# Patient Record
Sex: Female | Born: 1935
Health system: Southern US, Community
[De-identification: ages and names within clinical notes are randomized; demographics above are authoritative.]

## PROBLEM LIST (undated history)

## (undated) DIAGNOSIS — I2510 Atherosclerotic heart disease of native coronary artery without angina pectoris: Secondary | ICD-10-CM

## (undated) DIAGNOSIS — E785 Hyperlipidemia, unspecified: Secondary | ICD-10-CM

## (undated) DIAGNOSIS — I480 Paroxysmal atrial fibrillation: Secondary | ICD-10-CM

## (undated) DIAGNOSIS — E039 Hypothyroidism, unspecified: Secondary | ICD-10-CM

## (undated) DIAGNOSIS — I495 Sick sinus syndrome: Secondary | ICD-10-CM

## (undated) DIAGNOSIS — I10 Essential (primary) hypertension: Secondary | ICD-10-CM

## (undated) DIAGNOSIS — R42 Dizziness and giddiness: Secondary | ICD-10-CM

## (undated) HISTORY — DX: Essential (primary) hypertension: I10

## (undated) HISTORY — DX: Hyperlipidemia, unspecified: E78.5

## (undated) HISTORY — DX: Dizziness and giddiness: R42

## (undated) HISTORY — DX: Hypothyroidism, unspecified: E03.9

## (undated) HISTORY — DX: Paroxysmal atrial fibrillation: I48.0

## (undated) HISTORY — DX: Atherosclerotic heart disease of native coronary artery without angina pectoris: I25.10

## (undated) HISTORY — DX: Sick sinus syndrome: I49.5

## (undated) HISTORY — PX: ABDOMINAL HYSTERECTOMY: SHX81

## (undated) HISTORY — PX: FRACTURE SURGERY: SHX138

## (undated) HISTORY — DX: Hypercalcemia: E83.52

---

## 2002-11-11 ENCOUNTER — Inpatient Hospital Stay (HOSPITAL_COMMUNITY): Admission: AD | Admit: 2002-11-11 | Discharge: 2002-11-13 | Payer: Self-pay | Admitting: Internal Medicine

## 2003-05-22 ENCOUNTER — Inpatient Hospital Stay (HOSPITAL_COMMUNITY): Admission: EM | Admit: 2003-05-22 | Discharge: 2003-05-25 | Payer: Self-pay | Admitting: Internal Medicine

## 2003-05-23 ENCOUNTER — Encounter: Payer: Self-pay | Admitting: Internal Medicine

## 2003-05-24 ENCOUNTER — Encounter: Payer: Self-pay | Admitting: Cardiology

## 2003-05-24 HISTORY — PX: PACEMAKER INSERTION: SHX728

## 2003-05-25 ENCOUNTER — Encounter: Payer: Self-pay | Admitting: Cardiology

## 2004-08-13 ENCOUNTER — Ambulatory Visit: Payer: Self-pay | Admitting: Cardiology

## 2004-08-21 ENCOUNTER — Ambulatory Visit: Payer: Self-pay | Admitting: Internal Medicine

## 2004-09-18 ENCOUNTER — Ambulatory Visit: Payer: Self-pay | Admitting: Cardiology

## 2004-10-16 ENCOUNTER — Ambulatory Visit: Payer: Self-pay | Admitting: Cardiology

## 2004-11-13 ENCOUNTER — Ambulatory Visit: Payer: Self-pay | Admitting: Internal Medicine

## 2004-11-18 ENCOUNTER — Ambulatory Visit: Payer: Self-pay | Admitting: Internal Medicine

## 2004-12-11 ENCOUNTER — Ambulatory Visit: Payer: Self-pay | Admitting: Cardiology

## 2005-01-19 ENCOUNTER — Ambulatory Visit: Payer: Self-pay | Admitting: Cardiology

## 2005-02-15 ENCOUNTER — Ambulatory Visit: Payer: Self-pay | Admitting: Internal Medicine

## 2005-02-17 ENCOUNTER — Ambulatory Visit: Payer: Self-pay | Admitting: Cardiology

## 2005-03-17 ENCOUNTER — Ambulatory Visit: Payer: Self-pay | Admitting: Cardiology

## 2005-04-01 ENCOUNTER — Ambulatory Visit: Payer: Self-pay | Admitting: Internal Medicine

## 2005-04-12 ENCOUNTER — Ambulatory Visit: Payer: Self-pay | Admitting: Cardiology

## 2005-05-17 ENCOUNTER — Ambulatory Visit: Payer: Self-pay

## 2005-05-18 ENCOUNTER — Ambulatory Visit: Payer: Self-pay | Admitting: Cardiology

## 2005-06-17 ENCOUNTER — Ambulatory Visit: Payer: Self-pay | Admitting: Internal Medicine

## 2005-06-18 ENCOUNTER — Ambulatory Visit: Payer: Self-pay | Admitting: Cardiology

## 2005-06-25 ENCOUNTER — Ambulatory Visit: Payer: Self-pay | Admitting: Cardiology

## 2005-07-01 ENCOUNTER — Ambulatory Visit: Payer: Self-pay | Admitting: Cardiology

## 2005-07-23 ENCOUNTER — Ambulatory Visit: Payer: Self-pay | Admitting: Internal Medicine

## 2005-07-29 ENCOUNTER — Ambulatory Visit: Payer: Self-pay | Admitting: Cardiology

## 2005-08-05 ENCOUNTER — Ambulatory Visit: Payer: Self-pay | Admitting: Cardiology

## 2005-09-02 ENCOUNTER — Ambulatory Visit: Payer: Self-pay | Admitting: Cardiology

## 2005-09-15 ENCOUNTER — Ambulatory Visit: Payer: Self-pay | Admitting: Internal Medicine

## 2005-09-30 ENCOUNTER — Ambulatory Visit: Payer: Self-pay | Admitting: Cardiology

## 2005-10-05 ENCOUNTER — Ambulatory Visit: Payer: Self-pay | Admitting: Cardiology

## 2005-10-19 ENCOUNTER — Ambulatory Visit: Payer: Self-pay | Admitting: Cardiology

## 2005-11-16 ENCOUNTER — Ambulatory Visit: Payer: Self-pay | Admitting: Cardiology

## 2005-12-14 ENCOUNTER — Ambulatory Visit: Payer: Self-pay | Admitting: Cardiology

## 2005-12-15 ENCOUNTER — Ambulatory Visit: Payer: Self-pay | Admitting: Internal Medicine

## 2006-01-11 ENCOUNTER — Ambulatory Visit: Payer: Self-pay | Admitting: Cardiology

## 2006-01-25 ENCOUNTER — Ambulatory Visit: Payer: Self-pay | Admitting: Cardiology

## 2006-03-14 ENCOUNTER — Ambulatory Visit: Payer: Self-pay | Admitting: Internal Medicine

## 2006-03-21 ENCOUNTER — Ambulatory Visit: Payer: Self-pay | Admitting: Cardiology

## 2006-04-18 ENCOUNTER — Ambulatory Visit: Payer: Self-pay | Admitting: Cardiology

## 2006-04-28 ENCOUNTER — Ambulatory Visit: Payer: Self-pay | Admitting: Cardiology

## 2006-05-09 ENCOUNTER — Ambulatory Visit: Payer: Self-pay | Admitting: Cardiology

## 2006-05-19 ENCOUNTER — Ambulatory Visit: Payer: Self-pay | Admitting: Cardiology

## 2006-06-06 ENCOUNTER — Ambulatory Visit: Payer: Self-pay | Admitting: Cardiology

## 2006-06-20 ENCOUNTER — Ambulatory Visit: Payer: Self-pay | Admitting: Cardiology

## 2006-07-18 ENCOUNTER — Ambulatory Visit: Payer: Self-pay | Admitting: Cardiology

## 2006-07-28 ENCOUNTER — Ambulatory Visit: Payer: Self-pay | Admitting: Cardiology

## 2006-08-12 ENCOUNTER — Ambulatory Visit: Payer: Self-pay | Admitting: Internal Medicine

## 2006-08-16 ENCOUNTER — Ambulatory Visit: Payer: Self-pay | Admitting: Cardiology

## 2006-09-06 ENCOUNTER — Ambulatory Visit: Payer: Self-pay | Admitting: Cardiology

## 2006-09-09 ENCOUNTER — Ambulatory Visit: Payer: Self-pay | Admitting: Internal Medicine

## 2006-10-04 ENCOUNTER — Ambulatory Visit: Payer: Self-pay | Admitting: Cardiology

## 2006-10-07 ENCOUNTER — Ambulatory Visit: Payer: Self-pay | Admitting: Internal Medicine

## 2006-11-04 ENCOUNTER — Ambulatory Visit: Payer: Self-pay | Admitting: Internal Medicine

## 2006-11-29 ENCOUNTER — Ambulatory Visit: Payer: Self-pay | Admitting: Cardiology

## 2006-12-01 ENCOUNTER — Ambulatory Visit: Payer: Self-pay | Admitting: Cardiology

## 2006-12-02 ENCOUNTER — Ambulatory Visit: Payer: Self-pay | Admitting: Internal Medicine

## 2006-12-19 ENCOUNTER — Ambulatory Visit: Payer: Self-pay | Admitting: Cardiology

## 2006-12-30 ENCOUNTER — Ambulatory Visit: Payer: Self-pay | Admitting: Internal Medicine

## 2007-01-13 ENCOUNTER — Ambulatory Visit: Payer: Self-pay | Admitting: Internal Medicine

## 2007-01-19 ENCOUNTER — Ambulatory Visit: Payer: Self-pay | Admitting: Cardiology

## 2007-01-27 ENCOUNTER — Ambulatory Visit: Payer: Self-pay | Admitting: Internal Medicine

## 2007-01-31 ENCOUNTER — Ambulatory Visit: Payer: Self-pay | Admitting: Cardiology

## 2007-02-06 ENCOUNTER — Ambulatory Visit: Payer: Self-pay | Admitting: Cardiology

## 2007-02-24 ENCOUNTER — Ambulatory Visit: Payer: Self-pay | Admitting: Internal Medicine

## 2007-03-08 ENCOUNTER — Ambulatory Visit: Payer: Self-pay | Admitting: Cardiology

## 2007-03-15 ENCOUNTER — Ambulatory Visit: Payer: Self-pay | Admitting: Physician Assistant

## 2007-03-20 ENCOUNTER — Ambulatory Visit: Payer: Self-pay | Admitting: Cardiology

## 2007-03-24 ENCOUNTER — Ambulatory Visit: Payer: Self-pay | Admitting: Internal Medicine

## 2007-03-29 ENCOUNTER — Ambulatory Visit: Payer: Self-pay | Admitting: Cardiology

## 2007-04-04 ENCOUNTER — Ambulatory Visit: Payer: Self-pay | Admitting: Cardiology

## 2007-04-11 ENCOUNTER — Ambulatory Visit: Payer: Self-pay | Admitting: Cardiology

## 2007-04-21 ENCOUNTER — Ambulatory Visit: Payer: Self-pay | Admitting: Internal Medicine

## 2007-04-26 ENCOUNTER — Ambulatory Visit: Payer: Self-pay | Admitting: Cardiology

## 2007-05-17 ENCOUNTER — Ambulatory Visit: Payer: Self-pay | Admitting: Cardiology

## 2007-05-19 ENCOUNTER — Ambulatory Visit: Payer: Self-pay | Admitting: Internal Medicine

## 2007-06-16 ENCOUNTER — Ambulatory Visit: Payer: Self-pay | Admitting: Internal Medicine

## 2007-06-20 ENCOUNTER — Ambulatory Visit: Payer: Self-pay | Admitting: Cardiology

## 2007-07-10 ENCOUNTER — Ambulatory Visit: Payer: Self-pay | Admitting: Internal Medicine

## 2007-07-14 ENCOUNTER — Ambulatory Visit: Payer: Self-pay | Admitting: Internal Medicine

## 2007-07-18 ENCOUNTER — Ambulatory Visit: Payer: Self-pay | Admitting: Cardiology

## 2007-07-26 ENCOUNTER — Ambulatory Visit: Payer: Self-pay | Admitting: Cardiology

## 2007-08-11 ENCOUNTER — Ambulatory Visit: Payer: Self-pay | Admitting: Internal Medicine

## 2007-08-16 ENCOUNTER — Ambulatory Visit: Payer: Self-pay | Admitting: Cardiology

## 2007-08-21 ENCOUNTER — Ambulatory Visit: Payer: Self-pay | Admitting: Cardiology

## 2007-09-08 ENCOUNTER — Ambulatory Visit: Payer: Self-pay | Admitting: Internal Medicine

## 2007-10-03 ENCOUNTER — Ambulatory Visit: Payer: Self-pay | Admitting: Cardiology

## 2007-10-06 ENCOUNTER — Ambulatory Visit: Payer: Self-pay | Admitting: Internal Medicine

## 2007-10-12 ENCOUNTER — Ambulatory Visit: Payer: Self-pay | Admitting: Cardiology

## 2007-10-16 ENCOUNTER — Ambulatory Visit: Payer: Self-pay | Admitting: Cardiology

## 2007-10-20 ENCOUNTER — Ambulatory Visit: Payer: Self-pay | Admitting: Cardiology

## 2007-11-23 ENCOUNTER — Ambulatory Visit: Payer: Self-pay | Admitting: Cardiology

## 2007-11-24 ENCOUNTER — Encounter: Payer: Self-pay | Admitting: Cardiology

## 2007-11-30 ENCOUNTER — Ambulatory Visit: Payer: Self-pay | Admitting: Cardiology

## 2007-12-15 ENCOUNTER — Ambulatory Visit: Payer: Self-pay | Admitting: Cardiology

## 2007-12-28 ENCOUNTER — Ambulatory Visit: Payer: Self-pay | Admitting: Cardiology

## 2008-01-10 ENCOUNTER — Ambulatory Visit: Payer: Self-pay | Admitting: Internal Medicine

## 2008-01-18 ENCOUNTER — Ambulatory Visit: Payer: Self-pay | Admitting: Internal Medicine

## 2008-01-24 ENCOUNTER — Ambulatory Visit: Payer: Self-pay | Admitting: Cardiology

## 2008-02-28 ENCOUNTER — Ambulatory Visit: Payer: Self-pay | Admitting: Cardiology

## 2008-04-02 ENCOUNTER — Ambulatory Visit: Payer: Self-pay | Admitting: Cardiology

## 2008-04-05 ENCOUNTER — Ambulatory Visit: Payer: Self-pay | Admitting: Internal Medicine

## 2008-04-29 ENCOUNTER — Encounter: Payer: Self-pay | Admitting: Cardiology

## 2008-04-30 ENCOUNTER — Ambulatory Visit: Payer: Self-pay | Admitting: Cardiology

## 2008-05-29 ENCOUNTER — Ambulatory Visit: Payer: Self-pay | Admitting: Cardiology

## 2008-06-25 ENCOUNTER — Ambulatory Visit: Payer: Self-pay | Admitting: Cardiology

## 2008-07-05 ENCOUNTER — Ambulatory Visit: Payer: Self-pay | Admitting: Internal Medicine

## 2008-07-05 ENCOUNTER — Encounter: Payer: Self-pay | Admitting: Physician Assistant

## 2008-07-05 ENCOUNTER — Ambulatory Visit: Payer: Self-pay | Admitting: Cardiology

## 2008-07-11 DIAGNOSIS — I4891 Unspecified atrial fibrillation: Secondary | ICD-10-CM

## 2008-07-11 DIAGNOSIS — I10 Essential (primary) hypertension: Secondary | ICD-10-CM

## 2008-07-11 DIAGNOSIS — I2510 Atherosclerotic heart disease of native coronary artery without angina pectoris: Secondary | ICD-10-CM | POA: Insufficient documentation

## 2008-07-11 DIAGNOSIS — E785 Hyperlipidemia, unspecified: Secondary | ICD-10-CM

## 2008-07-16 ENCOUNTER — Ambulatory Visit: Payer: Self-pay | Admitting: Cardiology

## 2008-08-06 ENCOUNTER — Ambulatory Visit: Payer: Self-pay | Admitting: Cardiology

## 2008-08-16 ENCOUNTER — Ambulatory Visit: Payer: Self-pay | Admitting: Internal Medicine

## 2008-08-27 ENCOUNTER — Ambulatory Visit: Payer: Self-pay | Admitting: Cardiology

## 2008-09-24 ENCOUNTER — Ambulatory Visit: Payer: Self-pay | Admitting: Cardiology

## 2008-10-04 ENCOUNTER — Ambulatory Visit: Payer: Self-pay | Admitting: Internal Medicine

## 2008-10-14 ENCOUNTER — Encounter: Payer: Self-pay | Admitting: Cardiology

## 2008-10-25 ENCOUNTER — Ambulatory Visit: Payer: Self-pay | Admitting: Cardiology

## 2008-11-07 ENCOUNTER — Encounter: Payer: Self-pay | Admitting: Cardiology

## 2008-11-19 ENCOUNTER — Ambulatory Visit: Payer: Self-pay | Admitting: Cardiology

## 2008-12-24 ENCOUNTER — Ambulatory Visit: Payer: Self-pay | Admitting: Cardiology

## 2009-01-03 ENCOUNTER — Ambulatory Visit: Payer: Self-pay | Admitting: Internal Medicine

## 2009-01-03 ENCOUNTER — Encounter (INDEPENDENT_AMBULATORY_CARE_PROVIDER_SITE_OTHER): Payer: Self-pay | Admitting: Radiology

## 2009-01-21 ENCOUNTER — Ambulatory Visit: Payer: Self-pay | Admitting: Cardiology

## 2009-02-07 ENCOUNTER — Encounter: Payer: Self-pay | Admitting: Cardiology

## 2009-02-08 ENCOUNTER — Encounter: Payer: Self-pay | Admitting: Cardiology

## 2009-02-18 ENCOUNTER — Ambulatory Visit: Payer: Self-pay | Admitting: Cardiology

## 2009-02-28 ENCOUNTER — Ambulatory Visit: Payer: Self-pay | Admitting: Internal Medicine

## 2009-03-18 ENCOUNTER — Ambulatory Visit: Payer: Self-pay | Admitting: Cardiology

## 2009-03-19 ENCOUNTER — Encounter: Payer: Self-pay | Admitting: Cardiology

## 2009-04-04 ENCOUNTER — Encounter: Payer: Self-pay | Admitting: Cardiology

## 2009-04-04 ENCOUNTER — Encounter: Payer: Self-pay | Admitting: Internal Medicine

## 2009-04-04 ENCOUNTER — Ambulatory Visit: Payer: Self-pay | Admitting: Internal Medicine

## 2009-04-15 ENCOUNTER — Ambulatory Visit: Payer: Self-pay

## 2009-05-12 ENCOUNTER — Encounter: Payer: Self-pay | Admitting: Cardiology

## 2009-05-12 ENCOUNTER — Encounter: Payer: Self-pay | Admitting: *Deleted

## 2009-05-13 ENCOUNTER — Ambulatory Visit: Payer: Self-pay | Admitting: Cardiology

## 2009-05-13 LAB — CONVERTED CEMR LAB
POC INR: 2.3
Prothrombin Time: 18.4 s

## 2009-06-10 ENCOUNTER — Ambulatory Visit: Payer: Self-pay | Admitting: Cardiology

## 2009-07-04 ENCOUNTER — Ambulatory Visit: Payer: Self-pay | Admitting: Internal Medicine

## 2009-07-08 ENCOUNTER — Ambulatory Visit: Payer: Self-pay | Admitting: Cardiology

## 2009-07-17 ENCOUNTER — Encounter: Payer: Self-pay | Admitting: Cardiology

## 2009-07-18 ENCOUNTER — Encounter: Payer: Self-pay | Admitting: Cardiology

## 2009-08-05 ENCOUNTER — Ambulatory Visit: Payer: Self-pay | Admitting: Cardiology

## 2009-08-12 ENCOUNTER — Ambulatory Visit: Payer: Self-pay | Admitting: Cardiology

## 2009-08-12 ENCOUNTER — Encounter (INDEPENDENT_AMBULATORY_CARE_PROVIDER_SITE_OTHER): Payer: Self-pay | Admitting: *Deleted

## 2009-08-12 DIAGNOSIS — I495 Sick sinus syndrome: Secondary | ICD-10-CM | POA: Insufficient documentation

## 2009-08-12 DIAGNOSIS — R0989 Other specified symptoms and signs involving the circulatory and respiratory systems: Secondary | ICD-10-CM

## 2009-08-12 DIAGNOSIS — E039 Hypothyroidism, unspecified: Secondary | ICD-10-CM | POA: Insufficient documentation

## 2009-08-12 DIAGNOSIS — Z950 Presence of cardiac pacemaker: Secondary | ICD-10-CM

## 2009-08-12 DIAGNOSIS — R0609 Other forms of dyspnea: Secondary | ICD-10-CM

## 2009-08-18 ENCOUNTER — Encounter: Payer: Self-pay | Admitting: Cardiology

## 2009-08-18 ENCOUNTER — Ambulatory Visit: Payer: Self-pay | Admitting: Cardiology

## 2009-08-26 ENCOUNTER — Encounter (INDEPENDENT_AMBULATORY_CARE_PROVIDER_SITE_OTHER): Payer: Self-pay | Admitting: *Deleted

## 2009-09-02 ENCOUNTER — Ambulatory Visit: Payer: Self-pay | Admitting: Cardiology

## 2009-09-02 LAB — CONVERTED CEMR LAB: POC INR: 2.2

## 2009-09-23 ENCOUNTER — Ambulatory Visit: Payer: Self-pay | Admitting: Cardiology

## 2009-09-25 ENCOUNTER — Encounter: Payer: Self-pay | Admitting: Cardiology

## 2009-09-30 ENCOUNTER — Ambulatory Visit: Payer: Self-pay | Admitting: Cardiology

## 2009-09-30 LAB — CONVERTED CEMR LAB: POC INR: 2

## 2009-10-01 ENCOUNTER — Ambulatory Visit: Payer: Self-pay | Admitting: Internal Medicine

## 2009-10-28 ENCOUNTER — Ambulatory Visit: Payer: Self-pay | Admitting: Cardiology

## 2009-10-28 LAB — CONVERTED CEMR LAB: POC INR: 2.4

## 2009-11-25 ENCOUNTER — Ambulatory Visit: Payer: Self-pay | Admitting: Cardiology

## 2009-12-02 ENCOUNTER — Encounter: Payer: Self-pay | Admitting: Cardiology

## 2009-12-23 ENCOUNTER — Ambulatory Visit: Payer: Self-pay | Admitting: Cardiology

## 2009-12-23 LAB — CONVERTED CEMR LAB: POC INR: 2.5

## 2010-01-02 ENCOUNTER — Ambulatory Visit: Payer: Self-pay | Admitting: Internal Medicine

## 2010-01-20 ENCOUNTER — Encounter: Payer: Self-pay | Admitting: Cardiology

## 2010-01-23 ENCOUNTER — Ambulatory Visit: Payer: Self-pay | Admitting: Cardiology

## 2010-02-16 ENCOUNTER — Encounter: Payer: Self-pay | Admitting: Cardiology

## 2010-02-20 ENCOUNTER — Encounter: Payer: Self-pay | Admitting: Cardiology

## 2010-02-24 ENCOUNTER — Ambulatory Visit: Payer: Self-pay | Admitting: Cardiology

## 2010-03-04 ENCOUNTER — Ambulatory Visit: Payer: Self-pay | Admitting: Cardiology

## 2010-03-06 ENCOUNTER — Encounter: Payer: Self-pay | Admitting: Cardiology

## 2010-03-24 ENCOUNTER — Ambulatory Visit: Payer: Self-pay | Admitting: Cardiology

## 2010-03-24 LAB — CONVERTED CEMR LAB: POC INR: 2.4

## 2010-04-21 ENCOUNTER — Encounter: Payer: Self-pay | Admitting: Cardiology

## 2010-04-24 ENCOUNTER — Ambulatory Visit: Payer: Self-pay | Admitting: Cardiology

## 2010-04-27 ENCOUNTER — Ambulatory Visit: Payer: Self-pay | Admitting: Internal Medicine

## 2010-04-28 ENCOUNTER — Encounter: Payer: Self-pay | Admitting: Internal Medicine

## 2010-05-22 ENCOUNTER — Ambulatory Visit: Payer: Self-pay | Admitting: Cardiology

## 2010-06-10 ENCOUNTER — Encounter (INDEPENDENT_AMBULATORY_CARE_PROVIDER_SITE_OTHER): Payer: Self-pay | Admitting: *Deleted

## 2010-06-19 ENCOUNTER — Ambulatory Visit: Payer: Self-pay | Admitting: Cardiology

## 2010-06-26 ENCOUNTER — Ambulatory Visit: Payer: Self-pay | Admitting: Internal Medicine

## 2010-06-27 ENCOUNTER — Encounter: Payer: Self-pay | Admitting: Cardiology

## 2010-06-28 ENCOUNTER — Encounter: Payer: Self-pay | Admitting: Cardiology

## 2010-07-21 ENCOUNTER — Ambulatory Visit: Payer: Self-pay | Admitting: Cardiology

## 2010-07-21 LAB — CONVERTED CEMR LAB: POC INR: 2.6

## 2010-08-18 ENCOUNTER — Ambulatory Visit: Payer: Self-pay | Admitting: Cardiology

## 2010-08-31 ENCOUNTER — Ambulatory Visit: Payer: Self-pay | Admitting: Internal Medicine

## 2010-09-15 ENCOUNTER — Ambulatory Visit: Payer: Self-pay | Admitting: Cardiology

## 2010-09-15 LAB — CONVERTED CEMR LAB: POC INR: 2.9

## 2010-10-06 ENCOUNTER — Ambulatory Visit: Admission: RE | Admit: 2010-10-06 | Discharge: 2010-10-06 | Payer: Self-pay | Source: Home / Self Care

## 2010-10-06 ENCOUNTER — Encounter: Payer: Self-pay | Admitting: Cardiology

## 2010-10-06 ENCOUNTER — Ambulatory Visit
Admission: RE | Admit: 2010-10-06 | Discharge: 2010-10-06 | Payer: Self-pay | Source: Home / Self Care | Attending: Cardiology | Admitting: Cardiology

## 2010-10-07 ENCOUNTER — Encounter: Payer: Self-pay | Admitting: Cardiology

## 2010-10-16 ENCOUNTER — Encounter (INDEPENDENT_AMBULATORY_CARE_PROVIDER_SITE_OTHER): Payer: Self-pay | Admitting: *Deleted

## 2010-10-27 NOTE — Medication Information (Signed)
Summary: ccr-lr  Anticoagulant Therapy  Managed by: Vashti Hey, RN PCP: Lucianne Muss MD: Andee Lineman MD, Michelle Piper Indication 1: Atrial Fibrillation (ICD-427.31) Lab [facility] [place] Site: [facility] INR POC 2.5  Dietary changes: no    Health status changes: no    Bleeding/hemorrhagic complications: no    Recent/future hospitalizations: no    Any changes in medication regimen? no    Recent/future dental: no  Any missed doses?: no       Is patient compliant with meds? yes       Allergies: 1)  ! * Statins  Anticoagulation Management History:      The patient is taking warfarin and comes in today for a routine follow up visit.  Positive risk factors for bleeding include an age of 75 years or older.  The bleeding index is 'intermediate risk'.  Positive CHADS2 values include History of HTN and Age > 75 years old.  The start date was 02/17/2005.  Anticoagulation responsible provider: Andee Lineman MD, Michelle Piper.  INR POC: 2.5.  Cuvette Lot#: [id number].  Exp: 10/11.    Anticoagulation Management Assessment/Plan:      The patient's current anticoagulation dose is Warfarin sodium 2.5 mg tabs: Take 1 tablet by mouth as directed.  The target INR is 2 - 3.  The next INR is due 01/20/2010.  Anticoagulation instructions were given to patient.  Results were reviewed/authorized by Vashti Hey, RN.  She was notified by Vashti Hey RN.         Prior Anticoagulation Instructions: INR 2.1 Continue coumadin 2.5mg  once daily except 1.25mg  on Thursdays and Saturdays  Current Anticoagulation Instructions: INR 2.5 Continue coumadin 2.5mg  once daily except 1.25mg  on Thursdays and Saturdays

## 2010-10-27 NOTE — Medication Information (Signed)
Summary: CCR-R/S NO SHOW  Anticoagulant Therapy  Managed by: Vashti Hey, RN PCP: Lucianne Muss MD: Diona Browner MD, Remi Deter Indication 1: Atrial Fibrillation (ICD-427.31) Lab [facility] [place] Site: [facility] INR POC 2.4  Dietary changes: no    Health status changes: no    Bleeding/hemorrhagic complications: no    Recent/future hospitalizations: no    Any changes in medication regimen? no    Recent/future dental: no  Any missed doses?: no       Is patient compliant with meds? yes       Allergies: 1)  ! * Statins  Anticoagulation Management History:      The patient is taking warfarin and comes in today for a routine follow up visit.  Positive risk factors for bleeding include an age of 75 years or older.  The bleeding index is 'intermediate risk'.  Positive CHADS2 values include History of HTN and Age > 75 years old.  The start date was 02/17/2005.  Anticoagulation responsible provider: Diona Browner MD, Remi Deter.  INR POC: 2.4.  Cuvette Lot#: [id number].  Exp: 10/11.    Anticoagulation Management Assessment/Plan:      The patient's current anticoagulation dose is Warfarin sodium 2.5 mg tabs: Take 1 tablet by mouth as directed.  The target INR is 2 - 3.  The next INR is due 05/22/2010.  Anticoagulation instructions were given to patient.  Results were reviewed/authorized by Vashti Hey, RN.  She was notified by Vashti Hey RN.         Prior Anticoagulation Instructions: INR 2.4 Continue coumadin 2.5mg  once daily except 1.25mg  on Thursdays and Saturdays  Current Anticoagulation Instructions: INR 2.4 Continue couamdin 2.5mg  once daily except 1.25mg  on Th, Sat

## 2010-10-27 NOTE — Cardiovascular Report (Signed)
Summary: [facility]/ INTERROGATION REPORT  [facility]/ INTERROGATION REPORT   Imported By: Dorise Hiss 06/29/2010 11:56:00  _____________________________________________________________________  External Attachment:    Type:   Image     Comment:   External Document

## 2010-10-27 NOTE — Cardiovascular Report (Signed)
Summary: TTM   TTM   Imported By: Roderic Ovens 01/14/2010 13:08:56  _____________________________________________________________________  External Attachment:    Type:   Image     Comment:   External Document

## 2010-10-27 NOTE — Medication Information (Signed)
Summary: ccr-lr  Anticoagulant Therapy  Managed by: Vashti Hey, RN PCP: Lucianne Muss MD: Antoine Poche MD, Fayrene Fearing Indication 1: Atrial Fibrillation (ICD-427.31) Lab Used: [facility] [place] Site: [facility] INR POC 2.3  Dietary changes: no    Health status changes: no    Bleeding/hemorrhagic complications: no    Recent/future hospitalizations: no    Any changes in medication regimen? no    Recent/future dental: no  Any missed doses?: no       Is patient compliant with meds? yes       Allergies: 1)  ! * Statins  Anticoagulation Management History:      The patient is taking warfarin and comes in today for a routine follow up visit.  Positive risk factors for bleeding include an age of 75 years or older.  The bleeding index is 'intermediate risk'.  Positive CHADS2 values include History of HTN and Age > 53 years old.  The start date was 02/17/2005.  Anticoagulation responsible provider: Antoine Poche MD, Fayrene Fearing.  INR POC: 2.3.  Cuvette Lot#: [id number].  Exp: 10/11.    Anticoagulation Management Assessment/Plan:      The patient's current anticoagulation dose is Warfarin sodium 2.5 mg tabs: Take 1 tablet by mouth as directed.  The target INR is 2 - 3.  The next INR is due 03/24/2010.  Anticoagulation instructions were given to patient.  Results were reviewed/authorized by Vashti Hey, RN.  She was notified by Vashti Hey RN.         Prior Anticoagulation Instructions: INR 2.2 Continue coumadin 2.5mg  once daily except 1.25mg  on Thursdays and Saturdays  Current Anticoagulation Instructions: INR 2.3 Continue coumadin 2.5mg  once daily except 1.25mg  on Thursdays and Saturdays

## 2010-10-27 NOTE — Letter (Signed)
Summary: Appointment- Rescheduled  [facility] [address]   [place], [place] [address]   Phone: [phone]  Fax: [phone]     June 10, 2010 MRN: [id number]     Melanie [name] [address] [place], [place]  [address]     Dear Melanie [name],   Due to a change in our office schedule, your appointment time on   Sept 23, 2011 at 11:15 must be changed to Sept 23, 2011 at 3:45.   We look forward to participating in your health care needs.      Sincerely,  Glass blower/designer

## 2010-10-27 NOTE — Medication Information (Signed)
Summary: ccr-lr  Anticoagulant Therapy  Managed by: Vashti Hey, RN PCP: Lucianne Muss MD: Andee Lineman MD, Michelle Piper Indication 1: Atrial Fibrillation (ICD-427.31) Lab Used: [facility] [place] Site: [facility] INR POC 2.4  Dietary changes: no    Health status changes: no    Bleeding/hemorrhagic complications: no    Recent/future hospitalizations: no    Any changes in medication regimen? no    Recent/future dental: no  Any missed doses?: no       Is patient compliant with meds? yes       Allergies: 1)  ! * Statins  Anticoagulation Management History:      The patient is taking warfarin and comes in today for a routine follow up visit.  Positive risk factors for bleeding include an age of 75 years or older.  The bleeding index is 'intermediate risk'.  Positive CHADS2 values include History of HTN and Age > 25 years old.  The start date was 02/17/2005.  Anticoagulation responsible provider: Andee Lineman MD, Michelle Piper.  INR POC: 2.4.  Cuvette Lot#: [id number].  Exp: 10/11.    Anticoagulation Management Assessment/Plan:      The patient's current anticoagulation dose is Warfarin sodium 2.5 mg tabs: Take 1 tablet by mouth as directed.  The target INR is 2 - 3.  The next INR is due 11/25/2009.  Anticoagulation instructions were given to patient.  Results were reviewed/authorized by Vashti Hey, RN.  She was notified by Vashti Hey RN.         Prior Anticoagulation Instructions: INR 2.0 Take coumadin 3.75mg  tonight then resume 2.5mg  once daily except 1.25mg  on Th,Sat  Current Anticoagulation Instructions: INR 2.4 Continue coumadin 2.5mg  once daily except 1.25mg  on Th,Sat

## 2010-10-27 NOTE — Cardiovascular Report (Signed)
Summary: TTM   TTM   Imported By: Roderic Ovens 05/19/2010 11:21:05  _____________________________________________________________________  External Attachment:    Type:   Image     Comment:   External Document

## 2010-10-27 NOTE — Medication Information (Signed)
Summary: ccr-lr  Anticoagulant Therapy  Managed by: Vashti Hey, RN PCP: Lucianne Muss MD: Andee Lineman MD, Michelle Piper Indication 1: Atrial Fibrillation (ICD-427.31) Lab [facility]: [facility] [place] Site: [facility] INR POC 2.1  Dietary changes: no    Health status changes: yes       Details: stomach virus  Bleeding/hemorrhagic complications: no    Recent/future hospitalizations: no    Any changes in medication regimen? yes       Details: lomotil and phenergan for stomach virus  Recent/future dental: no  Any missed doses?: no       Is patient compliant with meds? yes       Allergies: 1)  ! * Statins  Anticoagulation Management History:      The patient is taking warfarin and comes in today for a routine follow up visit.  Positive risk factors for bleeding include an age of 30 years or older.  The bleeding index is 'intermediate risk'.  Positive CHADS2 values include History of HTN and Age > 56 years old.  The start date was 02/17/2005.  Anticoagulation responsible provider: Andee Lineman MD, Michelle Piper.  INR POC: 2.1.  Cuvette Lot#: [id number].  Exp: 10/11.    Anticoagulation Management Assessment/Plan:      The patient's current anticoagulation dose is Warfarin sodium 2.5 mg tabs: Take 1 tablet by mouth as directed.  The target INR is 2 - 3.  The next INR is due 12/23/2009.  Anticoagulation instructions were given to patient.  Results were reviewed/authorized by Vashti Hey, RN.  She was notified by Vashti Hey RN.         Prior Anticoagulation Instructions: INR 2.4 Continue coumadin 2.5mg  once daily except 1.25mg  on Th,Sat  Current Anticoagulation Instructions: INR 2.1 Continue coumadin 2.5mg  once daily except 1.25mg  on Thursdays and Saturdays

## 2010-10-27 NOTE — Assessment & Plan Note (Signed)
Summary: 6 mo fu reminder-srs   Visit Type:  Follow-up Primary Provider:  Sherril Croon   History of Present Illness: the patient is an 77 year old female with history of coronary artery disease. She underwent a prior evaluation for dyspnea. She had a stress test would notice for ischemia. Also pulmonary function tests were relatively normal with a slight decrease in DLCO likely secondary to amiodarone. Interestingly the patient's dyspnea has markedly improved. She reports no chest pain orthopnea PND. She was found recently to have a cyst on her kidney. She also the CT scan of the neck for soft tissue mass but no definite malignancy was found. CT Scan of the chest showed some mild scarring in the lung tissue. Overall she states that from a cardiovascular standpoint is doing quite well. She also has a pacemaker and reports no complications.  Preventive Screening-Counseling & Management  Alcohol-Tobacco     Smoking Status: never  Current Medications (verified): 1)  Amiodarone Hcl 200 Mg Tabs (Amiodarone Hcl) .... Take 1/2 Tablet By Mouth Once A Day 2)  Amlodipine Besylate 10 Mg Tabs (Amlodipine Besylate) .... Take 1/2 Tablet By Mouth Once A Day 3)  Aspirin 81 Mg Tabs (Aspirin) .... Take 1 Tablet By Mouth Once A Day 4)  Crestor 5 Mg Tabs (Rosuvastatin Calcium) .... Take 1 Tablet By Mouth Every Night 5)  Isosorbide Mononitrate Cr 30 Mg Xr24h-Tab (Isosorbide Mononitrate) .... Take 1/2 Tablet By Mouth At Bedtime 6)  Levothyroxine Sodium 75 Mcg Tabs (Levothyroxine Sodium) .... Take 1 Tablet By Mouth Once A Day 7)  Warfarin Sodium 2.5 Mg Tabs (Warfarin Sodium) .... Take 1 Tablet By Mouth As Directed 8)  Zetia 10 Mg Tabs (Ezetimibe) .... Take 1 Tablet By Mouth Once A Day 9)  Metoprolol Tartrate 25 Mg Tabs (Metoprolol Tartrate) .... Take 1 Tablet By Mouth Two Times A Day 10)  Protonix 40 Mg Tbec (Pantoprazole Sodium) .... Take 1 Tablet By Mouth Once A Day 11)  Vitamin B-12 250 Mcg Tabs (Cyanocobalamin) ....  Take 1 Tablet By Mouth Once A Day 12)  Fish Oil 1000 Mg Caps (Omega-3 Fatty Acids) .... Take 1 Tablet By Mouth Two Times A Day 13)  Evista 60 Mg Tabs (Raloxifene Hcl) .... Take 1 Tablet By Mouth Once A Day 14)  Clonazepam 0.5 Mg Tabs (Clonazepam) .... Take 1 Tablet By Mouth As Needed Sleep  Allergies: 1)  ! * Statins  Comments:  Nurse/Medical Assistant: The patient's medications were reviewed with the patient and were updated in the Medication List. Pt brought medication bottles to office visit.  Cyril Loosen, RN, BSN (March 04, 2010 1:38 PM)  Past History:  Past Medical History: Last updated: 08/12/2009 1. Dizziness, resolved. 2. Coronary artery disease. 3. History of paroxysmal atrial fibrillation in normal sinus rhythm on     amiodarone with atrial pacing. 4. Sinus node dysfunction status post Medtronic dual-chamber     pacemaker. 5. History of hypercalcemia resolved post discontinuation of     supplemental calcium. 6. Hypertension. 7. Dyslipidemia.   1. Two-vessel coronary artery disease, quiescent.     a.     70% left anterior descending artery disease and well      collateralized, 100% right coronary artery by cardiac      catheterization in 2004.     b.     Low-risk adenosine Cardiolite with noted ischemia in small      area of mid inferior wall; ejection fraction 70%, October 2008. 2. Normal ventricular function. 3. History  of paroxysmal atrial fibrillation.     a.     Maintaining his normal sinus rhythm, on amiodarone.     b.     Chronic Coumadin. 4. Sinus node dysfunction.     a.     Status post Medtronic dual-chamber pacemaker, followed by      Dr. [name]. 5. Hypertension, stable. 6. Dyslipidemia, well controlled. 7. Hypothyroidism.   Family History: Last updated: 08/12/2009 noncontributory  Social History: Last updated: 08/12/2009 Tobacco Use - No.   Risk Factors: Smoking Status: never (03/04/2010)  Past Cardiac History:  Recent  Diagnostics: IMPRESSION: 1. Paroxysmal atrial fibrillation. 2. Sinus node dysfunction, status post pacemaker implantation. 3. Ischemic heart disease with prior percutaneous coronary     intervention and normal left ventricular function. 4. Dyspnea on exertion - recently worsening, question anginal     equivalent. 5. Amiodarone for the above.   Ms. Pistole pacemaker is functioning normally.  I am a little worried about her dyspnea on exertion, if it continues to be a problem, I have asked her to let us know and we may need to undertake Myoview scanning.   In addition, it is possible that reprogramming of her device might ameliorate some of these symptoms as well.   I trust that she will be getting her amiodarone, surveillance laboratories, and thyroid adjustments by Dr. Sherril Croon.   We will see her again in 6 months' time.  Review of Systems  The patient denies fatigue, malaise, fever, weight gain/loss, vision loss, decreased hearing, hoarseness, chest pain, palpitations, shortness of breath, prolonged cough, wheezing, sleep apnea, coughing up blood, abdominal pain, blood in stool, nausea, vomiting, diarrhea, heartburn, incontinence, blood in urine, muscle weakness, joint pain, leg swelling, rash, skin lesions, headache, fainting, dizziness, depression, anxiety, enlarged lymph nodes, easy bruising or bleeding, and environmental allergies.    Vital Signs:  Patient profile:   77 year old female Height:      63 inches Weight:      160 pounds Pulse rate:   71 / minute BP sitting:   145 / 76  (left arm) Cuff size:   regular  Vitals Entered By: Cyril Loosen, RN, BSN (March 04, 2010 1:33 PM) Comments Follow up office visit   Physical Exam  Additional Exam:  General: Well-developed, well-nourished in no distress head: Normocephalic and atraumatic eyes PERRLA/EOMI intact, conjunctiva and lids normal nose: No deformity or lesions mouth normal dentition, normal posterior pharynx neck:  Supple, no JVD.  No masses, thyromegaly or abnormal cervical nodes lungs: Normal breath sounds bilaterally without wheezing.  Normal percussion heart: regular rate and rhythm with normal S1 and S2, no S3 or S4.  PMI is normal.  No pathological murmurs abdomen: Normal bowel sounds, abdomen is soft and nontender without masses, organomegaly or hernias noted.  No hepatosplenomegaly musculoskeletal: Back normal, normal gait muscle strength and tone normal pulsus: Pulse is normal in all 4 extremities Extremities: No peripheral pitting edema neurologic: Alert and oriented x 3 skin: Intact without lesions or rashes cervical nodes: No significant adenopathy psychologic: Normal affect    EKG  Procedure date:  03/04/2010  Findings:      atrial pacing heart rate 78 beats per minute  PPM Specifications Following MD:  Hillis Range, MD     PPM Vendor:  Medtronic     PPM Model Number:  [id number]     PPM Serial Number:  [id number] PPM DOI:  05/24/2003     PPM Implanting MD:  [name], MD  Lead 1    Location: [facility]     DOI: 05/24/2003     Model #: [id number]     Serial #: [id number]     Status: active Lead 2    Location: RV     DOI: 05/24/2003     Model #: [id number]     Serial #: [id number]     Status: active   Indications:  PAF   PPM Follow Up Pacer Dependent:  No      Episodes Coumadin:  Yes  Parameters Mode:  DDIR     Lower Rate Limit:  60     Upper Rate Limit:  105 Paced AV Delay:  300     Impression & Recommendations:  Problem # 1:  SINOATRIAL NODE DYSFUNCTION (ICD-427.81) the patient status post pacemaker placement. Normal pacemaker function. Her updated medication list for this problem includes:    Amiodarone Hcl 200 Mg Tabs (Amiodarone hcl) .Marland Kitchen... Take 1/2 tablet by mouth once a day    Amlodipine Besylate 10 Mg Tabs (Amlodipine besylate) .Marland Kitchen... Take 1/2 tablet by mouth once a day    Aspirin 81 Mg Tabs (Aspirin) .Marland Kitchen... Take 1 tablet by mouth once a day    Isosorbide Mononitrate Cr 30 Mg Xr24h-tab  (Isosorbide mononitrate) .Marland Kitchen... Take 1/2 tablet by mouth at bedtime    Warfarin Sodium 2.5 Mg Tabs (Warfarin sodium) .Marland Kitchen... Take 1 tablet by mouth as directed    Metoprolol Tartrate 25 Mg Tabs (Metoprolol tartrate) .Marland Kitchen... Take 1 tablet by mouth two times a day  Problem # 2:  DYSPNEA ON EXERTION (ICD-786.09) dyspnea has significantly improved. The patient has mild scarring of the lung tissue by CT scan. We'll continue current medical regimen. She had pulmonary function test done and they do not need to be repeated. Her updated medication list for this problem includes:    Amlodipine Besylate 10 Mg Tabs (Amlodipine besylate) .Marland Kitchen... Take 1/2 tablet by mouth once a day    Aspirin 81 Mg Tabs (Aspirin) .Marland Kitchen... Take 1 tablet by mouth once a day    Metoprolol Tartrate 25 Mg Tabs (Metoprolol tartrate) .Marland Kitchen... Take 1 tablet by mouth two times a day  Problem # 3:  ATRIAL FIBRILLATION, PAROXYSMAL (ICD-427.31) the patient remains in normal sinus rhythm on amiodarone therapy. As a matter of fact she has atrial pacing with normal ventricular conduction. Her updated medication list for this problem includes:    Amiodarone Hcl 200 Mg Tabs (Amiodarone hcl) .Marland Kitchen... Take 1/2 tablet by mouth once a day    Aspirin 81 Mg Tabs (Aspirin) .Marland Kitchen... Take 1 tablet by mouth once a day    Warfarin Sodium 2.5 Mg Tabs (Warfarin sodium) .Marland Kitchen... Take 1 tablet by mouth as directed    Metoprolol Tartrate 25 Mg Tabs (Metoprolol tartrate) .Marland Kitchen... Take 1 tablet by mouth two times a day  Orders: EKG w/ Interpretation (93000) T-TSH (29562-13086) T-Hepatic Function (57846-96295)  Problem # 4:  CAD, NATIVE VESSEL (ICD-414.01) no recurrent chest pain. Continue current medical therapy. Her updated medication list for this problem includes:    Amlodipine Besylate 10 Mg Tabs (Amlodipine besylate) .Marland Kitchen... Take 1/2 tablet by mouth once a day    Aspirin 81 Mg Tabs (Aspirin) .Marland Kitchen... Take 1 tablet by mouth once a day    Isosorbide Mononitrate Cr 30 Mg  Xr24h-tab (Isosorbide mononitrate) .Marland Kitchen... Take 1/2 tablet by mouth at bedtime    Warfarin Sodium 2.5 Mg Tabs (Warfarin sodium) .Marland Kitchen... Take 1 tablet by mouth as directed    Metoprolol Tartrate  25 Mg Tabs (Metoprolol tartrate) .Marland Kitchen... Take 1 tablet by mouth two times a day  Patient Instructions: 1)  Labs:  liver function & thyroid 2)  Follow up in  6 months

## 2010-10-27 NOTE — Medication Information (Signed)
Summary: ccr-srs  Anticoagulant Therapy  Managed by: Vashti Hey, RN PCP: Lucianne Muss MD: Andee Lineman MD, Michelle Piper Indication 1: Atrial Fibrillation (ICD-427.31) Lab [facility] [place] Site: [facility] INR POC 2.0  Dietary changes: no    Health status changes: yes       Details: had chest congestion  Bleeding/hemorrhagic complications: no    Recent/future hospitalizations: no    Any changes in medication regimen? yes       Details: was on z pack   finished 2 days ago  Recent/future dental: no  Any missed doses?: no       Is patient compliant with meds? yes       Allergies: 1)  ! * Statins  Anticoagulation Management History:      The patient is taking warfarin and comes in today for a routine follow up visit.  Positive risk factors for bleeding include an age of 75 years or older.  The bleeding index is 'intermediate risk'.  Positive CHADS2 values include History of HTN and Age > 75 years old.  The start date was 02/17/2005.  Anticoagulation responsible provider: Andee Lineman MD, Michelle Piper.  INR POC: 2.0.  Cuvette Lot#: [id number].  Exp: 10/11.    Anticoagulation Management Assessment/Plan:      The patient's current anticoagulation dose is Warfarin sodium 2.5 mg tabs: Take 1 tablet by mouth as directed.  The target INR is 2 - 3.  The next INR is due 10/28/2009.  Anticoagulation instructions were given to patient.  Results were reviewed/authorized by Vashti Hey, RN.  She was notified by Vashti Hey RN.         Prior Anticoagulation Instructions: Continue coumadin 2.5mg  once daily except 1.25mg  on Thursdays and Saturdays  Current Anticoagulation Instructions: INR 2.0 Take coumadin 3.75mg  tonight then resume 2.5mg  once daily except 1.25mg  on Th,Sat

## 2010-10-27 NOTE — Letter (Signed)
Summary: Appointment -missed  [facility] [address]   [place], [place] [address]   Phone: [phone]  Fax: [phone]     January 20, 2010 MRN: [id number]     Melanie [name] [address] [place], [place]  [address]     Dear Ms. [name],  Our records indicate you missed your appointment on January 20, 2010                        with [facility].   It is very important that we reach you to reschedule this appointment. We look forward to participating in your health care needs.   Please contact us at the number listed above at your earliest convenience to reschedule this appointment.   Sincerely,    Glass blower/designer

## 2010-10-27 NOTE — Medication Information (Signed)
Summary: ccr-lr  Anticoagulant Therapy  Managed by: Vashti Hey, RN PCP: Lucianne Muss MD: Diona Browner MD, Remi Deter Indication 1: Atrial Fibrillation (ICD-427.31) Lab [facility]: [facility] [place] Site: [facility] INR POC 2.6  Dietary changes: no    Health status changes: no    Bleeding/hemorrhagic complications: no    Recent/future hospitalizations: no    Any changes in medication regimen? no    Recent/future dental: no  Any missed doses?: no       Is patient compliant with meds? yes       Allergies: 1)  ! * Statins  Anticoagulation Management History:      The patient is taking warfarin and comes in today for a routine follow up visit.  Positive risk factors for bleeding include an age of 75 years or older.  The bleeding index is 'intermediate risk'.  Positive CHADS2 values include History of HTN and Age > 55 years old.  The start date was 02/17/2005.  Anticoagulation responsible provider: Diona Browner MD, Remi Deter.  INR POC: 2.6.  Cuvette Lot#: [id number].  Exp: 10/11.    Anticoagulation Management Assessment/Plan:      The patient's current anticoagulation dose is Warfarin sodium 2.5 mg tabs: Take 1 tablet by mouth as directed.  The target INR is 2 - 3.  The next INR is due 08/18/2010.  Anticoagulation instructions were given to patient.  Results were reviewed/authorized by Vashti Hey, RN.  She was notified by Vashti Hey RN.         Prior Anticoagulation Instructions: INR 2.3  Continue coumadin 2.5mg  once daily except 1.25mg  on Thursdays and Saturdays  Current Anticoagulation Instructions: INR 2.6 Continue coumadin 2.5mg  once daily except 1.25mg  on Thursdays and Saturdays

## 2010-10-27 NOTE — Medication Information (Signed)
Summary: ccr-lr  Anticoagulant Therapy  Managed by: Vashti Hey, RN PCP: Lucianne Muss MD: Myrtis Ser MD, Tinnie Gens Indication 1: Atrial Fibrillation (ICD-427.31) Lab Used: [facility] [place] Site: [facility] INR POC 2.0  Dietary changes: no    Health status changes: no    Bleeding/hemorrhagic complications: no    Recent/future hospitalizations: no    Any changes in medication regimen? no    Recent/future dental: no  Any missed doses?: yes     Details: missed 1 dose Sunday  Is patient compliant with meds? yes       Allergies: 1)  ! * Statins  Anticoagulation Management History:      The patient is taking warfarin and comes in today for a routine follow up visit.  Positive risk factors for bleeding include an age of 75 years or older.  The bleeding index is 'intermediate risk'.  Positive CHADS2 values include History of HTN and Age > 64 years old.  The start date was 02/17/2005.  Anticoagulation responsible provider: Myrtis Ser MD, Tinnie Gens.  INR POC: 2.0.  Cuvette Lot#: [id number].  Exp: 10/11.    Anticoagulation Management Assessment/Plan:      The patient's current anticoagulation dose is Warfarin sodium 2.5 mg tabs: Take 1 tablet by mouth as directed.  The target INR is 2 - 3.  The next INR is due 06/19/2010.  Anticoagulation instructions were given to patient.  Results were reviewed/authorized by Vashti Hey, RN.  She was notified by Vashti Hey RN.         Prior Anticoagulation Instructions: INR 2.4 Continue couamdin 2.5mg  once daily except 1.25mg  on Th, Sat  Current Anticoagulation Instructions: INR 2.0 Take coumadin 3.75mg  tonight then resume 2.5mg  once daily except 1.25mg  on Thursdays and Saturdays

## 2010-10-27 NOTE — Procedures (Signed)
Summary: pacer check-srs   Current Medications (verified): 1)  Amiodarone Hcl 200 Mg Tabs (Amiodarone Hcl) .... Take 1/2 Tablet By Mouth Once A Day 2)  Amlodipine Besylate 10 Mg Tabs (Amlodipine Besylate) .... Take 1/2 Tablet By Mouth Once A Day 3)  Aspirin 81 Mg Tabs (Aspirin) .... Take 1 Tablet By Mouth Once A Day 4)  Crestor 5 Mg Tabs (Rosuvastatin Calcium) .... Take 1 Tablet By Mouth Every Night 5)  Isosorbide Mononitrate Cr 30 Mg Xr24h-Tab (Isosorbide Mononitrate) .... Take 1/2 Tablet By Mouth At Bedtime 6)  Levothyroxine Sodium 75 Mcg Tabs (Levothyroxine Sodium) .... Take 1 Tablet By Mouth Once A Day 7)  Warfarin Sodium 2.5 Mg Tabs (Warfarin Sodium) .... Take 1 Tablet By Mouth As Directed 8)  Zetia 10 Mg Tabs (Ezetimibe) .... Take 1 Tablet By Mouth Once A Day 9)  Metoprolol Tartrate 25 Mg Tabs (Metoprolol Tartrate) .... Take 1 Tablet By Mouth Two Times A Day 10)  Protonix 40 Mg Tbec (Pantoprazole Sodium) .... Take 1 Tablet By Mouth Once A Day 11)  Vitamin B-12 250 Mcg Tabs (Cyanocobalamin) .... Take 1 Tablet By Mouth Once A Day 12)  Fish Oil 1000 Mg Caps (Omega-3 Fatty Acids) .... Take 1 Tablet By Mouth Two Times A Day 13)  Evista 60 Mg Tabs (Raloxifene Hcl) .... Take 1 Tablet By Mouth Once A Day 14)  Amitiza 8 Mcg Caps (Lubiprostone) .... Take 1 Tablet By Mouth Once A Day 15)  Azithromycin 250 Mg Tabs (Azithromycin) .... Take 1 Tablet By Mouth Once A Day  Allergies (verified): 1)  ! * Statins  PPM Specifications Following MD:  Hillis Range, MD     PPM Vendor:  Medtronic     PPM Model Number:  [id number]     PPM Serial Number:  [id number] PPM DOI:  05/24/2003     PPM Implanting MD:  [name], MD  Lead 1    Location: RA     DOI: 05/24/2003     Model #: [id number]     Serial #: [id number]     Status: active Lead 2    Location: RV     DOI: 05/24/2003     Model #: [id number]     Serial #: [id number]     Status: active   Indications:  PAF   PPM Follow Up Remote Check?  No Battery Voltage:  2.75  V     Battery Est. Longevity:  2.5 YEARS     Pacer Dependent:  No       PPM Device Measurements Atrium  Amplitude: 1 mV, Impedance: 467 ohms, Threshold: 0.5 V at 0.4 msec Right Ventricle  Amplitude: 15.68 mV, Impedance: 762 ohms, Threshold: 1.25 V at 0.64 msec  Episodes MS Episodes:  4     Coumadin:  Yes Ventricular High Rate:  0     Atrial Pacing:  96.9%     Ventricular Pacing:  1.9%  Parameters Mode:  DDIR     Lower Rate Limit:  60     Upper Rate Limit:  105 Paced AV Delay:  300     Next Cardiology Appt Due:  03/27/2010 Tech Comments:  Normal device function.  No changes made today.  Pt does TTM's with mednet.  ROV 6 months Dr Johney Frame. Gypsy Balsam RN BSN  October 01, 2009 10:28 AM

## 2010-10-27 NOTE — Letter (Signed)
Summary: Appointment -missed  [facility] [address]   [place], [place] [address]   Phone: [phone]  Fax: [phone]     April 21, 2010 MRN: [id number]     Melanie [name] [address] [place], [place]  [address]     Dear Ms. CUDMORE,  Our records indicate you missed your appointment on April 21, 2010                        with [facility].   It is very important that we reach you to reschedule this appointment. We look forward to participating in your health care needs.   Please contact us at the number listed above at your earliest convenience to reschedule this appointment.   Sincerely,    Glass blower/designer

## 2010-10-27 NOTE — Medication Information (Signed)
Summary: ccr-lr  Anticoagulant Therapy  Managed by: Vashti Hey, RN PCP: Lucianne Muss MD: Andee Lineman MD, Michelle Piper Indication 1: Atrial Fibrillation (ICD-427.31) Lab Used: [facility] [place] Site: [facility] Care Clinic INR POC 2.4  Dietary changes: no    Health status changes: no    Bleeding/hemorrhagic complications: no    Recent/future hospitalizations: no    Any changes in medication regimen? no    Recent/future dental: no  Any missed doses?: no       Is patient compliant with meds? yes       Allergies: 1)  ! * Statins  Anticoagulation Management History:      The patient is taking warfarin and comes in today for a routine follow up visit.  Positive risk factors for bleeding include an age of 75 years or older.  The bleeding index is 'intermediate risk'.  Positive CHADS2 values include History of HTN and Age > 9 years old.  The start date was 02/17/2005.  Anticoagulation responsible provider: Andee Lineman MD, Michelle Piper.  INR POC: 2.4.  Cuvette Lot#: [id number].  Exp: 10/11.    Anticoagulation Management Assessment/Plan:      The patient's current anticoagulation dose is Warfarin sodium 2.5 mg tabs: Take 1 tablet by mouth as directed.  The target INR is 2 - 3.  The next INR is due 04/21/2010.  Anticoagulation instructions were given to patient.  Results were reviewed/authorized by Vashti Hey, RN.  She was notified by Vashti Hey RN.         Prior Anticoagulation Instructions: INR 2.3 Continue coumadin 2.5mg  once daily except 1.25mg  on Thursdays and Saturdays  Current Anticoagulation Instructions: INR 2.4 Continue coumadin 2.5mg  once daily except 1.25mg  on Thursdays and Saturdays

## 2010-10-27 NOTE — Assessment & Plan Note (Signed)
Summary: 6 MONTH FU   Visit Type:  Pacemaker check Primary Provider:  Vyas   History of Present Illness: The patient presents today for routine electrophysiology followup. She reports doing very well since last being seen in our clinic. The patient denies symptoms of palpitations, chest pain, shortness of breath, orthopnea, PND, lower extremity edema, dizziness, presyncope, syncope, or neurologic sequela. The patient is tolerating medications without difficulties and is otherwise without complaint today.   Preventive Screening-Counseling & Management  Alcohol-Tobacco     Smoking Status: never  Current Medications (verified): 1)  Amiodarone Hcl 200 Mg Tabs (Amiodarone Hcl) .... Take 1/2 Tablet By Mouth Once A Day 2)  Amlodipine Besylate 10 Mg Tabs (Amlodipine Besylate) .... Take 1/2 Tablet By Mouth Once A Day 3)  Aspirin 81 Mg Tabs (Aspirin) .... Take 1 Tablet By Mouth Once A Day 4)  Crestor 5 Mg Tabs (Rosuvastatin Calcium) .... Take 1 Tablet By Mouth Every Night 5)  Isosorbide Mononitrate Cr 30 Mg Xr24h-Tab (Isosorbide Mononitrate) .... Take 1/2 Tablet By Mouth At Bedtime 6)  Levothyroxine Sodium 75 Mcg Tabs (Levothyroxine Sodium) .... Take 1 Tablet By Mouth Once A Day 7)  Warfarin Sodium 2.5 Mg Tabs (Warfarin Sodium) .... Take 1 Tablet By Mouth As Directed 8)  Zetia 10 Mg Tabs (Ezetimibe) .... Take 1 Tablet By Mouth Once A Day 9)  Metoprolol Tartrate 25 Mg Tabs (Metoprolol Tartrate) .... Take 1 Tablet By Mouth Two Times A Day 10)  Protonix 40 Mg Tbec (Pantoprazole Sodium) .... Take 1 Tablet By Mouth Once A Day 11)  Vitamin B-12 250 Mcg Tabs (Cyanocobalamin) .... Take 1 Tablet By Mouth Once A Day 12)  Fish Oil 1000 Mg Caps (Omega-3 Fatty Acids) .... Take 1 Tablet By Mouth Two Times A Day 13)  Evista 60 Mg Tabs (Raloxifene Hcl) .... Take 1 Tablet By Mouth Once A Day 14)  Clonazepam 0.5 Mg Tabs (Clonazepam) .... Take 1 Tablet By Mouth As Needed Sleep  Allergies (verified): 1)  ! *  Statins  Comments:  Nurse/Medical Assistant: The patient's medication list and allergies were reviewed with the patient and were updated in the Medication and Allergy Lists.  Past History:  Past Medical History: Reviewed history from 08/12/2009 and no changes required. 1. Dizziness, resolved. 2. Coronary artery disease. 3. History of paroxysmal atrial fibrillation in normal sinus rhythm on     amiodarone with atrial pacing. 4. Sinus node dysfunction status post Medtronic dual-chamber     pacemaker. 5. History of hypercalcemia resolved post discontinuation of     supplemental calcium. 6. Hypertension. 7. Dyslipidemia.   1. Two-vessel coronary artery disease, quiescent.     a.     70% left anterior descending artery disease and well      collateralized, 100% right coronary artery by cardiac      catheterization in 2004.     b.     Low-risk adenosine Cardiolite with noted ischemia in small      area of mid inferior wall; ejection fraction 70%, October 2008. 2. Normal ventricular function. 3. History of paroxysmal atrial fibrillation.     a.     Maintaining his normal sinus rhythm, on amiodarone.     b.     Chronic Coumadin. 4. Sinus node dysfunction.     a.     Status post Medtronic dual-chamber pacemaker, followed by      Dr. [name]. 5. Hypertension, stable. 6. Dyslipidemia, well controlled. 7. Hypothyroidism.   Past Cardiac History:  Recent Diagnostics: IMPRESSION: 1. Paroxysmal atrial fibrillation. 2. Sinus node dysfunction, status post pacemaker implantation. 3. Ischemic heart disease with prior percutaneous coronary     intervention and normal left ventricular function. 4. Dyspnea on exertion - recently worsening, question anginal     equivalent. 5. Amiodarone for the above.   Ms. Hamme pacemaker is functioning normally.  I am a little worried about her dyspnea on exertion, if it continues to be a problem, I have asked her to let us know and we may need  to undertake Myoview scanning.   In addition, it is possible that reprogramming of her device might ameliorate some of these symptoms as well.   I trust that she will be getting her amiodarone, surveillance laboratories, and thyroid adjustments by Dr. Sherril Croon.   We will see her again in 6 months' time.  Social History: Reviewed history from 08/12/2009 and no changes required. Tobacco Use - No.   Vital Signs:  Patient profile:   77 year old female Height:      63 inches Weight:      160 pounds BMI:     28.45 Pulse rate:   71 / minute BP sitting:   128 / 76  (left arm) Cuff size:   regular  Vitals Entered By: Carlye Grippe (June 26, 2010 3:18 PM)  Nutrition Counseling: Patient's BMI is greater than 25 and therefore counseled on weight management options.  Physical Exam  General:  Well developed, well nourished, in no acute distress. Head:  normocephalic and atraumatic Eyes:  PERRLA/EOM intact; conjunctiva and lids normal. Mouth:  Teeth, gums and palate normal. Oral mucosa normal. Neck:  supple Lungs:  Clear bilaterally to auscultation and percussion. Heart:  RRR Abdomen:  Bowel sounds positive; abdomen soft and non-tender without masses, organomegaly, or hernias noted. No hepatosplenomegaly. Msk:  Back normal, normal gait. Muscle strength and tone normal. Pulses:  pulses normal in all 4 extremities Extremities:  No clubbing or cyanosis. Neurologic:  Alert and oriented x 3.   PPM Specifications Following MD:  Hillis Range, MD     PPM Vendor:  Medtronic     PPM Model Number:  [id number]     PPM Serial Number:  [id number] PPM DOI:  05/24/2003     PPM Implanting MD:  [name], MD  Lead 1    Location: RA     DOI: 05/24/2003     Model #: 1642     Serial #: [id number]     Status: active Lead 2    Location: RV     DOI: 05/24/2003     Model #: [id number]     Serial #: [id number]     Status: active   Indications:  PAF   PPM Follow Up Battery Voltage:  2.73 V     Battery Est.  Longevity:  2 yrs     Pacer Dependent:  No       PPM Device Measurements Atrium  Amplitude: 1.40 mV, Impedance: 457 ohms, Threshold: 0.50 V at 0.40 msec Right Ventricle  Amplitude: 15.68 mV, Impedance: 776 ohms, Threshold: 0.50 V at 0.40 msec  Episodes MS Episodes:  147     Coumadin:  Yes Ventricular High Rate:  0     Atrial Pacing:  98.6%     Ventricular Pacing:  3.5%  Parameters Mode:  DDIR     Lower Rate Limit:  60     Upper Rate Limit:  105 Paced AV Delay:  300  Next Cardiology Appt Due:  11/30/2010 Tech Comments:  147 AHR EPISODES--LONGEST WAS 16 MINUTES. + COUMADIN.  NORMAL DEVICE FUNCTION.  NO CHANGES MADE. MEDNET TTM 3 MTHS. ROV IN 6 MTHS W/DEVICE CLINIC. Vella Kohler  June 26, 2010 3:38 PM MD Comments:  agree  Impression & Recommendations:  Problem # 1:  SINOATRIAL NODE DYSFUNCTION (ICD-427.81) normal pacemaker function as above continue TTMs return in 6 months  Problem # 2:  ATRIAL FIBRILLATION, PAROXYSMAL (ICD-427.31) maintaining sinus with amiodarone (low dose) Dr Andee Lineman to follow LFTs/TFTs continue coumadin for stroke prevention  Problem # 3:  HYPERTENSION, BENIGN (ICD-401.1) stable  Problem # 4:  CAD, NATIVE VESSEL (ICD-414.01) no symptoms of ischemia

## 2010-10-27 NOTE — Letter (Signed)
Summary: Appointment -missed  [facility] at [facility] [address]   [place], [place] [address]   Phone: [phone]  Fax: [phone]     September 30, 2009 MRN: [id number]      Melanie [name] [address] [place], [place]  [address]     Dear [name],  Our records indicate you missed your appointment on September 30, 2009                        with [facility].   It is very important that we reach you to reschedule this appointment. We look forward to participating in your health care needs.   Please contact us at the number listed above at your earliest convenience to reschedule this appointment.   Sincerely,    Glass blower/designer

## 2010-10-27 NOTE — Medication Information (Signed)
Summary: ccr-lr  Anticoagulant Therapy  Managed by: Vashti Hey, RN PCP: Lucianne Muss MD: Diona Browner MD, Remi Deter Indication 1: Atrial Fibrillation (ICD-427.31) Lab [facility]  Site: [facility] Clinic INR POC 2.3  Dietary changes: no    Health status changes: no    Bleeding/hemorrhagic complications: no    Recent/future hospitalizations: no    Any changes in medication regimen? no    Recent/future dental: no  Any missed doses?: no       Is patient compliant with meds? yes       Allergies: 1)  ! * Statins  Anticoagulation Management History:      The patient is taking warfarin and comes in today for a routine follow up visit.  Positive risk factors for bleeding include an age of 40 years or older.  The bleeding index is 'intermediate risk'.  Positive CHADS2 values include History of HTN and Age > 19 years old.  The start date was 02/17/2005.  Anticoagulation responsible provider: Diona Browner MD, Remi Deter.  INR POC: 2.3.  Cuvette Lot#: [id number].  Exp: 10/11.    Anticoagulation Management Assessment/Plan:      The patient's current anticoagulation dose is Warfarin sodium 2.5 mg tabs: Take 1 tablet by mouth as directed.  The target INR is 2 - 3.  The next INR is due 09/15/2010.  Anticoagulation instructions were given to patient.  Results were reviewed/authorized by Vashti Hey, RN.  She was notified by Vashti Hey RN.         Prior Anticoagulation Instructions: INR 2.6 Continue coumadin 2.5mg  once daily except 1.25mg  on Thursdays and Saturdays  Current Anticoagulation Instructions: INR 2.3 Continue coumadin 2.5mg  once daily except 1.25mg  on Th,Sat

## 2010-10-27 NOTE — Medication Information (Signed)
Summary: CCR-SRS  Anticoagulant Therapy  Managed by: Vashti Hey, RN PCP: Lucianne Muss MD: Diona Browner MD, Remi Deter Indication 1: Atrial Fibrillation (ICD-427.31) Lab Used: [facility] [place] Site: [facility] Care Clinic INR POC 2.2  Dietary changes: no    Health status changes: no    Bleeding/hemorrhagic complications: no    Recent/future hospitalizations: no    Any changes in medication regimen? no    Recent/future dental: no  Any missed doses?: no       Is patient compliant with meds? yes       Allergies: 1)  ! * Statins  Anticoagulation Management History:      The patient is taking warfarin and comes in today for a routine follow up visit.  Positive risk factors for bleeding include an age of 75 years or older.  The bleeding index is 'intermediate risk'.  Positive CHADS2 values include History of HTN and Age > 46 years old.  The start date was 02/17/2005.  Anticoagulation responsible provider: Diona Browner MD, Remi Deter.  INR POC: 2.2.  Cuvette Lot#: [id number].  Exp: 10/11.    Anticoagulation Management Assessment/Plan:      The patient's current anticoagulation dose is Warfarin sodium 2.5 mg tabs: Take 1 tablet by mouth as directed.  The target INR is 2 - 3.  The next INR is due 02/20/2010.  Anticoagulation instructions were given to patient.  Results were reviewed/authorized by Vashti Hey, RN.  She was notified by Vashti Hey RN.         Prior Anticoagulation Instructions: INR 2.5 Continue coumadin 2.5mg  once daily except 1.25mg  on Thursdays and Saturdays  Current Anticoagulation Instructions: INR 2.2 Continue coumadin 2.5mg  once daily except 1.25mg  on Thursdays and Saturdays

## 2010-10-27 NOTE — Cardiovascular Report (Signed)
Summary: Office Visit   Office Visit   Imported By: Roderic Ovens 10/09/2009 14:02:03  _____________________________________________________________________  External Attachment:    Type:   Image     Comment:   External Document

## 2010-10-27 NOTE — Medication Information (Signed)
Summary: ccr-lr  Anticoagulant Therapy  Managed by: Vashti Hey, RN PCP: Lucianne Muss MD: Andee Lineman MD, Michelle Piper Indication 1: Atrial Fibrillation (ICD-427.31) Lab Used: [facility] [place] Site: [facility] Clinic INR POC 2.3  Dietary changes: no    Health status changes: no    Bleeding/hemorrhagic complications: no    Recent/future hospitalizations: no    Any changes in medication regimen? no    Recent/future dental: no  Any missed doses?: no       Is patient compliant with meds? yes       Allergies: 1)  ! * Statins  Anticoagulation Management History:      The patient is taking warfarin and comes in today for a routine follow up visit.  Positive risk factors for bleeding include an age of 75 years or older.  The bleeding index is 'intermediate risk'.  Positive CHADS2 values include History of HTN and Age > 51 years old.  The start date was 02/17/2005.  Anticoagulation responsible provider: Andee Lineman MD, Michelle Piper.  INR POC: 2.3.  Cuvette Lot#: [id number].  Exp: 10/11.    Anticoagulation Management Assessment/Plan:      The patient's current anticoagulation dose is Warfarin sodium 2.5 mg tabs: Take 1 tablet by mouth as directed.  The target INR is 2 - 3.  The next INR is due 07/17/2010.  Anticoagulation instructions were given to patient.  Results were reviewed/authorized by Vashti Hey, RN.  She was notified by Vashti Hey RN.         Prior Anticoagulation Instructions: INR 2.0 Take coumadin 3.75mg  tonight then resume 2.5mg  once daily except 1.25mg  on Thursdays and Saturdays  Current Anticoagulation Instructions: INR 2.3  Continue coumadin 2.5mg  once daily except 1.25mg  on Thursdays and Saturdays

## 2010-10-29 NOTE — Medication Information (Signed)
Summary: ccr-lr  Anticoagulant Therapy  Managed by: Vashti Hey, RN PCP: Lucianne Muss MD: Andee Lineman MD, Michelle Piper Indication 1: Atrial Fibrillation (ICD-427.31) Lab Used: [facility] [place] Site: [facility] Clinic INR POC 2.9  Dietary changes: no    Health status changes: no    Bleeding/hemorrhagic complications: no    Recent/future hospitalizations: no    Any changes in medication regimen? no    Recent/future dental: no  Any missed doses?: no       Is patient compliant with meds? yes       Allergies: 1)  ! * Statins  Anticoagulation Management History:      The patient is taking warfarin and comes in today for a routine follow up visit.  Positive risk factors for bleeding include an age of 52 years or older.  The bleeding index is 'intermediate risk'.  Positive CHADS2 values include History of HTN and Age > 6 years old.  The start date was 02/17/2005.  Anticoagulation responsible provider: Andee Lineman MD, Michelle Piper.  INR POC: 2.9.  Cuvette Lot#: [id number].  Exp: 10/11.    Anticoagulation Management Assessment/Plan:      The patient's current anticoagulation dose is Warfarin sodium 2.5 mg tabs: Take 1 tablet by mouth as directed.  The target INR is 2 - 3.  The next INR is due 10/06/2010.  Anticoagulation instructions were given to patient.  Results were reviewed/authorized by Vashti Hey, RN.  She was notified by Vashti Hey RN.         Prior Anticoagulation Instructions: INR 2.3 Continue coumadin 2.5mg  once daily except 1.25mg  on Th,Sat  Current Anticoagulation Instructions: INR 2.9 Continue coumadin 2.5mg  once daily except 1.25mg  on Th,Sat

## 2010-10-29 NOTE — Letter (Signed)
Summary: Engineer, materials at [facility]  [address]   [place], [place] [address]   Phone: [phone]  Fax: [phone]        October 16, 2010 MRN: [id number]   JAMILLIA CLOSSON [address] [place], [place]  [address]   Dear Ms. Andrey Campanile,  Your test ordered by Selena Batten has been reviewed by your physician (or physician assistant) and was found to be normal or stable. Your physician (or physician assistant) felt no changes were needed at this time.  ____ Echocardiogram  ____ Cardiac Stress Test  __X__ Lab Work - liver function & lipid panel at goal.  Continue current medications.  ____ Peripheral vascular study of arms, legs or neck  ____ CT scan or X-ray  ____ Lung or Breathing test  ____ Other:   Thank you.   Hoover Brunette, LPN    Duane Boston, M.D., F.A.C.C. Thressa Sheller, M.D., F.A.C.C. Oneal Grout, M.D., F.A.C.C. Cheree Ditto, M.D., F.A.C.C. Daiva Nakayama, M.D., F.A.C.C. Kenney Houseman, M.D., F.A.C.C. Jeanne Ivan, PA-C

## 2010-10-29 NOTE — Medication Information (Signed)
Summary: ccr at Dr Andee Lineman appt-lr  Anticoagulant Therapy  Managed by: Vashti Hey, RN PCP: Lucianne Muss MD: Andee Lineman MD, Michelle Piper Indication 1: Atrial Fibrillation (ICD-427.31) Lab Used: [facility] [place] Site: [facility] INR POC 2.6  Dietary changes: no    Health status changes: no    Bleeding/hemorrhagic complications: no    Recent/future hospitalizations: no    Any changes in medication regimen? no    Recent/future dental: no  Any missed doses?: no       Is patient compliant with meds? yes       Allergies: 1)  ! * Statins  Anticoagulation Management History:      The patient is taking warfarin and comes in today for a routine follow up visit.  Positive risk factors for bleeding include an age of 75 years or older.  The bleeding index is 'intermediate risk'.  Positive CHADS2 values include History of HTN and Age > 68 years old.  The start date was 02/17/2005.  Anticoagulation responsible provider: Andee Lineman MD, Michelle Piper.  INR POC: 2.6.  Cuvette Lot#: [id number].  Exp: 10/11.    Anticoagulation Management Assessment/Plan:      The patient's current anticoagulation dose is Warfarin sodium 2.5 mg tabs: Take 1 tablet by mouth as directed.  The target INR is 2 - 3.  The next INR is due 11/03/2010.  Anticoagulation instructions were given to patient.  Results were reviewed/authorized by Vashti Hey, RN.  She was notified by Vashti Hey RN.         Prior Anticoagulation Instructions: INR 2.9 Continue coumadin 2.5mg  once daily except 1.25mg  on Th,Sat  Current Anticoagulation Instructions: INR 2.6 Continue coumadin 2.5mg  once daily except 1.25mg  on Th,Sat

## 2010-10-29 NOTE — Cardiovascular Report (Signed)
Summary: TTM   TTM   Imported By: Roderic Ovens 09/11/2010 15:12:59  _____________________________________________________________________  External Attachment:    Type:   Image     Comment:   External Document

## 2010-10-29 NOTE — Medication Information (Signed)
Summary: [facility] D/C MEDICATION ORDERS  [facility] D/C MEDICATION ORDERS   Imported By: Zachary George 10/05/2010 14:45:36  _____________________________________________________________________  External Attachment:    Type:   Image     Comment:   External Document

## 2010-10-29 NOTE — Letter (Signed)
Summary: [facility] H&P/D/C DR. DHRUV VYAS  [facility] H[facility]P/D/C DR. DHRUV VYAS   Imported By: Zachary George 10/05/2010 14:46:17  _____________________________________________________________________  External Attachment:    Type:   Image     Comment:   External Document

## 2010-10-29 NOTE — Assessment & Plan Note (Signed)
Summary: 6 mo fu per dec remidner-srs   Visit Type:  Follow-up Primary Provider:  Sherril Croon   History of Present Illness: the patient is an 77 year old female with history of coronary artery disease, paroxysmal atrial fibrillation, permanent pacemaker and amiodarone therapy currently in atrial pacing. She is on chronic Coumadin therapy and her INR was 2.6 today. She is slightly hypertensive in the office today but she missed her blood pressure medications last night. The patient denies any chest pain shortness of breath orthopnea PND she has no palpitations or syncope. She is doing well from a cardiovascular perspective  Preventive Screening-Counseling & Management  Alcohol-Tobacco     Smoking Status: never  Current Medications (verified): 1)  Amiodarone Hcl 200 Mg Tabs (Amiodarone Hcl) .... Take 1/2 Tablet By Mouth Once A Day 2)  Amlodipine Besylate 10 Mg Tabs (Amlodipine Besylate) .... Take 1/2 Tablet By Mouth Once A Day 3)  Aspirin 81 Mg Tabs (Aspirin) .... Take 1 Tablet By Mouth Once A Day 4)  Crestor 5 Mg Tabs (Rosuvastatin Calcium) .... Take 1 Tablet By Mouth Every Night 5)  Isosorbide Mononitrate Cr 30 Mg Xr24h-Tab (Isosorbide Mononitrate) .... Take 1/2 Tablet By Mouth At Bedtime 6)  Levothyroxine Sodium 75 Mcg Tabs (Levothyroxine Sodium) .... Take 1 Tablet By Mouth Once A Day 7)  Warfarin Sodium 2.5 Mg Tabs (Warfarin Sodium) .... Take 1 Tablet By Mouth As Directed 8)  Zetia 10 Mg Tabs (Ezetimibe) .... Take 1 Tablet By Mouth Once A Day 9)  Metoprolol Tartrate 25 Mg Tabs (Metoprolol Tartrate) .... Take 1 Tablet By Mouth Two Times A Day 10)  Protonix 40 Mg Tbec (Pantoprazole Sodium) .... Take 1 Tablet By Mouth Once A Day 11)  Vitamin B-12 250 Mcg Tabs (Cyanocobalamin) .... Take 1 Tablet By Mouth Once A Day 12)  Fish Oil 1000 Mg Caps (Omega-3 Fatty Acids) .... Take 1 Tablet By Mouth Two Times A Day 13)  Evista 60 Mg Tabs (Raloxifene Hcl) .... Take 1 Tablet By Mouth Once A Day 14)   Clonazepam 0.5 Mg Tabs (Clonazepam) .... Take 1 Tablet By Mouth As Needed Sleep  Allergies (verified): 1)  ! * Statins  Comments:  Nurse/Medical Assistant: The patient's medication list and allergies were reviewed with the patient and were updated in the Medication and Allergy Lists.  Past History:  Past Medical History: Last updated: 08/12/2009 1. Dizziness, resolved. 2. Coronary artery disease. 3. History of paroxysmal atrial fibrillation in normal sinus rhythm on     amiodarone with atrial pacing. 4. Sinus node dysfunction status post Medtronic dual-chamber     pacemaker. 5. History of hypercalcemia resolved post discontinuation of     supplemental calcium. 6. Hypertension. 7. Dyslipidemia.   1. Two-vessel coronary artery disease, quiescent.     a.     70% left anterior descending artery disease and well      collateralized, 100% right coronary artery by cardiac      catheterization in 2004.     b.     Low-risk adenosine Cardiolite with noted ischemia in small      area of mid inferior wall; ejection fraction 70%, October 2008. 2. Normal ventricular function. 3. History of paroxysmal atrial fibrillation.     a.     Maintaining his normal sinus rhythm, on amiodarone.     b.     Chronic Coumadin. 4. Sinus node dysfunction.     a.     Status post Medtronic dual-chamber pacemaker, followed by  Dr. [name]. 5. Hypertension, stable. 6. Dyslipidemia, well controlled. 7. Hypothyroidism.   Family History: Last updated: 10/06/2010 Negative FH of Diabetes, Hypertension, or Coronary Artery Disease  Social History: Last updated: 08/12/2009 Tobacco Use - No.   Risk Factors: Smoking Status: never (10/06/2010)  Past Cardiac History:  Recent Diagnostics: IMPRESSION: 1. Paroxysmal atrial fibrillation. 2. Sinus node dysfunction, status post pacemaker implantation. 3. Ischemic heart disease with prior percutaneous coronary     intervention and normal left  ventricular function. 4. Dyspnea on exertion - recently worsening, question anginal     equivalent. 5. Amiodarone for the above.   Ms. Philippi pacemaker is functioning normally.  I am a little worried about her dyspnea on exertion, if it continues to be a problem, I have asked her to let us know and we may need to undertake Myoview scanning.   In addition, it is possible that reprogramming of her device might ameliorate some of these symptoms as well.   I trust that she will be getting her amiodarone, surveillance laboratories, and thyroid adjustments by Dr. Sherril Croon.   We will see her again in 6 months' time.  Family History: Negative FH of Diabetes, Hypertension, or Coronary Artery Disease  Review of Systems  The patient denies fatigue, malaise, fever, weight gain/loss, vision loss, decreased hearing, hoarseness, chest pain, palpitations, shortness of breath, prolonged cough, wheezing, sleep apnea, coughing up blood, abdominal pain, blood in stool, nausea, vomiting, diarrhea, heartburn, incontinence, blood in urine, muscle weakness, joint pain, leg swelling, rash, skin lesions, headache, fainting, dizziness, depression, anxiety, enlarged lymph nodes, easy bruising or bleeding, and environmental allergies.    Vital Signs:  Patient profile:   77 year old female Height:      63 inches Weight:      158 pounds Pulse rate:   67 / minute BP sitting:   152 / 75  (left arm) Cuff size:   regular  Vitals Entered By: Carlye Grippe (October 06, 2010 9:01 AM)  Serial Vital Signs/Assessments:  Time      Position  BP       Pulse  Resp  Temp     By 9:04 AM             137/75   69                    Carlye Grippe  Comments: 9:04 AM left arm/large cuff By: Carlye Grippe    Physical Exam  Additional Exam:  General: Well-developed, well-nourished in no distress head: Normocephalic and atraumatic eyes PERRLA/EOMI intact, conjunctiva and lids normal nose: No deformity or lesions mouth  normal dentition, normal posterior pharynx neck: Supple, no JVD.  No masses, thyromegaly or abnormal cervical nodes lungs: Normal breath sounds bilaterally without wheezing.  Normal percussion heart: regular rate and rhythm with normal S1 and S2, no S3 or S4.  PMI is normal.  No pathological murmurs abdomen: Normal bowel sounds, abdomen is soft and nontender without masses, organomegaly or hernias noted.  No hepatosplenomegaly musculoskeletal: Back normal, normal gait muscle strength and tone normal pulsus: Pulse is normal in all 4 extremities Extremities: No peripheral pitting edema neurologic: Alert and oriented x 3 skin: Intact without lesions or rashes cervical nodes: No significant adenopathy psychologic: Normal affect    EKG  Procedure date:  10/06/2010  Findings:      electronic atrial pacemaker nonspecific T-wave changes heart rate 79 beats per minute  PPM Specifications Following MD:  Hillis Range, MD  PPM Vendor:  Medtronic     PPM Model Number:  [id number]     PPM Serial Number:  [id number] PPM DOI:  05/24/2003     PPM Implanting MD:  [name], MD  Lead 1    Location: [facility]     DOI: 05/24/2003     Model #: [id number]     Serial #: [id number]     Status: active Lead 2    Location: [facility]     DOI: 05/24/2003     Model #: [id number]     Serial #: [id number]     Status: active   Indications:  PAF   PPM Follow Up Pacer Dependent:  No      Episodes Coumadin:  Yes  Parameters Mode:  DDIR     Lower Rate Limit:  60     Upper Rate Limit:  105 Paced AV Delay:  300     Impression & Recommendations:  Problem # 1:  SINOATRIAL NODE DYSFUNCTION (ICD-427.81) patient is in atrial pacing. She is on amiodarone. She is on 100 mg a day. We will check LFTs lipids and thyroid studies Her updated medication list for this problem includes:    Amiodarone Hcl 200 Mg Tabs (Amiodarone hcl) .Marland Kitchen... Take 1/2 tablet by mouth once a day    Amlodipine Besylate 10 Mg Tabs (Amlodipine besylate) .Marland Kitchen... Take 1/2 tablet  by mouth once a day    Aspirin 81 Mg Tabs (Aspirin) .Marland Kitchen... Take 1 tablet by mouth once a day    Isosorbide Mononitrate Cr 30 Mg Xr24h-tab (Isosorbide mononitrate) .Marland Kitchen... Take 1/2 tablet by mouth at bedtime    Warfarin Sodium 2.5 Mg Tabs (Warfarin sodium) .Marland Kitchen... Take 1 tablet by mouth as directed    Metoprolol Tartrate 25 Mg Tabs (Metoprolol tartrate) .Marland Kitchen... Take 1 tablet by mouth two times a day  Problem # 2:  COUMADIN THERAPY (ICD-V58.61) INR 2.6 today in stable complications  Problem # 3:  ATRIAL FIBRILLATION, PAROXYSMAL (ICD-427.31) atrial pacing as outlined above Her updated medication list for this problem includes:    Amiodarone Hcl 200 Mg Tabs (Amiodarone hcl) .Marland Kitchen... Take 1/2 tablet by mouth once a day    Aspirin 81 Mg Tabs (Aspirin) .Marland Kitchen... Take 1 tablet by mouth once a day    Warfarin Sodium 2.5 Mg Tabs (Warfarin sodium) .Marland Kitchen... Take 1 tablet by mouth as directed    Metoprolol Tartrate 25 Mg Tabs (Metoprolol tartrate) .Marland Kitchen... Take 1 tablet by mouth two times a day  Orders: EKG w/ Interpretation (93000) T-Hepatic Function (66440-34742) T-Lipid Profile (59563-87564) T-TSH (33295-18841)  Problem # 4:  CAD, NATIVE VESSEL (ICD-414.01) no recurrent chest pain. No ischemia workup needed at the present time Her updated medication list for this problem includes:    Amlodipine Besylate 10 Mg Tabs (Amlodipine besylate) .Marland Kitchen... Take 1/2 tablet by mouth once a day    Aspirin 81 Mg Tabs (Aspirin) .Marland Kitchen... Take 1 tablet by mouth once a day    Isosorbide Mononitrate Cr 30 Mg Xr24h-tab (Isosorbide mononitrate) .Marland Kitchen... Take 1/2 tablet by mouth at bedtime    Warfarin Sodium 2.5 Mg Tabs (Warfarin sodium) .Marland Kitchen... Take 1 tablet by mouth as directed    Metoprolol Tartrate 25 Mg Tabs (Metoprolol tartrate) .Marland Kitchen... Take 1 tablet by mouth two times a day  Patient Instructions: 1)  Labs:  LFT, FLP, TSH 2)  Follow up in  6 months

## 2010-11-03 ENCOUNTER — Encounter (INDEPENDENT_AMBULATORY_CARE_PROVIDER_SITE_OTHER): Payer: Medicare Other

## 2010-11-03 ENCOUNTER — Encounter: Payer: Self-pay | Admitting: Cardiology

## 2010-11-03 DIAGNOSIS — I4891 Unspecified atrial fibrillation: Secondary | ICD-10-CM

## 2010-11-03 LAB — CONVERTED CEMR LAB: POC INR: 1.9

## 2010-11-12 NOTE — Medication Information (Signed)
Summary: ccr-lr  Lab Visit  Orders Today:  Anticoagulant Therapy  Managed by: Vashti Hey, RN PCP: Lucianne Muss MD: Diona Browner MD, Remi Deter Indication 1: Atrial Fibrillation (ICD-427.31) Lab Used: [facility] [place] Site: [facility] INR POC 1.9  Dietary changes: no    Health status changes: no    Bleeding/hemorrhagic complications: no    Recent/future hospitalizations: no    Any changes in medication regimen? no    Recent/future dental: no  Any missed doses?: no       Is patient compliant with meds? yes         Anticoagulation Management History:      The patient is taking warfarin and comes in today for a routine follow up visit.  Positive risk factors for bleeding include an age of 75 years or older.  The bleeding index is 'intermediate risk'.  Positive CHADS2 values include History of HTN and Age > 64 years old.  The start date was 02/17/2005.  Anticoagulation responsible provider: Diona Browner MD, Remi Deter.  INR POC: 1.9.  Cuvette Lot#: [id number].  Exp: 10/11.    Anticoagulation Management Assessment/Plan:      The patient's current anticoagulation dose is Warfarin sodium 2.5 mg tabs: Take 1 tablet by mouth as directed.  The target INR is 2 - 3.  The next INR is due 12/01/2010.  Anticoagulation instructions were given to patient.  Results were reviewed/authorized by Vashti Hey, RN.  She was notified by Vashti Hey RN.         Prior Anticoagulation Instructions: INR 2.6 Continue coumadin 2.5mg  once daily except 1.25mg  on Th,Sat  Current Anticoagulation Instructions: INR 1.9 Take coumadin 1 1/2 tablets tonight then resume 1 tablet once daily except 1/2 tablet on Thursdays and Saturdays

## 2010-12-12 ENCOUNTER — Encounter: Payer: Self-pay | Admitting: Cardiology

## 2010-12-12 DIAGNOSIS — Z7901 Long term (current) use of anticoagulants: Secondary | ICD-10-CM | POA: Insufficient documentation

## 2010-12-12 DIAGNOSIS — I4891 Unspecified atrial fibrillation: Secondary | ICD-10-CM

## 2010-12-15 ENCOUNTER — Ambulatory Visit (INDEPENDENT_AMBULATORY_CARE_PROVIDER_SITE_OTHER): Payer: Medicare Other | Admitting: *Deleted

## 2010-12-15 DIAGNOSIS — I4891 Unspecified atrial fibrillation: Secondary | ICD-10-CM

## 2010-12-15 DIAGNOSIS — Z7901 Long term (current) use of anticoagulants: Secondary | ICD-10-CM

## 2010-12-15 LAB — POCT INR: INR: 2.3

## 2010-12-15 NOTE — Patient Instructions (Signed)
Continue coumadin 2.5mg  qd except 1.25mg  on Tuesdays and Saturdays

## 2010-12-28 ENCOUNTER — Other Ambulatory Visit: Payer: Self-pay | Admitting: *Deleted

## 2010-12-28 MED ORDER — ISOSORBIDE MONONITRATE 30 MG PO TB24: ORAL_TABLET | ORAL | Status: DC

## 2011-01-01 ENCOUNTER — Encounter: Payer: Self-pay | Admitting: Internal Medicine

## 2011-01-01 DIAGNOSIS — I4891 Unspecified atrial fibrillation: Secondary | ICD-10-CM

## 2011-01-06 ENCOUNTER — Ambulatory Visit (INDEPENDENT_AMBULATORY_CARE_PROVIDER_SITE_OTHER): Payer: Medicare Other | Admitting: *Deleted

## 2011-01-06 DIAGNOSIS — I4891 Unspecified atrial fibrillation: Secondary | ICD-10-CM

## 2011-01-06 NOTE — Progress Notes (Signed)
Pacer check in clinic  

## 2011-01-12 ENCOUNTER — Ambulatory Visit (INDEPENDENT_AMBULATORY_CARE_PROVIDER_SITE_OTHER): Payer: Medicare Other | Admitting: *Deleted

## 2011-01-12 DIAGNOSIS — Z7901 Long term (current) use of anticoagulants: Secondary | ICD-10-CM

## 2011-01-12 DIAGNOSIS — I4891 Unspecified atrial fibrillation: Secondary | ICD-10-CM

## 2011-01-12 LAB — POCT INR: INR: 2.6

## 2011-02-09 ENCOUNTER — Ambulatory Visit (INDEPENDENT_AMBULATORY_CARE_PROVIDER_SITE_OTHER): Payer: Medicare Other | Admitting: *Deleted

## 2011-02-09 DIAGNOSIS — I4891 Unspecified atrial fibrillation: Secondary | ICD-10-CM

## 2011-02-09 DIAGNOSIS — Z7901 Long term (current) use of anticoagulants: Secondary | ICD-10-CM

## 2011-02-09 LAB — POCT INR: INR: 2.6

## 2011-02-09 NOTE — Assessment & Plan Note (Signed)
[facility]                          [facility] NOTE   Melanie [name], Melanie [name]                        MRN:          [id number]  DATE:08/21/2007                            DOB:          18-Mar-1926    PRIMARY CARDIOLOGIST:  Dr. Lewayne Bunting.   REASON FOR VISIT:  Scheduled followup.  Please refer to Dr. Margarita Mail  office note of October 21, for full details.   The patient continues to remain stable from a clinical standpoint with  no interim development of signs/symptoms suggestive of unstable angina  pectoris.  Her recent followup adenosine Cardiolite was stable, noting  mild/moderate ischemia in a relatively small area in the mid inferior  wall; EF 70% with no focal wall motion abnormalities.  This compares to  her previous Cardiolite in 2003 revealing inferior wall ischemia.  Her  previous catheterization in 2004 revealed substantial 2-vessel coronary  artery disease with 100% occlusion of the RCA and a complex, 70% LAD  lesion with good collateralization to the RCA.   The patient also has a history of PAF, maintaining NSR on amiodarone,  and is on chronic Coumadin.  She denies any tachy palpitations.   The patient has some occasional dizziness, but this appears stable as  well.  More recently, however, she reports having experienced a  sensation of her head feeling full as well as a hurting on the left  parietal aspect.  It is not clear if this is something new or just worse  than it has been in the past.  She denies any focal deficit, including  diplopia, blurred vision, or any focal weakness of either the upper or  lower extremities.  She also took her blood pressure at home yesterday,  stating that it was okay.   MEDICATIONS:  Unchanged since previous visit.   PHYSICAL EXAMINATION:  Blood pressure 138/79, pulse 80 and regular,  weight 156.8 (unchanged).  GENERAL:  A 77 year old female sitting upright in no distress.  HEENT:   Normocephalic, atraumatic.  NECK:  Palpable bilateral carotid pulses without bruits; no JVD.  LUNGS:  Clear to auscultation in all fields.  HEART:  Regular rate and rhythm (S1, S2).  No significant murmurs.  ABDOMEN:  Benign.  EXTREMITIES:  No edema.  NEURO:  Flat affect, but no focal deficits.   Most recent lab data notable for a normal PTH.  Improved calcium level  of 10.4, previously 11.8 in late September.   IMPRESSION:  1. Intermittent dizziness.  2. Two-vessel coronary artery disease.      a.     Stable by recent adenosine stress Cardiolite with a small       area of inferior ischemia, consistent with known 100% occlusion of       right coronary artery.      b.     Normal left ventricular function.  3. History of paroxysmal atrial fibrillation.      a.     Maintaining normal sinus rhythm on amiodarone.      b.     Chronic Coumadin.  4. Sinus node dysfunction.  a.     Status post Medtronic dual-chamber pacemaker, followed by       Dr. [name].  5. Status post hypercalcemia.      a.     Improved with discontinuation of supplemental calcium.  6. Hypertension.  7. Dyslipidemia.   PLAN:  1. Schedule CT scan to exclude neurologic pathology, which may be      related to a recent complaint of significant headache and/or pain.      She expressed concern given that she is on aspirin, in addition to      Coumadin, and that her mother may have suffered a cerebral      hemorrhage.  2. Continue current medication regimen.  The patient has no clinical      symptoms suggestive of unstable angina pectoris.  Therefore, no      further workup is recommended at this point in time.  3. Check a followup fasting lipid/liver profile for aggressive      monitoring and management of hyperlipidemia, with an LDL target      goal of 70 or less.  4. Schedule return clinic followup with myself and Dr. Andee Lineman in 3      months.      Gene Serpe, PA-C  Electronically Signed      Learta Codding, MD,FACC  Electronically Signed   GS/MedQ  DD: 08/21/2007  DT: 08/21/2007  Job #: [id number]   cc:   Doreen Beam

## 2011-02-09 NOTE — Cardiovascular Report (Signed)
[facility]                   [facility]   VON, INSCOE                        MRN:          [id number]  DATE:08/16/2008                            DOB:          Jan 31, 1926    Melanie [name] was seen in followup for tachy-brady syndrome.  She takes  Amiodarone for this.  Her complaints include problem with her eye for  which she is seeing the eye doctor next week, problem with her foot, and  then a fatigue that has been progressive over recent months.  She does  have lab work that was ordered by [facility] demonstrating a  TSH that was mildly elevated now at 5.6 or 5.7 or so.  The rest of the  labs were unrevealing.  She does not know if she snores.  She does not  sleep well.  She does have a medication at home that she can take for  her restless legs and when she does this, she sleeps much better and I  have encouraged her to take that.  It may well be clonazepam.   OTHER MEDICATIONS:  1. Amlodipine 5.  2. Amiodarone 100 daily.  3. Levothyroxine 50.  4. Isosorbide 15.  5. Zetia.  6. Avista.  7. Metoprolol 25 b.i.d.  8. Crestor.  9. Warfarin.  10.Protonix.   PHYSICAL EXAMINATION:  VITAL SIGNS:  Her blood pressure today was 142/75  and the pulse was 76.  LUNGS:  Clear.  HEART:  Sounds were regular.  ABDOMEN:  Soft.  EXTREMITIES:  Without edema.   Interrogation of her Medtronic Kappa pulse generator demonstrates a P-  wave of 1 with impedance of 419, with threshold of 0.5 at 0.4.  The R-  waves are 11 with impedance of 713 with a threshold of 1 volt at 0.4.  Battery voltage is 2.77 and no intercurrent episodes of atrial  fibrillation.   IMPRESSION:  1. Paroxysmal atrial fibrillation.  2. Bradycardia.  3. Status post pacer for bradycardia.  4. Amiodarone for paroxysmal atrial fibrillation.  5. Fatigue.  6. Polypharmacy.  7. Treated hypothyroidism.   I suggested to Ms. Balducci that we  might want to uptitrate her  levothyroxine a little bit, so I have asked her to take 75 mcg in  anticipation of her seeing Dr. Sherryll Burger again.   I have asked her also to decrease her amiodarone from 7 days a week to 5  days a week.   The rest of her medication list is really quite complicated.  I wonder  whether there is enough room for some simplification.  I will defer this  to Dr. Andee Lineman and Dr. Sherryll Burger.   We will see her again in 1 year's time.     Duke Salvia, MD, [facility]  Electronically Signed    SCK/MedQ  DD: 08/16/2008  DT: 08/17/2008  Job #: [id number]

## 2011-02-09 NOTE — Assessment & Plan Note (Signed)
[facility]                          [facility]   Melanie [name], Melanie [name]                        MRN:          [id number]  DATE:02/06/2007                            DOB:          12/05/1925    PRIMARY CARDIOLOGIST:  Learta Codding, MD,FACC.   REASON FOR VISIT:  Scheduled two-week followup.   Please refer to my clinic note of January 19, 2007, for full details.   Since last seen, the patient reports complete resolution of her misery  which was associated with initial walking.  This was after we had placed  her on Norvasc 5 daily, rather than up-titrating her low dose Imdur  secondary to history of headaches.  She also feels much better in  general and has clearly noted a difference with the addition of this  medication.   RECENT LABORATORY:  Per Dr. Berton Mount, are reviewed. TSH 0.19 and ESR  37.   The patient is on supplemental thyroid medication with Levoxyl 50 mcg.  Interestingly, her last TSH level in March 2007, was normalized at 5.27.   CURRENT MEDICATIONS:  As previously outlined with addition of Norvasc 5  daily.   INR, today, was 2.1.   Electrocardiogram, today, reveals atrial pacing at 85 BPM.   PHYSICAL EXAMINATION:  VITAL SIGNS:  Blood pressure 141/80, pulse 85  regular, weight 153.8.  GENERAL:  A 77 year old female sitting upright in no distress.  HEENT:  Normocephalic atraumatic.  NECK:  Palpable bilateral carotid pulses without bruits; no JVD at 90  degrees.  LUNGS:  Clear to auscultation all fields.  HEART:  Regular rate and rhythm (S1 S2).  No significant murmurs.  ABDOMEN:  Benign.  EXTREMITIES:  No significant edema.  NEUROLOGIC:  No focal deficits.   IMPRESSION:  1. Chronic stable angina pectoris.      a.     Ameliorated with addition of Norvasc.  2. Two-vessel coronary artery disease.      a.     Status post cardiac catheterization, February 2004:       Recommended medical therapy.  3. History of  paroxysmal atrial fibrillation.      a.     Maintaining normal sinus rhythm on amiodarone.      b.     Chronic Coumadin.  4. Sinus node dysfunction.      a.     Status post Medtronic dual-chamber pacemaker, followed by       Dr. [name].  5. Hypothyroidism.      a.     Current TSH level 0.19.  6. Hypertension.  7. Hyperlipidemia.   PLAN:  1. Continue medical management of chronic stable angina pectoris, and      in fact, up titrate Norvasc to the maximum dose of 10 mg daily.      The patient is quite pleased we are not recommending a diagnostic      cardiac catheterization at the present time, given resolution of      her symptoms on medical therapy.  However, I did point out to her  that if she were to develop breakthrough angina on maximum medical      therapy, then we would need to consider a cardiac catheterization.      As before, she is agreeable with this plan.  2. Continue current Coumadin dose and repeat proTime in 4 weeks.  3. Repeat thyroid function studies with repeat TSH, free T3, and free      T4 levels.  If TSH level is persistently low, then the patient will      need to have down titration of her current Levoxyl dose.  4. Schedule return clinic followup with myself and Dr. Andee Lineman in 4-6      weeks, for continued close monitoring of symptoms and further      recommendations.      Gene Serpe, PA-C       Learta Codding, MD,FACC    GS/MedQ  DD: 02/06/2007  DT: 02/06/2007  Job #: [id number]   cc:   Doreen Beam

## 2011-02-09 NOTE — Cardiovascular Report (Signed)
[facility]                   [facility] NOTE   KAIRY, FOLSOM                        MRN:          [id number]  DATE:02/28/2009                            DOB:          [birth year]    Melanie [name] is seen in followup for pacemaker implantation for  bradycardia in the context of paroxysmal atrial fibrillation.  She takes  amiodarone for this.  We tried in the fall to down titrate her from 7  days a week to 5 days a week.  She had a couple of episodes of atrial  fibrillation in January and she put herself back on 100 mg 5 days a week  and is doing quite well.  She is fairly confident that Dr. Sherril Croon is  checking her thyroid status.  That will be quite important.   She also notes that more recently she has been having problems with  dyspnea on exertion.  She has known coronary artery disease with normal  left ventricular function, left-right collaterals and previous PCI.  She  is having no accompanying chest pain.   MEDICATIONS:  1. Warfarin.  2. Aspirin.  3. Amlodipine 5.  4. Amiodarone 100.  5. Imdur 15.  6. Zetia.  7. Evista.  8. Metoprolol 25 b.i.d.  9. Crestor.  10.Protonix.  11.Levothyroxine.  12.Meclizine.   PHYSICAL EXAMINATION:  VITAL SIGNS:  Her blood pressure was 132/77 with  a pulse of 77.  GENERAL:  She was in no acute distress.  NECK:  Her neck veins were flat.  LUNGS:  Clear.  HEART:  Heart sounds were regular.  ABDOMEN.  Soft.  EXTREMITIES:  No edema.   Interrogation of the Medtronic device demonstrates a P-wave of 2 with  impedance of 461 with threshold of 0.25 at 0.4.  The R-waves of 11 with  impedance of 659 with threshold of 0.5 at 0.41, 1.25 at 0.64.  Battery  voltage is 2.76.   IMPRESSION:  1. Paroxysmal atrial fibrillation.  2. Sinus node dysfunction, status post pacemaker implantation.  3. Ischemic heart disease with prior percutaneous coronary      intervention and normal left  ventricular function.  4. Dyspnea on exertion - recently worsening, question anginal      equivalent.  5. Amiodarone for the above.   Ms. Mcswain pacemaker is functioning normally.  I am a little worried  about her dyspnea on exertion, if it continues to be a problem, I have  asked her to let us know and we may need to undertake Myoview scanning.   In addition, it is possible that reprogramming of her device might  ameliorate some of these symptoms as well.   I trust that she will be getting her amiodarone, surveillance  laboratories, and thyroid adjustments by Dr. Sherril Croon.   We will see her again in 6 months' time.     Duke Salvia, MD, [facility]  Electronically Signed    SCK/MedQ  DD: 02/28/2009  DT: 03/01/2009  Job #: [id number]   cc:   Doreen Beam, MD

## 2011-02-09 NOTE — Assessment & Plan Note (Signed)
[facility]                          [facility]   Melanie [name], Melanie [name]                        MRN:          [id number]  DATE:07/18/2007                            DOB:          Mar 26, 1926    HISTORY OF PRESENT ILLNESS:  Patient is a 77 year old female with a  history of chronic stable angina.  The patient also has a history of  paroxysmal atrial fibrillation and is maintained on normal sinus rhythm  on amiodarone drug therapy.  The patient has been   INCOMPLETE     Learta Codding, MD,FACC  Electronically Signed    GED/MedQ  DD: 07/21/2007  DT: 07/21/2007  Job #: [id number]

## 2011-02-09 NOTE — Assessment & Plan Note (Signed)
[facility]                          Melanie [name]   Melanie [name], Melanie [name]                        MRN:          [id number]  DATE:07/18/2007                            DOB:          [birth year]    PRIMARY CARE PHYSICIAN:  Dr. Sherril Croon.   HISTORY OF PRESENT ILLNESS:  The patient is a 77 year old female with a  history of chronic stable angina.  She has known 2-vessel coronary  artery disease.  The patient also has a history of paroxysmal atrial  fibrillation but maintains normal sinus rhythm on amiodarone drug  therapy.  The patient is also on Coumadin anticoagulation.   Overall, Mrs. Melanie [name] has been doing well.  She does have stable  exertional chest pain.  She has no shortness of breath, orthopnea, or  PND.  Most recently she had significant elevation in her calcium levels.  Review of her medications demonstrate that she was taking high dose  vitamin D and calcium supplement that has not been discontinued.  More  recent calcium levels have been normalized, but there was still an  elevation of ionized calcium at 5.8.  She denies any orthopnea, PND,  palpitations, or syncope.   MEDICATIONS:  1. Norvasc 1/2 tablet 10 mg p.o. daily.  2. Isosorbide 60 mg p.o. daily.  3. Amiodarone 100 mg p.o. daily.  4. Metoprolol 25 mg p.o. b.i.d.  5. Aspirin 81 mg p.o. daily.  6. Levoxyl 50 mcg p.o. daily.  7. Evista 60 mg p.o. daily.  8. Zetia 10 mg p.o. daily.  9. Warfarin 2.5 mg p.o. daily.  10.Fish oil.   PHYSICAL EXAMINATION:  VITAL SIGNS:  Blood pressure 119/69, heart rate  68, weight 156 pounds.  NECK:  Normal carotid upstroke.  No carotid bruits.  LUNGS:  Clear breath sounds bilaterally.  HEART:  Regular rate and rhythm.  Normal S1, S2.  No murmurs, rubs, or  gallops.  ABDOMEN:  Soft and nontender.  No rebound or guarding.  Good bowel  sounds.  EXTREMITIES:  No cyanosis, clubbing, or edema.   PROBLEM LIST:  1. History of paroxysmal atrial  fibrillation.      a.     Normal sinus rhythm on amiodarone drug therapy.      b.     Chronic Coumadin therapy.  2. 2-vessel coronary artery disease.      a.     Chronic stable angina.      b.     Last catheterization February 2004.  3. Sinus node dysfunction.      a.     Status post Medtronic dual chamber pacemaker.  Followed by       Dr. Graciela Husbands.  4. Treated hypothyroidism.  5. Hypertension stable.  6. Dyslipidemia.  7. Normocytic anemia.   PLAN:  1. The patient will be referred for Cardiolite imaging study to assess      her ischemia burden.  2. Laboratory work, including free calcium level will be drawn.  Also      a TSH also is required.  3. The patient will follow up after review of the  above studies.     Learta Codding, MD,FACC  Electronically Signed    GED/MedQ  DD: 07/21/2007  DT: 07/21/2007  Job #: [id number]   cc:   Doreen Beam

## 2011-02-09 NOTE — Assessment & Plan Note (Signed)
[facility]                          [facility]   Melanie [name], Melanie [name]                        MRN:          [id number]  DATE:03/20/2007                            DOB:          07-Apr-1926    PRIMARY CARDIOLOGIST:  Dr. Lewayne Bunting.   REASON FOR VISIT:  Scheduled one-month followup.  Please refer to my  clinic note of May 12th, for full details.   Since last seen here in the clinic, patient states that this is the  best she has felt in a long time.  As I stated at time of her last  visit, she continues to report complete resolution of her misery which  was associated with onset of walking.  We have treated this medically,  given her aversion to a repeat cardiac catheterization, since I  initially saw her here in the office on April 24th. This wasfollowing a  scheduled pacemaker clinic visit with Dr. [name], at which time  patient reported symptoms worrisome for coronary artery disease  progression.   We also ordered followup thyroid studies, given a recent noted TSH level  of 0.19 ordered by Dr. Graciela Husbands.  The followup blood work showed  normalization of TSH at 2.69 with normal T3 and T4 levels.  Notably, the  patient is on both thyroid replacement as well as amiodarone.   When last seen in the clinic, I did increase the Norvasc to the maximum  dose of 10 q.d. with no other medication adjustments recommended.   CURRENT MEDICATIONS:  1. Norvasc 10 mg q.d.  2. Amiodarone 100 mg q.d.  3. Isosorbide 15 mg q.d.  4. Metoprolol 25 mg b.i.d.  5. Aspirin 81 q.d.  6. Levoxyl 0.05 mg q.d.  7. Evista.  8. Zetia 10 mg q.d.  9. Coumadin 2.5 mg as directed.  10.Fish oil 1000 mg q.d.  11.Meclizine 12.5 mg p.r.n.   PHYSICAL EXAMINATION:  VITAL SIGNS:  Blood pressure 114/68, pulse 74,  regular.  Weight 152 (down one pound).  GENERAL:  A 77 year old female, sitting upright, no distress.  HEENT:  Head normocephalic, atraumatic.  NECK:   Palpable bilateral carotid pulses, bilateral bruits; no JVD at 90  degrees.  LUNGS:  Clear to auscultation in all fields.  HEART:  Regular rate and rhythm (S1, S2).  No significant murmurs.  ABDOMEN:  Benign.  EXTREMITIES:  No edema.  NEURO:  No focal deficits.   IMPRESSION:  1. Chronic stable angina pectoris.      a.     Complete resolution of symptoms with recent addition/up-       titration of Norvasc.  2. Two-vessel coronary artery disease.      a.     Recommended medical therapy by cardiac catheterization,       February 2004.  3. History of paroxysmal atrial fibrillation.      a.     Maintaining normal sinus rhythm on amiodarone.      b.     Chronic Coumadin.  4. Sinus node dysfunction.      a.     Status post  Medtronic dual chamber pacemaker, followed by       Dr. [name].  5. Treated hypothyroidism.      a.     Recent normalization of TSH level.  6. Hypertension - well controlled.  7. Hyperlipidemia.   PLAN:  1. Continue current medication regimen.  2. Schedule return clinic followup with myself and Dr. Andee Lineman in 3      months.  3. Maintain scheduled pacer clinic followup with Dr. [name].  4. Return to Coumadin clinic as previously scheduled.      Gene Serpe, PA-C  Electronically Signed      Learta Codding, MD,FACC  Electronically Signed   GS/MedQ  DD: 03/20/2007  DT: 03/20/2007  Job #: [id number]   cc:   Doreen Beam

## 2011-02-09 NOTE — Assessment & Plan Note (Signed)
[facility]                          [facility]   DOREA, DUFF                        MRN:          [id number]  DATE:11/23/2007                            DOB:          25-Jun-1926    HISTORY OF PRESENT ILLNESS:  The patient is an elderly female with known  coronary artery disease and chronic unstable angina.  The patient has  been doing well from a cardiac standpoint.  She denies any chest pain  nor shortness of breath, orthopnea or PND.  Her medication list includes  warfarin, omega fish oil, B12, aspirin, Amlodipine, amiodarone,  isosorbide, levothyroxine, Zetia, Evista, metoprolol and Crestor.   PHYSICAL EXAMINATION:  VITAL SIGNS:  Temperature 124/72, heart rate 64,  weight 154 pounds.  NECK EXAM:  Normal carotid upstroke, no carotid bruits.  LUNGS:  Clear.  HEART:  Regular rate and rhythm, normal S1, S2.  ABDOMEN:  Soft.  EXTREMITY EXAM:  No cyanosis, clubbing or edema.   EKG:  Atrial-paced rhythm, otherwise normal tracing.   PROBLEMS:  1. Dizziness, resolved.  2. Vessel coronary artery disease.  3. History of paroxysmal atrial fibrillation in normal sinus rhythm on      amiodarone with atrial pacing.  4. Sinus node dysfunction status post Medtronic dual-chamber      pacemaker.  5. History of hypercalcemia resolved post discontinuation of      supplemental calcium.  6. Hypertension.  7. Dyslipidemia.   PLAN:  1. The patient will be referred for liver function tests as well as      lipid panel on Crestor.  2. TSH will be rechecked.  3. From a cardiovascular perspective, the patient is otherwise stable.   FOLLOW UP:  In six months.     Learta Codding, MD,FACC  Electronically Signed    GED/MedQ  DD: 11/28/2007  DT: 11/28/2007  Job #: [id number]   cc:   Doreen Beam, MD

## 2011-02-09 NOTE — Assessment & Plan Note (Signed)
[facility]                          [facility]   MARGAUX, ENGEN                        MRN:          [id number]  DATE:04/04/2007                            DOB:          1925-12-26    REFERRING PHYSICIAN:  Dhruv Vyas   HISTORY OF PRESENT ILLNESS:  Patient is an elderly female with a history  of ischemic heart disease.  The patient most recently started developing  swelling and rash on the lower extremities, attributed to Norvasc.  The  Norvasc was started for unstable angina, which has much improved on the  latter therapy.  The patient already cut her dose from 10 to 5 mg of  Norvasc and was given diuretic therapy by Dr. Sherril Croon.  She states that the  swelling has improved, but she still has an allergic rash and a  burning in the lower extremities.   MEDICATIONS:  See medication list and the last note.   PHYSICAL EXAMINATION:  VITAL SIGNS:  Blood pressure 113/69, heart rate  79.  Weight is 154 pounds.  NECK:  Normal carotid upstrokes.  No carotid bruits.  LUNGS:  Clear breath sounds bilaterally.  HEART:  Regular rate and rhythm.  Normal S1 and S2.  ABDOMEN:  Soft.  EXTREMITIES:  No clubbing, cyanosis or edema.  NEURO:  Patient is alert and oriented, grossly nonfocal.   PROBLEMS:  1. Chronic stable angina.      a.     Complete resolution on Norvasc.  2. Two-vessel coronary artery disease.      a.     Recommended medical therapy at catheterization in February,       2004.  3. Paroxysmal atrial fibrillation.      a.     Normal sinus rhythm on amiodarone drug therapy.      b.     Chronic Coumadin therapy.  4. Sinus node dysfunction.      a.     Status post Medtronic dual-chamber pacemaker implantation,       performed by Dr. Graciela Husbands.  5. Treated hypothyroidism.      a.     Recent normalization of TSH.  6. Hypertension, well controlled.  7. Hyperlipidemia.  8. Edema and rash possibly secondary to Norvasc.   PLAN:  1. I  have asked the patient to increase her Imdur to 30 mg a day;      however, she is afraid that will give her a headache.  I have told      if it does, we will cut it back to 15 mg a day.  2. If the patient tolerates Imdur, we can then discontinue Norvasc if      she continues to have symptoms in one week.  3. If the patient neither tolerates the up-titration of Imdur and she      has a continued rash and burning in the lower extremities, which is      felt to be related to Norvasc, then we will start Ranexa therapy.  4. Patient will be seen in one week.     Vernie Shanks  DeGent, MD,FACC  Electronically Signed    GED/MedQ  DD: 04/04/2007  DT: 04/04/2007  Job #: [id number]   cc:   Doreen Beam

## 2011-02-09 NOTE — Assessment & Plan Note (Signed)
[facility]                          Melanie [name] OFFICE NOTE   Melanie [name], Melanie [name]                        MRN:          [id number]  DATE:07/05/2008                            DOB:          12-05-1925    PRIMARY CARDIOLOGIST:  Learta Codding, MD, [facility]   PRIMARY ELECTROPHYSIOLOGIST:  Duke Salvia, MD, [facility]   REASON FOR VISIT:  Scheduled followup.   Since last seen here in the clinic by me in November 2008, Ms. Cowger  continues to do extremely well from a cardiovascular standpoint.  She  denies any interim development of signs/symptoms suggestive of unstable  angina pectoris or congestive heart failure.  She also denies any  development of tachy palpitations.   Although she presents with a 6-pound weight gain, she denies any PND,  orthopnea, or significant lower extremity edema.   Electrocardiogram today reveals atrial pacing with ventricular sensing  at 70 bpm.   CURRENT MEDICATIONS:  1. Coumadin 2.5 mg, as directed.  2. Aspirin 81 daily.  3. Amlodipine 5 daily.  4. Amiodarone 100 daily.  5. Isosorbide 15 daily.  6. Levothyroxine 0.05 daily.  7. Zetia 10 daily.  8. Evista 60 daily.  9. Metoprolol 25 b.i.d.  10.Crestor 5 daily.  11.Protonix 40 daily.   PHYSICAL EXAMINATION:  VITAL SIGNS:  Blood pressure 141/79, pulse 80,  regular, weight 160.8 (up 6).  GENERAL:  An 77 year old female, sitting upright, in no distress.  HEENT:  Normocephalic, atraumatic.  NECK:  Palpable carotid pulses without bruits; no JVD at 90 degrees.  LUNGS:  Clear to auscultation in all fields.  HEART:  Regular rate and rhythm.  No significant murmurs.  No rubs or  gallops.  ABDOMEN:  Soft, nontender.  EXTREMITIES:  No significant edema.  NEURO:  No focal deficit.   IMPRESSION:  1. Two-vessel coronary artery disease, quiescent.      a.     70% left anterior descending artery disease and well       collateralized, 100% right coronary artery by cardiac    catheterization in 2004.      b.     Low-risk adenosine Cardiolite with noted ischemia in small       area of mid inferior wall; ejection fraction 70%, October 2008.  2. Normal ventricular function.  3. History of paroxysmal atrial fibrillation.      a.     Maintaining his normal sinus rhythm, on amiodarone.      b.     Chronic Coumadin.  4. Sinus node dysfunction.      a.     Status post Medtronic dual-chamber pacemaker, followed by       Dr. [name].  5. Hypertension, stable.  6. Dyslipidemia, well controlled.  7. Hypothyroidism.   PLAN:  1. Continue current medication regimen.  2. Schedule return clinic followup with myself and Dr. Andee Lineman in 6      months.  The patient is also to maintain regular followup with Dr.      [name] here in our Pacemaker Clinic, as previously scheduled.  Gene Serpe, PA-C  Electronically Signed      Learta Codding, MD,FACC  Electronically Signed   GS/MedQ  DD: 07/05/2008  DT: 07/06/2008  Job #: [id number]

## 2011-02-09 NOTE — Cardiovascular Report (Signed)
[facility]                   [facility]IC NOTE   BRENT, NOTO                        MRN:          [id number]  DATE:07/10/2007                            DOB:          [birth year]    DEVICE CLINIC NOTE:  Ms. Massimino was seen today in the [facility] device clinic  for follow-up of her Medtronic Kappa pacemaker.  Her device was  implanted on May 24, 2003, for PAF.  Interrogation of her device  demonstrates P waves of 1-1.4 mV with an atrial impedance of 448 Ohms  and a threshold of 0.5 V at 0.4 msec.  Her R waves measured 11.2-15.68  mV with an RV impedance of 774 Ohms and a threshold of 0.75 V at 0.4  msec.  Her battery voltage was 2.78 V.  She is in sinus bradycardia  today and is not pacemaker-dependent.  She is programmed VVIR with a low  rate of 60, upper rate of 100 with a paced AV of 300 msec.  She is  currently A-pacing 95% of the time, V-pacing 0.6% of the time.  She does  TTM with [employer] and will return to clinic in 6 months.      Gypsy Balsam, RN,BSN  Electronically Signed      Duke Salvia, MD, [facility]  Electronically Signed   AS/MedQ  DD: 07/10/2007  DT: 07/11/2007  Job #: [id number]

## 2011-02-09 NOTE — Assessment & Plan Note (Signed)
[facility]                          [facility] OFF[facility] NOTE   Melanie [name], Melanie [name]                        MRN:          [id number]  DATE:06/20/2007                            DOB:          Jun 19, 1926    PRIMARY CARDIOLOGIST:  Learta Codding, MD, [facility]   REASON FOR VISIT:  Scheduled three month followup.   Melanie [name] returns to the clinic after last seen by Dr.  Andee Lineman here on  July 8th for continued management of chronic stable angina, history of  paroxysmal atrial fibrillation-on amiodarone, chronic Coumadin, and  permanent pacemaker secondary to sinus node dysfunction.   The patient recently walked into the clinic on September 10th  complaining of feeling weak on her current medication regimen, and was  instructed to stop the 15 mg of Imdur which she most recently had been  on. This was down titrated from an initial increased dose of 30 mg, by  Dr.  Andee Lineman, when last seen in the office.   However, the patient reports to me today that after stopping this for 24  hours she noted an increase in her systolic pressure to the 130-140  range. She became concerned and went back on her Imdur, noting  subsequent improvement in her blood pressures.   Clinically, she denies any chest pain, exertional dyspnea or tachy  palpitations. She also denies any PND or orthopnea or significant lower  extremity edema. She does complain of however is chronic fatigue. She  feels sluggish and sleepy all day. She has no known history of  obstructive sleep apnea.   Most recent blood work: Normal thyroid profile with TSH 2.69 and free T4  0.82 in May 2008. Of note, however, a free T3 on May 23 was low at 1.9.  Creatinine at 1.2, BUN 19 and sodium 4.1. CBC at that time notable for  normocytic anemia with a hemoglobin of 10, hematocrit 29.   CURRENT MEDICATIONS:  1. Isosorbide mononitrate 15 mg daily.  2. Amlodipine 5 mg daily.  3. Amiodarone 100 mg daily.  4.  Metoprolol 25 mg b.i.d.  5. Aspirin 81 daily.  6. Levoxyl 0.05 daily.  7. Evista.  8. Zetia 10 daily.  9. Coumadin 2.5 as directed.  10.Calcium 600 b.i.d.  11.Fish 1000 daily.   PHYSICAL EXAMINATION:  Blood pressure 132/80, pulse 75 and regular.  Weight 156.8 (up two pounds).  GENERAL: A 77 year old female, mildly obese, sitting upright in no  distress.  HEENT: Normocephalic, atraumatic.  NECK: Palpable bilateral carotid pulses; no bruits; no JVD at 90  degrees.  LUNGS:  Clear to auscultation in all fields.  HEART: Regular rate and rhythm (S1, S2). No significant murmurs.  ABDOMEN: Soft, nontender.  EXTREMITIES: Trace pedal edema.  NEURO: No focal deficit.   IMPRESSION:  1. Chronic fatigue.  2. History of paroxysmal atrial fibrillation.      a.     NSR on amiodarone drug therapy.      b.     Chronic Coumadin.  3. Two vessel coronary artery disease.      a.  Recommended medical therapy by cardiac catheterization,       February 2004.  4. Sinus node dysfunction.      a.     Status post Medtronic dual chamber pacemaker. Followed by       Dr.  [name].  5. Treated hypothyroidism.  6. Hypertension, stable.  7. Dyslipidemia.  8. Normocytic anemia.   PLAN:  1. Proceed with extensive blood work consisting of a CMET, CBC, and a      complete thyroid profile for further evaluation of persistent      fatigue.  2. Repeat EKG today shows maintenance of normal sinus rhythm with      atrial pacing at 63 beats per minute. Therefore, I doubt that her      fatigue is explained by intermittent paroxysmal atrial      fibrillation. Moreover, she states that she can tell when it is      out of rhythm.  3. Schedule early return clinic followup with myself and Dr.  Andee Lineman      in 4-6 weeks for review of blood test results and further      recommendations. Of note, if her followup CBC shows persistent      anemia, I would recommend further evaluation starting with      hemoccult  cards and a referral for a formal GI evaluation. This is      particularly in light of the fact that the patient is      on chronic Coumadin, as well as low dose aspirin, and we may need      to consider discontinuing the latter if she demonstrates a      persistent anemia.      Gene Serpe, PA-C  Electronically Signed      Learta Codding, MD,FACC  Electronically Signed   GS/MedQ  DD: 06/20/2007  DT: 06/20/2007  Job #: [id number]   cc:   Melanie [name]

## 2011-02-12 NOTE — Op Note (Signed)
NAME:  Melanie [name], Melanie [name]                        ACCOUNT NO.:  [id number]   MEDICAL RECORD NO.:  [id number]                   PATIENT TYPE:  INP   LOCATION:  3738                                 FACILITY:  [facility]   PHYSICIAN:  Duke Salvia, M.D.               DATE OF BIRTH:  [birth year]   DATE OF PROCEDURE:  05/24/2003  DATE OF DISCHARGE:                                 OPERATIVE REPORT   PREOPERATIVE DIAGNOSIS:  Paroxysmal atrial fibrillation with bradycardia,  symptomatic.   POSTOPERATIVE DIAGNOSIS:  Paroxysmal atrial fibrillation with bradycardia,  symptomatic.   PROCEDURE:  Dual chamber pacemaker implantation.   DESCRIPTION OF PROCEDURE:  After obtaining informed consent the patient was  brought to the electrophysiology laboratory  and placed on the fluoroscopic  table in the supine position. After routine prep and drape of the left upper  chest, intravenous contrast was injected via the left antecubital vein to  identify the course and the patency of the extrathoracic left subclavian  vein. This having been accomplished, Lidocaine was infiltrated in the  prepectoral subclavicular region.   An excision was made and was carried down to the layer of the prepectoral  fascia using electrocautery. A pocket was formed using electrocautery and  sharp dissection. Hemostasis was achieved.   Thereafter attention was turned to gaining access to the extrathoracic left  subclavian vein which was accomplished without difficulty without the  aspiration of air or puncture of  the artery. Two separate venipunctures  were accomplished. A 2-0 silk suture was placed in a figure-of-8 fashion and  allowed to hang loosely.   Subsequently tear-away introducer sheaths where placed through which was  passed a Medtronic 4092, 52-cm passive fixation ventricular lead, serial  number [id number] and a Pacesetter passive fixation 1642 serial number  [id number]. Under fluoroscopic guidance these were  manipulated into the right  ventricular apex and the right atrial appendage respectively, where the  bipolar R-wave was 10.7 millivolts with a pacing impedence of 601 ohms and a  pacing threshold of 0.5 volts at 0.5 milliseconds with a current threshold  of 1.0 MA. There was no diaphragmatic pacing at 10 volts.   The bipolar P-wave was 2.3 millivolts with a pacing impedence of 373 ohms, a  pacing threshold of 0.6 volts at 0.5 milliseconds and a current threshold of  1.6 MA. Again there was no diaphragmatic pacing at 10 volts.   With these acceptable parameters recorded, the leads were secured to the  prepectoral fascia and then attached to a Medtronic Kappa KDR701 pulse  generator, serial number [id number]. Ventricular pacing and then AV pacing  with fusion was identified. The pocket was copiously irrigated with  antibiotic containing saline solution. Hemostasis was achieved. The leads in  the posterior end were then placed in the pocket and secured to the  prepectoral fascia.   The wound was closed in 3 layers in the normal fashion. The  wound was  washed, dried and a Benzoin and Steri-Strip dressing was applied. All  sponge, instrument and needle counts were correct at the end of the  procedure according to the staff. The patient tolerated the procedure well  without apparent complication.                                               Duke Salvia, M.D.    SCK/MEDQ  D:  05/24/2003  T:  05/25/2003  Job:  [id number]   cc:   Bonney Roussel, M.D.   [facility] Device Clniic   Electrophysiology Lab

## 2011-02-12 NOTE — Assessment & Plan Note (Signed)
[facility]                           ELECTROPHYSIOLOGY OFFICE NOTE   Melanie [name], Melanie [name]                        MRN:          [id number]  DATE:05/19/2006                            DOB:          26-Jul-1926    [name] is seen in the device clinic today, May 19, 2006, for routine  follow-up of her Medtronic Kappa 701 dual chamber pacemaker implanted August  of 2004, for atrial fibrillation.  Upon interrogation, she is programmed in  a DDIR mode.  Battery voltage measures 2.79 volts.  In the atrium, intrinsic  amplitude is 1.4 mV with an impedance of 474 ohms and threshold of 0.5 volts  at 0.4 msec.  In the ventricle, intrinsic amplitude is 11.2 mV with an  impedance of 769 ohms and threshold of 0.75 volts at 0.4 msec.  She is  pacing in the atrium 99% of the time and pacing in the ventricle 2.6% of the  time.  Her adaptive V capture was turned on.  No other changes were made.  She will be started on teletraces through [employer] and be seen in one year's  time for follow-up in the [facility].                                   Cleatrice Burke, RN                                Duke Salvia, MD, [facility]   CF/MedQ  DD:  05/19/2006  DT:  05/19/2006  Job #:  [id number]

## 2011-02-12 NOTE — Cardiovascular Report (Signed)
NAME:  Melanie [name], Melanie [name]                        ACCOUNT NO.:  [id number]   MEDICAL RECORD NO.:  [id number]                   PATIENT TYPE:  INP   LOCATION:  2024                                 FACILITY:  [facility]   PHYSICIAN:  Salvadore Farber, M.D. [facility]         DATE OF BIRTH:  18-Apr-1926   DATE OF PROCEDURE:  11/12/2002  DATE OF DISCHARGE:                              CARDIAC CATHETERIZATION   PROCEDURES:  1. Left heart catheterization.  2. Left ventriculography.  3. Coronary angiography.   INDICATIONS:  The patient is a 77 year old lady who remains quite active,  walking for 30 minutes on a treadmill every day.  At peak exertion she has  mild left chest discomfort.  She is now hospitalized after an episode of  atrial fibrillation with rapid ventricular response, during which she  suffered a small non-ST segment elevation myocardial infarction with  troponin rising to 0.29.  She was therefore referred for diagnostic  angiography.   DIAGNOSTIC TECHNIQUE:  Informed consent was obtained.  Under 1% lidocaine  local anesthesia, a 6 French sheath was placed in the right femoral artery  using the modified Seldinger technique.  Diagnostic angiography was  performed using JL4 and JR4 catheters.  Ventriculography was performed in  the RAO projection using a pigtail catheter.  The patient tolerated the  procedure well and was transferred to the holding room in stable condition.   COMPLICATIONS:  None.   FINDINGS:  1. LV:  123/2/7.  EF 70% without regional wall motion abnormality.  2. No aortic stenosis.  Unable to assess mitral regurgitation due to     ventricular ectopy.  3. Left main:  Angiographically normal.  4. LAD:  The LAD is a large vessel giving rise to two moderate-sized     diagonal branches.  The proximal and mid-LAD is diffusely diseased and     heavily calcified with up to 70% stenosis.  The distal LAD is a     relatively small (2 mm) vessel.  The first diagonal has a 90%  stenosis     proximally.  This too is a small vessel.  The second diagonal branch has     a 60% stenosis proximally.  5. Circumflex:  The circumflex is a moderate-sized vessel giving rise to     three obtuse marginal branches.  The very small first obtuse marginal     branch has a 90% stenosis proximally.  There is no substantial disease in     the remainder of the circumflex territory.  6. RCA:  The RCA is occluded proximally.  The PDA and PLV are well-     collateralized from the left side.  There is a 60% stenosis of the     proximal portion of the PDA.   IMPRESSION/RECOMMENDATIONS:  The patient has substantial two-vessel coronary  disease including complex 70% stenosis of the left anterior descending and a  well-collateralized, occluded right coronary artery.  With  her minimal symptoms, preserved left ventricular systolic function, and  rather advanced age, will plan on a trial of medical therapy.  Diltiazem has  been added to her regimen to improve rate control of her atrial  fibrillation.  Nitroglycerin will also be added to enhance collateral supply  to her RCA.                                               Salvadore Farber, M.D. [facility]    WED/MEDQ  D:  11/12/2002  T:  11/12/2002  Job:  [id number]   cc:   Dr. Aundra Millet, [place]   Jonelle Sidle, M.D. [facility]

## 2011-02-12 NOTE — Discharge Summary (Signed)
NAME:  Melanie [name], Melanie [name]                        ACCOUNT NO.:  [id number]   MEDICAL RECORD NO.:  [id number]                   PATIENT TYPE:  INP   LOCATION:  2024                                 FACILITY:  [facility]   PHYSICIAN:  Salvadore Farber, M.D. [facility]         DATE OF BIRTH:  Aug 15, 1926   DATE OF ADMISSION:  11/11/2002  DATE OF DISCHARGE:  11/13/2002                           DISCHARGE SUMMARY - REFERRING   DISCHARGE DIAGNOSES:  1. Coronary artery disease, medical treatment.  2. Paroxysmal atrial fibrillation, currently in normal sinus rhythm.  3. Long-term Coumadin use.  4. Hypertension, treated.  5. Dyslipidemia.   HISTORY OF PRESENT ILLNESS:  The patient is a 77 year old female patient of  Dr. Elby Beck who presented to [facility] after experiencing a  sudden onset of rapid irregular heart rate the day before admission.  She  denied any chest pain, shortness of breath, or diaphoresis.  She presented  to the emergency room and was started on IV Cardizem.  A cardiology consult  was obtained, and initially the patient spontaneously converted to normal  sinus rhythm on Cardizem.  Unfortunately, her troponins were initially  negative but increased to a positive level of 0.29.   In addition, she had had an abnormal Cardiolite in July 2003, which showed a  moderate-sized inferior reversible defect.  At that time the patient had a  good exercise tolerance and did not report any substernal chest pain and  elected for medical treatment.  Given her current complaints and low  positive troponin, she was transferred to [facility] on November 11, 2002, for cardiac catheterization.   HOSPITAL COURSE:  She underwent cardiac catheterization on November 12, 2002, and was found to have left main angiogram to be normal, LAD with a  proximal and mid 70% stenosis, first diagonal 90% proximal stenosis, second  diagonal with a 60% proximal stenosis, circumflex proximal 90%, RCA  occluded  proximally with collaterals.  There was a 60% proximal PDA lesion.  The  patient's EF was well preserved, and with her minimal symptoms and advanced  age, Dr. Samule Ohm thought it was prudent to treat her medically.  She remained  stable overnight, and plans were for her to go home on November 13, 2002.  At discharge her pulse was 76, blood pressure 120/60, O2 saturations 96% on  room air, and she remained afebrile.  At this point we have advanced her  medications and have restarted her Coumadin.  She is currently in normal  sinus rhythm and will have a follow-up in the Coumadin clinic on Friday,  November 16, 2002, and have a follow-up appointment in the office the  following week.   LABORATORY DATA:  Laboratory studies during her hospital stay included a  sodium of 136, potassium 3.6, BUN 20, creatinine 0.9.  Total cholesterol  169, triglycerides 167, HDL 50, LDL 86.  TSH 5.955.   DISCHARGE MEDICATIONS:  She will be discharged  to home on the following  medications.  1. Cozaar 50 mg a day.  2. Diltiazem 240 mg 1 p.o. daily.  3. Coumadin 2.5 mg 1 p.o. daily.  4. Zetia 10 mg a day.  5. Baby aspirin 81 mg a day.  6. Imdur 30 mg 1 p.o. daily.  7. Sublingual nitroglycerin p.r.n. chest pain.  8. Tylenol 1 or 2 tablets q.6h. p.r.n. pain.   DISCHARGE INSTRUCTIONS:  No strenuous activity for two days, then gradually  increase activity, remain on a low fat diet _______.  Call for questions or  concerns.  Our office will contact the patient for a follow-up appointment  for a pro time on November 16, 2002, a follow-up next week for groin check  and blood pressure evaluation.     Guy Franco, P.A. [facility]                      Salvadore Farber, M.D. [facility]    LB/MEDQ  D:  11/13/2002  T:  11/13/2002  Job:  [id number]   cc:   [facility]  [address]  [place], [place] [address]   Wende Crease, M.D.  [address]  [place], [place] [address]

## 2011-02-12 NOTE — Assessment & Plan Note (Signed)
[facility]                            [facility] NOTE   MACKENSI, MAHADEO                        MRN:          [id number]  DATE:06/20/2006                            DOB:          [birth year]    HISTORY OF PRESENT ILLNESS:  The patient is a 77 year old female with a  history of paroxysmal atrial fibrillation, currently remaining in normal  sinus rhythm.  The patient reports no palpitations, chest pain, shortness of  breath.  She was recently seen in August 2007.  It is not entirely clear why  the patient is back here for a followup.  She has developed no interim  problems.  She does remain concerned about the use of amiodarone, but I have  reassured her that she is on low dose.  She will have laboratory work drawn  today.   MEDICATIONS:  1. Amiodarone 100 mg p.o. q. day.  2. Isosorbide 50 mg p.o. q. day.  3. Metoprolol 25 mg p.o. b.i.d.  4. Aspirin 81 mg p.o. q. day.  5. Levoxyl 50 mg p.o. q. day.  6. Evista 60 mg q. day.  7. Zetia 10 mg p.o. q. day.  8. Warfarin 2.5 mg __________.  9. Vitamin D.  10.Multivitamin.  11.Calcium.  12.Lipitor 20 mg p.o. q. day.  13.Metamucil.   PHYSICAL EXAMINATION:  VITAL SIGNS:  Blood pressure is 130/60, heart rate is  66 beats per minute.  NECK:  Normal carotid upstroke.  No carotid bruits.  LUNGS:  Clear breath sounds bilaterally.  HEART:  Regular rate and rhythm.  Normal S1 and S2.  No murmurs, rubs, or  gallops.  ABDOMEN:  Soft.  EXTREMITIES:  No cyanosis, clubbing, or edema.   PROBLEM LIST:  1. Status post permanent pacemaker implantation.  2. History of atrial fibrillation in normal sinus rhythm.  3. Multivessel coronary artery disease catheterization in 2004, on medical      therapy.  4. Hypertension, controlled.  5. On amiodarone therapy.  6. Coumadin anticoagulation.   PLAN:  1. The patient will have a CBC, CMET, TSH, INR, and lipids done today.  2. The patient continues to  have dyslipidemia, but has been unable to      tolerate Statin drug therapy.  She is now on Zetia, but added the      addition of red yeast rice.  We will review on lipid panel.  3. The patient does not need any ischemic testing.  She appears to have no      palpitations and remains in normal sinus rhythm.  She can continue on      anticoagulation.                                   Learta Codding, MD,FACC   GED/MedQ  DD:  06/20/2006  DT:  06/22/2006  Job #:  [id number]   cc:   Wende Crease

## 2011-02-12 NOTE — Letter (Signed)
January 13, 2007    Learta Codding, MD,FACC  [address]  [place], [place] [address]   RE:  KUSHI, KUN  MRN:  [id number]  /  DOB:  1926/06/26   Dear Michelle Piper,   Waldon Merl came in today for her pacemaker followup, implanted for  sinus node dysfunction and paroxysmal atrial fibrillation.  Pacemaker is  functioning fine.   She complains of a couple of things, though.  The first is a misery  that occurs with walking, echoes across her shoulders bilaterally  sometimes into her back.  It is relieved by rest.  It has been going on  for about a month and getting somewhat worse.   She also complains of discomfort in her thighs, particularly her left  thigh which is also aggravated at rest.   In addition, she has a complaint of generalized fatigue that has been  going on for some time.   Review of her medications raises the specter that it may be the  metoprolol 25 b.i.d.  She also takes amiodarone.  She is still on  thyroid replacement and recently had meloxicam started for what Dr. Sherril Croon  thought was arthritis.  Review of her laboratories demonstrates that she  had her amiodarone labs checked last in November 2007.   PHYSICAL EXAMINATION:  VITAL SIGNS:  Blood pressure 128/58, pulse 68.  LUNGS:  Clear.  HEART:  Sounds were regular.  EXTREMITIES:  Without edema.   Interrogation of her Medtronic Kappa pulse generator demonstrated a P  wave of 1.4, the impedance of 467, threshold of 0.5 and 0.4.  The R wave  was 11.2 with an impedance of 739, a threshold of 0.75 and 0.4.  She had  atrial pace about 95% of the time.   IMPRESSION:  1. Sinus node dysfunction and paroxysmal atrial fibrillation.  2. Status post pacer for #1.  3. Status post amiodarone for #1.  4. Ischemic heart disease with moderate left anterior descending      disease, a 90% diagonal lesion.  5. Total occlusion of her right coronary artery with left-to-right      collaterals.  6. Lower extremity aching.   Michelle Piper,  I am concerned that Ms. Weigold's misery is angina.  I am  concerned that her coronary disease is sufficiently wide spread enough  that Myoview testing would not be clarifying, and I suggested to her  that catheterization might be the only way to do this.  She is to see  you next week and, as such, I have asked that she follow up with you  about that.   I have also taken the liberty of going ahead and ordering a sed rate as  well as amiodarone surveillance laboratories, the former to make sure  she does not have polymyalgia.   I also wonder whether maybe some of her fatigue is not coming from her  beta blockers and trying alternative beta blockers might not be of  value.   Michelle Piper, thanks very much for asking [place] to see her.  We will see her again  in 1 year for her device followup.    Sincerely,      Duke Salvia, MD, [facility]  Electronically Signed    SCK/MedQ  DD: 01/13/2007  DT: 01/13/2007  Job #: [id number]   CC:    Doreen Beam

## 2011-02-12 NOTE — Discharge Summary (Signed)
NAME:  Melanie [name], Melanie [name] NO.:  [id number]   MEDICAL RECORD NO.:  [id number]                   PATIENT TYPE:  INP   LOCATION:  3738                                 FACILITY:  [facility]   PHYSICIAN:  Duke Salvia, M.D.               DATE OF BIRTH:  Jan 26, 1926   DATE OF ADMISSION:  05/22/2003  DATE OF DISCHARGE:  05/25/2003                           DISCHARGE SUMMARY - REFERRING   BRIEF HISTORY:  This is a pleasant 77 year old female who was admitted to  [facility] on May 22, 2003 by Dr. Graciela Husbands.  The patient was  admitted with recurrent symptomatic junctional bradycardia with presyncope.  She has a history of paroxysmal atrial fibrillation with rapid ventricular  response, treated in the past with amiodarone.  It was felt that the patient  would need a permanent pacemaker, and she was admitted for further  evaluation.   PAST MEDICAL HISTORY:  Please see history as noted above.  The patient also  has a history of three-vessel coronary artery disease, by catheterization  earlier this year.  She has normal LV function.  She is on chronic Coumadin  therapy.  She has a history of elevated blood sugars.  Records were somewhat  incomplete.  We are still waiting for [facility]  [place].  The patient was a difficult historian.   ALLERGIES:  No known drug allergies.   MEDICATIONS PRIOR TO ADMISSION:  Included Cozaar, atenolol, Aciphex,  amiodarone, Coumadin, aspirin and Zetia.   HOSPITAL COURSE:  As noted, this patient was admitted to [facility]  on May 22, 2003 with a history of paroxysmal atrial fibrillation with  rapid ventricular response, treated with amiodarone, with subsequent  junctional bradycardia, with presyncope.  The patient had carotid Dopplers  performed that showed no internal carotid artery stenosis bilaterally.  It  was felt that the patient would need a permanent pacemaker secondary to her  bradycardia.   On May 24, 2003, the patient had a permanent pacemaker implanted by Dr.  Graciela Husbands, which I believe was a Kappa generator.  Patient tolerated this well.  Arrangements were made to discharge the patient home the following day.   LABORATORY DATA:  A CBC on May 23, 2003 revealed hemoglobin 10.4,  hematocrit 31.7, WBCs 8.1, platelets 226,000.  A chemistry profile on May 23, 2003 revealed BUN 22, creatinine 1.4, potassium 4.3, glucose 126.  Cardiac enzymes were negative x 1.  Hemoglobin A1c was 6.2, which is mildly  elevated.  A chest x-ray on May 24, 2003 showed mild cardiac enlargement,  no CHF, no pneumonia, question small effusion, likely on the left.  There  were also changes consistent with COPD.   DISCHARGE MEDICATIONS:  The patient was told to continue her home  medications as before, with the addition of atenolol at 50 mg daily, which  was twice the previous dose.  The  patient was concerned that her blood  pressure might run low on these medications.  She was told it was okay to  decrease her Cozaar to 50 mg per day instead of 100 mg per day, although she  would need, early, follow up in the office to further evaluate her blood  pressure.  She was told she could take Tylenol as needed for pain.  Other  medications have included Cozaar 100 mg daily; amiodarone dosage not  available; Coumadin dosage not available, aspirin, Zetia and Aciphex.   ACTIVITY:  The patient was told to avoid any strenuous activity.  She was  given instructions as far as limited use of her arms.   DIET:  She was to continue the same diet she was on prior to admission.   FOLLOW UP:  1. She was to follow up with Dr. Doyne Keel as scheduled or as needed.  2. She was told to call Monday for an appointment to see Dr. Andee Lineman within     the next week or two.  3. She was to have her pro time blood test her next office visit, and she     was to have her blood pressure re-evaluated.   PROBLEM  LIST AT TIME OF DISCHARGE:  #1.  HISTORY OF PAROXYSMAL ATRIAL  FIBRILLATION WITH RAPID VENTRICULAR RESPONSE.   #2.  SYMPTOMATIC JUNCTIONAL BRADYCARDIA WITH RATES IN THE 30s, WITH  PRESYNCOPE.   #3.  STATUS POST, KAPPA PERMANENT PACEMAKER:  Placed May 24, 2003.   #4.  ANEMIA.   #5.  HISTORY OF THREE-VESSEL CORONARY ARTERY DISEASE.   #6.  HISTORY OF NORMAL LEFT VENTRICLE FUNCTION.   #7.  CHRONIC COUMADIN THERAPY.   #8.  HISTORY OF ELEVATED BLOOD GLUCOSE, WITH MILDLY ELEVATED hemoglobin A1c.   #9.  NEGATIVE CAROTID DOPPLERS.      Delton See, P.A. [facility]                  Duke Salvia, M.D.    DR/MEDQ  D:  05/25/2003  T:  05/26/2003  Job:  [id number]   cc:   [facility]  [address]  [address]  [place]  [place] [address]   Doyne Keel, M.D.  [place]  [facility]

## 2011-02-12 NOTE — H&P (Signed)
NAME:  Melanie [name], Melanie [name] NO.:  [id number]   MEDICAL RECORD NO.:  [id number]                   PATIENT TYPE:  INP   LOCATION:  3738                                 FACILITY:  [facility]   PHYSICIAN:  Duke Salvia, M.D.               DATE OF BIRTH:  1926-09-19   DATE OF ADMISSION:  05/22/2003  DATE OF DISCHARGE:                                HISTORY & PHYSICAL   HISTORY OF PRESENT ILLNESS:  The patient is transferred from [facility] where she presents with symptomatic bradycardia.  She is a 77-year-  old lady with known three vessel coronary artery disease by catheterization  February 2004, that was elected to be treated medically.  She had normal  left ventricular function.  This was undertaken after she had presented with  atrial fibrillation with a rapid ventricular response, rate control was  initiated.  She had positive troponins, hence, the catheterization in the  context of a previously demonstrated abnormal Cardiolite scan.  Subsequently, she was started on amiodarone and then for reasons that are  not clear, the patient was seen today and had atenolol added to her regimen.  As best as I can piece it together, which is only poorly, she was  complaining of some kind of spells which may have been dizziness or may  not have been dizziness.  In any case, following the initiation of the  atenolol, she started having significant presyncope this evening and  presented to the emergency room at [facility] where she was found to be in  junctional rhythm at about 35 beats per minute.  Her blood pressure was okay  in the 120 to 150 range.  She was transferred for further evaluation.  It  was really exceedingly difficult for me to get much more history out of her  than that.   The patient does not know her medications, though they apparently include  Cozaar, atenolol, Aciphex and amiodarone and Coumadin at least.  She does  not know her social security  number.  She does not acknowledge having any  drug allergies.  She was on IMDUR and this was stopped for some reason that  is not clear.   SOCIAL HISTORY:  She lives alone.  She has no children.  She has many family  up in the [place], [place], area.   PAST SURGICAL HISTORY:  Hysterectomy.   PHYSICAL EXAMINATION:  GENERAL APPEARANCE:  She is an elderly Caucasian  female in no acute distress who knows she is at [employer] but is poorly able to give much other history.  VITAL SIGNS:  Blood pressure 132/58, pulse 38.  HEENT:  Without icterus nor xanthomata.  NECK:  Neck veins were greater than 10 cm. Carotids were brisk.  LUNGS:  Clear laterally.  CARDIOVASCULAR:  Heart sounds were regular with diminished S1. There was no  significant murmur.  When I sat her up to listen to her chest, she became  very dizzy and had to be replaced in the recumbent supine position.  ABDOMEN:  Soft with active bowel sounds without midline pulsation or  tenderness.  EXTREMITIES:  Femoral pulses 2+, distal pulses intact.  No clubbing,  cyanosis, or edema.  NEUROLOGIC:  Grossly normal for motor and sensory function.  Her mental  status are as above.  SKIN:  Warm and dry.   LABORATORY DATA:  From [facility], sugar of 177, creatinine 1.7.  Troponin 0.02 and CK-MB was normal.  Other laboratories are not currently  available.   Electrocardiogram demonstrates a narrow QRS rhythm with cycle length of  about 1500 msec.  The intervals are -/0.7/0.46.  There is no discernible  atrial activity.   IMPRESSION:  1. Symptomatic junctional rhythm with recurrent presyncope, I think     antidating today.  2. History of paroxysmal atrial fibrillation with rapid ventricular     response, apparently on amiodarone with atenolol added today, question     see above.  3. Known three vessel coronary disease with normal left ventricular     function.  4. Chronic Coumadin.  5. Very difficult  history.  6. Elevated blood sugar.   DISCUSSION:  Mrs. Melanie [name] has significant bradycardia in the context of  antecedent history of atrial fibrillation with a rapid ventricular response.  I am not quite sure what happened today that prompted Dr. Andee Lineman to add  atenolol to her regimen and I do not know whether this aggravated her  symptoms or not, but clearly she has significant symptoms now related to her  bradyarrhythmia and will need back up brady pacing.   I doubt that this is ischemic.   PLAN:  Based on the above, we will:  1. Observe.  2. Would probably need pacemaker.  3. Serial enzymes.  4. Hold atenolol.  5. Check a hemoglobin A1C.  6. Get records from [place], [place].                                                Duke Salvia, M.D.    SCK/MEDQ  D:  05/22/2003  T:  05/23/2003  Job:  [id number]   cc:   Florene Glen, M.D.  [place], [place]   [facility]

## 2011-02-12 NOTE — Assessment & Plan Note (Signed)
[facility]                          [facility]ICE NOTE   Melanie, [name]                        MRN:          [id number]  DATE:01/19/2007                            DOB:          [birth year]    PRIMARY CARDIOLOGIST:  Dr. Vernie Shanks. [place]   REASON FOR VISIT:  Schedule 56-month followup.   Since last seen here in the clinic by Dr. Andee Lineman in September, 2007,  patient reports new onset anterior chest discomfort described as a  misery, but not as frank pain.  She also disagrees with the use of  descriptor such as tightness, pressure, or pain.   The patient described these symptoms to Dr. [name], with whom she  presented here in the [facility] for annual pacemaker  follow-up on  January 13, 2007.  Apparently, his recommendation was that she undergo  cardiac catheterization.   The patient does have documented coronary artery disease, having  undergone coronary angiography in February, 2004, for evaluation of  exertional chest discomfort, and apparently after she presented with PAF  with RVR with associated abnormal troponin's.  She was found to have  significant 2-vessel  CAD with 70% LAD and total occlusion of the RCA with distal  collateralization.  Left ventricular function was preserved and patient  was treated medically.   The patient has not had any subsequent repeat catheterization, nor any  stress-test.   The patient's symptoms are related to exertion only; she denies any  symptoms at rest. Specifically, she noted development of anterior chest  symptoms described as, misery which would extend from the bilateral  shoulders down the front of the chest, but with no radiation to the jaw  or extremities.  These would be precipitated only after a few minutes of  walking at the living facility where she resides, and as part of her  walking regimen.  She then points out, however, that after continued  walking, these symptoms would  resolve.  In fact, she would not stop  after the development of this mild discomfort.   The patient also is able to climb a flight of stairs with no associated  discomfort or dyspnea.  Most recently, however, she has not been very  active given that she developed some spontaneous bruising of her left  thigh.  She presented here to [facility] ER on January 14, 2007 and  was discharged from the ER with instructions to stop Mobic which she had  been taking for about 1 week prior.  She is also on aspirin and on  Coumadin, the latter for history of PAF.   The patient's blood work revealed an INR of 3.3 and a duplex ultrasound  of the lower extremity was done, revealing no evidence of DVT.   CURRENT MEDICATIONS:  1. Amiodarone 100 daily.  2. Isosorbide 15 daily.  3. Metoprolol 25 b.i.d.  4. Aspirin 81 daily.  5. Coumadin 2.5 mg as directed.  6. Levoxyl 50 mcg daily.  7. Evista 60 daily.  8. Calcium 600 b.i.d.   PHYSICAL EXAMINATION:  VITAL SIGNS:  Blood pressure 144/73,  pulse 65,  regular. Weight 157.2.  GENERAL:  A 77 year old female sitting upright in no distress.  HEENT:  Normocephalic, atraumatic.  NECK:  Palpable bilateral carotid pulses without bruits; no JVD.  LUNGS:  Clear to auscultation in all fields.  HEART:  Regular rate and rhythm, S1, S2, no significant murmurs.  ABDOMEN:  Protuberant, benign with intact bowel sounds.  EXTREMITIES:  Palpable bilateral femoral pulses without bruits; moderate  ecchymosis of the left inner thigh and posteriorly. There is no  hematoma.  Peripheral pulses are preserved with no significant edema.  NEURO:  No focal deficits.   IMPRESSION:  1. Exertional angina pectoris.  2. Known 2-vessel coronary artery disease.      a.     Status post cardiac catheterization in February, 2004, with       recommended medical therapy.  3. History of paroxysmal atrial fibrillation with rapid ventricular      response.      a.     Maintaining normal  sinus rhythm on amiodarone.  4. Sinus node dysfunction.      a.     Status post Medtronic dual chamber pacemaker implantation by       Dr. [name], August, 2004.  5. Chronic Coumadin.  6. Hypothyroidism.  7. Dyslipidemia.  8. Hypertension.   PLAN:  At this point in time, patient is not inclined to proceed with  diagnostic coronary angiography, as recommended. I reviewed the results  of her catheterization in 2004 and the patient appears to fully  understand the implications of this.  However, she has agreed to  continue aggressive medical management with close monitoring for any  recurrent symptoms.  Therefore, we will add Norvasc 5 daily to her  current regimen and not up titrate her Imdur, given her report today of  significant headaches associated with an increased dose.  I then  instructed her to resume her previous exercise regimen and to use  nitroglycerin on a p.r.n. basis, in the event of any persistent  symptoms.  She has never used nitroglycerin in the past and I instructed  her as to its use, including the need to proceed directly to the  emergency room in the event that she has chest pain unrelieved after  three nitroglycerin tablets.   The plan is to have her return to the to follow-up with me in 2 weeks  and, if she continues to have exertional symptoms, she then will proceed  with  diagnostic coronary angiography.  The patient appears to be  agreeable with this plan and will contact our office in the event that  her symptoms worsen.      Gene Serpe, PA-C       Learta Codding, MD,FACC    GS/MedQ  DD: 01/19/2007  DT: 01/19/2007  Job #: [id number]

## 2011-02-12 NOTE — Assessment & Plan Note (Signed)
[facility]                            [facility]ICE NOTE   Melanie [name], Melanie [name]                        MRN:          [id number]  DATE:04/28/2006                            DOB:          1926/04/12    PRIMARY CARE PHYSICIAN:  Melanie [name].   HISTORY:  Melanie [name] is a 77 year old white female who presents to our  office for followup in regards to her amiodarone therapy.  She states that  she is doing well from a cardiac standpoint.  She denies any problems with  chest discomfort, shortness of breath.  She does state that she has  occasional dizziness, but she did not feel this is positional.  She also  describes an unsteady gait, but she has not had falls.  This has been going  on for the past month, and she states that she has discussed this with Dr.  Doyne [name] but does not know what is happening to her.   Recent laboratory values in regards to her amiodarone showing TSH level of  5.27, which is down from January, which was 11.56.  Her LFTs are within  normal limits; however, it is noted that her Crestor was recently  discontinued secondary to muscle aching, which is since resolved, but her  fasting lipids drawn on April 01, 2006 show an increase in her triglycerides  to 226 from 128.  Her cholesterol is 192.  Her HDL is down to 38 from 45.  Her LDL was up from 58 to 109.   EKG in the office shows atrial pacing with a ventricular rate of 78 and a P-  R interval of 208, normal axis, nonspecific ST-T wave changes, left anterior  hemiblock, left axis deviation.   PAST MEDICAL HISTORY:  1.  Hyperlipidemia.  2.  Paroxysmal atrial fibrillation, status post pacer in 2004, asymptomatic.  3.  Three vessel coronary artery disease.  Last catheterization in 2004.      Continued medical treatment.  4.  Hypertension.   ALLERGIES:  No known drug allergies.   MEDICATIONS:  1.  Amiodarone 100 mg daily.  2.  Isosorbide 15 daily.  3.  Metoprolol 25 mg  b.i.d.  4.  Aspirin 81 daily.  5.  Levoxyl 50 mg daily.  6.  Evista 60 mg daily.  7.  Zetia 10 mg daily.  8.  Warfarin 2.5 mg daily.  Last INR was 2.1 on April 18, 2006.  9.  Vitamin D 800 IU daily.  10. Multivitamins daily.  11. Calcium 600 mg b.i.d.  12. Clonazepam 0.5 p.r.n.  13. Nitroglycerin p.r.n.   PHYSICAL EXAMINATION:  VITAL SIGNS:  Orthostatic blood pressures were  checked.  Lying down, these were 130/68 with a pulse of 66.  Sitting 132/74  with a pulse of 66.  Standing 128/74 with a pulse of 70.  At 4 minutes,  134/76.  She does not describe any dizziness.  GENERAL:  A well-developed and well-nourished pleasant white female in no  apparent distress.  HEENT:  Unremarkable.  NECK:  Supple without thyromegaly, adenopathy, JVD, or carotid bruits.  CHEST:  Symmetrical excursion.  Clear to auscultation.  HEART:  PMI is not displaced.  Regular rate and rhythm.  Did not appreciate  any murmurs, rubs, clicks, or gallops.  ABDOMEN:  Unremarkable.  EXTREMITIES:  Negative clubbing, cyanosis or edema.   IMPRESSION/PLAN:  Melanie [name] appears to be doing well from a cardiovascular  standpoint and amiodarone treatment for her paroxysmal atrial fibrillation.  In regards to her lipids, in reviewing the chart, it is not clear as to what  statins she has tried.  Melanie [name] is willing to try Lipitor at a low dose  for six weeks with reassessment of her LFTs and cholesterol.  She states  that she will call us if she develops any muscle aching.  She is asked to  follow up with Melanie [name] in regards to her dizziness and unsteady gait, as  she may need a neuro evaluation.  She will follow up in our office in  approximately two months in regards to her lipids.  She was encouraged to  maintain a low fat diet and a regular exercise program.                                   Melanie Rued, PA-C                                Melanie Codding, MD, [facility]   EW/MedQ  DD:  04/28/2006  DT:   04/28/2006  Job #:  [id number]   cc:   Melanie [name]

## 2011-03-05 ENCOUNTER — Other Ambulatory Visit: Payer: Self-pay | Admitting: *Deleted

## 2011-03-05 MED ORDER — WARFARIN SODIUM 2.5 MG PO TABS: 2.5000 mg | ORAL_TABLET | ORAL | Status: DC

## 2011-03-09 ENCOUNTER — Ambulatory Visit (INDEPENDENT_AMBULATORY_CARE_PROVIDER_SITE_OTHER): Payer: Medicare Other | Admitting: *Deleted

## 2011-03-09 DIAGNOSIS — I4891 Unspecified atrial fibrillation: Secondary | ICD-10-CM

## 2011-03-09 DIAGNOSIS — Z7901 Long term (current) use of anticoagulants: Secondary | ICD-10-CM

## 2011-03-09 MED ORDER — WARFARIN SODIUM 2.5 MG PO TABS: 2.5000 mg | ORAL_TABLET | ORAL | Status: DC

## 2011-03-29 ENCOUNTER — Telehealth: Payer: Self-pay | Admitting: *Deleted

## 2011-03-29 NOTE — Telephone Encounter (Signed)
Informed [place] to tell pt to decrease coumadin to 1.25mg  daily until pt finished Bactrim.  Appt made for INR check on 04/02/11.

## 2011-04-02 ENCOUNTER — Ambulatory Visit (INDEPENDENT_AMBULATORY_CARE_PROVIDER_SITE_OTHER): Payer: Medicare Other | Admitting: *Deleted

## 2011-04-02 DIAGNOSIS — I495 Sick sinus syndrome: Secondary | ICD-10-CM

## 2011-04-02 DIAGNOSIS — Z7901 Long term (current) use of anticoagulants: Secondary | ICD-10-CM

## 2011-04-02 DIAGNOSIS — I4891 Unspecified atrial fibrillation: Secondary | ICD-10-CM

## 2011-04-06 ENCOUNTER — Encounter: Payer: Medicare Other | Admitting: *Deleted

## 2011-04-20 ENCOUNTER — Ambulatory Visit (INDEPENDENT_AMBULATORY_CARE_PROVIDER_SITE_OTHER): Payer: Medicare Other | Admitting: *Deleted

## 2011-04-20 DIAGNOSIS — Z7901 Long term (current) use of anticoagulants: Secondary | ICD-10-CM

## 2011-04-20 DIAGNOSIS — I4891 Unspecified atrial fibrillation: Secondary | ICD-10-CM

## 2011-04-29 ENCOUNTER — Telehealth: Payer: Self-pay | Admitting: *Deleted

## 2011-04-29 NOTE — Telephone Encounter (Signed)
Patient walked into office - has place on leg that she wanted [place] to look at.  Patient had bruised area on left upper inner thigh.  States that she does not remember doing anything to leg.  States she was told to come to [place] because she was on coumadin.  Advised if therapeutic (INR 2.6 on 7/24) - that was not the cause.  No c/o swelling or warmth at site.  Advised her to see PMD for evaluation.

## 2011-05-03 ENCOUNTER — Other Ambulatory Visit: Payer: Self-pay | Admitting: *Deleted

## 2011-05-03 MED ORDER — AMIODARONE HCL 200 MG PO TABS: 100.0000 mg | ORAL_TABLET | Freq: Every day | ORAL | Status: DC

## 2011-05-04 ENCOUNTER — Other Ambulatory Visit: Payer: Self-pay | Admitting: *Deleted

## 2011-05-04 MED ORDER — AMLODIPINE BESYLATE 10 MG PO TABS: 5.0000 mg | ORAL_TABLET | Freq: Every day | ORAL | Status: DC

## 2011-05-18 ENCOUNTER — Ambulatory Visit (INDEPENDENT_AMBULATORY_CARE_PROVIDER_SITE_OTHER): Payer: Medicare Other | Admitting: *Deleted

## 2011-05-18 DIAGNOSIS — I4891 Unspecified atrial fibrillation: Secondary | ICD-10-CM

## 2011-05-18 DIAGNOSIS — Z7901 Long term (current) use of anticoagulants: Secondary | ICD-10-CM

## 2011-05-18 LAB — POCT INR: INR: 2.5

## 2011-06-01 ENCOUNTER — Encounter: Payer: Self-pay | Admitting: Internal Medicine

## 2011-06-03 ENCOUNTER — Encounter: Payer: Self-pay | Admitting: Internal Medicine

## 2011-06-03 ENCOUNTER — Telehealth: Payer: Self-pay | Admitting: *Deleted

## 2011-06-03 ENCOUNTER — Ambulatory Visit (INDEPENDENT_AMBULATORY_CARE_PROVIDER_SITE_OTHER): Payer: Medicare Other | Admitting: Internal Medicine

## 2011-06-03 DIAGNOSIS — I4891 Unspecified atrial fibrillation: Secondary | ICD-10-CM

## 2011-06-03 DIAGNOSIS — I495 Sick sinus syndrome: Secondary | ICD-10-CM

## 2011-06-03 DIAGNOSIS — Z950 Presence of cardiac pacemaker: Secondary | ICD-10-CM

## 2011-06-03 LAB — PACEMAKER DEVICE OBSERVATION
AL THRESHOLD: 0.5 V
ATRIAL PACING PM: 96
RV LEAD AMPLITUDE: 15.68 mv
RV LEAD THRESHOLD: 0.5 V

## 2011-06-03 NOTE — Telephone Encounter (Signed)
Pt saw Dr Johney Frame today for pacer check.  She has fallen and has hematoma on leg.  She has been off coumadin 5 days and Dr Johney Frame told her to hold coumadin another week.  Cancelled INR appt for 9/7 and rescheduled for 9/18.  Pt will restart coumadin on 9/12.

## 2011-06-03 NOTE — Assessment & Plan Note (Signed)
Maintaining sinus rhythm with amiodarone Coumadin is on hold due to her recent fall. I would recommend holding coumadin for another week and then restarting it once her hematoma on her L leg is improved.  She will continue to follow in our coumadin clinic.

## 2011-06-03 NOTE — Assessment & Plan Note (Addendum)
Normal pacemaker function See Arita Miss Art report No changes today  Her Kappa battery is approaching ERI. TTMs every 3 months Return to device clinic inh 6 months She may need more frequent follow-up at that time.

## 2011-06-03 NOTE — Progress Notes (Signed)
The patient presents today for routine electrophysiology followup.  Since last being seen in our clinic, the patient reports doing reasonably well.  She had a mechanical fall on Sunday.  She broke her L arm and has significant hematomas on both shins.  She appears to be healing slowly but surely.  Today, she denies symptoms of palpitations, chest pain, shortness of breath, orthopnea, PND, lower extremity edema, dizziness, presyncope, syncope, or neurologic sequela.  The patient feels that she is tolerating medications without difficulties and is otherwise without complaint today.   Past Medical History  Diagnosis Date  . Vertigo   . Paroxysmal atrial fibrillation     on coumadin and amiodarone  . Sinus node dysfunction     s/p Medtronic dual-chamber pacemaker  . Hypercalcemia   . Hypertension   . Dyslipidemia   . CAD (coronary artery disease)     Two vessel, quiescent; 70% LAD aretery disease and well collateralized, 100% RCA by cardiac cath in 2004  . Hypothyroidism    Past Surgical History  Procedure Date  . Pacemaker insertion     Current Outpatient Prescriptions  Medication Sig Dispense Refill  . amiodarone (PACERONE) 200 MG tablet Take 0.5 tablets (100 mg total) by mouth daily.  15 tablet  3  . amLODipine (NORVASC) 10 MG tablet Take 0.5 tablets (5 mg total) by mouth daily.  15 tablet  3  . aspirin 81 MG tablet Take 81 mg by mouth daily.        . cholecalciferol (VITAMIN D) 1000 UNITS tablet Take 1,000 Units by mouth daily.        . clonazePAM (KLONOPIN) 0.5 MG tablet Take 0.5 mg by mouth at bedtime as needed.       . ezetimibe (ZETIA) 10 MG tablet Take 10 mg by mouth daily.        . fish oil-omega-3 fatty acids 1000 MG capsule Take 1 g by mouth 2 (two) times daily.       . isosorbide mononitrate (IMDUR) 30 MG CR tablet Take 1/2 by mouth daily  15 tablet  6  . levothyroxine (SYNTHROID, LEVOTHROID) 75 MCG tablet Take 75 mcg by mouth daily.        . metoprolol tartrate (LOPRESSOR)  25 MG tablet Take 25 mg by mouth 2 (two) times daily.        . pantoprazole (PROTONIX) 40 MG tablet Take 40 mg by mouth daily.        . raloxifene (EVISTA) 60 MG tablet Take 60 mg by mouth daily.        . rosuvastatin (CRESTOR) 5 MG tablet Take 5 mg by mouth Nightly.        . traMADol (ULTRAM) 50 MG tablet Take 50-100 mg by mouth every 4 (four) hours as needed.        . vitamin B-12 (CYANOCOBALAMIN) 250 MCG tablet Take 250 mcg by mouth daily.        Marland Kitchen warfarin (COUMADIN) 2.5 MG tablet Take 1 tablet (2.5 mg total) by mouth as directed.  30 tablet  2    No Known Allergies  History   Social History  . Marital Status: Single    Spouse Name: N/A    Number of Children: N/A  . Years of Education: N/A   Occupational History  . Not on file.   Social History Main Topics  . Smoking status: Never Smoker   . Smokeless tobacco: Never Used  . Alcohol Use: No  . Drug Use: No  .  Sexually Active: Not on file   Other Topics Concern  . Not on file   Social History Narrative  . No narrative on file    Family History  Problem Relation Age of Onset  . Hypertension Neg Hx   . Diabetes Neg Hx   . Coronary artery disease Neg Hx     Physical Exam: Filed Vitals:   06/03/11 1000  BP: 123/67  Pulse: 75  Height: 5\' 3"  (1.6 m)  Weight: 161 lb (73.029 kg)    GEN- The patient is elderly appearing, alert and oriented x 3 today.   Head- normocephalic, atraumatic Eyes-  Sclera clear, conjunctiva pink Ears- hearing intact Oropharynx- clear Neck- supple, no JVP Lymph- no cervical lymphadenopathy Lungs- Clear to ausculation bilaterally, normal work of breathing Chest- pacemaker pocket is well healed Heart- Regular rate and rhythm, no murmurs, rubs or gallops, PMI not laterally displaced GI- soft, NT, ND, + BS Extremities- no clubbing, cyanosis, or edema MS- L arm is in a cast Skin- very large hematoma over the L pretibial region with diffuse ecchymosis, small hematoma over R pretibial  region Psych- euthymic mood, full affect Neuro- strength and sensation are intact  Pacemaker interrogation- reviewed in detail today,  See PACEART report  Assessment and Plan:

## 2011-06-04 ENCOUNTER — Encounter: Payer: Medicare Other | Admitting: *Deleted

## 2011-06-15 ENCOUNTER — Encounter: Payer: Self-pay | Admitting: Internal Medicine

## 2011-06-15 ENCOUNTER — Encounter: Payer: Medicare Other | Admitting: *Deleted

## 2011-06-15 ENCOUNTER — Ambulatory Visit (INDEPENDENT_AMBULATORY_CARE_PROVIDER_SITE_OTHER): Payer: Medicare Other | Admitting: *Deleted

## 2011-06-15 DIAGNOSIS — Z7901 Long term (current) use of anticoagulants: Secondary | ICD-10-CM

## 2011-06-15 DIAGNOSIS — I4891 Unspecified atrial fibrillation: Secondary | ICD-10-CM

## 2011-06-15 LAB — POCT INR: INR: 1.2

## 2011-06-15 NOTE — Progress Notes (Signed)
I can not close 

## 2011-06-15 NOTE — Patient Instructions (Signed)
Pt has appt with Dr Evette Georges 9/21.  He will look at leg then.  Pt to call me on 06/21/11 to let me know how leg looks and when she should go back on coumadin.

## 2011-06-21 ENCOUNTER — Telehealth: Payer: Self-pay | Admitting: *Deleted

## 2011-06-21 NOTE — Telephone Encounter (Signed)
Pt states hematoma Lt leg is a little better that it was last Tuesday.  Swelling has gone down small amt.  Will leave pt off coumadin until Thursday 9/27.  She will restart coumadin then at 1 tablet daily until INR check on 07/02/11.  Pt verbalized understanding.

## 2011-07-02 ENCOUNTER — Ambulatory Visit (INDEPENDENT_AMBULATORY_CARE_PROVIDER_SITE_OTHER): Payer: Medicare Other | Admitting: *Deleted

## 2011-07-02 ENCOUNTER — Encounter: Payer: Self-pay | Admitting: Internal Medicine

## 2011-07-02 DIAGNOSIS — I4891 Unspecified atrial fibrillation: Secondary | ICD-10-CM

## 2011-07-02 DIAGNOSIS — Z7901 Long term (current) use of anticoagulants: Secondary | ICD-10-CM

## 2011-07-16 ENCOUNTER — Ambulatory Visit (INDEPENDENT_AMBULATORY_CARE_PROVIDER_SITE_OTHER): Payer: Medicare Other | Admitting: *Deleted

## 2011-07-16 DIAGNOSIS — Z7901 Long term (current) use of anticoagulants: Secondary | ICD-10-CM

## 2011-07-16 DIAGNOSIS — I4891 Unspecified atrial fibrillation: Secondary | ICD-10-CM

## 2011-07-16 LAB — POCT INR: INR: 2.1

## 2011-08-06 ENCOUNTER — Ambulatory Visit (INDEPENDENT_AMBULATORY_CARE_PROVIDER_SITE_OTHER): Payer: Medicare Other | Admitting: *Deleted

## 2011-08-06 DIAGNOSIS — Z7901 Long term (current) use of anticoagulants: Secondary | ICD-10-CM

## 2011-08-06 DIAGNOSIS — I4891 Unspecified atrial fibrillation: Secondary | ICD-10-CM

## 2011-08-06 LAB — POCT INR: INR: 2.3

## 2011-08-11 ENCOUNTER — Other Ambulatory Visit: Payer: Self-pay | Admitting: Cardiology

## 2011-08-25 ENCOUNTER — Encounter: Payer: Self-pay | Admitting: Cardiology

## 2011-08-25 ENCOUNTER — Ambulatory Visit (INDEPENDENT_AMBULATORY_CARE_PROVIDER_SITE_OTHER): Payer: Medicare Other | Admitting: Physician Assistant

## 2011-08-25 DIAGNOSIS — I2510 Atherosclerotic heart disease of native coronary artery without angina pectoris: Secondary | ICD-10-CM

## 2011-08-25 DIAGNOSIS — I4891 Unspecified atrial fibrillation: Secondary | ICD-10-CM

## 2011-08-25 DIAGNOSIS — I10 Essential (primary) hypertension: Secondary | ICD-10-CM

## 2011-08-25 DIAGNOSIS — D649 Anemia, unspecified: Secondary | ICD-10-CM

## 2011-08-25 DIAGNOSIS — Z950 Presence of cardiac pacemaker: Secondary | ICD-10-CM

## 2011-08-25 DIAGNOSIS — E785 Hyperlipidemia, unspecified: Secondary | ICD-10-CM

## 2011-08-25 NOTE — Assessment & Plan Note (Signed)
Followup by Dr. Sherril Croon. Will discontinue low-dose aspirin, given that patient is on chronic Coumadin anticoagulation.

## 2011-08-25 NOTE — Assessment & Plan Note (Signed)
Very well-controlled on current medication regimen; LDL 53, 4/12.

## 2011-08-25 NOTE — Assessment & Plan Note (Signed)
Very well controlled on current medication regimen 

## 2011-08-25 NOTE — Progress Notes (Signed)
HPI: Patient presents for scheduled followup.  Since recent OV with Dr. Johney Frame, 9/12, patient denies any recurrent mechanical falls. She is currently being closely followed by Dr. Sherril Croon for anemia. She denies any evidence of overt GI/GU bleeding. She was advised to undergo colonoscopy, but declined. She is on chronic Coumadin, followed here in our [place] clinic. She is also on concomitant low-dose aspirin, given history of CAD.  She denies interim development of CP, DOE, or tachycardia palpitations. She denies symptoms suggestive of CHF. She does complain of generalized weakness. She states that her LLE hematoma, which resulted from her recent fall, has greatly diminished, but has not yet completely resolved.  PMH: reviewed and listed in Problem List in electronic Records (and see below)  Allergies/SH/FH: available in Electronic Records for review  Current Outpatient Prescriptions  Medication Sig Dispense Refill  . amiodarone (PACERONE) 200 MG tablet Take 0.5 tablets (100 mg total) by mouth daily.  15 tablet  3  . amLODipine (NORVASC) 10 MG tablet Take 0.5 tablets (5 mg total) by mouth daily.  15 tablet  3  . aspirin 81 MG tablet Take 81 mg by mouth daily.        . cholecalciferol (VITAMIN D) 1000 UNITS tablet Take 1,000 Units by mouth daily.        . clonazePAM (KLONOPIN) 0.5 MG tablet Take 0.5 mg by mouth at bedtime as needed.       . ezetimibe (ZETIA) 10 MG tablet Take 10 mg by mouth daily.        . fish oil-omega-3 fatty acids 1000 MG capsule Take 1 g by mouth 2 (two) times daily.       . isosorbide mononitrate (IMDUR) 30 MG CR tablet Take 1/2 by mouth daily  15 tablet  6  . levothyroxine (SYNTHROID, LEVOTHROID) 75 MCG tablet Take 75 mcg by mouth daily.        . metoprolol tartrate (LOPRESSOR) 25 MG tablet Take 25 mg by mouth 2 (two) times daily.        . pantoprazole (PROTONIX) 40 MG tablet Take 40 mg by mouth daily.        . raloxifene (EVISTA) 60 MG tablet Take 60 mg by mouth daily.          . rosuvastatin (CRESTOR) 5 MG tablet Take 5 mg by mouth Nightly.        . vitamin B-12 (CYANOCOBALAMIN) 250 MCG tablet Take 250 mcg by mouth daily.        Marland Kitchen warfarin (COUMADIN) 2.5 MG tablet TAKE 1 TABLET (2.5 MG TOTAL) BY MOUTH AS DIRECTED.  30 tablet  3    ROS: no nausea, vomiting; no fever, chills; no melena, hematochezia; no claudication; no recent falls/syncope.  PHYSICAL EXAM:  BP 122/73  Pulse 76  Ht 5\' 3"  (1.6 m)  Wt 155 lb (70.308 kg)  BMI 27.46 kg/m2 GENERAL: well-nourished, well-developed; NAD HEENT: NCAT, PERRLA, EOMI; sclera clear; no xanthelasma NECK: palpable bilateral carotid pulses, no bruits; no JVD; no TM LUNGS: CTA bilaterally CARDIAC: RRR (S1, S2); no significant murmurs; no rubs or gallops ABDOMEN: soft, non-tender; intact BS EXTREMETIES: Moderate sized hematoma overlying left shin, minimal residual right sided hematoma; no significant peripheral edema SKIN: warm/dry; no obvious rash/lesions MUSCULOSKELETAL: no joint deformity NEURO: no focal deficit; NL affect  EKG: reviewed and available in Electronic Records   ASSESSMENT & PLAN:

## 2011-08-25 NOTE — Assessment & Plan Note (Signed)
Quiescent on current medication regimen. Low risk Lexiscan Cardiolite; EF 74%, 11/10. No further workup currently indicated.

## 2011-08-25 NOTE — Patient Instructions (Signed)
   Stop Aspirin Continue all other current medications. Your physician wants you to follow up in:  1 year.  You will receive a reminder letter in the mail one-two months in advance.  If you don't receive a letter, please call our office to schedule the follow up appointment

## 2011-08-25 NOTE — Assessment & Plan Note (Signed)
Maintaining NSR on low-dose amiodarone; on chronic Coumadin, followed in our [place] clinic.

## 2011-08-25 NOTE — Assessment & Plan Note (Signed)
Followup with Dr. Hillis Range, as scheduled. Recent scheduled OV, 9/12, notable for normal pacer function, and indication that battery was approaching ERI. Scheduled return in 6 months.

## 2011-09-03 ENCOUNTER — Ambulatory Visit (INDEPENDENT_AMBULATORY_CARE_PROVIDER_SITE_OTHER): Payer: Medicare Other | Admitting: *Deleted

## 2011-09-03 DIAGNOSIS — I4891 Unspecified atrial fibrillation: Secondary | ICD-10-CM

## 2011-09-03 DIAGNOSIS — Z7901 Long term (current) use of anticoagulants: Secondary | ICD-10-CM

## 2011-09-30 ENCOUNTER — Other Ambulatory Visit: Payer: Self-pay | Admitting: Cardiology

## 2011-09-30 DIAGNOSIS — D649 Anemia, unspecified: Secondary | ICD-10-CM | POA: Diagnosis not present

## 2011-09-30 DIAGNOSIS — K625 Hemorrhage of anus and rectum: Secondary | ICD-10-CM | POA: Diagnosis not present

## 2011-10-01 ENCOUNTER — Encounter: Payer: Self-pay | Admitting: Internal Medicine

## 2011-10-01 DIAGNOSIS — I495 Sick sinus syndrome: Secondary | ICD-10-CM | POA: Diagnosis not present

## 2011-10-05 DIAGNOSIS — D500 Iron deficiency anemia secondary to blood loss (chronic): Secondary | ICD-10-CM | POA: Diagnosis not present

## 2011-10-05 DIAGNOSIS — Z7901 Long term (current) use of anticoagulants: Secondary | ICD-10-CM | POA: Diagnosis not present

## 2011-10-05 DIAGNOSIS — K625 Hemorrhage of anus and rectum: Secondary | ICD-10-CM | POA: Diagnosis not present

## 2011-10-05 DIAGNOSIS — K449 Diaphragmatic hernia without obstruction or gangrene: Secondary | ICD-10-CM | POA: Diagnosis not present

## 2011-10-05 DIAGNOSIS — D509 Iron deficiency anemia, unspecified: Secondary | ICD-10-CM | POA: Diagnosis not present

## 2011-10-05 DIAGNOSIS — Z79899 Other long term (current) drug therapy: Secondary | ICD-10-CM | POA: Diagnosis not present

## 2011-10-08 ENCOUNTER — Ambulatory Visit (INDEPENDENT_AMBULATORY_CARE_PROVIDER_SITE_OTHER): Payer: Medicare Other | Admitting: *Deleted

## 2011-10-08 DIAGNOSIS — Z7901 Long term (current) use of anticoagulants: Secondary | ICD-10-CM

## 2011-10-08 DIAGNOSIS — I4891 Unspecified atrial fibrillation: Secondary | ICD-10-CM | POA: Diagnosis not present

## 2011-10-08 LAB — POCT INR: INR: 1.4

## 2011-10-22 ENCOUNTER — Encounter: Payer: Medicare Other | Admitting: *Deleted

## 2011-10-28 DIAGNOSIS — I10 Essential (primary) hypertension: Secondary | ICD-10-CM | POA: Diagnosis not present

## 2011-10-28 DIAGNOSIS — S8010XA Contusion of unspecified lower leg, initial encounter: Secondary | ICD-10-CM | POA: Diagnosis not present

## 2011-10-29 ENCOUNTER — Ambulatory Visit (INDEPENDENT_AMBULATORY_CARE_PROVIDER_SITE_OTHER): Payer: Medicare Other | Admitting: *Deleted

## 2011-10-29 DIAGNOSIS — Z7901 Long term (current) use of anticoagulants: Secondary | ICD-10-CM | POA: Diagnosis not present

## 2011-10-29 DIAGNOSIS — I4891 Unspecified atrial fibrillation: Secondary | ICD-10-CM

## 2011-10-29 DIAGNOSIS — R5381 Other malaise: Secondary | ICD-10-CM | POA: Diagnosis not present

## 2011-10-29 DIAGNOSIS — Z79899 Other long term (current) drug therapy: Secondary | ICD-10-CM | POA: Diagnosis not present

## 2011-10-29 LAB — POCT INR: INR: 2.6

## 2011-11-05 DIAGNOSIS — D649 Anemia, unspecified: Secondary | ICD-10-CM | POA: Diagnosis not present

## 2011-11-26 ENCOUNTER — Ambulatory Visit (INDEPENDENT_AMBULATORY_CARE_PROVIDER_SITE_OTHER): Payer: Medicare Other | Admitting: *Deleted

## 2011-11-26 ENCOUNTER — Other Ambulatory Visit: Payer: Self-pay | Admitting: *Deleted

## 2011-11-26 DIAGNOSIS — I4891 Unspecified atrial fibrillation: Secondary | ICD-10-CM

## 2011-11-26 DIAGNOSIS — Z7901 Long term (current) use of anticoagulants: Secondary | ICD-10-CM

## 2011-11-26 MED ORDER — AMIODARONE HCL 200 MG PO TABS: 100.0000 mg | ORAL_TABLET | Freq: Every day | ORAL | Status: DC

## 2011-12-03 ENCOUNTER — Telehealth: Payer: Self-pay | Admitting: Internal Medicine

## 2011-12-03 NOTE — Telephone Encounter (Signed)
12-03-11 lmm for pt to call to set up pacer ck with allred due in march in [place]/mt

## 2011-12-28 ENCOUNTER — Ambulatory Visit (INDEPENDENT_AMBULATORY_CARE_PROVIDER_SITE_OTHER): Payer: Medicare Other | Admitting: *Deleted

## 2011-12-28 DIAGNOSIS — I4891 Unspecified atrial fibrillation: Secondary | ICD-10-CM

## 2011-12-28 DIAGNOSIS — Z7901 Long term (current) use of anticoagulants: Secondary | ICD-10-CM | POA: Diagnosis not present

## 2011-12-28 LAB — POCT INR: INR: 2.1

## 2011-12-29 ENCOUNTER — Telehealth: Payer: Self-pay | Admitting: *Deleted

## 2011-12-29 NOTE — Telephone Encounter (Signed)
Patient here yesterday for coumadin check.  Also, filled out walk-in form stating she wanted to check about high blood pressure.    Discussed above with patient.  On Monday, her BP was 162/67, 165/69, & 155/68.  After further questioning, all readings were from same day and back to back.  No c/o chest pain or SOB.  Rare palpitations.  Has felt a little dizzy at times.  Patient very anxious about numbers.  Advised her that her blood pressure readings are not emergent and that she is not showing me that she is any acute distress currently.  Also, informed her that taking her BP repeatedly in the same arm can cause inaccurate results.  Advised her that I would notify MD & see what he advises.  She verbalized understanding.

## 2011-12-31 ENCOUNTER — Encounter: Payer: Self-pay | Admitting: Internal Medicine

## 2011-12-31 DIAGNOSIS — I495 Sick sinus syndrome: Secondary | ICD-10-CM | POA: Diagnosis not present

## 2012-01-04 NOTE — Telephone Encounter (Signed)
I agree with your assessment on the other hand the patient does have risk factors like hypertension to go back in atrial fibrillation.  She is also on warfarin.  I will start her on chlorthalidone 25 mg a day and recheck her blood pressure in one week, but certainly not as frequently as she is doing right now and also didn't be met in one week.  The latter to make sure her potassium is stable while she is taking amiodarone

## 2012-01-05 ENCOUNTER — Encounter: Payer: Self-pay | Admitting: Internal Medicine

## 2012-01-05 ENCOUNTER — Ambulatory Visit (INDEPENDENT_AMBULATORY_CARE_PROVIDER_SITE_OTHER): Payer: Medicare Other | Admitting: Internal Medicine

## 2012-01-05 VITALS — BP 150/72 | HR 70 | Ht 63.0 in | Wt 154.0 lb

## 2012-01-05 DIAGNOSIS — I495 Sick sinus syndrome: Secondary | ICD-10-CM

## 2012-01-05 DIAGNOSIS — I10 Essential (primary) hypertension: Secondary | ICD-10-CM | POA: Diagnosis not present

## 2012-01-05 DIAGNOSIS — I4891 Unspecified atrial fibrillation: Secondary | ICD-10-CM

## 2012-01-05 DIAGNOSIS — Z950 Presence of cardiac pacemaker: Secondary | ICD-10-CM

## 2012-01-05 LAB — PACEMAKER DEVICE OBSERVATION
AL IMPEDENCE PM: 506 Ohm
AL THRESHOLD: 0.75 V
ATRIAL PACING PM: 92
BATTERY VOLTAGE: 2.69 V
RV LEAD AMPLITUDE: 15.68 mv
RV LEAD IMPEDENCE PM: 862 Ohm

## 2012-01-05 MED ORDER — AMLODIPINE BESYLATE 10 MG PO TABS: 10.0000 mg | ORAL_TABLET | Freq: Every day | ORAL | Status: DC

## 2012-01-05 NOTE — Assessment & Plan Note (Signed)
Above goal today She is reluctant to try new medicines I will therefore increase norvasc to 10mg  daily today  Salt restriction encouraged If BP remains elevated, then we should add a diuretic in the future.

## 2012-01-05 NOTE — Progress Notes (Signed)
Primary Cardiologist:  Dr Andee Lineman  The patient presents today for routine electrophysiology followup.  Since last being seen in our clinic, the patient reports doing reasonably well. She has had no further falls.  Her primary concern is with elevation BP.  She reports BP at home 150s/70s. Today, she denies symptoms of palpitations, chest pain, shortness of breath, orthopnea, PND, lower extremity edema, dizziness, presyncope, syncope, or neurologic sequela.  The patient feels that she is tolerating medications without difficulties and is otherwise without complaint today.   Past Medical History  Diagnosis Date  . Vertigo   . Paroxysmal atrial fibrillation     on coumadin and amiodarone  . Sinus node dysfunction     s/p Medtronic dual-chamber pacemaker  . Hypercalcemia   . Hypertension   . Dyslipidemia   . CAD (coronary artery disease)     Two vessel, quiescent; 70% LAD aretery disease and well collateralized, 100% RCA by cardiac cath in 2004; NL LVF; low risk Cardioloite; EF 74%, 11/10  . Hypothyroidism    Past Surgical History  Procedure Date  . Pacemaker insertion 05/24/03    MDT [place]    Current Outpatient Prescriptions  Medication Sig Dispense Refill  . amiodarone (PACERONE) 200 MG tablet Take 0.5 tablets (100 mg total) by mouth daily.  15 tablet  6  . amLODipine (NORVASC) 10 MG tablet Take 1 tablet (10 mg total) by mouth daily.  30 tablet  11  . cholecalciferol (VITAMIN D) 1000 UNITS tablet Take 1,000 Units by mouth daily.        . clonazePAM (KLONOPIN) 0.5 MG tablet Take 0.5 mg by mouth at bedtime as needed.       . ezetimibe (ZETIA) 10 MG tablet Take 10 mg by mouth daily.        . ferrous sulfate 325 (65 FE) MG tablet Take 325 mg by mouth daily with breakfast.      . fish oil-omega-3 fatty acids 1000 MG capsule Take 1 g by mouth 2 (two) times daily.       . isosorbide mononitrate (IMDUR) 30 MG CR tablet Take 1/2 by mouth daily  15 tablet  6  . levothyroxine (SYNTHROID,  LEVOTHROID) 75 MCG tablet Take 75 mcg by mouth daily.        . metoprolol tartrate (LOPRESSOR) 25 MG tablet Take 25 mg by mouth 2 (two) times daily.        . pantoprazole (PROTONIX) 40 MG tablet Take 40 mg by mouth daily.        . raloxifene (EVISTA) 60 MG tablet Take 60 mg by mouth daily.        . rosuvastatin (CRESTOR) 5 MG tablet Take 5 mg by mouth Nightly.        . warfarin (COUMADIN) 2.5 MG tablet TAKE 1 TABLET (2.5 MG TOTAL) BY MOUTH AS DIRECTED.  30 tablet  3  . DISCONTD: amLODipine (NORVASC) 10 MG tablet Take 0.5 tablets (5 mg total) by mouth daily.  15 tablet  11  . DISCONTD: amLODipine (NORVASC) 10 MG tablet Take 1 tablet (10 mg total) by mouth daily.  30 tablet  11    No Known Allergies  History   Social History  . Marital Status: Single    Spouse Name: N/A    Number of Children: N/A  . Years of Education: N/A   Occupational History  . Not on file.   Social History Main Topics  . Smoking status: Never Smoker   . Smokeless tobacco:  Never Used  . Alcohol Use: No  . Drug Use: No  . Sexually Active: Not on file   Other Topics Concern  . Not on file   Social History Narrative  . No narrative on file    Family History  Problem Relation Age of Onset  . Hypertension Neg Hx   . Diabetes Neg Hx   . Coronary artery disease Neg Hx     Physical Exam: Filed Vitals:   01/05/12 0954 01/05/12 1107 01/05/12 1108  BP: 126/74 148/81 150/72  Pulse: 78 71 70  Height: 5\' 3"  (1.6 m)    Weight: 154 lb (69.854 kg)    SpO2: 97%      GEN- The patient is elderly appearing, alert and oriented x 3 today.   Head- normocephalic, atraumatic Eyes-  Sclera clear, conjunctiva pink Ears- hearing intact Oropharynx- clear Neck- supple, no JVP Lymph- no cervical lymphadenopathy Lungs- Clear to ausculation bilaterally, normal work of breathing Chest- pacemaker pocket is well healed Heart- Regular rate and rhythm, no murmurs, rubs or gallops, PMI not laterally displaced GI- soft, NT,  ND, + BS Extremities- no clubbing, cyanosis, or edema   Pacemaker interrogation- reviewed in detail today,  See PACEART report  Assessment and Plan:

## 2012-01-05 NOTE — Telephone Encounter (Signed)
Left message to return call 

## 2012-01-05 NOTE — Assessment & Plan Note (Signed)
Normal pacemaker function See Arita Miss Art report No changes today  Her device is approaching ERI.  She will need TTM in 3 months, then 6 month follow-up with device clinic.  I suspect that she may require more frequent TTMS at that point depending on estimated longevity.

## 2012-01-05 NOTE — Patient Instructions (Addendum)
   Follow up in 6 months with Belenda Cruise and 1 year with Dr. Johney Frame.  Increase Norvasc (amlodipine) to 10 mg (full tablet) daily.  Keep record of your Blood Pressure. Your physician recommends that you continue on your current medications as directed. Please refer to the Current Medication list given to you today.

## 2012-01-05 NOTE — Assessment & Plan Note (Signed)
Controlled Continue coumadin  She will ask Dr Sherril Croon to follow LFTs/TFTs twice per year

## 2012-01-05 NOTE — Telephone Encounter (Signed)
Patient in office today to see Dr. Johney Frame.  He increased her Norvasc to 10mg  daily.  Will follow up if needed before yearly check up time.

## 2012-01-17 DIAGNOSIS — Z0000 Encounter for general adult medical examination without abnormal findings: Secondary | ICD-10-CM | POA: Diagnosis not present

## 2012-01-18 DIAGNOSIS — R5381 Other malaise: Secondary | ICD-10-CM | POA: Diagnosis not present

## 2012-01-18 DIAGNOSIS — E7800 Pure hypercholesterolemia, unspecified: Secondary | ICD-10-CM | POA: Diagnosis not present

## 2012-01-18 DIAGNOSIS — Z79899 Other long term (current) drug therapy: Secondary | ICD-10-CM | POA: Diagnosis not present

## 2012-01-22 DIAGNOSIS — R079 Chest pain, unspecified: Secondary | ICD-10-CM | POA: Diagnosis not present

## 2012-01-25 ENCOUNTER — Ambulatory Visit (INDEPENDENT_AMBULATORY_CARE_PROVIDER_SITE_OTHER): Payer: Medicare Other | Admitting: *Deleted

## 2012-01-25 DIAGNOSIS — I4891 Unspecified atrial fibrillation: Secondary | ICD-10-CM | POA: Diagnosis not present

## 2012-01-25 DIAGNOSIS — Z7901 Long term (current) use of anticoagulants: Secondary | ICD-10-CM | POA: Diagnosis not present

## 2012-02-05 ENCOUNTER — Other Ambulatory Visit: Payer: Self-pay | Admitting: Cardiology

## 2012-02-16 DIAGNOSIS — E7800 Pure hypercholesterolemia, unspecified: Secondary | ICD-10-CM | POA: Diagnosis not present

## 2012-02-16 DIAGNOSIS — R21 Rash and other nonspecific skin eruption: Secondary | ICD-10-CM | POA: Diagnosis not present

## 2012-02-22 ENCOUNTER — Ambulatory Visit (INDEPENDENT_AMBULATORY_CARE_PROVIDER_SITE_OTHER): Payer: Medicare Other | Admitting: *Deleted

## 2012-02-22 DIAGNOSIS — Z7901 Long term (current) use of anticoagulants: Secondary | ICD-10-CM

## 2012-02-22 DIAGNOSIS — I4891 Unspecified atrial fibrillation: Secondary | ICD-10-CM | POA: Diagnosis not present

## 2012-02-22 LAB — POCT INR: INR: 2.1

## 2012-03-21 ENCOUNTER — Ambulatory Visit (INDEPENDENT_AMBULATORY_CARE_PROVIDER_SITE_OTHER): Payer: Medicare Other | Admitting: *Deleted

## 2012-03-21 DIAGNOSIS — I4891 Unspecified atrial fibrillation: Secondary | ICD-10-CM

## 2012-03-21 DIAGNOSIS — Z7901 Long term (current) use of anticoagulants: Secondary | ICD-10-CM | POA: Diagnosis not present

## 2012-03-21 LAB — POCT INR: INR: 2.6

## 2012-03-23 ENCOUNTER — Encounter: Payer: Medicare Other | Admitting: Internal Medicine

## 2012-03-28 DIAGNOSIS — I4891 Unspecified atrial fibrillation: Secondary | ICD-10-CM | POA: Diagnosis not present

## 2012-03-28 DIAGNOSIS — M1990 Unspecified osteoarthritis, unspecified site: Secondary | ICD-10-CM | POA: Diagnosis not present

## 2012-03-31 DIAGNOSIS — I495 Sick sinus syndrome: Secondary | ICD-10-CM

## 2012-04-17 DIAGNOSIS — Z79899 Other long term (current) drug therapy: Secondary | ICD-10-CM | POA: Diagnosis not present

## 2012-04-17 DIAGNOSIS — E039 Hypothyroidism, unspecified: Secondary | ICD-10-CM | POA: Diagnosis not present

## 2012-04-17 DIAGNOSIS — E7800 Pure hypercholesterolemia, unspecified: Secondary | ICD-10-CM | POA: Diagnosis not present

## 2012-05-01 DIAGNOSIS — N390 Urinary tract infection, site not specified: Secondary | ICD-10-CM | POA: Diagnosis not present

## 2012-05-02 ENCOUNTER — Ambulatory Visit (INDEPENDENT_AMBULATORY_CARE_PROVIDER_SITE_OTHER): Payer: Medicare Other | Admitting: *Deleted

## 2012-05-02 DIAGNOSIS — Z7901 Long term (current) use of anticoagulants: Secondary | ICD-10-CM | POA: Diagnosis not present

## 2012-05-02 DIAGNOSIS — I4891 Unspecified atrial fibrillation: Secondary | ICD-10-CM

## 2012-05-02 LAB — POCT INR: INR: 2.9

## 2012-05-16 ENCOUNTER — Ambulatory Visit (INDEPENDENT_AMBULATORY_CARE_PROVIDER_SITE_OTHER): Payer: Medicare Other | Admitting: *Deleted

## 2012-05-16 DIAGNOSIS — I4891 Unspecified atrial fibrillation: Secondary | ICD-10-CM | POA: Diagnosis not present

## 2012-05-16 DIAGNOSIS — Z7901 Long term (current) use of anticoagulants: Secondary | ICD-10-CM | POA: Diagnosis not present

## 2012-05-19 DIAGNOSIS — J069 Acute upper respiratory infection, unspecified: Secondary | ICD-10-CM | POA: Diagnosis not present

## 2012-05-24 DIAGNOSIS — J069 Acute upper respiratory infection, unspecified: Secondary | ICD-10-CM | POA: Diagnosis not present

## 2012-06-02 DIAGNOSIS — Z950 Presence of cardiac pacemaker: Secondary | ICD-10-CM | POA: Diagnosis not present

## 2012-06-02 DIAGNOSIS — J42 Unspecified chronic bronchitis: Secondary | ICD-10-CM | POA: Diagnosis not present

## 2012-06-02 DIAGNOSIS — I517 Cardiomegaly: Secondary | ICD-10-CM | POA: Diagnosis not present

## 2012-06-02 DIAGNOSIS — J40 Bronchitis, not specified as acute or chronic: Secondary | ICD-10-CM | POA: Diagnosis not present

## 2012-06-02 DIAGNOSIS — R05 Cough: Secondary | ICD-10-CM | POA: Diagnosis not present

## 2012-06-06 ENCOUNTER — Ambulatory Visit (INDEPENDENT_AMBULATORY_CARE_PROVIDER_SITE_OTHER): Payer: Medicare Other | Admitting: *Deleted

## 2012-06-06 DIAGNOSIS — Z7901 Long term (current) use of anticoagulants: Secondary | ICD-10-CM

## 2012-06-06 DIAGNOSIS — I4891 Unspecified atrial fibrillation: Secondary | ICD-10-CM

## 2012-06-11 ENCOUNTER — Inpatient Hospital Stay (HOSPITAL_COMMUNITY)
Admission: EM | Admit: 2012-06-11 | Discharge: 2012-06-20 | DRG: 481 | Disposition: A | Discharge status: Skilled Nursing Facility | Payer: Medicare Other | Attending: Family Medicine | Admitting: Family Medicine

## 2012-06-11 ENCOUNTER — Encounter (HOSPITAL_COMMUNITY): Payer: Self-pay | Admitting: Emergency Medicine

## 2012-06-11 ENCOUNTER — Emergency Department (HOSPITAL_COMMUNITY): Payer: Medicare Other

## 2012-06-11 DIAGNOSIS — S72109A Unspecified trochanteric fracture of unspecified femur, initial encounter for closed fracture: Secondary | ICD-10-CM

## 2012-06-11 DIAGNOSIS — J40 Bronchitis, not specified as acute or chronic: Secondary | ICD-10-CM | POA: Diagnosis not present

## 2012-06-11 DIAGNOSIS — R7309 Other abnormal glucose: Secondary | ICD-10-CM | POA: Diagnosis not present

## 2012-06-11 DIAGNOSIS — Z950 Presence of cardiac pacemaker: Secondary | ICD-10-CM

## 2012-06-11 DIAGNOSIS — I10 Essential (primary) hypertension: Secondary | ICD-10-CM | POA: Diagnosis not present

## 2012-06-11 DIAGNOSIS — M6281 Muscle weakness (generalized): Secondary | ICD-10-CM | POA: Diagnosis not present

## 2012-06-11 DIAGNOSIS — Y9229 Other specified public building as the place of occurrence of the external cause: Secondary | ICD-10-CM

## 2012-06-11 DIAGNOSIS — J189 Pneumonia, unspecified organism: Secondary | ICD-10-CM | POA: Diagnosis not present

## 2012-06-11 DIAGNOSIS — J9819 Other pulmonary collapse: Secondary | ICD-10-CM | POA: Diagnosis not present

## 2012-06-11 DIAGNOSIS — D500 Iron deficiency anemia secondary to blood loss (chronic): Secondary | ICD-10-CM | POA: Diagnosis present

## 2012-06-11 DIAGNOSIS — Z8249 Family history of ischemic heart disease and other diseases of the circulatory system: Secondary | ICD-10-CM

## 2012-06-11 DIAGNOSIS — S7290XA Unspecified fracture of unspecified femur, initial encounter for closed fracture: Secondary | ICD-10-CM | POA: Diagnosis not present

## 2012-06-11 DIAGNOSIS — D649 Anemia, unspecified: Secondary | ICD-10-CM | POA: Diagnosis not present

## 2012-06-11 DIAGNOSIS — S72101A Unspecified trochanteric fracture of right femur, initial encounter for closed fracture: Secondary | ICD-10-CM

## 2012-06-11 DIAGNOSIS — E785 Hyperlipidemia, unspecified: Secondary | ICD-10-CM

## 2012-06-11 DIAGNOSIS — Z96649 Presence of unspecified artificial hip joint: Secondary | ICD-10-CM | POA: Diagnosis not present

## 2012-06-11 DIAGNOSIS — E039 Hypothyroidism, unspecified: Secondary | ICD-10-CM | POA: Diagnosis present

## 2012-06-11 DIAGNOSIS — S7223XA Displaced subtrochanteric fracture of unspecified femur, initial encounter for closed fracture: Principal | ICD-10-CM | POA: Diagnosis present

## 2012-06-11 DIAGNOSIS — N390 Urinary tract infection, site not specified: Secondary | ICD-10-CM | POA: Diagnosis present

## 2012-06-11 DIAGNOSIS — I4891 Unspecified atrial fibrillation: Secondary | ICD-10-CM | POA: Diagnosis present

## 2012-06-11 DIAGNOSIS — D62 Acute posthemorrhagic anemia: Secondary | ICD-10-CM | POA: Diagnosis not present

## 2012-06-11 DIAGNOSIS — G4700 Insomnia, unspecified: Secondary | ICD-10-CM | POA: Diagnosis not present

## 2012-06-11 DIAGNOSIS — R262 Difficulty in walking, not elsewhere classified: Secondary | ICD-10-CM | POA: Diagnosis not present

## 2012-06-11 DIAGNOSIS — D696 Thrombocytopenia, unspecified: Secondary | ICD-10-CM | POA: Diagnosis not present

## 2012-06-11 DIAGNOSIS — R279 Unspecified lack of coordination: Secondary | ICD-10-CM | POA: Diagnosis not present

## 2012-06-11 DIAGNOSIS — Y831 Surgical operation with implant of artificial internal device as the cause of abnormal reaction of the patient, or of later complication, without mention of misadventure at the time of the procedure: Secondary | ICD-10-CM | POA: Diagnosis not present

## 2012-06-11 DIAGNOSIS — I2510 Atherosclerotic heart disease of native coronary artery without angina pectoris: Secondary | ICD-10-CM | POA: Diagnosis not present

## 2012-06-11 DIAGNOSIS — Z7901 Long term (current) use of anticoagulants: Secondary | ICD-10-CM | POA: Diagnosis not present

## 2012-06-11 DIAGNOSIS — W19XXXA Unspecified fall, initial encounter: Secondary | ICD-10-CM | POA: Diagnosis present

## 2012-06-11 DIAGNOSIS — R21 Rash and other nonspecific skin eruption: Secondary | ICD-10-CM | POA: Diagnosis not present

## 2012-06-11 DIAGNOSIS — Z043 Encounter for examination and observation following other accident: Secondary | ICD-10-CM | POA: Diagnosis not present

## 2012-06-11 DIAGNOSIS — IMO0002 Reserved for concepts with insufficient information to code with codable children: Secondary | ICD-10-CM | POA: Diagnosis not present

## 2012-06-11 DIAGNOSIS — Z5189 Encounter for other specified aftercare: Secondary | ICD-10-CM | POA: Diagnosis not present

## 2012-06-11 DIAGNOSIS — S7291XA Unspecified fracture of right femur, initial encounter for closed fracture: Secondary | ICD-10-CM | POA: Diagnosis present

## 2012-06-11 DIAGNOSIS — E782 Mixed hyperlipidemia: Secondary | ICD-10-CM | POA: Diagnosis present

## 2012-06-11 DIAGNOSIS — M25559 Pain in unspecified hip: Secondary | ICD-10-CM | POA: Diagnosis not present

## 2012-06-11 DIAGNOSIS — T148XXA Other injury of unspecified body region, initial encounter: Secondary | ICD-10-CM | POA: Diagnosis not present

## 2012-06-11 DIAGNOSIS — Z79899 Other long term (current) drug therapy: Secondary | ICD-10-CM | POA: Diagnosis not present

## 2012-06-11 DIAGNOSIS — I495 Sick sinus syndrome: Secondary | ICD-10-CM | POA: Diagnosis present

## 2012-06-11 DIAGNOSIS — M810 Age-related osteoporosis without current pathological fracture: Secondary | ICD-10-CM | POA: Diagnosis not present

## 2012-06-11 DIAGNOSIS — E871 Hypo-osmolality and hyponatremia: Secondary | ICD-10-CM | POA: Diagnosis not present

## 2012-06-11 DIAGNOSIS — S72009A Fracture of unspecified part of neck of unspecified femur, initial encounter for closed fracture: Secondary | ICD-10-CM | POA: Diagnosis not present

## 2012-06-11 DIAGNOSIS — R945 Abnormal results of liver function studies: Secondary | ICD-10-CM | POA: Diagnosis not present

## 2012-06-11 DIAGNOSIS — R0989 Other specified symptoms and signs involving the circulatory and respiratory systems: Secondary | ICD-10-CM

## 2012-06-11 DIAGNOSIS — M79609 Pain in unspecified limb: Secondary | ICD-10-CM | POA: Diagnosis not present

## 2012-06-11 DIAGNOSIS — Z471 Aftercare following joint replacement surgery: Secondary | ICD-10-CM | POA: Diagnosis not present

## 2012-06-11 LAB — PROTIME-INR: Prothrombin Time: 29.6 seconds — ABNORMAL HIGH (ref 11.6–15.2)

## 2012-06-11 LAB — BASIC METABOLIC PANEL
BUN: 26 mg/dL — ABNORMAL HIGH (ref 6–23)
Chloride: 100 mEq/L (ref 96–112)
GFR calc Af Amer: 68 mL/min — ABNORMAL LOW (ref >90)
GFR calc Af Amer: 56 mL/min — ABNORMAL LOW (ref >90)
GFR calc non Af Amer: 59 mL/min — ABNORMAL LOW (ref >90)
GFR calc non Af Amer: 49 mL/min — ABNORMAL LOW (ref >90)
Potassium: 4.1 mEq/L (ref 3.5–5.1)
Potassium: 4.5 mEq/L (ref 3.5–5.1)
Sodium: 136 mEq/L (ref 135–145)
Sodium: 133 mEq/L — ABNORMAL LOW (ref 135–145)

## 2012-06-11 LAB — CBC WITH DIFFERENTIAL/PLATELET
Basophils Absolute: 0 10*3/uL (ref 0.0–0.1)
Basophils Relative: 0 % (ref 0–1)
Eosinophils Absolute: 0 10*3/uL (ref 0.0–0.7)
Hemoglobin: 12.2 g/dL (ref 12.0–15.0)
MCH: 30 pg (ref 26.0–34.0)
MCHC: 33.6 g/dL (ref 30.0–36.0)
Neutro Abs: 12.3 10*3/uL — ABNORMAL HIGH (ref 1.7–7.7)
Neutrophils Relative %: 76 % (ref 43–77)
Platelets: 210 10*3/uL (ref 150–400)
RDW: 13.7 % (ref 11.5–15.5)

## 2012-06-11 LAB — CBC
Hemoglobin: 10.2 g/dL — ABNORMAL LOW (ref 12.0–15.0)
MCHC: 33.9 g/dL (ref 30.0–36.0)
RDW: 13.9 % (ref 11.5–15.5)
WBC: 19 10*3/uL — ABNORMAL HIGH (ref 4.0–10.5)

## 2012-06-11 MED ORDER — OMEGA-3-ACID ETHYL ESTERS 1 G PO CAPS
1.0000 g | ORAL_CAPSULE | Freq: Two times a day (BID) | ORAL | Status: DC
  Filled 2012-06-11: qty 1

## 2012-06-11 MED ORDER — PANTOPRAZOLE SODIUM 40 MG PO TBEC
40.0000 mg | DELAYED_RELEASE_TABLET | Freq: Every day | ORAL | Status: DC
  Administered 2012-06-12 – 2012-06-19 (×8): 40 mg via ORAL
  Filled 2012-06-11 (×9): qty 1

## 2012-06-11 MED ORDER — DOCUSATE SODIUM 100 MG PO CAPS
100.0000 mg | ORAL_CAPSULE | Freq: Two times a day (BID) | ORAL | Status: DC
  Administered 2012-06-11 – 2012-06-20 (×18): 100 mg via ORAL
  Filled 2012-06-11 (×20): qty 1

## 2012-06-11 MED ORDER — METOPROLOL TARTRATE 25 MG PO TABS
25.0000 mg | ORAL_TABLET | Freq: Two times a day (BID) | ORAL | Status: DC
  Administered 2012-06-11 – 2012-06-16 (×11): 25 mg via ORAL
  Filled 2012-06-11 (×14): qty 1

## 2012-06-11 MED ORDER — VITAMIN D3 25 MCG (1000 UNIT) PO TABS
1000.0000 [IU] | ORAL_TABLET | Freq: Every day | ORAL | Status: DC
  Administered 2012-06-12 – 2012-06-20 (×9): 1000 [IU] via ORAL
  Filled 2012-06-11 (×9): qty 1

## 2012-06-11 MED ORDER — ACETAMINOPHEN 325 MG PO TABS
650.0000 mg | ORAL_TABLET | Freq: Four times a day (QID) | ORAL | Status: DC | PRN
  Administered 2012-06-14 – 2012-06-15 (×2): 650 mg via ORAL
  Filled 2012-06-11 (×2): qty 2

## 2012-06-11 MED ORDER — DEXTROSE-NACL 5-0.45 % IV SOLN
INTRAVENOUS | Status: DC
  Administered 2012-06-11 – 2012-06-13 (×5): via INTRAVENOUS

## 2012-06-11 MED ORDER — AMLODIPINE BESYLATE 10 MG PO TABS
10.0000 mg | ORAL_TABLET | Freq: Every day | ORAL | Status: DC
  Administered 2012-06-12 – 2012-06-16 (×5): 10 mg via ORAL
  Filled 2012-06-11 (×7): qty 1

## 2012-06-11 MED ORDER — FENTANYL CITRATE 0.05 MG/ML IJ SOLN
50.0000 ug | Freq: Once | INTRAMUSCULAR | Status: AC
  Administered 2012-06-11: 50 ug via INTRAVENOUS
  Filled 2012-06-11: qty 2

## 2012-06-11 MED ORDER — CYCLOBENZAPRINE HCL 5 MG PO TABS
5.0000 mg | ORAL_TABLET | Freq: Three times a day (TID) | ORAL | Status: DC
  Administered 2012-06-11 – 2012-06-15 (×12): 5 mg via ORAL
  Filled 2012-06-11 (×17): qty 1

## 2012-06-11 MED ORDER — AMIODARONE HCL 100 MG PO TABS
100.0000 mg | ORAL_TABLET | Freq: Every day | ORAL | Status: DC
  Administered 2012-06-12 – 2012-06-20 (×9): 100 mg via ORAL
  Filled 2012-06-11 (×9): qty 1

## 2012-06-11 MED ORDER — ISOSORBIDE MONONITRATE ER 30 MG PO TB24
30.0000 mg | ORAL_TABLET | Freq: Every day | ORAL | Status: DC
  Administered 2012-06-11 – 2012-06-20 (×10): 30 mg via ORAL
  Filled 2012-06-11 (×10): qty 1

## 2012-06-11 MED ORDER — EZETIMIBE 10 MG PO TABS
10.0000 mg | ORAL_TABLET | Freq: Every day | ORAL | Status: DC
  Administered 2012-06-12 – 2012-06-15 (×4): 10 mg via ORAL
  Filled 2012-06-11 (×4): qty 1

## 2012-06-11 MED ORDER — ONDANSETRON HCL 4 MG/2ML IJ SOLN: 4.0000 mg | Freq: Three times a day (TID) | INTRAMUSCULAR | Status: AC | PRN

## 2012-06-11 MED ORDER — LEVOTHYROXINE SODIUM 75 MCG PO TABS
75.0000 ug | ORAL_TABLET | Freq: Every day | ORAL | Status: DC
  Administered 2012-06-12 – 2012-06-20 (×9): 75 ug via ORAL
  Filled 2012-06-11 (×11): qty 1

## 2012-06-11 MED ORDER — ONDANSETRON HCL 4 MG/2ML IJ SOLN
4.0000 mg | Freq: Once | INTRAMUSCULAR | Status: AC
  Administered 2012-06-11: 4 mg via INTRAVENOUS
  Filled 2012-06-11: qty 2

## 2012-06-11 MED ORDER — MORPHINE SULFATE 2 MG/ML IJ SOLN
2.0000 mg | Freq: Once | INTRAMUSCULAR | Status: AC
  Administered 2012-06-11: 2 mg via INTRAVENOUS
  Filled 2012-06-11: qty 1

## 2012-06-11 MED ORDER — FERROUS SULFATE 325 (65 FE) MG PO TABS
325.0000 mg | ORAL_TABLET | Freq: Every day | ORAL | Status: DC
  Administered 2012-06-12 – 2012-06-20 (×9): 325 mg via ORAL
  Filled 2012-06-11 (×11): qty 1

## 2012-06-11 MED ORDER — SODIUM CHLORIDE 0.9 % IJ SOLN
3.0000 mL | Freq: Two times a day (BID) | INTRAMUSCULAR | Status: DC
  Administered 2012-06-11: 22:00:00 via INTRAVENOUS
  Administered 2012-06-12 – 2012-06-20 (×10): 3 mL via INTRAVENOUS

## 2012-06-11 MED ORDER — ACETAMINOPHEN 650 MG RE SUPP: 650.0000 mg | Freq: Four times a day (QID) | RECTAL | Status: DC | PRN

## 2012-06-11 MED ORDER — RALOXIFENE HCL 60 MG PO TABS
60.0000 mg | ORAL_TABLET | Freq: Every day | ORAL | Status: DC
  Administered 2012-06-11 – 2012-06-20 (×10): 60 mg via ORAL
  Filled 2012-06-11 (×10): qty 1

## 2012-06-11 MED ORDER — MORPHINE SULFATE 2 MG/ML IJ SOLN
2.0000 mg | INTRAMUSCULAR | Status: DC | PRN
  Administered 2012-06-11 – 2012-06-12 (×5): 2 mg via INTRAVENOUS
  Filled 2012-06-11 (×5): qty 1

## 2012-06-11 NOTE — Progress Notes (Signed)
78 year old female fell coming out of church and injured her right leg. She has deformity, swelling, tenderness and a right mid thigh and right leg is externally rotated and shortened. She has a tenderness over the hip.   X-rays conclusive. On coumadin for afib , admit to tele. DR Shelle Iron notified by  EDP

## 2012-06-11 NOTE — Consult Note (Signed)
Reason for Consult:right hip pain Referring Physician: dr Lazarus Gowda is an 78 y.o. female.  HPI: Melanie [name] is an 78 year old community ambulator who lives alone who fell today at her common. She denies any loss of consciousness or syncopal episodes. She reports right hip pain and inability to angulate. She denies any other orthopedic complaints. She states that it's time to get her pacemaker checked. She is also on Coumadin for atrial fibrillation.  Past Medical History  Diagnosis Date  . Vertigo   . Paroxysmal atrial fibrillation     on coumadin and amiodarone  . Sinus node dysfunction     s/p Medtronic dual-chamber pacemaker  . Hypercalcemia   . Hypertension   . Dyslipidemia   . CAD (coronary artery disease)     Two vessel, quiescent; 70% LAD aretery disease and well collateralized, 100% RCA by cardiac cath in 2004; NL LVF; low risk Cardioloite; EF 74%, 11/10  . Hypothyroidism     Past Surgical History  Procedure Date  . Pacemaker insertion 05/24/03    MDT Kappa    Family History  Problem Relation Age of Onset  . Hypertension Neg Hx   . Diabetes Neg Hx   . Coronary artery disease Neg Hx     Social History:  reports that she has never smoked. She has never used smokeless tobacco. She reports that she does not drink alcohol or use illicit drugs.  Allergies: No Known Allergies  Medications: I have reviewed the patient's current medications.  Results for orders placed during the hospital encounter of 06/11/12 (from the past 48 hour(s))  CBC WITH DIFFERENTIAL     Status: Abnormal   Collection Time   06/11/12  2:16 PM      Component Value Range Comment   WBC 16.2 (*) 4.0 - 10.5 K/uL    RBC 4.06  3.87 - 5.11 MIL/uL    Hemoglobin 12.2  12.0 - 15.0 g/dL    HCT 16.1  09.6 - 04.5 %    MCV 89.4  78.0 - 100.0 fL    MCH 30.0  26.0 - 34.0 pg    MCHC 33.6  30.0 - 36.0 g/dL    RDW 40.9  81.1 - 91.4 %    Platelets 210  150 - 400 K/uL    Neutrophils Relative 76   43 - 77 %    Neutro Abs 12.3 (*) 1.7 - 7.7 K/uL    Lymphocytes Relative 14  12 - 46 %    Lymphs Abs 2.3  0.7 - 4.0 K/uL    Monocytes Relative 10  3 - 12 %    Monocytes Absolute 1.7 (*) 0.1 - 1.0 K/uL    Eosinophils Relative 0  0 - 5 %    Eosinophils Absolute 0.0  0.0 - 0.7 K/uL    Basophils Relative 0  0 - 1 %    Basophils Absolute 0.0  0.0 - 0.1 K/uL   BASIC METABOLIC PANEL     Status: Abnormal   Collection Time   06/11/12  2:16 PM      Component Value Range Comment   Sodium 136  135 - 145 mEq/L    Potassium 4.1  3.5 - 5.1 mEq/L    Chloride 100  96 - 112 mEq/L    CO2 30  19 - 32 mEq/L    Glucose, Bld 120 (*) 70 - 99 mg/dL    BUN 26 (*) 6 - 23 mg/dL    Creatinine, Ser  0.88  0.50 - 1.10 mg/dL    Calcium 9.8  8.4 - 16.1 mg/dL    GFR calc non Af Amer 59 (*) >90 mL/min    GFR calc Af Amer 68 (*) >90 mL/min   PROTIME-INR     Status: Abnormal   Collection Time   06/11/12  2:16 PM      Component Value Range Comment   Prothrombin Time 29.6 (*) 11.6 - 15.2 seconds    INR 2.76 (*) 0.00 - 1.49     Dg Chest 2 View  06/11/2012  *RADIOLOGY REPORT*  Clinical Data: Fall today.  Right femur fracture.  CHEST - 2 VIEW  Comparison: 06/02/2012.  Findings: 1451 hours. Left subclavian pacemaker leads are unchanged.  Heart size and mediastinal contours are stable allowing for lordotic positioning on the current examination.  Right paratracheal prominence is unchanged and likely vascular.  There are possible calcified right hilar lymph nodes.  The lungs are clear.  There is no pleural effusion or pneumothorax.  No acute fractures are seen.  IMPRESSION: No acute cardiopulmonary process.   Original Report Authenticated By: Gerrianne Scale, M.D.    Dg Hip Complete Right  06/11/2012  *RADIOLOGY REPORT*  Clinical Data: Fall.  Right hip pain.  RIGHT HIP - COMPLETE 2+ VIEW  Comparison: None.  Findings: Reverse trochanteric fracture noted on the right with a mildly comminuted spiral fragment.  Dominant fragments  accordingly include the femoral head and neck fragment with the greater trochanters; and intermediate fragment including the lesser trochanter, and the distal shaft fragment.  Apex anterior angulation noted.  IMPRESSION:  1.  Mildly comminuted reverse trochanteric fracture with spiral component.   Original Report Authenticated By: Dellia Cloud, M.D.    Dg Femur Right  06/11/2012  *RADIOLOGY REPORT*  Clinical Data: Right hip fracture.  RIGHT FEMUR - 2 VIEW  Comparison: 06/11/2012  Findings: The bottom margin of the spiral fracture with reverse trochanteric component is visible.  The distal femoral shaft appears intact.  IMPRESSION:  1.  Proximal femoral fracture includes a reverse trochanteric component and spiral component.  No distal fracture observed.   Original Report Authenticated By: Dellia Cloud, M.D.     Review of Systems  Constitutional: Negative.   HENT: Negative.   Eyes: Negative.   Respiratory: Negative.   Cardiovascular: Negative.   Gastrointestinal: Negative.   Genitourinary: Negative.   Musculoskeletal: Positive for joint pain.  Skin: Negative.   Neurological: Negative.   Endo/Heme/Allergies: Negative.   Psychiatric/Behavioral: Negative.    Blood pressure 117/65, pulse 61, resp. rate 17, SpO2 96.00%. Physical Exam  Constitutional: She appears well-developed.  HENT:  Head: Normocephalic.  Eyes: Pupils are equal, round, and reactive to light.  Neck: Normal range of motion.  Cardiovascular: Normal rate.   Respiratory: Effort normal.  GI: Soft.  Neurological: She is alert.  Skin: Skin is warm.  Psychiatric: She has a normal mood and affect.   examination of the right lower extremity demonstrates palpable pedal pulses swelling in the thigh but compartments are soft she has a trace right knee effusion no abrasions in the hip area 5 out of 5 dorsiflexion plantarflexion strength in the right foot. No left-sided crepitus with knee or ankle or hip range of motion.  Bilateral upper extremities have good range of motion without crepitus in the wrist elbow or shoulder.  Assessment/Plan: Impression is three-part subtrochanteric femur fracture in an otherwise generally healthy female who has a pacemaker and is on Coumadin for atrial  fibrillation. INR currently 2.76 which is too high for the type of surgical intervention she will require which is oh correction internal fixation with each medullary nail placement. This will be a potentially significant surgery with the 2-300 cc of blood loss. The patient understands the risk and benefits of surgical intervention including but not limited to infection nerve vessel damage nonunion malunion as well as the need for more surgery. Plan is for Buck's traction tonight with INR reversal tonight and tomorrow. Hopefully we'll do the surgery tomorrow if the INR is down below 1.6. If not we'll plan for the surgery on Tuesday. N.p.o. after midnight tonight. All questions answered. Medical decision making complicated today tonight by complexity of her problems as well as the decision for surgery.  Eean Buss SCOTT 06/11/2012, 8:12 PM

## 2012-06-11 NOTE — ED Notes (Signed)
Report called to [address] was told nurse was busy will call me back

## 2012-06-11 NOTE — ED Notes (Signed)
Bed:[facility]<BR> Expected date:<BR> Expected time:<BR> Means of arrival:<BR> Comments:<BR> ems fall

## 2012-06-11 NOTE — ED Provider Notes (Signed)
History     CSN: [id number]  Arrival date & time 06/11/12  1317   First MD Initiated Contact with Patient 06/11/12 1332      Chief Complaint  Patient presents with  . Fall  . right hip fracture     (Consider location/radiation/quality/duration/timing/severity/associated sxs/prior treatment) HPI Hx from pt. Melanie [name] is a 78 y.o. female who presents after a fall. She states that she was leaving church when someone bumped into her. She fell onto her right side. She had immediate sharp severe pain to the hip and thigh area which worsens with any movement of her hip. She was unable to get up or wt bear. She denies hitting her head. Denies pain anywhere other than her R hip/thigh area. No distal numbness or weakness. EMS was called. She was given 6mg  morphine en route with good pain control.  She is on coumadin for afib.  Past Medical History  Diagnosis Date  . Vertigo   . Paroxysmal atrial fibrillation     on coumadin and amiodarone  . Sinus node dysfunction     s/p Medtronic dual-chamber pacemaker  . Hypercalcemia   . Hypertension   . Dyslipidemia   . CAD (coronary artery disease)     Two vessel, quiescent; 70% LAD aretery disease and well collateralized, 100% RCA by cardiac cath in 2004; NL LVF; low risk Cardioloite; EF 74%, 11/10  . Hypothyroidism     Past Surgical History  Procedure Date  . Pacemaker insertion 05/24/03    MDT Kappa    Family History  Problem Relation Age of Onset  . Hypertension Neg Hx   . Diabetes Neg Hx   . Coronary artery disease Neg Hx     History  Substance Use Topics  . Smoking status: Never Smoker   . Smokeless tobacco: Never Used  . Alcohol Use: No    OB History    Grav Para Term Preterm Abortions TAB SAB Ect Mult Living                  Review of Systems  Constitutional: Negative.   Respiratory: Negative for chest tightness and shortness of breath.   Cardiovascular: Negative for chest pain and palpitations.    Gastrointestinal: Negative for abdominal pain.  Musculoskeletal: Positive for arthralgias.  Neurological: Negative for dizziness, weakness and headaches.    Allergies  Review of patient's allergies indicates no known allergies.  Home Medications   Current Outpatient Rx  Name Route Sig Dispense Refill  . AMIODARONE HCL 200 MG PO TABS Oral Take 0.5 tablets (100 mg total) by mouth daily. 15 tablet 6  . AMLODIPINE BESYLATE 10 MG PO TABS Oral Take 1 tablet (10 mg total) by mouth daily. 30 tablet 11    Generic WGN:FAOZHYQ 10MG -Please note dose change!  Marland Kitchen VITAMIN D 1000 UNITS PO TABS Oral Take 1,000 Units by mouth daily.      Marland Kitchen CLONAZEPAM 0.5 MG PO TABS Oral Take 0.5 mg by mouth at bedtime as needed.     Marland Kitchen EZETIMIBE 10 MG PO TABS Oral Take 10 mg by mouth daily.      Marland Kitchen FERROUS SULFATE 325 (65 FE) MG PO TABS Oral Take 325 mg by mouth daily with breakfast.    . OMEGA-3 FATTY ACIDS 1000 MG PO CAPS Oral Take 1 g by mouth 2 (two) times daily.     . ISOSORBIDE MONONITRATE 30 MG PO TB24  Take 1/2 by mouth daily 15 tablet 6  . LEVOTHYROXINE  SODIUM 75 MCG PO TABS Oral Take 75 mcg by mouth daily.      Marland Kitchen METOPROLOL TARTRATE 25 MG PO TABS Oral Take 25 mg by mouth 2 (two) times daily.      Marland Kitchen PANTOPRAZOLE SODIUM 40 MG PO TBEC Oral Take 40 mg by mouth daily.      Marland Kitchen RALOXIFENE HCL 60 MG PO TABS Oral Take 60 mg by mouth daily.      Marland Kitchen ROSUVASTATIN CALCIUM 5 MG PO TABS Oral Take 5 mg by mouth Nightly.      . WARFARIN SODIUM 2.5 MG PO TABS  TAKE 1 TABLET BY MOUTH AS DIRECTED EMERGENCY REFILL FAXED DR 30 tablet 2    Generic ZHY:QMVHQION   2.5MG     BP 125/69  Pulse 61  Resp 18  SpO2 96%  Physical Exam  Nursing note and vitals reviewed. Constitutional: She appears well-developed and well-nourished. No distress.  HENT:  Head: Normocephalic and atraumatic.  Neck: Normal range of motion.  Cardiovascular: Normal rate, regular rhythm and normal heart sounds.   Pulmonary/Chest: Effort normal and breath sounds  normal. She exhibits no tenderness.  Abdominal: Soft. Bowel sounds are normal. There is no tenderness. There is no rebound and no guarding.  Musculoskeletal: Normal range of motion.       RLE appears to be shortened and rotated. No focal tenderness to palp about hip at the greater trochanter or into the groin. She is tender to palp to proximal to mid femur area. ROM deferred 2/2 pain. No ttp about knee. Calf supple. NVI distally with sensory intact to lt touch, DP/PT pulses intact.  Neurological: She is alert.  Skin: Skin is warm and dry. She is not diaphoretic.  Psychiatric: She has a normal mood and affect.    ED Course  Procedures (including critical care time)  Labs Reviewed  CBC WITH DIFFERENTIAL - Abnormal; Notable for the following:    WBC 16.2 (*)     Neutro Abs 12.3 (*)     Monocytes Absolute 1.7 (*)     All other components within normal limits  BASIC METABOLIC PANEL - Abnormal; Notable for the following:    Glucose, Bld 120 (*)     BUN 26 (*)     GFR calc non Af Amer 59 (*)     GFR calc Af Amer 68 (*)     All other components within normal limits  PROTIME-INR - Abnormal; Notable for the following:    Prothrombin Time 29.6 (*)     INR 2.76 (*)     All other components within normal limits   Dg Chest 2 View  06/11/2012  *RADIOLOGY REPORT*  Clinical Data: Fall today.  Right femur fracture.  CHEST - 2 VIEW  Comparison: 06/02/2012.  Findings: 1451 hours. Left subclavian pacemaker leads are unchanged.  Heart size and mediastinal contours are stable allowing for lordotic positioning on the current examination.  Right paratracheal prominence is unchanged and likely vascular.  There are possible calcified right hilar lymph nodes.  The lungs are clear.  There is no pleural effusion or pneumothorax.  No acute fractures are seen.  IMPRESSION: No acute cardiopulmonary process.   Original Report Authenticated By: Gerrianne Scale, M.D.    Dg Hip Complete Right  06/11/2012  *RADIOLOGY  REPORT*  Clinical Data: Fall.  Right hip pain.  RIGHT HIP - COMPLETE 2+ VIEW  Comparison: None.  Findings: Reverse trochanteric fracture noted on the right with a mildly comminuted spiral fragment.  Dominant fragments accordingly include the femoral head and neck fragment with the greater trochanters; and intermediate fragment including the lesser trochanter, and the distal shaft fragment.  Apex anterior angulation noted.  IMPRESSION:  1.  Mildly comminuted reverse trochanteric fracture with spiral component.   Original Report Authenticated By: Dellia Cloud, M.D.    Dg Femur Right  06/11/2012  *RADIOLOGY REPORT*  Clinical Data: Right hip fracture.  RIGHT FEMUR - 2 VIEW  Comparison: 06/11/2012  Findings: The bottom margin of the spiral fracture with reverse trochanteric component is visible.  The distal femoral shaft appears intact.  IMPRESSION:  1.  Proximal femoral fracture includes a reverse trochanteric component and spiral component.  No distal fracture observed.   Original Report Authenticated By: Dellia Cloud, M.D.      1. Trochanteric fracture of right femur       MDM  Pt presents after a fall which was mechanical in nature. She reports pain to the right upper leg area. She has a visible rotation and shortening on exam. Neurovasc intact distally. I personally reviewed the plain films which indicate reverse troch fx with spiral component. Pt will be admitted for further evaluation. She is currently on coumadin for atrial fibrillation and has a therapeutic INR at 2.7.   I discussed the case with Dr. August Saucer with ortho at 3:43 PM who will consult. Discussed with hospitalist at 4:26 PM who accepts pt for admission to telemetry. Findings and plan d/w family and pt who are agreeable.  Grant Fontana, PA-C 06/11/12 1626

## 2012-06-11 NOTE — ED Notes (Signed)
Off floor for testing 

## 2012-06-11 NOTE — ED Provider Notes (Addendum)
79 year old female fell coming out of church and injured her right leg. She has deformity, swelling, tenderness and a right mid thigh and right leg is externally rotated and shortened. She has a tenderness over the hip. Clinically, I strongly suspect that she has a midshaft femur fracture. X-rays have been ordered.  Dione Booze, MD 06/11/12 1425  Medical screening examination/treatment/procedure(s) were conducted as a shared visit with non-physician practitioner(s) and myself.  I personally evaluated the patient during the encounter   Dione Booze, MD 06/11/12 1615

## 2012-06-11 NOTE — H&P (Addendum)
[facility] History and Physical  Melanie [name] ZOX:[id number] DOB: 1926-04-29 DOA: 06/11/2012  Referring physician: Dr. Preston Fleeting PCP: Ignatius Specking., MD   Chief Complaint: Fall with fracture of her right femur.  HPI:  78 year old female with history of excisional A. fib on Coumadin, sinus node dysfunction status post pacemaker, hypertension, dyslipidemia, coronary artery disease who was in her usual state of health was brought to the ED after she sustained a mechanical fall and fractured her right femur. She denies any fever, chills, headache, blurring vision, chest pain, palpitations, shortness of breath, abdominal pain, nausea, vomiting, bowel or urinary symptoms. In the ED and history off the right hip was obtained showing fracture of the right femur trochanter. Orthopedic consult was called from the ED and recommended for admission to [facility] given her multiple medical comorbidities. At baseline patient he lives independently and is able to walk a significant distance without any difficulty. She is currently driving as well.   Review of Systems:  Constitutional: Denies fever, chills, diaphoresis, appetite change and fatigue.  HEENT: Denies photophobia, eye pain, redness, hearing loss, ear pain, congestion, sore throat, rhinorrhea, sneezing, mouth sores, trouble swallowing, neck pain, neck stiffness and tinnitus.   Respiratory: Denies SOB, DOE, cough, chest tightness,  and wheezing.   Cardiovascular: Denies chest pain, palpitations and leg swelling.  Gastrointestinal: Denies nausea, vomiting, abdominal pain, diarrhea, constipation, blood in stool and abdominal distention.  Genitourinary: Denies dysuria, urgency, frequency, hematuria, flank pain and difficulty urinating.  Musculoskeletal: pain over right hip and leg  Denies  back pain, joint swelling, arthralgias  Skin: Denies pallor, rash and wound.  Neurological: Denies dizziness, seizures, syncope, weakness, light-headedness, numbness  and headaches.  Hematological: Denies adenopathy. Easy bruising, personal or family bleeding history  Psychiatric/Behavioral: Denies suicidal ideation, mood changes, confusion, nervousness, sleep disturbance and agitation   Past Medical History  Diagnosis Date  . Vertigo   . Paroxysmal atrial fibrillation     on coumadin and amiodarone  . Sinus node dysfunction     s/p Medtronic dual-chamber pacemaker  . Hypercalcemia   . Hypertension   . Dyslipidemia   . CAD (coronary artery disease)     Two vessel, quiescent; 70% LAD aretery disease and well collateralized, 100% RCA by cardiac cath in 2004; NL LVF; low risk Cardioloite; EF 74%, 11/10  . Hypothyroidism    Past Surgical History  Procedure Date  . Pacemaker insertion 05/24/03    MDT Kappa   Social History:  reports that she has never smoked. She has never used smokeless tobacco. She reports that she does not drink alcohol or use illicit drugs.  No Known Allergies  Family History  Problem Relation Age of Onset  . Hypertension Neg Hx   . Diabetes Neg Hx   . Coronary artery disease Neg Hx     Prior to Admission medications   Medication Sig Start Date End Date Taking? Authorizing Provider  amiodarone (PACERONE) 200 MG tablet Take 100 mg by mouth daily. 11/26/11  Yes June Leap, MD  amLODipine (NORVASC) 10 MG tablet Take 10 mg by mouth daily. 01/05/12  Yes Hillis Range, MD  cholecalciferol (VITAMIN D) 1000 UNITS tablet Take 1,000 Units by mouth daily.     Yes Historical Provider, MD  ezetimibe (ZETIA) 10 MG tablet Take 10 mg by mouth daily.     Yes Historical Provider, MD  ferrous sulfate 325 (65 FE) MG tablet Take 325 mg by mouth daily with breakfast.   Yes Historical Provider, MD  fish oil-omega-3 fatty acids 1000 MG capsule Take 1 g by mouth 2 (two) times daily.    Yes Historical Provider, MD  isosorbide mononitrate (IMDUR) 30 MG CR tablet Take 30 mg by mouth. Take 1/2 by mouth daily 12/28/10  Yes June Leap, MD    levothyroxine (SYNTHROID, LEVOTHROID) 75 MCG tablet Take 75 mcg by mouth daily.     Yes Historical Provider, MD  metoprolol tartrate (LOPRESSOR) 25 MG tablet Take 25 mg by mouth 2 (two) times daily.    Yes Historical Provider, MD  pantoprazole (PROTONIX) 40 MG tablet Take 40 mg by mouth daily.     Yes Historical Provider, MD  raloxifene (EVISTA) 60 MG tablet Take 60 mg by mouth daily.     Yes Historical Provider, MD  rosuvastatin (CRESTOR) 5 MG tablet Take 5 mg by mouth Nightly.     Yes Historical Provider, MD  warfarin (COUMADIN) 2.5 MG tablet Take 1.25-2.5 mg by mouth.   Yes Historical Provider, MD    Physical Exam:  Filed Vitals:   06/11/12 1333  BP: 125/69  Pulse: 61  Resp: 18  SpO2: 96%    Constitutional: Vital signs reviewed.  Patient is a well-developed and well-nourished in no acute distress and cooperative with exam. Alert and oriented x3.  Head: Normocephalic and atraumatic Ear: TM normal bilaterally Mouth: no erythema or exudates, MMM Eyes: PERRL, EOMI, conjunctivae normal, No scleral icterus.  Neck: Supple, Trachea midline normal ROM, No JVD, mass, thyromegaly, or carotid bruit present.  Cardiovascular: RRR, S1 normal, S2 normal, no MRG, pulses symmetric and intact bilaterally Pulmonary/Chest: CTAB, no wheezes, rales, or rhonchi Abdominal: Soft. Non-tender, non-distended, bowel sounds are normal, no masses, organomegaly, or guarding present.  GU: no CVA tenderness Musculoskeletal: externally rotated right femur with limited ROM, able to move toes and ankle Ext: no edema and no cyanosis, pulses palpable bilaterally (DP and PT) Hematology: no cervical, inginal, or axillary adenopathy.  Neurological: A&O x3, Strenght is normal and symmetric bilaterally, cranial nerve II-XII are grossly intact, no focal motor deficit, sensory intact to light touch bilaterally.  Skin: Warm, dry and intact. No rash, cyanosis, or clubbing.  Psychiatric: Normal mood and affect. speech and  behavior is normal. Judgment and thought content normal. Cognition and memory are normal.   Labs on Admission:  Basic Metabolic Panel:  Lab 06/11/12 1610  NA 136  K 4.1  CL 100  CO2 30  GLUCOSE 120*  BUN 26*  CREATININE 0.88  CALCIUM 9.8  MG --  PHOS --   Liver Function Tests: No results found for this basename: AST:5,ALT:5,ALKPHOS:5,BILITOT:5,PROT:5,ALBUMIN:5 in the last 168 hours No results found for this basename: LIPASE:5,AMYLASE:5 in the last 168 hours No results found for this basename: AMMONIA:5 in the last 168 hours CBC:  Lab 06/11/12 1416  WBC 16.2*  NEUTROABS 12.3*  HGB 12.2  HCT 36.3  MCV 89.4  PLT 210   Cardiac Enzymes: No results found for this basename: CKTOTAL:5,CKMB:5,CKMBINDEX:5,TROPONINI:5 in the last 168 hours BNP: No components found with this basename: POCBNP:5 CBG: No results found for this basename: GLUCAP:5 in the last 168 hours  Radiological Exams on Admission: Dg Chest 2 View  06/11/2012  *RADIOLOGY REPORT*  Clinical Data: Fall today.  Right femur fracture.  CHEST - 2 VIEW  Comparison: 06/02/2012.  Findings: 1451 hours. Left subclavian pacemaker leads are unchanged.  Heart size and mediastinal contours are stable allowing for lordotic positioning on the current examination.  Right paratracheal prominence is unchanged and  likely vascular.  There are possible calcified right hilar lymph nodes.  The lungs are clear.  There is no pleural effusion or pneumothorax.  No acute fractures are seen.  IMPRESSION: No acute cardiopulmonary process.   Original Report Authenticated By: Gerrianne Scale, M.D.    Dg Hip Complete Right  06/11/2012  *RADIOLOGY REPORT*  Clinical Data: Fall.  Right hip pain.  RIGHT HIP - COMPLETE 2+ VIEW  Comparison: None.  Findings: Reverse trochanteric fracture noted on the right with a mildly comminuted spiral fragment.  Dominant fragments accordingly include the femoral head and neck fragment with the greater trochanters; and  intermediate fragment including the lesser trochanter, and the distal shaft fragment.  Apex anterior angulation noted.  IMPRESSION:  1.  Mildly comminuted reverse trochanteric fracture with spiral component.   Original Report Authenticated By: Dellia Cloud, M.D.    Dg Femur Right  06/11/2012  *RADIOLOGY REPORT*  Clinical Data: Right hip fracture.  RIGHT FEMUR - 2 VIEW  Comparison: 06/11/2012  Findings: The bottom margin of the spiral fracture with reverse trochanteric component is visible.  The distal femoral shaft appears intact.  IMPRESSION:  1.  Proximal femoral fracture includes a reverse trochanteric component and spiral component.  No distal fracture observed.   Original Report Authenticated By: Dellia Cloud, M.D.     EKG:  Assessment/Plan Principal Problem:  *Femur fracture, right Admit to medical floor on telemetry. -Patient will be made n.p.o. for possible surgery of her femur fracture -Will hold her Coumadin. Pain control with when necessary Tylenol and Flexeril for muscle spasms. She is not in any distress when remained still. I will avoid any narcotics. -I. will order gentle hydration. -Follow recommendation as per orthopedics.  Active Problems:  HYPOTHYROIDISM Stable. Continue Synthroid   HYPERLIPIDEMIA-MIXED -On Crestor at home which I will switch to Zocor   HYPERTENSION, BENIGN Resume  home blood pressure medications   CAD, NATIVE VESSEL Continue statin, beta blocker and Imdur   ATRIAL FIBRILLATION, PAROXYSMAL On Coumadin which is on hold for now. Continue amiodarone   Sinoatrial node dysfunction History of pacemaker   Anemia Stable continue iron tablets   Leukocytosis Likely reactive. Will follow  Code Status: Full Code Family Communication: Discussed with family at bedside Disposition Plan: Admit to inpatient telemetry   Total time spent: 70 minutes  Mccall Will [facility] Pager [phone]  If 7PM-7AM, please contact  night-coverage [email or web address] Password [facility] 06/11/2012, 6:02 PM

## 2012-06-11 NOTE — ED Notes (Addendum)
Per EMS, fell [place] right side at church-right hip deformity-6 mg of morphine given in route

## 2012-06-12 LAB — PROTIME-INR
INR: 2.19 — ABNORMAL HIGH (ref 0.00–1.49)
Prothrombin Time: 22.5 seconds — ABNORMAL HIGH (ref 11.6–15.2)

## 2012-06-12 LAB — MRSA PCR SCREENING: MRSA by PCR: NEGATIVE

## 2012-06-12 MED ORDER — PHYTONADIONE 5 MG PO TABS
10.0000 mg | ORAL_TABLET | Freq: Once | ORAL | Status: AC
  Administered 2012-06-12: 10 mg via ORAL
  Filled 2012-06-12: qty 2

## 2012-06-12 NOTE — Progress Notes (Signed)
[facility] PROGRESS NOTE  Melanie [name] WJX:[id number] DOB: [birth year] DOA: 06/11/2012 PCP: Ignatius Specking., MD   Brief narrative:  78 year old female with history of excisional A. fib on Coumadin, sinus node dysfunction status post pacemaker, hypertension, dyslipidemia, coronary artery disease who was in her usual state of health was brought to the ED after she sustained a mechanical fall and fractured her right femur.   Assessment/Plan: Femur fracture, right   -Awaiting surgery given elevated INR which needs to be corrected. Her Coumadin has been held since that admission and INR this morning is 2.19. I have ordered for 10 mg of vitamin K by mouth and will recheck her INR this evening. If improved will be taken to the OR for  fracture repair later this evening otherwise will be scheduled for tomorrow morning. -. Pain control with when necessary Tylenol and Flexeril for muscle spasms.  -Continue gentle hydration.  -Appreciate orthopedics recommendation  Active Problems:  Hypothyroidism Stable. Continue Synthroid   HYPERLIPIDEMIA-MIXED  -On Crestor at home which I will switch to Zocor   HYPERTENSION, BENIGN  Resume home blood pressure medications   CAD, NATIVE VESSEL  Continue statin, beta blocker and Imdur   ATRIAL FIBRILLATION, PAROXYSMAL  On Coumadin which is on hold for now. Continue amiodarone   Sinoatrial node dysfunction  History of pacemaker   Anemia  Stable continue iron tablets   Leukocytosis  Noted or elevated wbc. Appeared to be reactive . No signs of infection. Will follow  Code Status: Full Code  Family Communication: Discussed with family at bedside  Disposition Plan: Admit to inpatient telemetry        Consultants:  Dr August Saucer( orthopedics)    procedures:  Planned for repair of rt femur fracture either tonight or tomorrow am  Antibiotics:  NONE  HPI/Subjective: Feels her right hip pain to be better  Objective: Filed Vitals:   06/11/12 1333 06/11/12 1932 06/11/12 2017 06/12/12 0433  BP: 125/69 117/65 147/55 120/85  Pulse: 61  64 82  Temp:   98.9 F (37.2 C) 97.9 F (36.6 C)  TempSrc:   Oral Oral  Resp: 18 17 17 17   Height:   5\' 4"  (1.626 m)   Weight:   68.04 kg (150 lb)   SpO2: 96%  100% 98%    Intake/Output Summary (Last 24 hours) at 06/12/12 1300 Last data filed at 06/12/12 2130  Gross per 24 hour  Intake 923.34 ml  Output    475 ml  Net 448.34 ml   Filed Weights   06/11/12 2017  Weight: 68.04 kg (150 lb)    Exam:   General:  Elderly female lying in bed in no acute distress  HEENT: No pallor, moist oral mucosa  Cardiovascular: And S2 no murmurs rub or gallop  Respiratory: Breaths Sounds bilaterally  Abdomen: soft ,non tender, non-distended bowel sounds present Foley in place  Extremities: warm, traction in place over rt leg.   CNS: AAOX 3   Data Reviewed: Basic Metabolic Panel:  Lab 06/11/12 8657 06/11/12 1416  NA 133* 136  K 4.5 4.1  CL 97 100  CO2 30 30  GLUCOSE 222* 120*  BUN 31* 26*  CREATININE 1.03 0.88  CALCIUM 9.6 9.8  MG -- --  PHOS -- --   Liver Function Tests: No results found for this basename: AST:5,ALT:5,ALKPHOS:5,BILITOT:5,PROT:5,ALBUMIN:5 in the last 168 hours No results found for this basename: LIPASE:5,AMYLASE:5 in the last 168 hours No results found for this basename: AMMONIA:5 in the last 168  hours CBC:  Lab 06/11/12 2205 06/11/12 1416  WBC 19.0* 16.2*  NEUTROABS -- 12.3*  HGB 10.2* 12.2  HCT 30.1* 36.3  MCV 89.3 89.4  PLT 178 210   Cardiac Enzymes: No results found for this basename: CKTOTAL:5,CKMB:5,CKMBINDEX:5,TROPONINI:5 in the last 168 hours BNP (last 3 results) No results found for this basename: PROBNP:3 in the last 8760 hours CBG: No results found for this basename: GLUCAP:5 in the last 168 hours  No results found for this or any previous visit (from the past 240 hour(s)).   Studies: Dg Chest 2 View  06/11/2012  *RADIOLOGY  REPORT*  Clinical Data: Fall today.  Right femur fracture.  CHEST - 2 VIEW  Comparison: 06/02/2012.  Findings: 1451 hours. Left subclavian pacemaker leads are unchanged.  Heart size and mediastinal contours are stable allowing for lordotic positioning on the current examination.  Right paratracheal prominence is unchanged and likely vascular.  There are possible calcified right hilar lymph nodes.  The lungs are clear.  There is no pleural effusion or pneumothorax.  No acute fractures are seen.  IMPRESSION: No acute cardiopulmonary process.   Original Report Authenticated By: Gerrianne Scale, M.D.    Dg Hip Complete Right  06/11/2012  *RADIOLOGY REPORT*  Clinical Data: Fall.  Right hip pain.  RIGHT HIP - COMPLETE 2+ VIEW  Comparison: None.  Findings: Reverse trochanteric fracture noted on the right with a mildly comminuted spiral fragment.  Dominant fragments accordingly include the femoral head and neck fragment with the greater trochanters; and intermediate fragment including the lesser trochanter, and the distal shaft fragment.  Apex anterior angulation noted.  IMPRESSION:  1.  Mildly comminuted reverse trochanteric fracture with spiral component.   Original Report Authenticated By: Dellia Cloud, M.D.    Dg Femur Right  06/11/2012  *RADIOLOGY REPORT*  Clinical Data: Right hip fracture.  RIGHT FEMUR - 2 VIEW  Comparison: 06/11/2012  Findings: The bottom margin of the spiral fracture with reverse trochanteric component is visible.  The distal femoral shaft appears intact.  IMPRESSION:  1.  Proximal femoral fracture includes a reverse trochanteric component and spiral component.  No distal fracture observed.   Original Report Authenticated By: Dellia Cloud, M.D.     Scheduled Meds:   . amiodarone  100 mg Oral Daily  . amLODipine  10 mg Oral Daily  . cholecalciferol  1,000 Units Oral Daily  . cyclobenzaprine  5 mg Oral TID  . docusate sodium  100 mg Oral BID  . ezetimibe  10 mg Oral  Daily  . fentaNYL  50 mcg Intravenous Once  . ferrous sulfate  325 mg Oral Q breakfast  . isosorbide mononitrate  30 mg Oral Daily  . levothyroxine  75 mcg Oral Q0600  . metoprolol tartrate  25 mg Oral BID  .  morphine injection  2 mg Intravenous Once  . ondansetron (ZOFRAN) IV  4 mg Intravenous Once  . pantoprazole  40 mg Oral Q1200  . phytonadione  10 mg Oral Once  . raloxifene  60 mg Oral Daily  . sodium chloride  3 mL Intravenous Q12H  . DISCONTD: omega-3 acid ethyl esters  1 g Oral BID   Continuous Infusions:   . dextrose 5 % and 0.45% NaCl 100 mL/hr at 06/12/12 1610      Time spent:  30 minutes    Aki Abalos  [facility] Pager [phone]. If 8PM-8AM, please contact night-coverage at [email or web address], password [facility] 06/12/2012, 1:00 PM  LOS: 1 day

## 2012-06-12 NOTE — Progress Notes (Signed)
Pt stable - pain controlled Df pf ok right foot Thigh swollen but compts ok Plan or when coags closer to normal

## 2012-06-13 ENCOUNTER — Inpatient Hospital Stay (HOSPITAL_COMMUNITY): Payer: Medicare Other | Admitting: Anesthesiology

## 2012-06-13 ENCOUNTER — Inpatient Hospital Stay (HOSPITAL_COMMUNITY): Payer: Medicare Other

## 2012-06-13 ENCOUNTER — Encounter (HOSPITAL_COMMUNITY)
Admission: EM | Disposition: A | Discharge status: Skilled Nursing Facility | Payer: Self-pay | Source: Home / Self Care | Attending: Orthopedic Surgery

## 2012-06-13 ENCOUNTER — Encounter (HOSPITAL_COMMUNITY): Payer: Self-pay | Admitting: Anesthesiology

## 2012-06-13 DIAGNOSIS — D649 Anemia, unspecified: Secondary | ICD-10-CM

## 2012-06-13 LAB — URINALYSIS, MICROSCOPIC ONLY
Ketones, ur: NEGATIVE mg/dL
Protein, ur: NEGATIVE mg/dL
Urobilinogen, UA: 0.2 mg/dL (ref 0.0–1.0)

## 2012-06-13 LAB — CBC
HCT: 21.1 % — ABNORMAL LOW (ref 36.0–46.0)
Hemoglobin: 7.2 g/dL — ABNORMAL LOW (ref 12.0–15.0)
MCH: 30.5 pg (ref 26.0–34.0)
MCV: 89.4 fL (ref 78.0–100.0)
RBC: 2.36 MIL/uL — ABNORMAL LOW (ref 3.87–5.11)

## 2012-06-13 LAB — SURGICAL PCR SCREEN: Staphylococcus aureus: NEGATIVE

## 2012-06-13 LAB — PREPARE RBC (CROSSMATCH)

## 2012-06-13 SURGERY — FIXATION, FRACTURE, INTERTROCHANTERIC, WITH INTRAMEDULLARY ROD
Anesthesia: General | Site: Hip | Laterality: Right | Wound class: Clean

## 2012-06-13 MED ORDER — ONDANSETRON HCL 4 MG/2ML IJ SOLN
INTRAMUSCULAR | Status: DC | PRN
  Administered 2012-06-13: 4 mg via INTRAVENOUS

## 2012-06-13 MED ORDER — NEOSTIGMINE METHYLSULFATE 1 MG/ML IJ SOLN
INTRAMUSCULAR | Status: DC | PRN
  Administered 2012-06-13: 3 mg via INTRAVENOUS

## 2012-06-13 MED ORDER — CEFAZOLIN SODIUM-DEXTROSE 2-3 GM-% IV SOLR
INTRAVENOUS | Status: DC | PRN
  Administered 2012-06-13: 2 g via INTRAVENOUS

## 2012-06-13 MED ORDER — PROPOFOL 10 MG/ML IV BOLUS
INTRAVENOUS | Status: DC | PRN
  Administered 2012-06-13: 120 mg via INTRAVENOUS

## 2012-06-13 MED ORDER — FENTANYL CITRATE 0.05 MG/ML IJ SOLN
INTRAMUSCULAR | Status: DC | PRN
  Administered 2012-06-13: 50 ug via INTRAVENOUS
  Administered 2012-06-13: 100 ug via INTRAVENOUS
  Administered 2012-06-13: 50 ug via INTRAVENOUS
  Administered 2012-06-13: 100 ug via INTRAVENOUS
  Administered 2012-06-13 (×2): 50 ug via INTRAVENOUS

## 2012-06-13 MED ORDER — SUCCINYLCHOLINE CHLORIDE 20 MG/ML IJ SOLN
INTRAMUSCULAR | Status: DC | PRN
  Administered 2012-06-13: 100 mg via INTRAVENOUS

## 2012-06-13 MED ORDER — LACTATED RINGERS IV SOLN
INTRAVENOUS | Status: DC | PRN
  Administered 2012-06-13 (×2): via INTRAVENOUS

## 2012-06-13 MED ORDER — ROCURONIUM BROMIDE 100 MG/10ML IV SOLN
INTRAVENOUS | Status: DC | PRN
  Administered 2012-06-13: 30 mg via INTRAVENOUS
  Administered 2012-06-13: 20 mg via INTRAVENOUS

## 2012-06-13 MED ORDER — GLYCOPYRROLATE 0.2 MG/ML IJ SOLN
INTRAMUSCULAR | Status: DC | PRN
  Administered 2012-06-13: 0.4 mg via INTRAVENOUS

## 2012-06-13 MED ORDER — SIMVASTATIN 5 MG PO TABS
5.0000 mg | ORAL_TABLET | Freq: Every day | ORAL | Status: DC
  Administered 2012-06-14 (×2): 5 mg via ORAL
  Filled 2012-06-13 (×4): qty 1

## 2012-06-13 MED ORDER — PHENYLEPHRINE HCL 10 MG/ML IJ SOLN
INTRAMUSCULAR | Status: DC | PRN
  Administered 2012-06-13 (×2): 80 ug via INTRAVENOUS

## 2012-06-13 MED ORDER — LIDOCAINE HCL (CARDIAC) 20 MG/ML IV SOLN
INTRAVENOUS | Status: DC | PRN
  Administered 2012-06-13: 50 mg via INTRAVENOUS

## 2012-06-13 MED ORDER — SODIUM CHLORIDE 0.9 % IV SOLN
INTRAVENOUS | Status: DC | PRN
  Administered 2012-06-13: 21:00:00 via INTRAVENOUS

## 2012-06-13 SURGICAL SUPPLY — 47 items
BIT DRILL 3.5 QC 155 (BIT) ×1 IMPLANT
BLADE SURG 15 STRL LF DISP TIS (BLADE) IMPLANT
BLADE SURG 15 STRL SS (BLADE) ×2
CABLE 2.0 (Cable) ×4 IMPLANT
CLOTH BEACON ORANGE TIMEOUT ST (SAFETY) ×1 IMPLANT
COVER MAYO STAND STRL (DRAPES) ×1 IMPLANT
COVER SURGICAL LIGHT HANDLE (MISCELLANEOUS) ×1 IMPLANT
DRAPE ORTHO SPLIT 77X108 STRL (DRAPES) ×2
DRAPE STERI IOBAN 125X83 (DRAPES) ×1 IMPLANT
DRAPE SURG ORHT 6 SPLT 77X108 (DRAPES) IMPLANT
DRSG MEPILEX BORDER 4X12 (GAUZE/BANDAGES/DRESSINGS) ×1 IMPLANT
DRSG MEPILEX BORDER 4X4 (GAUZE/BANDAGES/DRESSINGS) ×1 IMPLANT
DRSG MEPILEX BORDER 4X8 (GAUZE/BANDAGES/DRESSINGS) ×1 IMPLANT
DURAPREP 26ML APPLICATOR (WOUND CARE) ×1 IMPLANT
ELECT REM PT RETURN 9FT ADLT (ELECTROSURGICAL) ×2
ELECTRODE REM PT RTRN 9FT ADLT (ELECTROSURGICAL) IMPLANT
FACESHIELD LNG OPTICON STERILE (SAFETY) ×1 IMPLANT
GAUZE XEROFORM 5X9 LF (GAUZE/BANDAGES/DRESSINGS) ×2 IMPLANT
GLOVE SURG ORTHO 8.0 STRL STRW (GLOVE) ×1 IMPLANT
GOWN PREVENTION PLUS LG XLONG (DISPOSABLE) ×1 IMPLANT
GOWN PREVENTION PLUS XLARGE (GOWN DISPOSABLE) ×1 IMPLANT
GOWN STRL NON-REIN LRG LVL3 (GOWN DISPOSABLE) ×1 IMPLANT
GUIDE PIN 3.2 LONG (PIN) ×1 IMPLANT
GUIDE PIN 3.2MM (MISCELLANEOUS) ×2
GUIDE PIN ORTH 343X3.2XBRAD (MISCELLANEOUS) IMPLANT
GUIDE ROD 3.0 (MISCELLANEOUS) ×2
HIP SCREW SET (Screw) ×1 IMPLANT
KIT BASIN OR (CUSTOM PROCEDURE TRAY) ×1 IMPLANT
MANIFOLD NEPTUNE II (INSTRUMENTS) ×2 IMPLANT
NAIL IMHS 10X36 R (Nail) ×1 IMPLANT
NS IRRIG 1000ML POUR BTL (IV SOLUTION) ×1 IMPLANT
PACK GENERAL/GYN (CUSTOM PROCEDURE TRAY) ×1 IMPLANT
ROD GUIDE 3.0 (MISCELLANEOUS) IMPLANT
SCREW 4.5X34 (Screw) ×1 IMPLANT
SCREW COMPRESSION (Screw) ×1 IMPLANT
SCREW LAG 85MM (Screw) ×2 IMPLANT
SCREW LAGSTD 85X21X12.7X9 (Screw) IMPLANT
SPONGE LAP 4X18 X RAY DECT (DISPOSABLE) ×1 IMPLANT
STAPLER VISISTAT 35W (STAPLE) ×3 IMPLANT
SUT ETHILON 3 0 PS 1 (SUTURE) ×1 IMPLANT
SUT VIC AB 1 CT1 27 (SUTURE) ×8
SUT VIC AB 1 CT1 27XBRD ANTBC (SUTURE) IMPLANT
SUT VIC AB 2-0 CT1 27 (SUTURE) ×4
SUT VIC AB 2-0 CT1 TAPERPNT 27 (SUTURE) IMPLANT
TAPE STRIPS DRAPE STRL (GAUZE/BANDAGES/DRESSINGS) ×1 IMPLANT
TOWEL OR 17X26 10 PK STRL BLUE (TOWEL DISPOSABLE) ×1 IMPLANT
WATER STERILE IRR 1000ML POUR (IV SOLUTION) ×2 IMPLANT

## 2012-06-13 NOTE — Brief Op Note (Signed)
06/11/2012 - 06/13/2012  11:13 PM  PATIENT:  Melanie [name]  78 y.o. female  PRE-OPERATIVE DIAGNOSIS:  right hip sub-troch fracture  POST-OPERATIVE DIAGNOSIS:  right hip sub -troch fracture  PROCEDURE:  Procedure(s): INTRAMEDULLARY (IM) NAIL INTERTROCHANTERIC  SURGEON:  S Terrionna Bridwell md  ASSISTANT: D lavender md  ANESTHESIA:   general  EBL: 600 ml    Total I/O In: 2300 [I.V.:1600; Blood:700] Out: 925 [Urine:425; Blood:500]  BLOOD ADMINISTERED: 2 u prbc  DRAINS: none   LOCAL MEDICATIONS USED:  none  SPECIMEN:  No Specimen  COUNTS:  YES  TOURNIQUET:  * No tourniquets in log *  DICTATION: .Other Dictation: Dictation Number [id number]   PLAN OF CARE: Admit to inpatient   PATIENT DISPOSITION:  PACU - hemodynamically stable

## 2012-06-13 NOTE — Anesthesia Postprocedure Evaluation (Signed)
  Anesthesia Post-op Note  Patient: Melanie [name]  Procedure(s) Performed: Procedure(s) (LRB): INTRAMEDULLARY (IM) NAIL INTERTROCHANTERIC (Right)  Patient Location: PACU  Anesthesia Type: General  Level of Consciousness: awake and alert   Airway and Oxygen Therapy: Patient Spontanous Breathing  Post-op Pain: mild  Post-op Assessment: Post-op Vital signs reviewed, Patient's Cardiovascular Status Stable, Respiratory Function Stable, Patent Airway and No signs of Nausea or vomiting  Post-op Vital Signs: stable  Complications: No apparent anesthesia complications

## 2012-06-13 NOTE — Anesthesia Preprocedure Evaluation (Addendum)
Anesthesia Evaluation  Patient identified by MRN, date of birth, ID band Patient awake    Reviewed: Allergy & Precautions, H&P , NPO status , Patient's Chart, lab work & pertinent test results  Airway Mallampati: II TM Distance: <3 FB Neck ROM: Limited    Dental No notable dental hx.    Pulmonary neg pulmonary ROS,  breath sounds clear to auscultation  Pulmonary exam normal       Cardiovascular hypertension, Pt. on medications + CAD + dysrhythmias Atrial Fibrillation + pacemaker Rhythm:Irregular Rate:Normal     Neuro/Psych negative neurological ROS  negative psych ROS   GI/Hepatic negative GI ROS, Neg liver ROS,   Endo/Other  Hypothyroidism   Renal/GU negative Renal ROS  negative genitourinary   Musculoskeletal negative musculoskeletal ROS (+)   Abdominal   Peds negative pediatric ROS (+)  Hematology negative hematology ROS (+)   Anesthesia Other Findings   Reproductive/Obstetrics negative OB ROS                          Anesthesia Physical Anesthesia Plan  ASA: III  Anesthesia Plan: General   Post-op Pain Management:    Induction: Intravenous  Airway Management Planned: Oral ETT  Additional Equipment:   Intra-op Plan:   Post-operative Plan: Extubation in OR  Informed Consent: I have reviewed the patients History and Physical, chart, labs and discussed the procedure including the risks, benefits and alternatives for the proposed anesthesia with the patient or authorized representative who has indicated his/her understanding and acceptance.   Dental advisory given  Plan Discussed with: CRNA and Surgeon  Anesthesia Plan Comments:         Anesthesia Quick Evaluation

## 2012-06-13 NOTE — ED Provider Notes (Signed)
Medical screening examination/treatment/procedure(s) were conducted as a shared visit with non-physician practitioner(s) and myself.  I personally evaluated the patient during the encounter   Dione Booze, MD 06/13/12 [phone]

## 2012-06-13 NOTE — Progress Notes (Signed)
Pt stable - inr ok for surgery hgb decreasing - will need transfusion in periop periodr

## 2012-06-13 NOTE — Progress Notes (Signed)
[facility] PROGRESS NOTE  Melanie [name] ZOX:[id number] DOB: 16-May-1926 DOA: 06/11/2012 PCP: Ignatius Specking., MD  Brief narrative:  78 year old female with history of excisional A. fib on Coumadin, sinus node dysfunction status post pacemaker, hypertension, dyslipidemia, coronary artery disease who was in her usual state of health was brought to the ED after she sustained a mechanical fall and fractured her right femur.   Assessment/Plan:  Femur fracture, right  -Awaiting surgery given elevated INR . Coumadin held and given 10 mg of vitamin K on 9/16. Her INR has improved this morning to 1.24 and as per the nurse she scheduled for surgery this evening. -Continue gentle hydration.  -Appreciate orthopedics recommendations  Active Problems:  Anemia. Take iron tablets at home. Noted for drop in hemoglobin to 7.2 this morning I have ordered for 2 units PRBC transfusion. Monitor H&H in the morning.  Hypothyroidism  Stable. Continue Synthroid   HYPERLIPIDEMIA-MIXED  -On Crestor at home which I will switch to Zocor   HYPERTENSION, BENIGN  Resume home blood pressure medications   CAD, NATIVE VESSEL  Continue statin, beta blocker and Imdur   ATRIAL FIBRILLATION, PAROXYSMAL  On Coumadin which is on hold for now. Continue amiodarone   Sinoatrial node dysfunction  History of pacemaker    Leukocytosis  Noted or elevated wbc. Appeared to be reactive . No signs of infection. Will check UA  Code Status: Full Code  Family Communication: None at bedside today Disposition Plan: Pending surgery today     Consultants:  Orthopedics  Procedures:  Plan for right femur fracture repair today  Antibiotics:  None  HPI/Subjective: No overnight issues. Patient currently n.p.o. for planned surgery later this evening. Right leg pain only on movement.  Objective: Filed Vitals:   06/12/12 1343 06/12/12 2045 06/13/12 0550 06/13/12 1031  BP: 120/56 147/49 133/44 153/44  Pulse: 66 76  66   Temp: 98.4 F (36.9 C) 99.2 F (37.3 C) 99.1 F (37.3 C)   TempSrc: Oral Oral Oral   Resp: 20 18 18    Height:      Weight:      SpO2: 91% 97% 94%     Intake/Output Summary (Last 24 hours) at 06/13/12 1104 Last data filed at 06/13/12 0700  Gross per 24 hour  Intake 2178.33 ml  Output   1000 ml  Net 1178.33 ml   Filed Weights   06/11/12 2017  Weight: 68.04 kg (150 lb)    Exam:    General: Elderly female lying in bed in no acute distress  HEENT: No pallor, moist oral mucosa  Cardiovascular: And S2 no murmurs rub or gallop  Respiratory: Breaths Sounds bilaterally  Abdomen: soft ,non tender, non-distended bowel sounds present Foley in place  Extremities: warm, traction in place over rt leg.  CNS: AAOX 3   Data Reviewed: Basic Metabolic Panel:  Lab 06/11/12 8119 06/11/12 1416  NA 133* 136  K 4.5 4.1  CL 97 100  CO2 30 30  GLUCOSE 222* 120*  BUN 31* 26*  CREATININE 1.03 0.88  CALCIUM 9.6 9.8  MG -- --  PHOS -- --   Liver Function Tests: No results found for this basename: AST:5,ALT:5,ALKPHOS:5,BILITOT:5,PROT:5,ALBUMIN:5 in the last 168 hours No results found for this basename: LIPASE:5,AMYLASE:5 in the last 168 hours No results found for this basename: AMMONIA:5 in the last 168 hours CBC:  Lab 06/13/12 0415 06/11/12 2205 06/11/12 1416  WBC 17.5* 19.0* 16.2*  NEUTROABS -- -- 12.3*  HGB 7.2* 10.2* 12.2  HCT  21.1* 30.1* 36.3  MCV 89.4 89.3 89.4  PLT 130* 178 210   Cardiac Enzymes: No results found for this basename: CKTOTAL:5,CKMB:5,CKMBINDEX:5,TROPONINI:5 in the last 168 hours BNP (last 3 results) No results found for this basename: PROBNP:3 in the last 8760 hours CBG: No results found for this basename: GLUCAP:5 in the last 168 hours  Recent Results (from the past 240 hour(s))  MRSA PCR SCREENING     Status: Normal   Collection Time   06/12/12 12:22 PM      Component Value Range Status Comment   MRSA by PCR NEGATIVE  NEGATIVE Final       Studies: Dg Chest 2 View  06/11/2012  *RADIOLOGY REPORT*  Clinical Data: Fall today.  Right femur fracture.  CHEST - 2 VIEW  Comparison: 06/02/2012.  Findings: 1451 hours. Left subclavian pacemaker leads are unchanged.  Heart size and mediastinal contours are stable allowing for lordotic positioning on the current examination.  Right paratracheal prominence is unchanged and likely vascular.  There are possible calcified right hilar lymph nodes.  The lungs are clear.  There is no pleural effusion or pneumothorax.  No acute fractures are seen.  IMPRESSION: No acute cardiopulmonary process.   Original Report Authenticated By: Gerrianne Scale, M.D.    Dg Hip Complete Right  06/11/2012  *RADIOLOGY REPORT*  Clinical Data: Fall.  Right hip pain.  RIGHT HIP - COMPLETE 2+ VIEW  Comparison: None.  Findings: Reverse trochanteric fracture noted on the right with a mildly comminuted spiral fragment.  Dominant fragments accordingly include the femoral head and neck fragment with the greater trochanters; and intermediate fragment including the lesser trochanter, and the distal shaft fragment.  Apex anterior angulation noted.  IMPRESSION:  1.  Mildly comminuted reverse trochanteric fracture with spiral component.   Original Report Authenticated By: Dellia Cloud, M.D.    Dg Femur Right  06/11/2012  *RADIOLOGY REPORT*  Clinical Data: Right hip fracture.  RIGHT FEMUR - 2 VIEW  Comparison: 06/11/2012  Findings: The bottom margin of the spiral fracture with reverse trochanteric component is visible.  The distal femoral shaft appears intact.  IMPRESSION:  1.  Proximal femoral fracture includes a reverse trochanteric component and spiral component.  No distal fracture observed.   Original Report Authenticated By: Dellia Cloud, M.D.     Scheduled Meds:   . amiodarone  100 mg Oral Daily  . amLODipine  10 mg Oral Daily  . cholecalciferol  1,000 Units Oral Daily  . cyclobenzaprine  5 mg Oral TID  .  docusate sodium  100 mg Oral BID  . ezetimibe  10 mg Oral Daily  . ferrous sulfate  325 mg Oral Q breakfast  . isosorbide mononitrate  30 mg Oral Daily  . levothyroxine  75 mcg Oral Q0600  . metoprolol tartrate  25 mg Oral BID  . pantoprazole  40 mg Oral Q1200  . phytonadione  10 mg Oral Once  . raloxifene  60 mg Oral Daily  . sodium chloride  3 mL Intravenous Q12H   Continuous Infusions:   . dextrose 5 % and 0.45% NaCl 100 mL/hr at 06/13/12 0656       Time spent: 30 minutes    Tyron Manetta  [facility] Pager [phone] If 8PM-8AM, please contact night-coverage at [email or web address], password [facility] 06/13/2012, 11:04 AM  LOS: 2 days

## 2012-06-13 NOTE — Transfer of Care (Signed)
Immediate Anesthesia Transfer of Care Note  Patient: Melanie [name]  Procedure(s) Performed: Procedure(s) (LRB) with comments: INTRAMEDULLARY (IM) NAIL INTERTROCHANTERIC (Right) - OPEN REDUCTION INTERNAL FIXATION /RIGHT HIP SUB-TROCH   Patient Location: PACU  Anesthesia Type: General  Level of Consciousness: awake, sedated and patient cooperative  Airway & Oxygen Therapy: Patient Spontanous Breathing and Patient connected to face mask oxygen  Post-op Assessment: Report given to PACU RN and Post -op Vital signs reviewed and stable  Post vital signs: Reviewed and stable  Complications: No apparent anesthesia complications

## 2012-06-13 NOTE — Progress Notes (Signed)
   CARE MANAGEMENT NOTE 06/13/2012  Patient:  Melanie [name], Melanie [name].   Account Number:  [id number]  Date Initiated:  06/13/2012  Documentation initiated by:  Jiles Crocker  Subjective/Objective Assessment:   ADMITTED WITH RIGHT FEMUR FRACTURE     Action/Plan:   PCP: Ignatius Specking., MD  LIVES AT HOME ALONE; POSSIBLE NEED SHORT TERM SNF AT DISCHARGE; [facility] WORKER REFERRAL PLACED   Anticipated DC Date:  06/20/2012   Anticipated DC Plan:  SKILLED NURSING FACILITY  In-house referral  Clinical Social Worker      DC Planning Services  CM consult           Status of service:  In process, will continue to follow Medicare Important Message given?  NA - LOS <3 / Initial given by admissions (If response is "NO", the following Medicare IM given date fields will be blank)  Per UR Regulation:  Reviewed for med. necessity/level of care/duration of stay   Comments:  06/13/2012- B Harrol Novello RN, BSN, MHA

## 2012-06-13 NOTE — Preoperative (Signed)
Beta Blockers   Reason not to administer Beta Blockers:Took Metoprolol this am at 1030.

## 2012-06-14 ENCOUNTER — Inpatient Hospital Stay (HOSPITAL_COMMUNITY): Payer: Medicare Other

## 2012-06-14 LAB — BASIC METABOLIC PANEL
BUN: 17 mg/dL (ref 6–23)
CO2: 24 mEq/L (ref 19–32)
Chloride: 97 mEq/L (ref 96–112)
Glucose, Bld: 168 mg/dL — ABNORMAL HIGH (ref 70–99)
Potassium: 4.6 mEq/L (ref 3.5–5.1)

## 2012-06-14 LAB — TYPE AND SCREEN
Unit division: 0
Unit division: 0

## 2012-06-14 LAB — CBC WITH DIFFERENTIAL/PLATELET
Basophils Relative: 0 % (ref 0–1)
Eosinophils Relative: 0 % (ref 0–5)
Eosinophils Relative: 0 % (ref 0–5)
HCT: 25.4 % — ABNORMAL LOW (ref 36.0–46.0)
HCT: 23.7 % — ABNORMAL LOW (ref 36.0–46.0)
Hemoglobin: 8.1 g/dL — ABNORMAL LOW (ref 12.0–15.0)
Lymphocytes Relative: 8 % — ABNORMAL LOW (ref 12–46)
Lymphs Abs: 1.1 10*3/uL (ref 0.7–4.0)
Lymphs Abs: 1.3 10*3/uL (ref 0.7–4.0)
MCH: 28.9 pg (ref 26.0–34.0)
MCV: 83.8 fL (ref 78.0–100.0)
MCV: 84.6 fL (ref 78.0–100.0)
Monocytes Absolute: 2 10*3/uL — ABNORMAL HIGH (ref 0.1–1.0)
Monocytes Absolute: 1.6 10*3/uL — ABNORMAL HIGH (ref 0.1–1.0)
Neutro Abs: 11.3 10*3/uL — ABNORMAL HIGH (ref 1.7–7.7)
Neutro Abs: 11.5 10*3/uL — ABNORMAL HIGH (ref 1.7–7.7)
Neutrophils Relative %: 80 % — ABNORMAL HIGH (ref 43–77)
Platelets: 84 10*3/uL — ABNORMAL LOW (ref 150–400)
RBC: 3.03 MIL/uL — ABNORMAL LOW (ref 3.87–5.11)
RBC: 2.8 MIL/uL — ABNORMAL LOW (ref 3.87–5.11)
WBC: 14.4 10*3/uL — ABNORMAL HIGH (ref 4.0–10.5)

## 2012-06-14 LAB — CBC
HCT: 28.7 % — ABNORMAL LOW (ref 36.0–46.0)
Hemoglobin: 10 g/dL — ABNORMAL LOW (ref 12.0–15.0)
RBC: 3.39 MIL/uL — ABNORMAL LOW (ref 3.87–5.11)
WBC: 16.2 10*3/uL — ABNORMAL HIGH (ref 4.0–10.5)

## 2012-06-14 LAB — GLUCOSE, CAPILLARY
Glucose-Capillary: 169 mg/dL — ABNORMAL HIGH (ref 70–99)
Glucose-Capillary: 194 mg/dL — ABNORMAL HIGH (ref 70–99)

## 2012-06-14 MED ORDER — WARFARIN - PHARMACIST DOSING INPATIENT: Freq: Every day | Status: DC

## 2012-06-14 MED ORDER — ACETAMINOPHEN 650 MG RE SUPP: 650.0000 mg | Freq: Four times a day (QID) | RECTAL | Status: DC | PRN

## 2012-06-14 MED ORDER — ONDANSETRON HCL 4 MG/2ML IJ SOLN: 4.0000 mg | Freq: Four times a day (QID) | INTRAMUSCULAR | Status: DC | PRN

## 2012-06-14 MED ORDER — WARFARIN SODIUM 3 MG PO TABS
3.0000 mg | ORAL_TABLET | Freq: Once | ORAL | Status: AC
  Administered 2012-06-14: 3 mg via ORAL
  Filled 2012-06-14: qty 1

## 2012-06-14 MED ORDER — MENTHOL 3 MG MT LOZG
1.0000 | LOZENGE | OROMUCOSAL | Status: DC | PRN
  Filled 2012-06-14: qty 9

## 2012-06-14 MED ORDER — METOCLOPRAMIDE HCL 10 MG PO TABS: 5.0000 mg | ORAL_TABLET | Freq: Three times a day (TID) | ORAL | Status: DC | PRN

## 2012-06-14 MED ORDER — ONDANSETRON HCL 4 MG PO TABS: 4.0000 mg | ORAL_TABLET | Freq: Four times a day (QID) | ORAL | Status: DC | PRN

## 2012-06-14 MED ORDER — POTASSIUM CHLORIDE IN NACL 20-0.9 MEQ/L-% IV SOLN
INTRAVENOUS | Status: DC
  Administered 2012-06-14: 06:00:00 via INTRAVENOUS
  Filled 2012-06-14 (×2): qty 1000

## 2012-06-14 MED ORDER — ACETAMINOPHEN 325 MG PO TABS
650.0000 mg | ORAL_TABLET | Freq: Four times a day (QID) | ORAL | Status: DC | PRN
  Administered 2012-06-15: 325 mg via ORAL
  Administered 2012-06-17 – 2012-06-18 (×2): 650 mg via ORAL
  Filled 2012-06-14 (×3): qty 2

## 2012-06-14 MED ORDER — METOCLOPRAMIDE HCL 5 MG/ML IJ SOLN: 5.0000 mg | Freq: Three times a day (TID) | INTRAMUSCULAR | Status: DC | PRN

## 2012-06-14 MED ORDER — LACTATED RINGERS IV SOLN
INTRAVENOUS | Status: DC
  Administered 2012-06-14: 01:00:00 via INTRAVENOUS

## 2012-06-14 MED ORDER — INSULIN ASPART 100 UNIT/ML ~~LOC~~ SOLN
0.0000 [IU] | Freq: Three times a day (TID) | SUBCUTANEOUS | Status: DC
  Administered 2012-06-14 (×3): 2 [IU] via SUBCUTANEOUS
  Administered 2012-06-15: 1 [IU] via SUBCUTANEOUS
  Administered 2012-06-15 – 2012-06-16 (×3): 2 [IU] via SUBCUTANEOUS
  Administered 2012-06-16 – 2012-06-17 (×2): 1 [IU] via SUBCUTANEOUS

## 2012-06-14 MED ORDER — MORPHINE SULFATE 2 MG/ML IJ SOLN
0.5000 mg | INTRAMUSCULAR | Status: DC | PRN
  Administered 2012-06-19: 0.5 mg via INTRAVENOUS
  Filled 2012-06-14: qty 1

## 2012-06-14 MED ORDER — PHENOL 1.4 % MT LIQD
1.0000 | OROMUCOSAL | Status: DC | PRN
  Filled 2012-06-14: qty 177

## 2012-06-14 MED ORDER — FENTANYL CITRATE 0.05 MG/ML IJ SOLN: 25.0000 ug | INTRAMUSCULAR | Status: DC | PRN

## 2012-06-14 MED ORDER — PROMETHAZINE HCL 25 MG/ML IJ SOLN: 6.2500 mg | INTRAMUSCULAR | Status: DC | PRN

## 2012-06-14 MED ORDER — CEFAZOLIN SODIUM-DEXTROSE 2-3 GM-% IV SOLR
2.0000 g | Freq: Three times a day (TID) | INTRAVENOUS | Status: AC
  Administered 2012-06-14 (×3): 2 g via INTRAVENOUS
  Filled 2012-06-14 (×3): qty 50

## 2012-06-14 MED ORDER — HYDROCODONE-ACETAMINOPHEN 5-325 MG PO TABS
1.0000 | ORAL_TABLET | Freq: Four times a day (QID) | ORAL | Status: DC | PRN
  Administered 2012-06-14 – 2012-06-19 (×8): 1 via ORAL
  Administered 2012-06-20: 2 via ORAL
  Filled 2012-06-14 (×6): qty 1
  Filled 2012-06-14: qty 2
  Filled 2012-06-14: qty 1
  Filled 2012-06-14: qty 2

## 2012-06-14 NOTE — Progress Notes (Addendum)
[facility] PROGRESS NOTE  Marilyne Haseley ZOX:[id number] DOB: Mar 02, 1926 DOA: 06/11/2012 PCP: Ignatius Specking., MD Cardiologist=De Natasha Bence EP=S Graciela Husbands   Brief narrative:  78 year old female with history of Campath induced A. fib controlled on Amiodarone/Metoprolol and on Coumadin, sinus node dysfunction status Medtronic dual chamber PPM 05/24/03, hypertension, dyslipidemia, CAD (Two vessel, quiescent; 70% LAD aretery disease and well collateralized, 100% RCA by cardiac cath in 2004; NL LVF; low risk Cardioloite; EF 74%, 11/10) who was in her usual state of health was brought to the ED 9/15 after she sustained a mechanical fall and fractured her right femur.   Assessment/Plan:  Femur fracture, right  -Day one status post op right hip subtrochanteric fracture with intramedullary nail placed 06/13/2012. Coumadin held and given 10 mg of vitamin K on 9/16. -Orthopedist and-please comment on when safe to resume Coumadin versus aspirin. -Patient is partial weight bearing therapy-I suspect she will need short-term skilled care -Patient needs incentive spirometry every 2 hourly x24 hourly -Appreciate orthopedics recommendations-thank you  Active Problems:  Anemia-multifactorial-Anemia of acute blood loss+ chronic anemia Take iron tablets at home. Transfused this admission intraoperatively 9/17 2 units packed red blood cells-and also transfused 2 units prior to surgery on 9/17 continue ferrous sulfate 325 daily  Hypothyroidism  Stable. Continue Synthroid 75 mcg daily  HYPERLIPIDEMIA-MIXED  -On Crestor at home which I will switch to Zocor 5 mg, patient to continue Zetia at 10 mg although decreased evidence for mortality benefit-deferred to primary care physician  HYPERTENSION, BENIGN  Continue amlodipine 10 mg daily, Imdur 30 mg daily, metoprolol tartrate 25 mg twice a day  CAD, NATIVE VESSEL  Continue statin, beta blocker and Imdur at above doses  ATRIAL FIBRILLATION, PAROXYSMAL  On Coumadin  which is on hold for now. Continue amiodarone 100 po qd-maintaining NSR -supposed to see Dr. Johney Frame for pacemaker replacement per his last office note  Sinoatrial node dysfunction  History of pacemaker-see above narrative-outpt f/u Dr. Adine Madura soon for battery chenge  Elevated blood sugar without diagnosis of diabetes Get A1c.  Hold coverage unless blood sugar above 200 consistently. Check CBG 4 times a day a.c. at bedtime  Thrombocytopenia- She has dropped from admission 210 to 92.  Will monitor with CBC. Get Peripheral  Smear if sustains-hold all heparins  Mild hyponatremia-  ? From ABLA from surgery vs poor po intake-Bmet am  Leukocytosis  Noted or elevated wbc.Will check UA-patient has history of bronchitis therefore we will also get chest x-ray 9/18 a.m.  Code Status: Full Code  Family Communication:  Disposition Plan: SDU   Consultants:  Orthopedics-Dr. August Saucer  Procedures:  Status post right hip replacement and intramedullary nailing 9/17 2030  Antibiotics:  Intraoperative Ancef times one  HPI/Subjective: Nursing reports bout of confusion overnight.  NOt clear on how to use Incentive spirometer She complains that she has had "bronchitis" for the past 3 weeks. She feels somewhat poorly this morning. She denies any chest pain shortness of breath nausea vomiting She says she has pain "all over". She says the pain is worse when she moves her right lower throat he  Objective: Filed Vitals:   06/14/12 0300 06/14/12 0305 06/14/12 0400 06/14/12 0600  BP:  131/51 131/32 136/79  Pulse: 60 60 59 77  Temp:   98.9 F (37.2 C)   TempSrc:   Oral   Resp: 20 22 22 19   Height:      Weight:      SpO2: 97% 95% 91% 94%    Intake/Output Summary (  Last 24 hours) at 06/14/12 0710 Last data filed at 06/14/12 0600  Gross per 24 hour  Intake 5724.5 ml  Output   2290 ml  Net 3434.5 ml   Filed Weights   06/11/12 2017 06/14/12 0123  Weight: 68.04 kg (150 lb) 68.04 kg (150 lb)     Exam:    General: Elderly female lying in bed in no acute distress, no pallor or icterus  HEENT: No pallor, moist oral mucosa, no JVD Cardiovascular: S1 S2 no murmurs rub or gallop  Respiratory: Breaths Sounds bilaterally decreased with some moderate crackles Abdomen: soft ,non tender, non-distended bowel sounds present Foley in place  Extremities: warm, noted postop changes and bandages over right lateral hip and leg-some soaking through of the top bandage over hip CNS: AAOX 3-can tell me date year season hospital-cannot tell me the president   Data Reviewed: Basic Metabolic Panel:  Lab 06/14/12 2841 06/11/12 2205 06/11/12 1416  NA 129* 133* 136  K 4.6 4.5 4.1  CL 97 97 100  CO2 24 30 30   GLUCOSE 168* 222* 120*  BUN 17 31* 26*  CREATININE 0.94 1.03 0.88  CALCIUM 9.0 9.6 9.8  MG -- -- --  PHOS -- -- --   Liver Function Tests: No results found for this basename: AST:5,ALT:5,ALKPHOS:5,BILITOT:5,PROT:5,ALBUMIN:5 in the last 168 hours No results found for this basename: LIPASE:5,AMYLASE:5 in the last 168 hours No results found for this basename: AMMONIA:5 in the last 168 hours CBC:  Lab 06/14/12 0310 06/13/12 2339 06/13/12 0415 06/11/12 2205 06/11/12 1416  WBC 16.2* -- 17.5* 19.0* 16.2*  NEUTROABS -- -- -- -- 12.3*  HGB 10.0* 10.9* 7.2* 10.2* 12.2  HCT 28.7* 31.8* 21.1* 30.1* 36.3  MCV 84.7 -- 89.4 89.3 89.4  PLT 92* -- 130* 178 210   Cardiac Enzymes: No results found for this basename: CKTOTAL:5,CKMB:5,CKMBINDEX:5,TROPONINI:5 in the last 168 hours BNP (last 3 results) No results found for this basename: PROBNP:3 in the last 8760 hours CBG: No results found for this basename: GLUCAP:5 in the last 168 hours  Recent Results (from the past 240 hour(s))  MRSA PCR SCREENING     Status: Normal   Collection Time   06/12/12 12:22 PM      Component Value Range Status Comment   MRSA by PCR NEGATIVE  NEGATIVE Final   SURGICAL PCR SCREEN     Status: Normal   Collection  Time   06/13/12  1:52 PM      Component Value Range Status Comment   MRSA, PCR NEGATIVE  NEGATIVE Final    Staphylococcus aureus NEGATIVE  NEGATIVE Final      Studies: Dg Femur Right  06/13/2012  *RADIOLOGY REPORT*  Clinical Data: Fracture fixation.  DG C-ARM 61-120 MIN - NRPT [facility], RIGHT FEMUR - 2 VIEW  Comparison: Plain films 06/11/2012.  Findings: We are provided with five fluoroscopic spot views of the right hip and femur.  Images demonstrate placement dynamic hip screw and long IM nail for fixation of an intertrochanteric fracture.  Four cerclage wires are present about the proximal femur and a single distal interlocking screw is in place.  Position and alignment appear near anatomic.  No new abnormality.  IMPRESSION: ORIF right femur fracture.   Original Report Authenticated By: Bernadene Bell. Maricela Curet, M.D.    Dg Pelvis Portable  06/13/2012  *RADIOLOGY REPORT*  Clinical Data: Status post fracture fixation.  PORTABLE PELVIS  Comparison: Plain films 06/11/2012.  Findings: The patient has a new dynamic hip screw and  long IM nail for fixation of a proximal femur fracture.  For cerclage wires are identified.  There is no acute fracture.  Both hips are located. Gas in the soft tissues from surgery noted.  IMPRESSION: Status post ORIF of a right femur fracture without evidence of complication.   Original Report Authenticated By: Bernadene Bell. D'ALESSIO, M.D.    Dg C-arm 61-120 Min-no Report  06/13/2012  *RADIOLOGY REPORT*  Clinical Data: Fracture fixation.  DG C-ARM 61-120 MIN - NRPT [facility], RIGHT FEMUR - 2 VIEW  Comparison: Plain films 06/11/2012.  Findings: We are provided with five fluoroscopic spot views of the right hip and femur.  Images demonstrate placement dynamic hip screw and long IM nail for fixation of an intertrochanteric fracture.  Four cerclage wires are present about the proximal femur and a single distal interlocking screw is in place.  Position and alignment appear near anatomic.  No new  abnormality.  IMPRESSION: ORIF right femur fracture.   Original Report Authenticated By: Bernadene Bell. D'ALESSIO, M.D.     Scheduled Meds:    . amiodarone  100 mg Oral Daily  . amLODipine  10 mg Oral Daily  .  ceFAZolin (ANCEF) IV  2 g Intravenous Q8H  . cholecalciferol  1,000 Units Oral Daily  . cyclobenzaprine  5 mg Oral TID  . docusate sodium  100 mg Oral BID  . ezetimibe  10 mg Oral Daily  . ferrous sulfate  325 mg Oral Q breakfast  . isosorbide mononitrate  30 mg Oral Daily  . levothyroxine  75 mcg Oral Q0600  . metoprolol tartrate  25 mg Oral BID  . pantoprazole  40 mg Oral Q1200  . raloxifene  60 mg Oral Daily  . simvastatin  5 mg Oral q1800  . sodium chloride  3 mL Intravenous Q12H  . warfarin  3 mg Oral ONCE-1800  . Warfarin - Pharmacist Dosing Inpatient   Does not apply q1800   Continuous Infusions:    . 0.9 % NaCl with KCl 20 mEq / L 75 mL/hr at 06/14/12 0542  . lactated ringers 125 mL/hr at 06/14/12 0115  . DISCONTD: dextrose 5 % and 0.45% NaCl 100 mL/hr at 06/13/12 1615       Time spent: 30 minutes    Mahala Menghini [facility]  [facility] Pager [phone] If 8PM-8AM, please contact night-coverage at [email or web address], password [facility] 06/14/2012, 7:10 AM  LOS: 3 days

## 2012-06-14 NOTE — Op Note (Signed)
NAME:  [name], [name] NO.:  [id number]  MEDICAL RECORD NO.:  [id number]  LOCATION:  1223                         FACILITY:  [facility]  PHYSICIAN:  Burnard Bunting, M.D.    DATE OF BIRTH:  Jul 07, 1926  DATE OF PROCEDURE:  06/13/2012 DATE OF DISCHARGE:                              OPERATIVE REPORT   PREOPERATIVE DIAGNOSIS:  Right 3 part subtrochanteric femur fracture.  POSTOPERATIVE DIAGNOSIS:  Right 3 part subtrochanteric femur fracture.  PROCEDURE:  Open reduction and internal fixation of the subtrochanteric femur fracture with 4 cables with subsequent intramedullary nailing using Smith and Nephew 10 x 36 IMHS.  SURGEON:  Burnard Bunting, MD  ASSISTANT:  Jerolyn Shin. Tresa Res, M.D.  ANESTHESIA:  General endotracheal.  ESTIMATED BLOOD LOSS:  700 mL.  DRAINS:  None.  INDICATIONS:  Melanie [name] is a patient who is 2 days out from subtrochanteric fracture.  Coumadin and INR was adjusted for surgery. She presents now for operative management after explanation of risks and benefits.  PROCEDURE IN DETAIL:  The patient was brought to the operating room where general endotracheal anesthesia was induced.  Preop antibiotics administered.  The patient was placed on the fracture table with left leg in lithotomy position.  A time-out was called.  Right hip was prescrubbed with alcohol and Betadine, prepped with DuraPrep and draped in a sterile manner with the wall drape.  The fracture was localized. The incision was made laterally.  Skin and subcutaneous tissues were sharply divided.  Fascia lata was divided.  Muscle was divided. Bleeding points encountered and closed with electrocautery.  Fracture was identified and reduced.  Four cables were placed distal to proximal. Care was taken to avoid injury to neurovascular structures.  Once the femur had been reconstructed, proximal incision was made.  A guide pin was placed down the femur to the tip of the trochanter.  Reaming  was performed to 11.5 mm.  A 10 x 36 nail was placed.  IMHS compression screw was then placed across the fracture.  Good reduction was achieved. One distal interlocking screw was placed in the most proximal hole using perfect circles technique.  All incisions were then thoroughly irrigated.  Distal incision was closed using 3-0 nylon suture.  The larger incision was irrigated and closed using #1 Vicryl suture, 0 Vicryl suture, and skin staples.  The proximal incision for the guide pin and nail was placed.  The screw was closed using interrupted inverted 0 Vicryl suture, 2-0 Vicryl suture, and the skin staples.  The patient tolerated the procedure well without immediate complications. Melanie [name]'s assistance was required at all times during the case for opening and closing.  His assistance was a medical necessity.  The patient did receive 2 units of packed red blood cell transfusion during the case.  The patient was transferred to recovery room in stable condition.     Burnard Bunting, M.D.     GSD/MEDQ  D:  06/13/2012  T:  06/14/2012  Job:  [id number]

## 2012-06-14 NOTE — Progress Notes (Signed)
Report given to shae rn, stepdown. Rt hip dsg's have 1 small spot of blooday drg.

## 2012-06-14 NOTE — Progress Notes (Signed)
Paged Dr Mahala Menghini, advised of temp of 101.1.  Received orders for blood cultures x2 & CBC w/ Diff

## 2012-06-14 NOTE — Progress Notes (Signed)
Pt stable vss Decreased pain with rom hip Plan CPM snf

## 2012-06-14 NOTE — Progress Notes (Signed)
CARE MANAGEMENT NOTE 06/14/2012  Patient:  Melanie [name], Melanie [name].   Account Number:  [id number]  Date Initiated:  06/13/2012  Documentation initiated by:  Jiles Crocker  Subjective/Objective Assessment:   ADMITTED WITH RIGHT FEMUR FRACTURE     Action/Plan:   PCP: Ignatius Specking., MD  LIVES AT HOME ALONE; POSSIBLE NEED SHORT TERM SNF AT DISCHARGE; [facility] WORKER REFERRAL PLACED   Anticipated DC Date:  06/17/2012   Anticipated DC Plan:  SKILLED NURSING FACILITY  In-house referral  Clinical Social Worker      DC Planning Services  CM consult      Choice offered to / List presented to:             Status of service:  In process, will continue to follow Medicare Important Message given?  NA - LOS <3 / Initial given by admissions (If response is "NO", the following Medicare IM given date fields will be blank) Date Medicare IM given:   Date Additional Medicare IM given:    Discharge Disposition:    Per UR Regulation:  Reviewed for med. necessity/level of care/duration of stay  If discussed at Long Length of Stay Meetings, dates discussed:    Comments:  [id number] Earlene Plater, RN, BSN, CCM: CHART REVIEWED AND UPDATED. NO DISCHARGE NEEDS PRESENT AT THIS TIME. CASE MANAGEMENT [phone]   06/13/2012- Abelino Derrick RN, BSN, MHA

## 2012-06-14 NOTE — Plan of Care (Signed)
Problem: Phase II Progression Outcomes Goal: Tolerating diet Outcome: Completed/Met Date Met:  06/14/12 Pt advanced from clear liquid to carb modified, tolerating well. Goal: Discharge plan established Outcome: Progressing Pt and family discussed the possibility of being discharged to a SNIF for rehab.

## 2012-06-14 NOTE — Clinical Social Work Note (Signed)
Clinical Social Work Department CLINICAL SOCIAL WORK PLACEMENT NOTE 06/14/2012  Patient:  Melanie [name], Melanie [name].  Account Number:  [id number] Admit date:  06/11/2012  Clinical Social Worker:  Vennie Homans, [place]  Date/time:  06/14/2012 12:00 M  Clinical Social Work is seeking post-discharge placement for this patient at the following level of care:   SKILLED NURSING   (*CSW will update this form in Epic as items are completed)   06/14/2012  Patient/family provided with [facility] Department of Clinical Social Work's list of facilities offering this level of care within the geographic area requested by the patient (or if unable, by the patient's family).  06/14/2012  Patient/family informed of their freedom to choose among providers that offer the needed level of care, that participate in Medicare, Medicaid or managed care program needed by the patient, have an available bed and are willing to accept the patient.  06/14/2012  Patient/family informed of [facility]' ownership interest in [facility], as well as of the fact that they are under no obligation to receive care at this facility.  PASARR submitted to EDS on 06/13/2012 PASARR number received from EDS on 06/13/2012  FL2 transmitted to all facilities in geographic area requested by pt/family on  06/13/2012 FL2 transmitted to all facilities within larger geographic area on   Patient informed that his/her managed care company has contracts with or will negotiate with  certain facilities, including the following:     Patient/family informed of bed offers received:  06/14/2012 Patient chooses bed at  Physician recommends and patient chooses bed at    Patient to be transferred to  on   Patient to be transferred to facility by   The following physician request were entered in Epic:   Additional Comments: Pt leaning towards [facility]  Doreen Salvage, [place] ICU/Stepdown Clinical Social Worker [facility] Cell [phone] Hours 8am-1200pm M-F

## 2012-06-14 NOTE — Progress Notes (Signed)
Foley removed that was inserted 9/15, foley was intact.   New temp foley inserted for the following reasons: Postoperative pain Pt incontinent Protection of breakdown of fragile skin
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
## 2012-06-14 NOTE — Progress Notes (Signed)
ANTICOAGULATION CONSULT NOTE - Initial Consult  Pharmacy Consult for warfarin Indication: atrial fibrillation and VTE prophylaxis  No Known Allergies  Patient Measurements: Height: 5\' 4"  (162.6 cm) Weight: 150 lb (68.04 kg) IBW/kg (Calculated) : 54.7  Heparin Dosing Weight:   Vital Signs: Temp: 99.5 F (37.5 C) (09/18 0123) Temp src: Oral (09/17 1854) BP: 152/52 mmHg (09/18 0123) Pulse Rate: 70  (09/18 0045)  Labs:  Basename 06/13/12 2339 06/13/12 0415 06/12/12 1810 06/12/12 0905 06/11/12 2205 06/11/12 1416  HGB 10.9* 7.2* -- -- -- --  HCT 31.8* 21.1* -- -- 30.1* --  PLT -- 130* -- -- 178 210  APTT -- -- -- -- -- --  LABPROT -- 15.9* 22.5* 24.7* -- --  INR -- 1.24 1.94* 2.19* -- --  HEPARINUNFRC -- -- -- -- -- --  CREATININE -- -- -- -- 1.03 0.88  CKTOTAL -- -- -- -- -- --  CKMB -- -- -- -- -- --  TROPONINI -- -- -- -- -- --    Estimated Creatinine Clearance: 38.5 ml/min (by C-G formula based on Cr of 1.03).   Medical History: Past Medical History  Diagnosis Date  . Vertigo   . Paroxysmal atrial fibrillation     on coumadin and amiodarone  . Sinus node dysfunction     s/p Medtronic dual-chamber pacemaker  . Hypercalcemia   . Hypertension   . Dyslipidemia   . CAD (coronary artery disease)     Two vessel, quiescent; 70% LAD aretery disease and well collateralized, 100% RCA by cardiac cath in 2004; NL LVF; low risk Cardioloite; EF 74%, 11/10  . Hypothyroidism     Medications:  Prescriptions prior to admission  Medication Sig Dispense Refill  . amiodarone (PACERONE) 200 MG tablet Take 100 mg by mouth daily.      Marland Kitchen amLODipine (NORVASC) 10 MG tablet Take 10 mg by mouth daily.      . cholecalciferol (VITAMIN D) 1000 UNITS tablet Take 1,000 Units by mouth daily.        Marland Kitchen ezetimibe (ZETIA) 10 MG tablet Take 10 mg by mouth daily.        . ferrous sulfate 325 (65 FE) MG tablet Take 325 mg by mouth daily with breakfast.      . fish oil-omega-3 fatty acids 1000 MG  capsule Take 1 g by mouth 2 (two) times daily.       . isosorbide mononitrate (IMDUR) 30 MG CR tablet Take 30 mg by mouth. Take 1/2 by mouth daily      . levothyroxine (SYNTHROID, LEVOTHROID) 75 MCG tablet Take 75 mcg by mouth daily.        . metoprolol tartrate (LOPRESSOR) 25 MG tablet Take 25 mg by mouth 2 (two) times daily.       . pantoprazole (PROTONIX) 40 MG tablet Take 40 mg by mouth daily.        . raloxifene (EVISTA) 60 MG tablet Take 60 mg by mouth daily.        . rosuvastatin (CRESTOR) 5 MG tablet Take 5 mg by mouth Nightly.        . warfarin (COUMADIN) 2.5 MG tablet Take 1.25-2.5 mg by mouth.        Assessment: Patient on chronic warfarin for Afib, now post surgery.  INR < 2, pharmacy to restart warfarin.  Goal of Therapy:  INR 2-3    Plan:  Start with Coumadin 3 mg tonight. Check PT/INR daily. Provide Coumadin education.   Aleene Davidson  [place] 06/14/2012,1:33 AM

## 2012-06-15 ENCOUNTER — Inpatient Hospital Stay (HOSPITAL_COMMUNITY): Payer: Medicare Other

## 2012-06-15 LAB — GLUCOSE, CAPILLARY
Glucose-Capillary: 121 mg/dL — ABNORMAL HIGH (ref 70–99)
Glucose-Capillary: 152 mg/dL — ABNORMAL HIGH (ref 70–99)

## 2012-06-15 LAB — CBC
HCT: 22.1 % — ABNORMAL LOW (ref 36.0–46.0)
Platelets: 69 10*3/uL — ABNORMAL LOW (ref 150–400)
Platelets: 82 10*3/uL — ABNORMAL LOW (ref 150–400)
RBC: 2.52 MIL/uL — ABNORMAL LOW (ref 3.87–5.11)
RDW: 15.5 % (ref 11.5–15.5)
WBC: 11.2 10*3/uL — ABNORMAL HIGH (ref 4.0–10.5)
WBC: 11 10*3/uL — ABNORMAL HIGH (ref 4.0–10.5)

## 2012-06-15 LAB — COMPREHENSIVE METABOLIC PANEL
ALT: 16 U/L (ref 0–35)
AST: 32 U/L (ref 0–37)
CO2: 23 mEq/L (ref 19–32)
Calcium: 9.5 mg/dL (ref 8.4–10.5)
Sodium: 124 mEq/L — ABNORMAL LOW (ref 135–145)
Total Protein: 4.7 g/dL — ABNORMAL LOW (ref 6.0–8.3)

## 2012-06-15 LAB — DIFFERENTIAL
Eosinophils Relative: 1 % (ref 0–5)
Monocytes Relative: 14 % — ABNORMAL HIGH (ref 3–12)
Neutrophils Relative %: 71 % (ref 43–77)

## 2012-06-15 LAB — PROTIME-INR: INR: 1.23 (ref 0.00–1.49)

## 2012-06-15 LAB — OCCULT BLOOD X 1 CARD TO LAB, STOOL: Fecal Occult Bld: NEGATIVE

## 2012-06-15 MED ORDER — DEXTROSE 5 % IV SOLN
1.0000 g | INTRAVENOUS | Status: DC
  Administered 2012-06-15: 1 g via INTRAVENOUS
  Filled 2012-06-15 (×3): qty 10

## 2012-06-15 NOTE — Progress Notes (Signed)
Physical Therapy Treatment Patient Details Name: Melanie [name] MRN: [id number] DOB: [birth year] Today's Date: 06/15/2012 Time: 4098-1191 PT Time Calculation (min): 30 min  PT Assessment / Plan / Recommendation Comments on Treatment Session   Pt. Tolerated sitting on edge of bed. Made attempts to stand at [facility] but pt was unable. Pt. Is 10% WB on LLE. Pt did tolerate sitting at edge x 10 minutes w. Supervision.    Follow Up Recommendations    SNF   Barriers to Discharge        Equipment Recommendations    next venue   Recommendations for Other Services  ot  Frequency   3x  Plan   snf   Precautions / Restrictions Restrictions Weight Bearing Restrictions: Yes RLE Weight Bearing: Touchdown weight bearing   Pertinent Vitals/Pain Pain 7/10 L thigh. RN notified and brought medication BP pre 127/59; during 132/52. No dizziness.  HR 70-76; sats > 95% 54l.   Mobility  Bed Mobility Bed Mobility: Rolling Right;Rolling Left;Supine to Sit;Sit to Supine Rolling Right: 1: +2 Total assist (partial role) Rolling Right: Patient Percentage: 10% Rolling Left: 1: +2 Total assist Rolling Left: Patient Percentage: 10% (partial role) Supine to Sit: 1: +2 Total assist Sitting - Scoot to Edge of Bed: Patient Percentage: 10% Sit to Supine: 1: +2 Total assist Sit to Supine: Patient Percentage: 0% Details for Bed Mobility Assistance: Requires assist for LEs and trunk when rolling with pt able to reach across body for therapists hand with cues for technique, assist for B LEs off of bed and for trunk to attain sitting position (pt able to assist somewhat with LLE) with cues for technique/hand placement Transfers Transfers: Sit to Stand;Stand to Sit Details for Transfer Assistance: ATTEMPTS MADE TO STAND AT RW BUT PT. COULD NOT STAND ON RLE ONLY and use UE's for support.     Exercises     PT Diagnosis:    PT Problem List:   PT Treatment Interventions:     PT Goals Acute Rehab PT Goals Pt will go  Supine/Side to Sit: with mod assist PT Goal: Supine/Side to Sit - Progress: Progressing toward goal Pt will Sit at [facility] of Bed: with min assist;with bilateral upper extremity support;6-10 min PT Goal: Sit at Edge Of Bed - Progress: Updated due to goal met Pt will go Sit to Supine/Side: with mod assist PT Goal: Sit to Supine/Side - Progress: Progressing toward goal Pt will go Sit to Stand: with max assist PT Goal: Sit to Stand - Progress: Progressing toward goal Pt will go Stand to Sit: with max assist PT Goal: Stand to Sit - Progress: Progressing toward goal  Visit Information  Last PT Received On: 06/15/12 Assistance Needed: +3 or more (need 3 to raise bed and try pivot)    Subjective Data  Subjective: I am so sleepy. I can't stand.   Cognition  Overall Cognitive Status: Appears within functional limits for tasks assessed/performed Arousal/Alertness: Awake/alert Orientation Level: Appears intact for tasks assessed Behavior During Session: [facility] for tasks performed Cognition - Other Comments: Pt. appears clearer today.    Balance  Static Sitting Balance Static Sitting - Balance Support: Bilateral upper extremity supported;Feet supported Static Sitting - Level of Assistance: 5: Stand by assistance;4: Min assist Static Sitting - Comment/# of Minutes: pt. sat on side of bed x 10 minutes. Pt. was able to sit w/ no support.  End of Session PT - End of Session Equipment Utilized During Treatment: Oxygen Activity Tolerance: Patient limited by pain;Patient  limited by fatigue Patient left: in bed;with call bell/phone within reach Nurse Communication: Mobility status;Need for lift equipment CPM Right Knee CPM Right Knee: Off   GP     [name] 06/15/2012, 4:06 PM [phone]

## 2012-06-15 NOTE — Clinical Social Work Placement (Signed)
Clinical Social Work Department CLINICAL SOCIAL WORK PLACEMENT NOTE 06/15/2012  Patient:  Melanie [name], Melanie [name].  Account Number:  [id number] Admit date:  06/11/2012  Clinical Social Worker:  Vennie Homans, [place]  Date/time:  06/14/2012 12:00 M  Clinical Social Work is seeking post-discharge placement for this patient at the following level of care:   SKILLED NURSING   (*CSW will update this form in Epic as items are completed)   06/14/2012  Patient/family provided with [facility] Department of Clinical Social Work's list of facilities offering this level of care within the geographic area requested by the patient (or if unable, by the patient's family).  06/14/2012  Patient/family informed of their freedom to choose among providers that offer the needed level of care, that participate in Medicare, Medicaid or managed care program needed by the patient, have an available bed and are willing to accept the patient.  06/14/2012  Patient/family informed of [facility]' ownership interest in [facility], as well as of the fact that they are under no obligation to receive care at this facility.  PASARR submitted to EDS on 06/13/2012 PASARR number received from EDS on 06/13/2012  FL2 transmitted to all facilities in geographic area requested by pt/family on  06/13/2012 FL2 transmitted to all facilities within larger geographic area on   Patient informed that his/her managed care company has contracts with or will negotiate with  certain facilities, including the following:     Patient/family informed of bed offers received:  06/14/2012 Patient chooses bed at [facility] Physician recommends and patient chooses bed at    Patient to be transferred to  on   Patient to be transferred to facility by   The following physician request were entered in Epic:   Additional Comments: Pt leaning towards [facility]  CSW LEFT VM FOR SNF ON BED CHOICE  Doreen Salvage,  [place] ICU/Stepdown Clinical Social Worker [facility] Cell [phone] Hours 8am-1200pm M-F

## 2012-06-15 NOTE — Progress Notes (Signed)
[facility] PROGRESS NOTE  Melanie [name] UJW:[id number] DOB: [birth year] DOA: 06/11/2012 PCP: Ignatius Specking., MD Cardiologist=De Natasha Bence EP=S Graciela Husbands   Brief narrative:  78 year old female with history of Campath induced A. fib controlled on Amiodarone/Metoprolol and on Coumadin, sinus node dysfunction status Medtronic dual chamber PPM 05/24/03, hypertension, dyslipidemia, CAD (Two vessel, quiescent; 70% LAD aretery disease and well collateralized, 100% RCA by cardiac cath in 2004; NL LVF; low risk Cardioloite; EF 74%, 11/10) who was in her usual state of health was brought to the ED 9/15 after she sustained a mechanical fall and fractured her right femur.   Assessment/Plan:  Femur fracture, right  -Day #2 status post op right hip subtrochanteric fracture with intramedullary nail placed 06/13/2012. Coumadin held and given 10 mg of vitamin K on 9/16. -From my standpoint would not give any anticoagulation given she continues to drop her hematocrit. We will get a CBC at 1 PM and if this is below 7.0 we will transfuse her 2 more units packed red blood cells-I've taken her permission to do so -He will require thigh-high SCDs for DVT prophylaxis although this is suboptimal compared to heparin -Patient is partial weight bearing therapy-he will need short-term skilled care for physical therapy's note in/18 -Patient needs incentive spirometry every 2 hourly x24 hourly -Appreciate orthopedics recommendations-thank you  Active Problems:  Anemia-multifactorial-Anemia of acute blood loss+ chronic anemia Take iron tablets at home. Transfused this admission intraoperatively 9/17 2 units packed red blood cells-and also transfused 2 units prior to surgery on 9/17 continue ferrous sulfate 325 daily -Please see above -Gauic all stools -Will d/c coumadin for now-SCD's  Fever  patient has had continuous temperatures over 101.0 since 9/18-this coincides with her Having surgery. Clinically she does not have any  Rales and rhonchi and urine culture that was done 9/18 still pending-she is on cefazolin monotherapy day #2. I will await 1 view stat chest x-ray to confirm no other findings and we will continue antibiotics-her white count has dropped from 9/15 and 19.02 level 0.2 today 9/18  Hypothyroidism  Stable. Continue Synthroid 75 mcg daily  HYPERLIPIDEMIA-MIXED  -On Crestor at home which I will switch to Zocor 5 mg, patient to continue Zetia at 10 mg although decreased evidence for mortality benefit-deferred to primary care physician  HYPERTENSION, BENIGN  Continue amlodipine 10 mg daily, Imdur 30 mg daily, metoprolol tartrate 25 mg twice a day-controlled  CAD, NATIVE VESSEL  Continue statin, beta blocker and Imdur at above doses  ATRIAL FIBRILLATION, PAROXYSMAL  On Coumadin which is on hold for now. Continue amiodarone 100 po qd-maintaining NSR -supposed to see Dr. Johney Frame for pacemaker replacement per his last office note  Sinoatrial node dysfunction  History of pacemaker-see above narrative-outpt f/u Dr. Adine Madura soon for battery change  Elevated blood sugar without diagnosis of diabetes Get A1c.  Hold coverage unless blood sugar above 200 consistently. Check CBG 4 times a day a.c. at bedtime  Thrombocytopenia- She has dropped from admission 210 to 92-->69   9/19  Will monitor with CBC. Get Peripheral  Smear if sustains-hold all heparins--Will consult Oncologist if goes below 50.  Suspect total RBC deficit 2/2 to ABLA as per above  Mild hyponatremia-  ? From ABLA from surgery vs poor po intake-Bmet am.  Fluid restrict her to 1200 cc today. Renal function is relatively stable with a good EEG if her therefore I am suspecting that this is from tea toast potomania and will not workup (othe rthan TSH/U OSm) unless her sodium  drops below 120  Leukocytosis  Noted or elevated wbc.Will check UA-patient has history of bronchitis therefore we will also get chest x-ray 9/18 a.m. Rpt CXR 9/19  am  Code Status: Full Code  Family Communication:  Disposition Plan: SDU   Consultants:  Orthopedics-Dr. August Saucer  Procedures:  Status post right hip replacement and intramedullary nailing 9/17 2030  Antibiotics:  Intraoperative Ancef times one  HPI/Subjective: Doing better.  Feels a little bit more herself Worked moderately with therapy-Was at the side of the bed  Objective: Filed Vitals:   06/14/12 1800 06/14/12 2000 06/15/12 0000 06/15/12 0400  BP: 135/47 126/53 142/49 121/41  Pulse: 64 79 80 69  Temp: 100.2 F (37.9 C) 100.8 F (38.2 C) 101.3 F (38.5 C) 100.4 F (38 C)  TempSrc:  Core (Comment) Core (Comment) Core (Comment)  Resp: 22 17 23 21   Height:      Weight:   75.5 kg (166 lb 7.2 oz)   SpO2: 95% 96% 95% 97%    Intake/Output Summary (Last 24 hours) at 06/15/12 0718 Last data filed at 06/15/12 0500  Gross per 24 hour  Intake   1125 ml  Output   1230 ml  Net   -105 ml   Filed Weights   06/11/12 2017 06/14/12 0123 06/15/12 0000  Weight: 68.04 kg (150 lb) 68.04 kg (150 lb) 75.5 kg (166 lb 7.2 oz)    Exam:    General: Elderly female lying in bed in no acute distress, no pallor or icterus  HEENT: No pallor, moist oral mucosa, no JVD Cardiovascular: S1 S2 no murmurs rub or gallop  Respiratory: Breaths Sounds bilaterally decreased -no rales, crackles Abdomen: soft ,non tender, non-distended bowel sounds present Foley in place  Extremities: warm, noted postop changes and bandages over right lateral hip and leg-some soaking through of the top bandage over hip CNS: AAOX 3-can tell me date year season hospital-cannot tell me the president   Data Reviewed: Basic Metabolic Panel:  Lab 06/15/12 1610 06/14/12 0310 06/11/12 2205 06/11/12 1416  NA 124* 129* 133* 136  K 4.3 4.6 4.5 4.1  CL 94* 97 97 100  CO2 23 24 30 30   GLUCOSE 124* 168* 222* 120*  BUN 18 17 31* 26*  CREATININE 1.02 0.94 1.03 0.88  CALCIUM 9.5 9.0 9.6 9.8  MG -- -- -- --  PHOS -- --  -- --   Liver Function Tests:  Lab 06/15/12 0315  AST 32  ALT 16  ALKPHOS 40  BILITOT 0.4  PROT 4.7*  ALBUMIN 1.6*   No results found for this basename: LIPASE:5,AMYLASE:5 in the last 168 hours No results found for this basename: AMMONIA:5 in the last 168 hours CBC:  Lab 06/15/12 0315 06/14/12 1752 06/14/12 1220 06/14/12 0310 06/13/12 2339 06/13/12 0415 06/11/12 1416  WBC 11.2* 14.4* 14.4* 16.2* -- 17.5* --  NEUTROABS -- 11.5* 11.3* -- -- -- 12.3*  HGB 7.7* 8.1* 8.8* 10.0* 10.9* -- --  HCT 22.1* 23.7* 25.4* 28.7* 31.8* -- --  MCV 84.4 84.6 83.8 84.7 -- 89.4 --  PLT 69* 79* 84* 92* -- 130* --   Cardiac Enzymes: No results found for this basename: CKTOTAL:5,CKMB:5,CKMBINDEX:5,TROPONINI:5 in the last 168 hours BNP (last 3 results) No results found for this basename: PROBNP:3 in the last 8760 hours CBG:  Lab 06/14/12 2220 06/14/12 1644 06/14/12 1203 06/14/12 0904  GLUCAP 166* 194* 169* 179*    Recent Results (from the past 240 hour(s))  MRSA PCR SCREENING  Status: Normal   Collection Time   06/12/12 12:22 PM      Component Value Range Status Comment   MRSA by PCR NEGATIVE  NEGATIVE Final   SURGICAL PCR SCREEN     Status: Normal   Collection Time   06/13/12  1:52 PM      Component Value Range Status Comment   MRSA, PCR NEGATIVE  NEGATIVE Final    Staphylococcus aureus NEGATIVE  NEGATIVE Final      Studies: Dg Chest 1 View  06/14/2012  *RADIOLOGY REPORT*  Clinical Data: Bronchitis.  Postoperative fever.  CHEST - 1 VIEW  Comparison: Chest x-ray 05/15/2014 3013.  Findings: The pacer wires are stable.  The heart is normal in size. The mediastinal and hilar contours are stable.  Low lung volumes with vascular crowding and bibasilar atelectasis.  Increased interstitial markings and mild central vascular congestion without overt pulmonary edema or pleural effusion.  IMPRESSION:  1.  Low lung volumes with vascular crowding and atelectasis. 2.  Mild vascular congestion and  bronchitic changes but no edema or effusions.   Original Report Authenticated By: P. Loralie Champagne, M.D.    Dg Femur Right  06/13/2012  *RADIOLOGY REPORT*  Clinical Data: Fracture fixation.  DG C-ARM 61-120 MIN - NRPT [facility], RIGHT FEMUR - 2 VIEW  Comparison: Plain films 06/11/2012.  Findings: We are provided with five fluoroscopic spot views of the right hip and femur.  Images demonstrate placement dynamic hip screw and long IM nail for fixation of an intertrochanteric fracture.  Four cerclage wires are present about the proximal femur and a single distal interlocking screw is in place.  Position and alignment appear near anatomic.  No new abnormality.  IMPRESSION: ORIF right femur fracture.   Original Report Authenticated By: Bernadene Bell. Maricela Curet, M.D.    Dg Pelvis Portable  06/13/2012  *RADIOLOGY REPORT*  Clinical Data: Status post fracture fixation.  PORTABLE PELVIS  Comparison: Plain films 06/11/2012.  Findings: The patient has a new dynamic hip screw and long IM nail for fixation of a proximal femur fracture.  For cerclage wires are identified.  There is no acute fracture.  Both hips are located. Gas in the soft tissues from surgery noted.  IMPRESSION: Status post ORIF of a right femur fracture without evidence of complication.   Original Report Authenticated By: Bernadene Bell. D'ALESSIO, M.D.    Dg C-arm 61-120 Min-no Report  06/13/2012  *RADIOLOGY REPORT*  Clinical Data: Fracture fixation.  DG C-ARM 61-120 MIN - NRPT [facility], RIGHT FEMUR - 2 VIEW  Comparison: Plain films 06/11/2012.  Findings: We are provided with five fluoroscopic spot views of the right hip and femur.  Images demonstrate placement dynamic hip screw and long IM nail for fixation of an intertrochanteric fracture.  Four cerclage wires are present about the proximal femur and a single distal interlocking screw is in place.  Position and alignment appear near anatomic.  No new abnormality.  IMPRESSION: ORIF right femur fracture.   Original  Report Authenticated By: Bernadene Bell. D'ALESSIO, M.D.     Scheduled Meds:    . amiodarone  100 mg Oral Daily  . amLODipine  10 mg Oral Daily  .  ceFAZolin (ANCEF) IV  2 g Intravenous Q8H  . cholecalciferol  1,000 Units Oral Daily  . cyclobenzaprine  5 mg Oral TID  . docusate sodium  100 mg Oral BID  . ezetimibe  10 mg Oral Daily  . ferrous sulfate  325 mg Oral Q breakfast  .  insulin aspart  0-9 Units Subcutaneous TID WC  . isosorbide mononitrate  30 mg Oral Daily  . levothyroxine  75 mcg Oral Q0600  . metoprolol tartrate  25 mg Oral BID  . pantoprazole  40 mg Oral Q1200  . raloxifene  60 mg Oral Daily  . simvastatin  5 mg Oral q1800  . sodium chloride  3 mL Intravenous Q12H  . warfarin  3 mg Oral ONCE-1800  . Warfarin - Pharmacist Dosing Inpatient   Does not apply q1800   Continuous Infusions:    . DISCONTD: 0.9 % NaCl with KCl 20 mEq / L Stopped (06/14/12 1320)  . DISCONTD: lactated ringers 125 mL/hr at 06/14/12 0115       Time spent: 30 minutes    Mahala Menghini [facility]  [facility] Pager [phone] If 8PM-8AM, please contact night-coverage at [email or web address], password [facility] 06/15/2012, 7:18 AM  LOS: 4 days

## 2012-06-16 LAB — COMPREHENSIVE METABOLIC PANEL
ALT: 32 U/L (ref 0–35)
AST: 63 U/L — ABNORMAL HIGH (ref 0–37)
Alkaline Phosphatase: 80 U/L (ref 39–117)
CO2: 24 mEq/L (ref 19–32)
Calcium: 10 mg/dL (ref 8.4–10.5)
GFR calc Af Amer: 64 mL/min — ABNORMAL LOW (ref >90)
Glucose, Bld: 112 mg/dL — ABNORMAL HIGH (ref 70–99)
Potassium: 4.3 mEq/L (ref 3.5–5.1)
Sodium: 126 mEq/L — ABNORMAL LOW (ref 135–145)
Total Protein: 5.2 g/dL — ABNORMAL LOW (ref 6.0–8.3)

## 2012-06-16 LAB — URINE CULTURE: Colony Count: NO GROWTH

## 2012-06-16 LAB — DIC (DISSEMINATED INTRAVASCULAR COAGULATION)PANEL
D-Dimer, Quant: 3.98 ug/mL-FEU — ABNORMAL HIGH (ref 0.00–0.48)
INR: 1.15 (ref 0.00–1.49)
Platelets: 101 10*3/uL — ABNORMAL LOW (ref 150–400)
Smear Review: NONE SEEN
aPTT: 35 seconds (ref 24–37)

## 2012-06-16 LAB — GLUCOSE, CAPILLARY
Glucose-Capillary: 113 mg/dL — ABNORMAL HIGH (ref 70–99)
Glucose-Capillary: 138 mg/dL — ABNORMAL HIGH (ref 70–99)

## 2012-06-16 LAB — CBC
HCT: 19.8 % — ABNORMAL LOW (ref 36.0–46.0)
MCHC: 34.3 g/dL (ref 30.0–36.0)
MCV: 85 fL (ref 78.0–100.0)
RDW: 15.5 % (ref 11.5–15.5)
WBC: 11 10*3/uL — ABNORMAL HIGH (ref 4.0–10.5)

## 2012-06-16 LAB — HEMOGLOBIN AND HEMATOCRIT, BLOOD
HCT: 25.6 % — ABNORMAL LOW (ref 36.0–46.0)
Hemoglobin: 8.7 g/dL — ABNORMAL LOW (ref 12.0–15.0)

## 2012-06-16 LAB — TSH: TSH: 0.298 u[IU]/mL — ABNORMAL LOW (ref 0.350–4.500)

## 2012-06-16 LAB — LACTATE DEHYDROGENASE: LDH: 205 U/L (ref 94–250)

## 2012-06-16 LAB — OSMOLALITY, URINE: Osmolality, Ur: 372 mOsm/kg — ABNORMAL LOW (ref 390–1090)

## 2012-06-16 MED ORDER — FUROSEMIDE 10 MG/ML IJ SOLN
20.0000 mg | Freq: Once | INTRAMUSCULAR | Status: AC
  Administered 2012-06-16: 20 mg via INTRAVENOUS
  Filled 2012-06-16: qty 2

## 2012-06-16 MED ORDER — PIPERACILLIN-TAZOBACTAM 3.375 G IVPB
3.3750 g | Freq: Three times a day (TID) | INTRAVENOUS | Status: DC
  Administered 2012-06-16 – 2012-06-17 (×3): 3.375 g via INTRAVENOUS
  Filled 2012-06-16 (×3): qty 50

## 2012-06-16 MED ORDER — VANCOMYCIN HCL 1000 MG IV SOLR
750.0000 mg | Freq: Two times a day (BID) | INTRAVENOUS | Status: DC
  Administered 2012-06-16 – 2012-06-17 (×2): 750 mg via INTRAVENOUS
  Filled 2012-06-16 (×3): qty 750

## 2012-06-16 MED ORDER — DIPHENHYDRAMINE HCL 50 MG/ML IJ SOLN
12.5000 mg | Freq: Once | INTRAMUSCULAR | Status: AC
  Administered 2012-06-16: 12.5 mg via INTRAVENOUS
  Filled 2012-06-16: qty 1

## 2012-06-16 MED ORDER — ACETAMINOPHEN 325 MG PO TABS
650.0000 mg | ORAL_TABLET | Freq: Once | ORAL | Status: AC
  Administered 2012-06-16: 650 mg via ORAL

## 2012-06-16 NOTE — Progress Notes (Signed)
Physical Therapy Treatment Patient Details Name: Melanie [name] MRN: [id number] DOB: 02/17/1926 Today's Date: 06/16/2012 Time: 3267-1245 PT Time Calculation (min): 30 min  PT Assessment / Plan / Recommendation Comments on Treatment Session  Pt. tolerated sitting at edge of bed. Pt. still is not ready for attempts to stand. Pt. tolerated lift to recliner w/ RLE supported. Pt. was not dizzy when sitting at edge.    Follow Up Recommendations  Skilled nursing facility    Barriers to Discharge        Equipment Recommendations  None recommended by PT    Recommendations for Other Services    Frequency Min 3X/week   Plan Discharge plan remains appropriate;Frequency remains appropriate    Precautions / Restrictions Precautions Precautions: Fall Precaution Comments: support R leg to prevent too much knee flexion Restrictions Weight Bearing Restrictions: Yes RLE Weight Bearing: Touchdown weight bearing Other Position/Activity Restrictions: 10%   Pertinent Vitals/Pain Pt. On 4l sats do drop to 88%. Seems when pt holds breath at time of pain.    Mobility  Bed Mobility Supine to Sit: 1: +2 Total assist Supine to Sit: Patient Percentage: 0% Sitting - Scoot to Edge of Bed: 1: +2 Total assist Sitting - Scoot to Edge of Bed: Patient Percentage: 10% Details for Bed Mobility Assistance: Pt required total A for LEs and trunk 2* pain. Transfers Transfer via Scientist, water quality    Exercises     PT Diagnosis:    PT Problem List:   PT Treatment Interventions:     PT Goals Acute Rehab PT Goals Pt will go Supine/Side to Sit: with mod assist PT Goal: Supine/Side to Sit - Progress: Progressing toward goal Pt will Sit at [facility] of Bed: with supervision;with no upper extremity support PT Goal: Sit at [facility] Of Bed - Progress: Updated due to goal met Pt will Transfer Bed to Chair/Chair to Bed: with +2 total assist PT Transfer Goal: Bed to Chair/Chair to Bed - Progress: Not  progressing  Visit Information  Last PT Received On: 06/16/12 Assistance Needed: +2    Subjective Data  Subjective: I still feel so sleepy   Cognition  Overall Cognitive Status: Appears within functional limits for tasks assessed/performed Arousal/Alertness: Awake/alert Orientation Level: Appears intact for tasks assessed Behavior During Session: [facility] for tasks performed    Balance  Static Sitting Balance Static Sitting - Balance Support: Bilateral upper extremity supported;Feet supported Static Sitting - Level of Assistance: 5: Stand by assistance Static Sitting - Comment/# of Minutes: pt. sat on side of bed x 10 minutes, Has to slowly lower RLE down to foor due to pain.  End of Session PT - End of Session Equipment Utilized During Treatment: Oxygen Activity Tolerance: Patient tolerated treatment well (Pt stated most pain is getting to sitting ) Nurse Communication: Need for lift equipment CPM Right Knee CPM Right Knee: Off   GP     [name] 06/16/2012, 12:35 PM [phone]

## 2012-06-16 NOTE — Progress Notes (Signed)
Pt stable - sitting in chair Decreased pain with rom right leg hgb low Tolerating cpm Change dressing today snf next week tdwb for transfers rle

## 2012-06-16 NOTE — Clinical Social Work Note (Signed)
CSW following for SNF. Pt's sister went to [facility]  to fill out paperwork. They cannot accept admission on a Sunday. Pt appears she will not be ready for d/c for tomorrow.   CSW to cont. To follow for SNF.  Doreen Salvage, LCSWA ICU/Stepdown Clinical Social Worker [facility] Cell [phone] Hours 8am-1200pm M-F

## 2012-06-16 NOTE — Progress Notes (Signed)
ANTIBIOTIC CONSULT NOTE - INITIAL  Pharmacy Consult for Vanco and Zosyn Indication: Empiric (Persistently Febrile)  No Known Allergies  Patient Measurements: Height: 5\' 4"  (162.6 cm) Weight: 166 lb 7.2 oz (75.5 kg) IBW/kg (Calculated) : 54.7   Vital Signs: Temp: 100.9 [name] (38.3 C) (09/20 1000) Temp src: Core (Comment) (09/20 1000) BP: 139/45 mmHg (09/20 1000) Pulse Rate: 88  (09/20 1000) Intake/Output from previous day: 09/19 0701 - 09/20 0700 In: 1030 [P.O.:980; IV Piggyback:50] Out: 1306 [Urine:1305; Stool:1] Intake/Output from this shift:    Labs:  Basename 06/16/12 0841 06/16/12 0320 06/15/12 1310 06/15/12 0315 06/14/12 0310  WBC -- 11.0* 11.0* 11.2* --  HGB -- 6.9* 7.4* 7.7* --  PLT 101* 87* 82* -- --  LABCREA -- -- -- -- --  CREATININE 0.93 -- -- 1.02 0.94   Estimated Creatinine Clearance: 44.8 ml/min (by C-G formula based on Cr of 0.93). No results found for this basename: VANCOTROUGH:2,VANCOPEAK:2,VANCORANDOM:2,GENTTROUGH:2,GENTPEAK:2,GENTRANDOM:2,TOBRATROUGH:2,TOBRAPEAK:2,TOBRARND:2,AMIKACINPEAK:2,AMIKACINTROU:2,AMIKACIN:2, in the last 72 hours   Microbiology: Recent Results (from the past 720 hour(s))  MRSA PCR SCREENING     Status: Normal   Collection Time   06/12/12 12:22 PM      Component Value Range Status Comment   MRSA by PCR NEGATIVE  NEGATIVE Final   SURGICAL PCR SCREEN     Status: Normal   Collection Time   06/13/12  1:52 PM      Component Value Range Status Comment   MRSA, PCR NEGATIVE  NEGATIVE Final    Staphylococcus aureus NEGATIVE  NEGATIVE Final   URINE CULTURE     Status: Normal   Collection Time   06/14/12 10:45 AM      Component Value Range Status Comment   Specimen Description URINE, CATHETERIZED   Final    Special Requests NONE   Final    Culture  Setup Time 06/15/2012 01:35   Final    Colony Count NO GROWTH   Final    Culture NO GROWTH   Final    Report Status 06/16/2012 FINAL   Final   CULTURE, BLOOD (ROUTINE X 2)     Status:  Normal (Preliminary result)   Collection Time   06/14/12 12:15 PM      Component Value Range Status Comment   Specimen Description BLOOD LEFT ARM   Final    Special Requests BOTTLES DRAWN AEROBIC ONLY 4CC   Final    Culture  Setup Time 06/14/2012 20:41   Final    Culture     Final    Value:        BLOOD CULTURE RECEIVED NO GROWTH TO DATE CULTURE WILL BE HELD FOR 5 DAYS BEFORE ISSUING A FINAL NEGATIVE REPORT   Report Status PENDING   Incomplete   CULTURE, BLOOD (ROUTINE X 2)     Status: Normal (Preliminary result)   Collection Time   06/14/12 12:20 PM      Component Value Range Status Comment   Specimen Description BLOOD RIGHT HAND   Final    Special Requests BOTTLES DRAWN AEROBIC ONLY 3CC   Final    Culture  Setup Time 06/14/2012 20:41   Final    Culture     Final    Value:        BLOOD CULTURE RECEIVED NO GROWTH TO DATE CULTURE WILL BE HELD FOR 5 DAYS BEFORE ISSUING A FINAL NEGATIVE REPORT   Report Status PENDING   Incomplete     Medical History: Past Medical History  Diagnosis Date  .  Vertigo   . Paroxysmal atrial fibrillation     on coumadin and amiodarone  . Sinus node dysfunction     s/p Medtronic dual-chamber pacemaker  . Hypercalcemia   . Hypertension   . Dyslipidemia   . CAD (coronary artery disease)     Two vessel, quiescent; 70% LAD aretery disease and well collateralized, 100% RCA by cardiac cath in 2004; NL LVF; low risk Cardioloite; EF 74%, 11/10  . Hypothyroidism     Medications:  Anti-infectives     Start     Dose/Rate Route Frequency Ordered Stop   06/16/12 1400  piperacillin-tazobactam (ZOSYN) IVPB 3.375 g       3.375 g 12.5 mL/hr over 240 Minutes Intravenous 3 times per day 06/16/12 1319     06/16/12 1400   vancomycin (VANCOCIN) 750 mg in sodium chloride 0.9 % 150 mL IVPB        750 mg 150 mL/hr over 60 Minutes Intravenous Every 12 hours 06/16/12 1319     06/15/12 1400   cefTRIAXone (ROCEPHIN) 1 g in dextrose 5 % 50 mL IVPB  Status:  Discontinued         1 g 100 mL/hr over 30 Minutes Intravenous Every 24 hours 06/15/12 1209 06/16/12 1228   06/14/12 0400   ceFAZolin (ANCEF) IVPB 2 g/50 mL premix        2 g 100 mL/hr over 30 Minutes Intravenous 3 times per day 06/14/12 0126 06/14/12 2210         Assessment: 78 yo [name] admitted 9/14 with R femur fracture, s/p IM intertrochanteric nail on 9/17. Plan now is to broaden abx coverage due to persistent fevers on ceftriaxone.  Goal of Therapy:  Vancomycin trough level 15-20 mcg/ml  Plan:  1) Zosyn 3.375g IV q8h 2) Vanco 750mg  IV q12h  Darrol Angel, PharmD Pager: [phone] 06/16/2012,1:20 PM

## 2012-06-16 NOTE — Progress Notes (Signed)
[facility] PROGRESS NOTE  Melanie [name] ZOX:[id number] DOB: 1926/08/07 DOA: 06/11/2012 PCP: Ignatius Specking., MD Cardiologist=De Natasha Bence EP=S Graciela Husbands   Brief narrative:  77 year old female with history of Campath induced A. fib controlled on Amiodarone/Metoprolol and on Coumadin, sinus node dysfunction status Medtronic dual chamber PPM 05/24/03, hypertension, dyslipidemia, CAD (Two vessel, quiescent; 70% LAD artery disease and well collateralized, 100% RCA by cardiac cath in 2004; NL LVF; low risk Cardioloite; EF 74%, 11/10) who was in her usual state of health was brought to the ED 9/15 after she sustained a mechanical fall and fractured her right femur.   Assessment/Plan:  Femur fracture, right  -Day #3 status post op right hip subtrochanteric fracture with intramedullary nail placed 06/13/2012. Coumadin held and given 10 mg of vitamin K on 9/16. -From my standpoint would not give any anticoagulation given she continues to drop her hematocrit. Her hemoglobin has trended steadily downwards to 6.9 this morning and I will transfuse 2 units packed red blood cells  -He will require thigh-high SCDs for DVT prophylaxis although this is suboptimal compared to heparin -Patient is partial weight bearing therapy-he will need short-term skilled care for physical therapy's note in/18 -Patient needs incentive spirometry every 2 hourly x24 hourly -Appreciate orthopedics recommendations-thank you  Active Problems:  Anemia-multifactorial-Anemia of acute blood loss+ chronic anemia Take iron tablets at home. Transfused this admission intraoperatively 9/17 2 units packed red blood cells-and also transfused 2 units prior to surgery on 9/17 continue ferrous sulfate 325 daily -Please see above-consulted hematology Dr. Gaylyn Rong 9/20 to determine-although it is unlikely we will get an LDH, cemented, a haptoglobin, a DIC panel and workup from there. -Gauic all stools -Will d/c coumadin for now-SCD's  Fever  patient has  had continuous temperatures over 101.0 since 9/18-this coincides with her Having surgery. Clinically she does not have any Rales and rhonchi and urine culture that was done 9/18 still pending-she is on cefazolin monotherapy day #3. I will await 1 view stat chest x-ray to confirm no other findings and we will continue antibiotics-her white count has dropped  Nursing reports that the temperatures taken were from the urine catheter and when they take temperatures otherwise, she is afebrile. I will hold on broadening spectrum of antibiotics at present If she spikes a temperature above 101.4, I will pan culture once again and placed on broad-spectrum vancomycin and Zosyn With her hematological abnormalities-I. would be very cautious and followup workup as per hematology reconsult if needed  Hypothyroidism  Stable. Continue Synthroid 75 mcg daily  HYPERLIPIDEMIA-MIXED  -On Crestor at home which I will switch to Zocor 5 mg, patient to continue Zetia at 10 mg although decreased evidence for mortality benefit-deferred to primary care physician  HYPERTENSION, BENIGN  -Controlled -Continue amlodipine 10 mg daily, Imdur 30 mg daily, metoprolol tartrate 25 mg twice a day-controlled  CAD, NATIVE VESSEL  -Continue statin, beta blocker and Imdur at above doses  ATRIAL FIBRILLATION, PAROXYSMAL  -On Coumadin which is on hold for now. Continue amiodarone 100 po qd-maintaining NSR -supposed to see Dr. Johney Frame for pacemaker replacement per his last office note  Sinoatrial node dysfunction  -History of pacemaker-see above narrative-outpt f/u Dr. Adine Madura soon for battery change  Elevated blood sugar without diagnosis of diabetes -A1c 5.6.  Hold coverage unless blood sugar above 200 consistently. Check CBG 4 times a day a.c. at bedtime  Thrombocytopenia- -She has dropped from admission 210 to 92-->69 -her platelets have rebounded to 87 9/20 Suspect total RBC deficit 2/2 to  ABLA as per above-she lost 700  mils during surgery We will get a reticulocyte count in one or 2 days  Mild hyponatremia-  -? From ABLA from surgery vs poor po intake-Bmet am.  Fluid restrict her to 1200 cc today. Renal function is relatively stable with a good EEG if her therefore I am suspecting that this is from tea toast potomania and will not workup (othe rthan TSH/U OSm) unless her sodium drops below 120  Leukocytosis  N-oted or elevated wbc.Will check UA-patient has history of bronchitis therefore we will also get chest x-ray 9/18 a.m. Rpt CXR 9/19 am  Code Status: Full Code  Family Communication:  Disposition Plan: SDU   Consultants:  Orthopedics-Dr. August Saucer  Hematology-Telephone consulted Dr. Gaylyn Rong 9/20  Procedures:  Status post right hip replacement and intramedullary nailing 9/17 2030  Antibiotics:  Ancef 9.17.13>>>  HPI/Subjective:   Objective: Filed Vitals:   06/16/12 0000 06/16/12 0400 06/16/12 0516 06/16/12 0600  BP: 141/47 133/51  135/42  Pulse: 81 83  88  Temp: 100.6 F (38.1 C) 100.6 F (38.1 C) 98.9 F (37.2 C) 100.9 F (38.3 C)  TempSrc: Core (Comment) Core (Comment) Oral Core (Comment)  Resp: 22 21  23   Height:      Weight:      SpO2: 92% 94%  93%    Intake/Output Summary (Last 24 hours) at 06/16/12 0865 Last data filed at 06/16/12 0400  Gross per 24 hour  Intake   1030 ml  Output   1306 ml  Net   -276 ml   Filed Weights   06/11/12 2017 06/14/12 0123 06/15/12 0000  Weight: 68.04 kg (150 lb) 68.04 kg (150 lb) 75.5 kg (166 lb 7.2 oz)    Exam:  General: Elderly female lying in bed in no acute distress, no pallor or icterus  HEENT: No pallor, moist oral mucosa, no JVD Cardiovascular: S1 S2 no murmurs rub or gallop  Respiratory: Breaths Sounds bilaterally decreased -no rales, crackles Abdomen: soft ,non tender, non-distended bowel sounds present Foley in place  Extremities: warm, noted postop changes and bandages over right lateral hip and leg-wound examined today noted  hematomas over the lateral aspect of the right leg with some mild tenderness however her wound site and incision very clean with no purulence or dehiscent CNS: AAOX 3-can tell me date year season hospital-cannot tell me the president   Data Reviewed: Basic Metabolic Panel:  Lab 06/15/12 7846 06/14/12 0310 06/11/12 2205 06/11/12 1416  NA 124* 129* 133* 136  K 4.3 4.6 4.5 4.1  CL 94* 97 97 100  CO2 23 24 30 30   GLUCOSE 124* 168* 222* 120*  BUN 18 17 31* 26*  CREATININE 1.02 0.94 1.03 0.88  CALCIUM 9.5 9.0 9.6 9.8  MG -- -- -- --  PHOS -- -- -- --   Liver Function Tests:  Lab 06/15/12 0315  AST 32  ALT 16  ALKPHOS 40  BILITOT 0.4  PROT 4.7*  ALBUMIN 1.6*   No results found for this basename: LIPASE:5,AMYLASE:5 in the last 168 hours No results found for this basename: AMMONIA:5 in the last 168 hours CBC:  Lab 06/16/12 0320 06/15/12 1310 06/15/12 0315 06/14/12 1752 06/14/12 1220 06/11/12 1416  WBC 11.0* 11.0* 11.2* 14.4* 14.4* --  NEUTROABS -- -- 7.9* 11.5* 11.3* 12.3*  HGB 6.9* 7.4* 7.7* 8.1* 8.8* --  HCT 19.8* 21.4* 22.1* 23.7* 25.4* --  MCV 85.0 84.9 84.4 84.6 83.8 --  PLT 87* 82* 69* 79* 84* --  Cardiac Enzymes: No results found for this basename: CKTOTAL:5,CKMB:5,CKMBINDEX:5,TROPONINI:5 in the last 168 hours BNP (last 3 results) No results found for this basename: PROBNP:3 in the last 8760 hours CBG:  Lab 06/15/12 2107 06/15/12 1617 06/15/12 1135 06/15/12 0743 06/14/12 2220  GLUCAP 127* 152* 153* 121* 166*    Recent Results (from the past 240 hour(s))  MRSA PCR SCREENING     Status: Normal   Collection Time   06/12/12 12:22 PM      Component Value Range Status Comment   MRSA by PCR NEGATIVE  NEGATIVE Final   SURGICAL PCR SCREEN     Status: Normal   Collection Time   06/13/12  1:52 PM      Component Value Range Status Comment   MRSA, PCR NEGATIVE  NEGATIVE Final    Staphylococcus aureus NEGATIVE  NEGATIVE Final   URINE CULTURE     Status: Normal    Collection Time   06/14/12 10:45 AM      Component Value Range Status Comment   Specimen Description URINE, CATHETERIZED   Final    Special Requests NONE   Final    Culture  Setup Time 06/15/2012 01:35   Final    Colony Count NO GROWTH   Final    Culture NO GROWTH   Final    Report Status 06/16/2012 FINAL   Final   CULTURE, BLOOD (ROUTINE X 2)     Status: Normal (Preliminary result)   Collection Time   06/14/12 12:15 PM      Component Value Range Status Comment   Specimen Description BLOOD LEFT ARM   Final    Special Requests BOTTLES DRAWN AEROBIC ONLY 4CC   Final    Culture  Setup Time 06/14/2012 20:41   Final    Culture     Final    Value:        BLOOD CULTURE RECEIVED NO GROWTH TO DATE CULTURE WILL BE HELD FOR 5 DAYS BEFORE ISSUING A FINAL NEGATIVE REPORT   Report Status PENDING   Incomplete   CULTURE, BLOOD (ROUTINE X 2)     Status: Normal (Preliminary result)   Collection Time   06/14/12 12:20 PM      Component Value Range Status Comment   Specimen Description BLOOD RIGHT HAND   Final    Special Requests BOTTLES DRAWN AEROBIC ONLY 3CC   Final    Culture  Setup Time 06/14/2012 20:41   Final    Culture     Final    Value:        BLOOD CULTURE RECEIVED NO GROWTH TO DATE CULTURE WILL BE HELD FOR 5 DAYS BEFORE ISSUING A FINAL NEGATIVE REPORT   Report Status PENDING   Incomplete      Studies: Dg Chest 1 View  06/15/2012  *RADIOLOGY REPORT*  Clinical Data: Pneumonia  CHEST - 1 VIEW  Comparison: 06/14/2012  Findings: Heart size upper normal.  Central vascular fullness has decreased slightly in the interval. Hypoaeration with interstitial and vascular crowding. Mild peribronchial thickening , decreased in the interval.  Mild bibasilar opacities.  Right paratracheal fullness is similar to priors dating back to 2009, likely vasculature.  No pneumothorax.  No definite pleural effusion.  Left chest wall battery pack with unchanged lead tip positioning. Aortic atherosclerotic calcification.   IMPRESSION: Mild bibasilar opacities are without significant interval change. Favor atelectasis.  Decreased vascular congestion and bronchitic change.   Original Report Authenticated By: Waneta Martins, M.D.  Scheduled Meds:    . amiodarone  100 mg Oral Daily  . amLODipine  10 mg Oral Daily  . cefTRIAXone (ROCEPHIN)  IV  1 g Intravenous Q24H  . cholecalciferol  1,000 Units Oral Daily  . cyclobenzaprine  5 mg Oral TID  . docusate sodium  100 mg Oral BID  . ferrous sulfate  325 mg Oral Q breakfast  . insulin aspart  0-9 Units Subcutaneous TID WC  . isosorbide mononitrate  30 mg Oral Daily  . levothyroxine  75 mcg Oral Q0600  . metoprolol tartrate  25 mg Oral BID  . pantoprazole  40 mg Oral Q1200  . raloxifene  60 mg Oral Daily  . sodium chloride  3 mL Intravenous Q12H  . DISCONTD: ezetimibe  10 mg Oral Daily  . DISCONTD: simvastatin  5 mg Oral q1800  . DISCONTD: Warfarin - Pharmacist Dosing Inpatient   Does not apply q1800   Continuous Infusions:       Time spent: 30 minutes    Mahala Menghini [facility]  [facility] Pager [phone] If 8PM-8AM, please contact night-coverage at [email or web address], password [facility] 06/16/2012, 8:11 AM  LOS: 5 days

## 2012-06-16 NOTE — Progress Notes (Signed)
OT Cancellation Note  Treatment cancelled today due to medical issues with patient which prohibited therapy. Pt's hgb 6.9. Pt only performing ~10% of mobility with PT. Will check back when appropriate for OT.  Cashay Manganelli A OTR/L [phone] 06/16/2012, 8:16 AM

## 2012-06-16 NOTE — Progress Notes (Addendum)
CRITICAL VALUE ALERT  Critical value received:  Hgb 6.9  Date of notification:  06/16/12  Time of notification:  0415 Critical value read back:yes  Nurse who received alert:  J.Shaquel Josephson RN  MD notified (1st page):  Dr. Ophelia Charter  Time of first page:  [phone]  MD notified (2nd page):  Time of second page:  Responding MD:  Dr. Ophelia Charter, no orders received  Time MD responded:  [phone]

## 2012-06-17 ENCOUNTER — Inpatient Hospital Stay (HOSPITAL_COMMUNITY): Payer: Medicare Other

## 2012-06-17 LAB — CBC
Hemoglobin: 8.5 g/dL — ABNORMAL LOW (ref 12.0–15.0)
MCH: 28.4 pg (ref 26.0–34.0)
MCV: 83.3 fL (ref 78.0–100.0)
RBC: 2.99 MIL/uL — ABNORMAL LOW (ref 3.87–5.11)

## 2012-06-17 LAB — BASIC METABOLIC PANEL
CO2: 25 mEq/L (ref 19–32)
Glucose, Bld: 111 mg/dL — ABNORMAL HIGH (ref 70–99)
Potassium: 3.9 mEq/L (ref 3.5–5.1)
Sodium: 127 mEq/L — ABNORMAL LOW (ref 135–145)

## 2012-06-17 LAB — GLUCOSE, CAPILLARY
Glucose-Capillary: 120 mg/dL — ABNORMAL HIGH (ref 70–99)
Glucose-Capillary: 139 mg/dL — ABNORMAL HIGH (ref 70–99)
Glucose-Capillary: 131 mg/dL — ABNORMAL HIGH (ref 70–99)

## 2012-06-17 LAB — TYPE AND SCREEN: Unit division: 0

## 2012-06-17 LAB — RETICULOCYTES: Retic Ct Pct: 2.9 % (ref 0.4–3.1)

## 2012-06-17 MED ORDER — METOPROLOL TARTRATE 12.5 MG HALF TABLET
12.5000 mg | ORAL_TABLET | Freq: Two times a day (BID) | ORAL | Status: DC
  Administered 2012-06-17 – 2012-06-20 (×6): 12.5 mg via ORAL
  Filled 2012-06-17 (×7): qty 1

## 2012-06-17 MED ORDER — XENON XE 133 GAS
20.0000 | GAS_FOR_INHALATION | Freq: Once | RESPIRATORY_TRACT | Status: AC | PRN
  Administered 2012-06-17: 20 via RESPIRATORY_TRACT

## 2012-06-17 MED ORDER — SODIUM CHLORIDE 0.9 % IV SOLN
INTRAVENOUS | Status: DC
  Administered 2012-06-17 – 2012-06-18 (×2): via INTRAVENOUS

## 2012-06-17 MED ORDER — TECHNETIUM TO 99M ALBUMIN AGGREGATED
3.0000 | Freq: Once | INTRAVENOUS | Status: AC | PRN
  Administered 2012-06-17: 3 via INTRAVENOUS

## 2012-06-17 NOTE — Progress Notes (Signed)
Physical Therapy Treatment Patient Details Name: Melanie [name] MRN: [id number] DOB: 1925/11/01 Today's Date: 06/17/2012 Time: 3086-5784 PT Time Calculation (min): 25 min  PT Assessment / Plan / Recommendation Comments on Treatment Session   VQ low probability for PE.Pt has pain during transition but declined any medication. Pt. continues to have decreased ability to attempt to stand due to TDWB status and weakness. Pt.     Follow Up Recommendations  Skilled nursing facility    Barriers to Discharge        Equipment Recommendations  None recommended by PT    Recommendations for Other Services    Frequency Min 3X/week   Plan Discharge plan remains appropriate;Frequency remains appropriate    Precautions / Restrictions Precautions Precautions: Fall Precaution Comments: support R leg to prevent too much knee flexion  Restrictions RLE Weight Bearing: Touchdown weight bearing   Pertinent Vitals/Pain C/o R thigh when leg moved but declined medication.    Mobility  Bed Mobility Supine to Sit: 1: +2 Total assist Supine to Sit: Patient Percentage: 10% Sitting - Scoot to Edge of Bed: 1: +2 Total assist Sitting - Scoot to Edge of Bed: Patient Percentage: 10% Details for Bed Mobility Assistance: R Leg must be supported to prevent quick knee flexion.    Exercises Other Exercises Other Exercises: attempted knee flexion  on R while sitting on edge but too painful.   PT Diagnosis:    PT Problem List:   PT Treatment Interventions:     PT Goals Acute Rehab PT Goals Pt will go Supine/Side to Sit: with mod assist PT Goal: Supine/Side to Sit - Progress: Not progressing Pt will Sit at [facility] of Bed: with supervision PT Goal: Sit at [facility] Of Bed - Progress: Progressing toward goal Pt will go Sit to Stand: with max assist PT Goal: Sit to Stand - Progress: Not progressing Pt will Transfer Bed to Chair/Chair to Bed: with +2 total assist PT Transfer Goal: Bed to Chair/Chair to Bed -  Progress: Not progressing  Visit Information  Last PT Received On: 06/17/12 Assistance Needed: +2 (may take 3 to try to stand at  TDWB status.)    Subjective Data  Subjective: I  know it will hurt.   Cognition  Overall Cognitive Status: Appears within functional limits for tasks assessed/performed Arousal/Alertness: Lethargic Orientation Level: Appears intact for tasks assessed Behavior During Session: Anxious    Balance  Static Sitting Balance Static Sitting - Balance Support: Left upper extremity supported;Feet supported Static Sitting - Level of Assistance: 5: Stand by assistance Static Sitting - Comment/# of Minutes: x5 minutes  End of Session PT - End of Session Equipment Utilized During Treatment: Oxygen Activity Tolerance: Patient limited by pain Patient left: in chair;with call bell/phone within reach Nurse Communication: Mobility status;Need for lift equipment   GP     Melanie [name] 06/17/2012, 3:47 PM

## 2012-06-17 NOTE — Progress Notes (Signed)
Subjective: 4 Days Post-Op Procedure(s) (LRB): INTRAMEDULLARY (IM) NAIL INTERTROCHANTERIC (Right) Patient reports pain as mild.    Objective: Vital signs in last 24 hours: Temp:  [100.6 F (38.1 C)-101.3 F (38.5 C)] 101.3 F (38.5 C) (09/21 1000) Pulse Rate:  [71-84] 73  (09/21 1000) Resp:  [18-26] 20  (09/21 1137) BP: (112-136)/(43-71) 136/52 mmHg (09/21 1000) SpO2:  [90 %-96 %] 90 % (09/21 1137) Weight:  [91.8 kg (202 lb 6.1 oz)] 91.8 kg (202 lb 6.1 oz) (09/21 0500)  Intake/Output from previous day: 09/20 0701 - 09/21 0700 In: 939 [I.V.:3; Blood:626; IV Piggyback:200] Out: 2850 [Urine:2850] Intake/Output this shift: Total I/O In: -  Out: 300 [Urine:300]   Basename 06/17/12 0335 06/16/12 2129 06/16/12 0320 06/15/12 1310 06/15/12 0315  HGB 8.5* 8.7* 6.9* 7.4* 7.7*    Basename 06/17/12 0335 06/16/12 2129 06/16/12 0841 06/16/12 0320  WBC 10.0 -- -- 11.0*  RBC 2.99* -- -- 2.33*  HCT 24.9* 25.6* -- --  PLT 105* -- 101* --    Basename 06/17/12 0335 06/16/12 0841  NA 127* 126*  K 3.9 4.3  CL 95* 94*  CO2 25 24  BUN 24* 19  CREATININE 1.13* 0.93  GLUCOSE 111* 112*  CALCIUM 9.5 10.0    Basename 06/17/12 0335 06/16/12 0841  LABPT -- --  INR 1.11 1.15    Neurologically intact  Thigh swollen , serosang drainage from incision and some weeping from staple sites.    No cellulitis. No purulence expressed.   Assessment/Plan: 4 Days Post-Op Procedure(s) (LRB): INTRAMEDULLARY (IM) NAIL INTERTROCHANTERIC (Right) Up with therapy    D/C  IV  Antibiotics. Compressive dressing applied.   VQ scan neg.   Fever likely from thigh hematoma from severe fracture with comminution and extension down the shaft more than a normal IT hip fracture which required more dissection and cables etc.  Continue IS work with triflow. Discussed with pt and sister.   Sung Parodi C 06/17/2012, 11:38 AM

## 2012-06-17 NOTE — Progress Notes (Signed)
[facility] PROGRESS NOTE  Cierria Height FAO:[id number] DOB: 09-16-1926 DOA: 06/11/2012 PCP: Ignatius Specking., MD Cardiologist=De Natasha Bence EP=S Graciela Husbands   Brief narrative:  78 year old female with history of Campath induced A. fib controlled on Amiodarone/Metoprolol and on Coumadin, sinus node dysfunction status Med[employer] dual chamber PPM 05/24/03, hypertension, dyslipidemia, CAD (Two vessel, quiescent; 70% LAD artery disease and well collateralized, 100% RCA by cardiac cath in 2004; NL LVF; low risk Cardioloite; EF 74%, 11/10) who was in her usual state of health was brought to the ED 9/15 after she sustained a mechanical fall and fractured her right femur.  Patient has been persistently febrile and has had multiple episodes of low-grade fevers and was initially treated for what was thought to be urinary tract infection until 06/16/2012 and then her antibiotics were transitioned after a spike of temperature to vancomycin and Zosyn for broader coverage.  Assessment/Plan:  1. Femur fracture, right  -Day #4 status post op right hip subtrochanteric fracture with intramedullary nail placed 06/13/2012. Coumadin held and given 10 mg of vitamin K on 9/16. -From my standpoint would not give any anticoagulation given she continues to drop her hematocrit. Her hemoglobin has trended steadily downwards to 6.9 and 9/20 am and was transfused again to Hb 8.5 9/21 am -Her wound appears to have some serous sanguinous drainage and I discussed this morning with Dr. Ophelia Charter (ortho) who is aware of the same and will reassess patient-appreciate assistance in advance -He will require thigh-high SCDs for DVT prophylaxis although this is suboptimal compared to heparin -Patient is partial weight bearing therapy-he will need short-term skilled care for physical therapy's note in/18 -Patient needs incentive spirometry every 2 hourly x24 hourly -Appreciate orthopedics recommendations-thank you  Active Problems:   1. Anemia-multifactorial-Anemia of acute blood loss+ chronic anemia Take iron tablets at home. Transfused this admission intraoperatively 9/17 2 units packed red blood cells-and also transfused 2 units prior to surgery on 9/17 continue ferrous sulfate 325 daily-transfused again 06/16/2012 -Please see above-consulted hematology Dr. Gaylyn Rong 9/20 to determine-patient's d-dimer and fibrinogen were grossly altered, suggesting that she might have either sepsis versus primary pulmonary process such as a DVT or PE-I am inclined to think PE is less likely at present as clinically she is not tachycardic, has minimal respiratory symptoms and has no CP--I will however get a non-urgent V/Q scan to completely rule this out later today 9/21 -Gauic all stools -Careful re-implementation of Xarelto after review at bedside by Ortho?  1. Fever + /-Early SIRS  -Patient has had continuous temperatures over 101.0 since 9/18-this coincides with her having surgery. Clinically she does not have any Rales and rhonchi and urine culture that was done 9/18 negative-she is on cefazolin monotherapy day #3-changed last pm 9/20 to Vancomycin and Zosyn  spiked a temperature above 101 9/20, I will pan culture once again and placed on broad-spectrum vancomycin and Zosyn With her hematological abnormalities-I would be very cautious and followup workup as per hematology reconsult if needed  1. Hypothyroidism  -Stable. Continue Synthroid 75 mcg daily  HYPERLIPIDEMIA-MIXED  -On Crestor at home which I will switch to Zocor 5 mg, patient to continue Zetia at 10 mg although decreased evidence for mortality benefit-deferred to primary care physician  HYPERTENSION, BENIGN  -Controlled -Continue amlodipine 10 mg daily, Imdur 30 mg daily, metoprolol tartrate 25 mg twice a day-controlled  CAD, NATIVE VESSEL  -Continue statin, beta blocker and Imdur at above doses  ATRIAL FIBRILLATION, PAROXYSMAL  -Coumadin held until HB recovers-Continue  amiodarone 100  po qd-maintaining NSR -supposed to see Dr. Johney Frame for pacemaker replacement per his last office note  Sinoatrial node dysfunction  -History of pacemaker-see above narrative-outpt f/u Dr. Adine Madura soon for battery change  Elevated blood sugar without diagnosis of diabetes -A1c 5.6.  Hold coverage unless blood sugar above 200 consistently. Check CBG 4 times a day a.c. at bedtime  Thrombocytopenia- -She has dropped from admission 210 to 92-->69 -her platelets have rebounded to 107 9/21 am Suspect total RBC deficit 2/2 to ABLA as per above-she lost 700 mils during surgery Reticulocyte count reassuring @2 .9 ,9.21  Mild hyponatremia-  -? From ABLA from surgery vs poor po intake-Bmet am.  Fluid restrict her to 1200 cc 9/20. Renal function declined.  Will give increased IVF-Could be 2/2 to potential sepsis?  Leukocytosis  Noted  elevated wbc.see above  Elevated liver enzymes Rpt in the am-AST was 63 on 9/20  Code Status: Full Code  Family Communication: I updated sister yesterday 9/20 and will do the same again later today once seen by orthopedics and once VQ scan was done Disposition Plan: SDU   Consultants:  Orthopedics-Dr. August Saucer  Hematology-Telephone consulted Dr. Gaylyn Rong 9/20  Procedures:  Status post right hip replacement and intramedullary nailing 06/13/2012  Chest x-ray 1 view 9/18 = low lung volumes with vascular crowding and atelectasis, mild congestion and bronchitic changes no edema or effusions  Chest x-ray 1 view 9/19 = mild bibasilar opacities without significant interval change, fever atelectasis crease vascular congestion bronchitic change  Antibiotics:  Ancef 9.17.13>>>9/20  Vanc and Zosyn 9/20>>>>  Cultures all neg so far  HPI/Subjective:  Feels fair.  Persistently febrile.  NO n/v/sob/cp Nursing reports drainage and the need to re-inforce dressing over R lateral thigh. She is very tender over that area  Objective: Filed Vitals:    06/16/12 2300 06/17/12 0000 06/17/12 0400 06/17/12 0500  BP: 130/44 129/45 127/43   Pulse: 80 80 77   Temp:  101.1 F (38.4 C) 100.6 F (38.1 C)   TempSrc:      Resp:  24 21   Height:      Weight:    91.8 kg (202 lb 6.1 oz)  SpO2:  92% 94%     Intake/Output Summary (Last 24 hours) at 06/17/12 4098 Last data filed at 06/17/12 0600  Gross per 24 hour  Intake    939 ml  Output   2850 ml  Net  -1911 ml   Filed Weights   06/14/12 0123 06/15/12 0000 06/17/12 0500  Weight: 68.04 kg (150 lb) 75.5 kg (166 lb 7.2 oz) 91.8 kg (202 lb 6.1 oz)    Exam:  General: Elderly female lying in bed in no acute distress, no pallor or icterus  HEENT: No pallor, moist oral mucosa, no JVD Cardiovascular: S1 S2 no murmurs rub or gallop  Respiratory: Breaths Sounds bilaterally decreased -no rales, crackles Abdomen: soft ,non tender, non-distended bowel sounds present Foley in place  Extremities: warm, noted postop changes and bandages over right lateral hip and leg-wound examined today noted hematomas  over the lateral aspect of the right leg with some mild tenderness--Purulence not see, but noted by nursing who had to re-inforce the dressing this mornin 9/21 CNS: AAOX 3-can tell me date year season hospital-cannot tell me the president   Data Reviewed: Basic Metabolic Panel:  Lab 06/17/12 1191 06/16/12 0841 06/15/12 0315 06/14/12 0310 06/11/12 2205  NA 127* 126* 124* 129* 133*  K 3.9 4.3 4.3 4.6 4.5  CL 95* 94*  94* 97 97  CO2 25 24 23 24 30   GLUCOSE 111* 112* 124* 168* 222*  BUN 24* 19 18 17  31*  CREATININE 1.13* 0.93 1.02 0.94 1.03  CALCIUM 9.5 10.0 9.5 9.0 9.6  MG -- -- -- -- --  PHOS -- -- -- -- --   Liver Function Tests:  Lab 06/16/12 0841 06/15/12 0315  AST 63* 32  ALT 32 16  ALKPHOS 80 40  BILITOT 0.6 0.4  PROT 5.2* 4.7*  ALBUMIN 1.6* 1.6*   No results found for this basename: LIPASE:5,AMYLASE:5 in the last 168 hours No results found for this basename: AMMONIA:5 in the last  168 hours CBC:  Lab 06/17/12 0335 06/16/12 2129 06/16/12 0841 06/16/12 0320 06/15/12 1310 06/15/12 0315 06/14/12 1752 06/14/12 1220 06/11/12 1416  WBC 10.0 -- -- 11.0* 11.0* 11.2* 14.4* -- --  NEUTROABS -- -- -- -- -- 7.9* 11.5* 11.3* 12.3*  HGB 8.5* 8.7* -- 6.9* 7.4* 7.7* -- -- --  HCT 24.9* 25.6* -- 19.8* 21.4* 22.1* -- -- --  MCV 83.3 -- -- 85.0 84.9 84.4 84.6 -- --  PLT 105* -- 101* 87* 82* 69* -- -- --   Cardiac Enzymes: No results found for this basename: CKTOTAL:5,CKMB:5,CKMBINDEX:5,TROPONINI:5 in the last 168 hours BNP (last 3 results) No results found for this basename: PROBNP:3 in the last 8760 hours CBG:  Lab 06/16/12 2129 06/16/12 1627 06/16/12 1223 06/16/12 0756 06/15/12 2107  GLUCAP 168* 138* 152* 113* 127*    Recent Results (from the past 240 hour(s))  MRSA PCR SCREENING     Status: Normal   Collection Time   06/12/12 12:22 PM      Component Value Range Status Comment   MRSA by PCR NEGATIVE  NEGATIVE Final   SURGICAL PCR SCREEN     Status: Normal   Collection Time   06/13/12  1:52 PM      Component Value Range Status Comment   MRSA, PCR NEGATIVE  NEGATIVE Final    Staphylococcus aureus NEGATIVE  NEGATIVE Final   URINE CULTURE     Status: Normal   Collection Time   06/14/12 10:45 AM      Component Value Range Status Comment   Specimen Description URINE, CATHETERIZED   Final    Special Requests NONE   Final    Culture  Setup Time 06/15/2012 01:35   Final    Colony Count NO GROWTH   Final    Culture NO GROWTH   Final    Report Status 06/16/2012 FINAL   Final   CULTURE, BLOOD (ROUTINE X 2)     Status: Normal (Preliminary result)   Collection Time   06/14/12 12:15 PM      Component Value Range Status Comment   Specimen Description BLOOD LEFT ARM   Final    Special Requests BOTTLES DRAWN AEROBIC ONLY 4CC   Final    Culture  Setup Time 06/14/2012 20:41   Final    Culture     Final    Value:        BLOOD CULTURE RECEIVED NO GROWTH TO DATE CULTURE WILL BE HELD FOR  5 DAYS BEFORE ISSUING A FINAL NEGATIVE REPORT   Report Status PENDING   Incomplete   CULTURE, BLOOD (ROUTINE X 2)     Status: Normal (Preliminary result)   Collection Time   06/14/12 12:20 PM      Component Value Range Status Comment   Specimen Description BLOOD RIGHT HAND   Final  Special Requests BOTTLES DRAWN AEROBIC ONLY 3CC   Final    Culture  Setup Time 06/14/2012 20:41   Final    Culture     Final    Value:        BLOOD CULTURE RECEIVED NO GROWTH TO DATE CULTURE WILL BE HELD FOR 5 DAYS BEFORE ISSUING A FINAL NEGATIVE REPORT   Report Status PENDING   Incomplete      Studies: Dg Chest 1 View  06/15/2012  *RADIOLOGY REPORT*  Clinical Data: Pneumonia  CHEST - 1 VIEW  Comparison: 06/14/2012  Findings: Heart size upper normal.  Central vascular fullness has decreased slightly in the interval. Hypoaeration with interstitial and vascular crowding. Mild peribronchial thickening , decreased in the interval.  Mild bibasilar opacities.  Right paratracheal fullness is similar to priors dating back to 2009, likely vasculature.  No pneumothorax.  No definite pleural effusion.  Left chest wall battery pack with unchanged lead tip positioning. Aortic atherosclerotic calcification.  IMPRESSION: Mild bibasilar opacities are without significant interval change. Favor atelectasis.  Decreased vascular congestion and bronchitic change.   Original Report Authenticated By: Waneta Martins, M.D.     Scheduled Meds:    . acetaminophen  650 mg Oral Once  . amiodarone  100 mg Oral Daily  . amLODipine  10 mg Oral Daily  . cholecalciferol  1,000 Units Oral Daily  . diphenhydrAMINE  12.5 mg Intravenous Once  . docusate sodium  100 mg Oral BID  . ferrous sulfate  325 mg Oral Q breakfast  . furosemide  20 mg Intravenous Once  . furosemide  20 mg Intravenous Once  . insulin aspart  0-9 Units Subcutaneous TID WC  . isosorbide mononitrate  30 mg Oral Daily  . levothyroxine  75 mcg Oral Q0600  . metoprolol  tartrate  25 mg Oral BID  . pantoprazole  40 mg Oral Q1200  . piperacillin-tazobactam (ZOSYN)  IV  3.375 g Intravenous Q8H  . raloxifene  60 mg Oral Daily  . sodium chloride  3 mL Intravenous Q12H  . vancomycin  750 mg Intravenous Q12H  . DISCONTD: cefTRIAXone (ROCEPHIN)  IV  1 g Intravenous Q24H  . DISCONTD: cyclobenzaprine  5 mg Oral TID   Continuous Infusions:       Time spent: 30 minutes    Mahala Menghini [facility]  [facility] Pager [phone] If 8PM-8AM, please contact night-coverage at [email or web address], password [facility] 06/17/2012, 7:33 AM  LOS: 6 days

## 2012-06-18 LAB — GLUCOSE, CAPILLARY

## 2012-06-18 LAB — BASIC METABOLIC PANEL
BUN: 18 mg/dL (ref 6–23)
CO2: 25 mEq/L (ref 19–32)
Chloride: 100 mEq/L (ref 96–112)
Creatinine, Ser: 0.8 mg/dL (ref 0.50–1.10)
GFR calc Af Amer: 76 mL/min — ABNORMAL LOW (ref >90)
Potassium: 3.9 mEq/L (ref 3.5–5.1)

## 2012-06-18 LAB — CBC WITH DIFFERENTIAL/PLATELET
Basophils Absolute: 0 10*3/uL (ref 0.0–0.1)
Basophils Relative: 1 % (ref 0–1)
Eosinophils Absolute: 0.1 10*3/uL (ref 0.0–0.7)
Eosinophils Relative: 2 % (ref 0–5)
HCT: 25.4 % — ABNORMAL LOW (ref 36.0–46.0)
MCH: 28.3 pg (ref 26.0–34.0)
MCHC: 33.5 g/dL (ref 30.0–36.0)
Monocytes Absolute: 0.9 10*3/uL (ref 0.1–1.0)
Neutro Abs: 6 10*3/uL (ref 1.7–7.7)
RDW: 16.9 % — ABNORMAL HIGH (ref 11.5–15.5)

## 2012-06-18 LAB — COMPREHENSIVE METABOLIC PANEL
AST: 42 U/L — ABNORMAL HIGH (ref 0–37)
Albumin: 1.6 g/dL — ABNORMAL LOW (ref 3.5–5.2)
BUN: 21 mg/dL (ref 6–23)
Calcium: 9.4 mg/dL (ref 8.4–10.5)
Chloride: 100 mEq/L (ref 96–112)
Creatinine, Ser: 0.82 mg/dL (ref 0.50–1.10)
Total Protein: 5.1 g/dL — ABNORMAL LOW (ref 6.0–8.3)

## 2012-06-18 LAB — PROTIME-INR: INR: 1.04 (ref 0.00–1.49)

## 2012-06-18 NOTE — Progress Notes (Signed)
Subjective: 5 Days Post-Op Procedure(s) (LRB): INTRAMEDULLARY (IM) NAIL INTERTROCHANTERIC (Right) Patient reports pain as MILD    Objective: Vital signs in last 24 hours: Temp:  [98.2 F (36.8 C)-101.7 F (38.7 C)] 98.2 F (36.8 C) (09/22 0800) Pulse Rate:  [72-87] 76  (09/22 0738) Resp:  [20-27] 22  (09/22 1125) BP: (112-147)/(46-96) 147/47 mmHg (09/22 0738) SpO2:  [88 %-94 %] 94 % (09/22 1125)  Intake/Output from previous day: 09/21 0701 - 09/22 0700 In: 1595 [P.O.:320; I.V.:1275] Out: 2550 [Urine:2550] Intake/Output this shift: Total I/O In: 545 [P.O.:320; I.V.:225] Out: 250 [Urine:250]   Basename 06/18/12 0338 06/17/12 0335 06/16/12 2129 06/16/12 0320 06/15/12 1310  HGB 8.5* 8.5* 8.7* 6.9* 7.4*    Basename 06/18/12 0338 06/17/12 0335  WBC 8.1 10.0  RBC 3.00* 2.99*  HCT 25.4* 24.9*  PLT 120* 105*    Basename 06/18/12 0338 06/17/12 0335  NA 132* 127*  K 3.7 3.9  CL 100 95*  CO2 24 25  BUN 21 24*  CREATININE 0.82 1.13*  GLUCOSE 108* 111*  CALCIUM 9.4 9.5    Basename 06/18/12 0338 06/17/12 0335  LABPT -- --  INR 1.04 1.11    Neurologically intact  Assessment/Plan: 5 Days Post-Op Procedure(s) (LRB): INTRAMEDULLARY (IM) NAIL INTERTROCHANTERIC (Right) TEMP DOWN, OFF ANTIBIOTICS. WBC DOWN NOW 8.1 WAS 10 TO 11 .    HIP NO EVIDENCE OF POST OP INFECTION. CONTINUE THERAPY AND MOBILIZATION AND INCENTIVE SPIROMETRY YATES,MARK C 06/18/2012, 12:27 PM

## 2012-06-18 NOTE — Progress Notes (Signed)
[facility] PROGRESS NOTE  Melanie [name] OZH:[id number] DOB: [birth year] DOA: 06/11/2012 PCP: Ignatius Specking., MD Cardiologist=De Natasha Bence EP=S Graciela Husbands   Brief narrative:  78 year old female with history of Campath induced A. fib controlled on Amiodarone/Metoprolol and on Coumadin, sinus node dysfunction status Med[employer]onic dual chamber PPM 05/24/03, hypertension, dyslipidemia, CAD (Two vessel, quiescent; 70% LAD artery disease and well collateralized, 100% RCA by cardiac cath in 2004; NL LVF; low risk Cardioloite; EF 74%, 11/10) who was in her usual state of health was brought to the ED 9/15 after she sustained a mechanical fall and fractured her right femur.  Patient has been persistently febrile and has had multiple episodes of low-grade fevers and was initially treated for what was thought to be urinary tract infection until 06/16/2012 and then her antibiotics were transitioned after a spike of temperature to vancomycin and Zosyn for broader coverage. SHe was reviewed by Orthopedics on 9/21 and thought to have potential retained blood in R hip replacement which was likely etiology for fevers and has been d/c off of Abx 9/21.  She will need to ambulate c therapy and potentially can be transferred to [facility] floor if she remains stable  Assessment/Plan:   1. Femur fracture, right  -Day #5 status post op right hip subtrochanteric fracture with intramedullary nail placed 06/13/2012. Coumadin held and given 10 mg of vitamin K on 9/16. -From my standpoint would not give any anticoagulation given she continues to drop her hematocrit. Her hemoglobin has trended steadily downwards to 6.9 and 9/20 am and was transfused again to Hb 8.5 9/21 am and has stabilized there -Her wound appeared some serous sanguinous drainage Dr. Ophelia Charter kindly reviewed this and dressed it with recommendations 9/21 -He will require thigh-high SCDs for DVT prophylaxis although this is suboptimal compared to heparin -Patient is partial  weight bearing therapy-she will need short-term skilled care for physical therapy's note in/18 -Patient needs incentive spirometry every 2 hourly x24 hourly -Careful re-implementation of Xarelto/coumadin after review at bedside by Ortho? When should we attempt this? thnx  Active Problems:  1. Anemia-multifactorial-Anemia of acute blood loss+ chronic anemia Take iron tablets at home. Transfused this admission intraoperatively 9/17 2 units packed red blood cells-and also transfused 2 units prior to surgery on 9/17 continue ferrous sulfate 325 daily-transfused again 06/16/2012 -Please see above-consulted hematology Dr. Gaylyn Rong 9/20 to determine-patient's d-dimer and fibrinogen were grossly altered, suggesting that she might have either sepsis versus primary pulmonary process such as a DVT or PE-I am inclined to think PE is less likely at present as clinically she is not tachycardic, has minimal respiratory symptoms and has no CP--V/Q scan 9/21 was neg  Fever + /-Early SIRS  -Patient has had continuous temperatures over 101.0 since 9/18-this coincides with her having surgery. Work-up neg.  thought by Dr. Ophelia Charter 9/21 to be 2/2 to blood collection in R thigh.  Abx d/c 9/21  Hypothyroidism  -Stable. Continue Synthroid 75 mcg daily  HYPERLIPIDEMIA-MIXED  -On Crestor at home which I will switch to Zocor 5 mg, patient to continue Zetia at 10 mg although decreased evidence for mortality benefit-deferred to primary care physician  HYPERTENSION, BENIGN  -Controlled -Continue amlodipine 10 mg daily, Imdur 30 mg daily, metoprolol tartrate 25 mg twice a day-controlled  CAD, NATIVE VESSEL  -Continue statin, beta blocker and Imdur at above doses  ATRIAL FIBRILLATION, PAROXYSMAL  -Coumadin held until HB recovers-Continue amiodarone 100 po qd-maintaining NSR-supposed to see Dr. Johney Frame for pacemaker replacement per his last office note  Sinoatrial  node dysfunction  -History of pacemaker-see above narrative-outpt  f/u Dr. Adine Madura soon for battery change  Elevated blood sugar without diagnosis of diabetes -A1c 5.6.  Hold coverage unless blood sugar above 200 consistently. Check CBG 4 times a day a.c. at bedtime  Thrombocytopenia- -She has dropped from admission 210 to 92-->69 -her platelets have rebounded to 107 9/21 am Suspect total RBC deficit 2/2 to ABLA as per above-she lost 700 mils during surgery Reticulocyte count reassuring @2 .9 ,9/21  Mild hyponatremia-resolving -? From ABLA from surgery vs poor po intake-Bmet am.  Renal function declined, and has subsequently resolved slowlt  Leukocytosis -resolved  Elevated liver enzymes Rpt in the am-AST was 63 on 9/20  Code Status: Full Code  Family Communication: Bedside updated patient/family 9/21 am c Orthopedics Disposition Plan: SDU   Consultants:  Orthopedics-Dr. August Saucer  Hematology-Telephone consulted Dr. Gaylyn Rong 9/20  Procedures:  Status post right hip replacement and intramedullary nailing 06/13/2012  Chest x-ray 1 view 9/18 = low lung volumes with vascular crowding and atelectasis, mild congestion and bronchitic changes no edema or effusions  Chest x-ray 1 view 9/19 = mild bibasilar opacities without significant interval change, fever atelectasis crease vascular congestion bronchitic change  Antibiotics:  Ancef 9.17.13>>>9/20  Vanc and Zosyn 9/20>>>>9/21  Cultures all neg so far  HPI/Subjective:  Much brighter appearing this am.  Minimal pain Tolerated standing this am Nursing reports drainage and the need to re-inforce dressing over R lateral thigh.  Objective: Filed Vitals:   06/18/12 0400 06/18/12 0700 06/18/12 0738 06/18/12 0800  BP: 136/49  147/47   Pulse: 73  76   Temp: 100.6 F (38.1 C)  100.2 F (37.9 C) 98.2 F (36.8 C)  TempSrc: Core (Comment) Core (Comment) Core (Comment) Oral  Resp: 24  22   Height:      Weight:      SpO2: 92%  93%     Intake/Output Summary (Last 24 hours) at 06/18/12 1120 Last  data filed at 06/18/12 1000  Gross per 24 hour  Intake   2140 ml  Output   2500 ml  Net   -360 ml   Filed Weights   06/14/12 0123 06/15/12 0000 06/17/12 0500  Weight: 68.04 kg (150 lb) 75.5 kg (166 lb 7.2 oz) 91.8 kg (202 lb 6.1 oz)    Exam:  General: Elderly female lying in bed in no acute distress, no pallor or icterus  HEENT: No pallor, moist oral mucosa, no JVD Cardiovascular: S1 S2 no murmurs rub or gallop  Respiratory: Breaths Sounds bilaterally decreased -no rales, crackles Abdomen: soft ,non tender, non-distended bowel sounds present Foley in place  Extremities:bandages over right lateral hip and leg CNS: AAOX 3-can tell me date year season hospital-cannot tell me the president   Data Reviewed: Basic Metabolic Panel:  Lab 06/18/12 1914 06/17/12 0335 06/16/12 0841 06/15/12 0315 06/14/12 0310  NA 132* 127* 126* 124* 129*  K 3.7 3.9 4.3 4.3 4.6  CL 100 95* 94* 94* 97  CO2 24 25 24 23 24   GLUCOSE 108* 111* 112* 124* 168*  BUN 21 24* 19 18 17   CREATININE 0.82 1.13* 0.93 1.02 0.94  CALCIUM 9.4 9.5 10.0 9.5 9.0  MG -- -- -- -- --  PHOS -- -- -- -- --   Liver Function Tests:  Lab 06/18/12 0338 06/16/12 0841 06/15/12 0315  AST 42* 63* 32  ALT 36* 32 16  ALKPHOS 92 80 40  BILITOT 0.8 0.6 0.4  PROT 5.1* 5.2* 4.7*  ALBUMIN 1.6* 1.6* 1.6*   No results found for this basename: LIPASE:5,AMYLASE:5 in the last 168 hours No results found for this basename: AMMONIA:5 in the last 168 hours CBC:  Lab 06/18/12 0338 06/17/12 0335 06/16/12 2129 06/16/12 0841 06/16/12 0320 06/15/12 1310 06/15/12 0315 06/14/12 1752 06/14/12 1220 06/11/12 1416  WBC 8.1 10.0 -- -- 11.0* 11.0* 11.2* -- -- --  NEUTROABS 6.0 -- -- -- -- -- 7.9* 11.5* 11.3* 12.3*  HGB 8.5* 8.5* 8.7* -- 6.9* 7.4* -- -- -- --  HCT 25.4* 24.9* 25.6* -- 19.8* 21.4* -- -- -- --  MCV 84.7 83.3 -- -- 85.0 84.9 84.4 -- -- --  PLT 120* 105* -- 101* 87* 82* -- -- -- --   Cardiac Enzymes: No results found for this basename:  CKTOTAL:5,CKMB:5,CKMBINDEX:5,TROPONINI:5 in the last 168 hours BNP (last 3 results) No results found for this basename: PROBNP:3 in the last 8760 hours CBG:  Lab 06/18/12 0804 06/17/12 2244 06/17/12 1619 06/17/12 1217 06/17/12 0814  GLUCAP 106* 131* 139* 120* 118*    Recent Results (from the past 240 hour(s))  MRSA PCR SCREENING     Status: Normal   Collection Time   06/12/12 12:22 PM      Component Value Range Status Comment   MRSA by PCR NEGATIVE  NEGATIVE Final   SURGICAL PCR SCREEN     Status: Normal   Collection Time   06/13/12  1:52 PM      Component Value Range Status Comment   MRSA, PCR NEGATIVE  NEGATIVE Final    Staphylococcus aureus NEGATIVE  NEGATIVE Final   URINE CULTURE     Status: Normal   Collection Time   06/14/12 10:45 AM      Component Value Range Status Comment   Specimen Description URINE, CATHETERIZED   Final    Special Requests NONE   Final    Culture  Setup Time 06/15/2012 01:35   Final    Colony Count NO GROWTH   Final    Culture NO GROWTH   Final    Report Status 06/16/2012 FINAL   Final   CULTURE, BLOOD (ROUTINE X 2)     Status: Normal (Preliminary result)   Collection Time   06/14/12 12:15 PM      Component Value Range Status Comment   Specimen Description BLOOD LEFT ARM   Final    Special Requests BOTTLES DRAWN AEROBIC ONLY 4CC   Final    Culture  Setup Time 06/14/2012 20:41   Final    Culture     Final    Value:        BLOOD CULTURE RECEIVED NO GROWTH TO DATE CULTURE WILL BE HELD FOR 5 DAYS BEFORE ISSUING A FINAL NEGATIVE REPORT   Report Status PENDING   Incomplete   CULTURE, BLOOD (ROUTINE X 2)     Status: Normal (Preliminary result)   Collection Time   06/14/12 12:20 PM      Component Value Range Status Comment   Specimen Description BLOOD RIGHT HAND   Final    Special Requests BOTTLES DRAWN AEROBIC ONLY 3CC   Final    Culture  Setup Time 06/14/2012 20:41   Final    Culture     Final    Value:        BLOOD CULTURE RECEIVED NO GROWTH TO DATE  CULTURE WILL BE HELD FOR 5 DAYS BEFORE ISSUING A FINAL NEGATIVE REPORT   Report Status PENDING   Incomplete   CULTURE,  BLOOD (ROUTINE X 2)     Status: Normal (Preliminary result)   Collection Time   06/16/12  1:30 PM      Component Value Range Status Comment   Specimen Description BLOOD RIGHT ARM   Final    Special Requests BOTTLES DRAWN AEROBIC AND ANAEROBIC 10CC   Final    Culture  Setup Time 06/16/2012 21:50   Final    Culture     Final    Value:        BLOOD CULTURE RECEIVED NO GROWTH TO DATE CULTURE WILL BE HELD FOR 5 DAYS BEFORE ISSUING A FINAL NEGATIVE REPORT   Report Status PENDING   Incomplete   CULTURE, BLOOD (ROUTINE X 2)     Status: Normal (Preliminary result)   Collection Time   06/16/12  1:45 PM      Component Value Range Status Comment   Specimen Description BLOOD RIGHT HAND   Final    Special Requests BOTTLES DRAWN AEROBIC AND ANAEROBIC 10CC   Final    Culture  Setup Time 06/16/2012 21:49   Final    Culture     Final    Value:        BLOOD CULTURE RECEIVED NO GROWTH TO DATE CULTURE WILL BE HELD FOR 5 DAYS BEFORE ISSUING A FINAL NEGATIVE REPORT   Report Status PENDING   Incomplete      Studies: Nm Pulmonary Perf And Vent  06/17/2012  *RADIOLOGY REPORT*  Clinical Data:  Multiple risk factors for pulmonary embolism including atrial fibrillation, orthopedic surgery, evaluate for pulmonary embolism  NUCLEAR MEDICINE VENTILATION - PERFUSION LUNG SCAN  Technique:  Wash-in, equilibrium, and wash-out phase ventilation images were obtained using Xe-133 gas.  Perfusion images were obtained in multiple projections after intravenous injection of Tc- 51m MAA.  Radiopharmaceuticals:  20 mCi Xe-133 gas and 3 mCi Tc-74m MAA.  Comparison:  Chest radiograph - 06/15/2012  Findings:  Review of recent chest radiograph demonstrates borderline enlarged cardiac silhouette and mediastinal contours.  Left anterior chest wall dual lead pacemaker.  Mild elevation of the right hemidiaphragm.  Minimal  basilar opacities, left greater than right, favored to represent atelectasis.  Ventilatory images: Mild diffuse retention of inhaled radiotracers suggestive of airways disease.  There is an expected photopenic defect at the location of the left anterior chest wall pacemaker power pack.  Perfusion images: Homogeneous distribution of injected radiotracer. No segmental or subsegmental mismatched perfusion defects to suggest pulmonary embolism.  There is expected photopenic defect at the site of the left anterior chest wall pacer power pack.  IMPRESSION: 1.  Pulmonary embolism absent (very low probability for pulmonary embolism). 2.  Mild diffuse retention of inhaled radiotracer suggestive of airways disease.   Original Report Authenticated By: Waynard Reeds, M.D.     Scheduled Meds:    . amiodarone  100 mg Oral Daily  . cholecalciferol  1,000 Units Oral Daily  . docusate sodium  100 mg Oral BID  . ferrous sulfate  325 mg Oral Q breakfast  . insulin aspart  0-9 Units Subcutaneous TID WC  . isosorbide mononitrate  30 mg Oral Daily  . levothyroxine  75 mcg Oral Q0600  . metoprolol tartrate  12.5 mg Oral BID  . pantoprazole  40 mg Oral Q1200  . raloxifene  60 mg Oral Daily  . sodium chloride  3 mL Intravenous Q12H  . DISCONTD: amLODipine  10 mg Oral Daily  . DISCONTD: metoprolol tartrate  25 mg Oral BID  .  DISCONTD: piperacillin-tazobactam (ZOSYN)  IV  3.375 g Intravenous Q8H  . DISCONTD: vancomycin  750 mg Intravenous Q12H   Continuous Infusions:    . sodium chloride 75 mL/hr at 06/18/12 0106       Time spent: 30 minutes    Mahala Menghini [facility]  [facility] Pager [phone] If 8PM-8AM, please contact night-coverage at [email or web address], password [facility] 06/18/2012, 11:20 AM  LOS: 7 days

## 2012-06-18 NOTE — Progress Notes (Signed)
PT RIGHT HIP DRSG REINFORCED (TOTAL OF TWICE DURING THE NIGHT).  SEROSANGUINOUS DRAINAGE SEEMS TO HAVE INCREASED TO A MODERARTE AMOUNT.  PT HGB UNCHANGED. HOWEVER, [facility] INFORMED FOR AM F/U WITH ROUNDING MD.

## 2012-06-18 NOTE — Progress Notes (Signed)
eLink Physician-Brief Progress Note Patient Name: Melanie [name] DOB: 1925/09/29 MRN: [id number]  Date of Service  06/18/2012   HPI/Events of Note   77 seconds of VTach, stable BP and mental status At baseline has a-fib, CAD, pacer  eICU Interventions  Advised RN to notify primary service I have sent BMET and Mg and will follow up Will make sure she is on home amiodarone and metoprolol       Melanie [name] 06/18/2012, 3:50 PM

## 2012-06-19 LAB — BASIC METABOLIC PANEL
BUN: 16 mg/dL (ref 6–23)
CO2: 24 mEq/L (ref 19–32)
Calcium: 9.5 mg/dL (ref 8.4–10.5)
Chloride: 98 mEq/L (ref 96–112)
Creatinine, Ser: 0.71 mg/dL (ref 0.50–1.10)

## 2012-06-19 LAB — CBC
HCT: 27.1 % — ABNORMAL LOW (ref 36.0–46.0)
MCH: 28.4 pg (ref 26.0–34.0)
MCV: 85.5 fL (ref 78.0–100.0)
RBC: 3.17 MIL/uL — ABNORMAL LOW (ref 3.87–5.11)
WBC: 7.9 10*3/uL (ref 4.0–10.5)

## 2012-06-19 MED ORDER — GLUCERNA SHAKE PO LIQD
237.0000 mL | Freq: Two times a day (BID) | ORAL | Status: DC
  Administered 2012-06-19 – 2012-06-20 (×2): 237 mL via ORAL
  Filled 2012-06-19 (×3): qty 237

## 2012-06-19 NOTE — Progress Notes (Signed)
Physical Therapy Treatment Patient Details Name: Melanie Melanie MRN: [id number] DOB: 11-Mar-1926 Today's Date: 06/19/2012 Time: 4098-1191 PT Time Calculation (min): 23 min  PT Assessment / Plan / Recommendation Comments on Treatment Session  Pt. improving in mobility today. was able to stand w/ very little weight on R leg. much duifficulty w/ pivot.     Follow Up Recommendations  Skilled nursing facility    Barriers to Discharge        Equipment Recommendations  Defer to next venue    Recommendations for Other Services    Frequency Min 3X/week   Plan Discharge plan remains appropriate;Frequency remains appropriate    Precautions / Restrictions Precautions Precaution Comments: support R leg to prevent too much knee flexion  Restrictions Weight Bearing Restrictions: Yes RLE Weight Bearing: Touchdown weight bearing   Pertinent Vitals/Pain Reports R knee and thigh hurt w/ flexion but not at rest. Pt. Declined medication.    Mobility  Bed Mobility Bed Mobility: Supine to Sit;Sitting - Scoot to Edge of Bed Supine to Sit: 1: +2 Total assist Supine to Sit: Patient Percentage: 30% Sitting - Scoot to Edge of Bed: 2: Max assist Sitting - Scoot to [place] of Bed: Patient Percentage: 30% Transfers Transfers: Sit to Stand;Stand to [employer] Transfers Sit to Stand: 1: +2 Total assist;From elevated surface;From bed Sit to Stand: Patient Percentage: 50% (x 2 trials) Stand to Sit: 1: +2 Total assist;To chair/3-in-1;To bed Stand to Sit: Patient Percentage: 50% Stand Pivot Transfers: 1: +2 Total assist Stand Pivot Transfers: Patient Percentage: 40% Details for Transfer Assistance: Pt. did stand x 2 . Had difficulty turning on R foot, R knee nearly buckled before getting in front of recliner.    Exercises     PT Diagnosis:    PT Problem List:   PT Treatment Interventions:     PT Goals Acute Rehab PT Goals Pt will go Supine/Side to Sit: with mod assist PT Goal: Supine/Side to Sit  - Progress: Progressing toward goal Pt will go Sit to Stand: with max assist PT Goal: Sit to Stand - Progress: Progressing toward goal Pt will go Stand to Sit: with max assist PT Goal: Stand to Sit - Progress: Progressing toward goal Pt will Transfer Bed to Chair/Chair to Bed: with +2 total assist PT Transfer Goal: Bed to Chair/Chair to Bed - Progress: Progressing toward goal  Visit Information  Last PT Received On: 06/19/12 Assistance Needed: +2    Subjective Data  Subjective: Oh, me. I am so weak.   Cognition  Overall Cognitive Status: Appears within functional limits for tasks assessed/performed    Balance     End of Session PT - End of Session Activity Tolerance: Patient limited by fatigue;Patient limited by pain Patient left: in chair;with call bell/phone within reach Nurse Communication: Mobility status;Need for lift equipment CPM Right Knee CPM Right Knee: Off   GP     Melanie Melanie 06/19/2012, 10:01 AM Blanchard Kelch [phone]

## 2012-06-19 NOTE — Progress Notes (Signed)
[facility] PROGRESS NOTE  Melanie [name] ZOX:[id number] DOB: 27-Jun-1926 DOA: 06/11/2012 PCP: Ignatius Specking., MD Cardiologist=De Natasha Bence EP=S Graciela Husbands   Brief narrative:  78 year old female with history of Campath induced A. fib controlled on Amiodarone/Metoprolol and on Coumadin, sinus node dysfunction status Medtronic dual chamber PPM 05/24/03, hypertension, dyslipidemia, CAD (Two vessel, quiescent; 70% LAD artery disease and well collateralized, 100% RCA by cardiac cath in 2004; NL LVF; low risk Cardioloite; EF 74%, 11/10) who was in her usual state of health was brought to the ED 9/15 after she sustained a mechanical fall and fractured her right femur.  Patient has been persistently febrile and has had multiple episodes of low-grade fevers and was initially treated for what was thought to be urinary tract infection until 06/16/2012 and then her antibiotics were transitioned after a spike of temperature to vancomycin and Zosyn for broader coverage. She was reviewed by Orthopedics on 9/21 and thought to have potential retained blood in R hip replacement which was likely etiology for fevers and has been d/c off of Abx 9/21.  She will need to ambulate c therapy and potentially can be transferred to [facility] floor if she remains stable  Assessment/Plan:   1. Femur fracture, right  -Day #6 status post op right hip subtrochanteric fracture with intramedullary nail placed 06/13/2012. Coumadin held and given 10 mg of vitamin K on 9/16. -Her wound appeared some serous sanguinous drainage Dr. Ophelia Charter kindly reviewed this and dressed it with recommendations 9/21 -He will require thigh-high SCDs for DVT prophylaxis although this is suboptimal compared to heparin -Patient is partial weight bearing therapy-she will need short-term skilled care for physical therapy's note in/18 -Patient needs incentive spirometry every 2 hourly x24 hourly -Careful re-implementation of Xarelto/coumadin after review at bedside by  Ortho? When should we attempt this? thnx  Active Problems:  1. Anemia-multifactorial-Anemia of acute blood loss+ chronic anemia Take iron tablets at home. Transfused this admission intraoperatively 9/17 2 units packed red blood cells-and also transfused 2 units prior to surgery on 9/17 continue ferrous sulfate 325 daily-transfused again 06/16/2012 -Her hemoglobin has trended steadily downwards to 6.9 and 9/20 am and was transfused again to Hb 8.5 9/21 am and has stabilized there -Phone consulted hematology Dr. Gaylyn Rong 9/20 to determine-patient's d-dimer and fibrinogen were grossly altered, suggesting that she might have either sepsis versus primary pulmonary process such as a DVT or PE-I am inclined to think PE is less likely at present as clinically she is not tachycardic, has minimal respiratory symptoms and has no CP--V/Q scan 9/21 was neg  Fever + /-Early SIRS  -Patient has had continuous temperatures over 101.0 since 9/18-this coincides with her having surgery. Work-up neg-- thought by Dr. Ophelia Charter 9/21 to be 2/2 to blood collection in R thigh.  Abx d/c 9/21--Still has fever. Will consider ID consult if persists.  Will get ESR and CRP today  Hypothyroidism  -Stable. Continue Synthroid 75 mcg daily  HYPERLIPIDEMIA-MIXED  -On Crestor at home which I will switch to Zocor 5 mg, patient to continue Zetia at 10 mg although decreased evidence for mortality benefit-deferred to primary care physician  HYPERTENSION, BENIGN  -Controlled -Continue amlodipine 10 mg daily, Imdur 30 mg daily, metoprolol tartrate 25 mg twice a day-controlled  CAD, NATIVE VESSEL  -Continue statin, beta blocker and Imdur at above doses  ATRIAL FIBRILLATION, PAROXYSMAL  -Episode of SVT 9/22 with 77 beats of Non-sustained Vtach-Spoke with Cardiology Fellow 9/22 and requested EP eval patient today 9/23 -[employer] rep contacted by RN  this am to ensure Pace-maker still functional -Coumadin held until HB recovers-Continue amiodarone  100 po qd-maintaining NSR-supposed to see Dr. Johney Frame for pacemaker replacement per his last office note  Sinoatrial node dysfunction  -History of pacemaker-see above narrative-outpt f/u Dr. Adine Madura soon for battery change  Elevated blood sugar without diagnosis of diabetes -A1c 5.6.  Hold coverage unless blood sugar above 200 consistently. Check CBG 4 times a day a.c. at bedtime  Thrombocytopenia- -She has dropped from admission 210 to 92-->69 -her platelets have rebounded to 107 9/21 am--This has resolved Suspect total RBC deficit 2/2 to ABLA as per above-she lost 700 mils during surgery Reticulocyte count reassuring @2 .9 ,9/21  Mild hyponatremia-resolving -? From ABLA from surgery vs poor po intake-Bmet am.  Renal function declined, and has subsequently resolved slowly Rpt Bmet am  Leukocytosis -please see above discussion  Elevated liver enzymes Rpt in the am-AST was 63 on 9/20-Likely Boney re-modelling vs Medication effect(had Orthopedic surgery)  Code Status: Full Code  Family Communication: Bedside updated patient/family 9/21 am c Orthopedics Disposition Plan: SDU--potential Tx to Tele this am   Consultants:  Orthopedics-Dr. August Saucer  Hematology-Telephone consulted Dr. Gaylyn Rong 9/20  ID  Procedures:  Status post right hip replacement and intramedullary nailing 06/13/2012  Chest x-ray 1 view 9/18 = low lung volumes with vascular crowding and atelectasis, mild congestion and bronchitic changes no edema or effusions  Chest x-ray 1 view 9/19 = mild bibasilar opacities without significant interval change, fever atelectasis crease vascular congestion bronchitic change  V/q Scan 9/21 was neg for PE  Antibiotics:  Ancef 9.17.13>>>9/20  Vanc and Zosyn 9/20>>>>9/21  Cultures all neg so far  HPI/Subjective:  Much brighter appearing this am.  Minimal pain Tolerated standing this am-Moved around in the room Nursing reports drainage and the need to re-inforce dressing over  R lateral thigh.  Objective: Filed Vitals:   06/19/12 0251 06/19/12 0400 06/19/12 0540 06/19/12 0737  BP:  140/56  136/57  Pulse: 83   78  Temp: 101.1 F (38.4 C)   99.9 F (37.7 C)  TempSrc:      Resp: 24   23  Height:      Weight:   95.2 kg (209 lb 14.1 oz)   SpO2: 98%   100%    Intake/Output Summary (Last 24 hours) at 06/19/12 0753 Last data filed at 06/19/12 0600  Gross per 24 hour  Intake   1365 ml  Output   3400 ml  Net  -2035 ml   Filed Weights   06/15/12 0000 06/17/12 0500 06/19/12 0540  Weight: 75.5 kg (166 lb 7.2 oz) 91.8 kg (202 lb 6.1 oz) 95.2 kg (209 lb 14.1 oz)    Exam:  General: Elderly female lying in bed in no acute distress, no pallor or icterus  HEENT: No pallor, moist oral mucosa, no JVD Cardiovascular: S1 S2 no murmurs rub or gallop  Respiratory: Breaths Sounds bilaterally decreased -no rales, crackles Abdomen: soft ,non tender, non-distended bowel sounds present Foley in place  Extremities:bandages over right lateral hip and leg-Reinforced Bandage in the R hip region   Data Reviewed: Basic Metabolic Panel:  Lab 06/19/12 4098 06/18/12 1637 06/18/12 0338 06/17/12 0335 06/16/12 0841  NA 131* 132* 132* 127* 126*  K 3.8 3.9 3.7 3.9 4.3  CL 98 100 100 95* 94*  CO2 24 25 24 25 24   GLUCOSE 116* 163* 108* 111* 112*  BUN 16 18 21  24* 19  CREATININE 0.71 0.80 0.82 1.13* 0.93  CALCIUM  9.5 9.5 9.4 9.5 10.0  MG -- 1.9 -- -- --  PHOS -- -- -- -- --   Liver Function Tests:  Lab 06/18/12 0338 06/16/12 0841 06/15/12 0315  AST 42* 63* 32  ALT 36* 32 16  ALKPHOS 92 80 40  BILITOT 0.8 0.6 0.4  PROT 5.1* 5.2* 4.7*  ALBUMIN 1.6* 1.6* 1.6*   No results found for this basename: LIPASE:5,AMYLASE:5 in the last 168 hours No results found for this basename: AMMONIA:5 in the last 168 hours CBC:  Lab 06/19/12 0333 06/18/12 0338 06/17/12 0335 06/16/12 2129 06/16/12 0841 06/16/12 0320 06/15/12 1310 06/15/12 0315 06/14/12 1752 06/14/12 1220  WBC 7.9 8.1 10.0  -- -- 11.0* 11.0* -- -- --  NEUTROABS -- 6.0 -- -- -- -- -- 7.9* 11.5* 11.3*  HGB 9.0* 8.5* 8.5* 8.7* -- 6.9* -- -- -- --  HCT 27.1* 25.4* 24.9* 25.6* -- 19.8* -- -- -- --  MCV 85.5 84.7 83.3 -- -- 85.0 84.9 -- -- --  PLT 139* 120* 105* -- 101* 87* -- -- -- --   Cardiac Enzymes: No results found for this basename: CKTOTAL:5,CKMB:5,CKMBINDEX:5,TROPONINI:5 in the last 168 hours BNP (last 3 results) No results found for this basename: PROBNP:3 in the last 8760 hours CBG:  Lab 06/18/12 1544 06/18/12 1203 06/18/12 0804 06/17/12 2244 06/17/12 1619  GLUCAP 148* 104* 106* 131* 139*    Recent Results (from the past 240 hour(s))  MRSA PCR SCREENING     Status: Normal   Collection Time   06/12/12 12:22 PM      Component Value Range Status Comment   MRSA by PCR NEGATIVE  NEGATIVE Final   SURGICAL PCR SCREEN     Status: Normal   Collection Time   06/13/12  1:52 PM      Component Value Range Status Comment   MRSA, PCR NEGATIVE  NEGATIVE Final    Staphylococcus aureus NEGATIVE  NEGATIVE Final   URINE CULTURE     Status: Normal   Collection Time   06/14/12 10:45 AM      Component Value Range Status Comment   Specimen Description URINE, CATHETERIZED   Final    Special Requests NONE   Final    Culture  Setup Time 06/15/2012 01:35   Final    Colony Count NO GROWTH   Final    Culture NO GROWTH   Final    Report Status 06/16/2012 FINAL   Final   CULTURE, BLOOD (ROUTINE X 2)     Status: Normal (Preliminary result)   Collection Time   06/14/12 12:15 PM      Component Value Range Status Comment   Specimen Description BLOOD LEFT ARM   Final    Special Requests BOTTLES DRAWN AEROBIC ONLY 4CC   Final    Culture  Setup Time 06/14/2012 20:41   Final    Culture     Final    Value:        BLOOD CULTURE RECEIVED NO GROWTH TO DATE CULTURE WILL BE HELD FOR 5 DAYS BEFORE ISSUING A FINAL NEGATIVE REPORT   Report Status PENDING   Incomplete   CULTURE, BLOOD (ROUTINE X 2)     Status: Normal (Preliminary  result)   Collection Time   06/14/12 12:20 PM      Component Value Range Status Comment   Specimen Description BLOOD RIGHT HAND   Final    Special Requests BOTTLES DRAWN AEROBIC ONLY 3CC   Final    Culture  Setup Time 06/14/2012 20:41   Final    Culture     Final    Value:        BLOOD CULTURE RECEIVED NO GROWTH TO DATE CULTURE WILL BE HELD FOR 5 DAYS BEFORE ISSUING A FINAL NEGATIVE REPORT   Report Status PENDING   Incomplete   CULTURE, BLOOD (ROUTINE X 2)     Status: Normal (Preliminary result)   Collection Time   06/16/12  1:30 PM      Component Value Range Status Comment   Specimen Description BLOOD RIGHT ARM   Final    Special Requests BOTTLES DRAWN AEROBIC AND ANAEROBIC 10CC   Final    Culture  Setup Time 06/16/2012 21:50   Final    Culture     Final    Value:        BLOOD CULTURE RECEIVED NO GROWTH TO DATE CULTURE WILL BE HELD FOR 5 DAYS BEFORE ISSUING A FINAL NEGATIVE REPORT   Report Status PENDING   Incomplete   CULTURE, BLOOD (ROUTINE X 2)     Status: Normal (Preliminary result)   Collection Time   06/16/12  1:45 PM      Component Value Range Status Comment   Specimen Description BLOOD RIGHT HAND   Final    Special Requests BOTTLES DRAWN AEROBIC AND ANAEROBIC 10CC   Final    Culture  Setup Time 06/16/2012 21:49   Final    Culture     Final    Value:        BLOOD CULTURE RECEIVED NO GROWTH TO DATE CULTURE WILL BE HELD FOR 5 DAYS BEFORE ISSUING A FINAL NEGATIVE REPORT   Report Status PENDING   Incomplete      Studies: Nm Pulmonary Perf And Vent  06/17/2012  *RADIOLOGY REPORT*  Clinical Data:  Multiple risk factors for pulmonary embolism including atrial fibrillation, orthopedic surgery, evaluate for pulmonary embolism  NUCLEAR MEDICINE VENTILATION - PERFUSION LUNG SCAN  Technique:  Wash-in, equilibrium, and wash-out phase ventilation images were obtained using Xe-133 gas.  Perfusion images were obtained in multiple projections after intravenous injection of Tc- 5m MAA.   Radiopharmaceuticals:  20 mCi Xe-133 gas and 3 mCi Tc-42m MAA.  Comparison:  Chest radiograph - 06/15/2012  Findings:  Review of recent chest radiograph demonstrates borderline enlarged cardiac silhouette and mediastinal contours.  Left anterior chest wall dual lead pacemaker.  Mild elevation of the right hemidiaphragm.  Minimal basilar opacities, left greater than right, favored to represent atelectasis.  Ventilatory images: Mild diffuse retention of inhaled radiotracers suggestive of airways disease.  There is an expected photopenic defect at the location of the left anterior chest wall pacemaker power pack.  Perfusion images: Homogeneous distribution of injected radiotracer. No segmental or subsegmental mismatched perfusion defects to suggest pulmonary embolism.  There is expected photopenic defect at the site of the left anterior chest wall pacer power pack.  IMPRESSION: 1.  Pulmonary embolism absent (very low probability for pulmonary embolism). 2.  Mild diffuse retention of inhaled radiotracer suggestive of airways disease.   Original Report Authenticated By: Waynard Reeds, M.D.     Scheduled Meds:    . amiodarone  100 mg Oral Daily  . cholecalciferol  1,000 Units Oral Daily  . docusate sodium  100 mg Oral BID  . ferrous sulfate  325 mg Oral Q breakfast  . isosorbide mononitrate  30 mg Oral Daily  . levothyroxine  75 mcg Oral Q0600  . metoprolol tartrate  12.5  mg Oral BID  . pantoprazole  40 mg Oral Q1200  . raloxifene  60 mg Oral Daily  . sodium chloride  3 mL Intravenous Q12H  . DISCONTD: insulin aspart  0-9 Units Subcutaneous TID WC   Continuous Infusions:    . DISCONTD: sodium chloride 75 mL/hr at 06/18/12 0106   Time spent: 30 minutes  Mahala Menghini [facility]  [facility] Pager [phone] If 8PM-8AM, please contact night-coverage at [email or web address], password [facility] 06/19/2012, 7:53 AM  LOS: 8 days

## 2012-06-19 NOTE — Progress Notes (Signed)
Pt stable - vss Incision draining - all from staple sites No erythema Wbc down Dressing reapplied mobilize with PT  Ok for dc to snf Hematoma from leg will take several weeks to decrease Continue cpm

## 2012-06-19 NOTE — Progress Notes (Signed)
[id number] Earlene Plater, RN, BSN, CCM: CHART REVIEWED AND UPDATED. NO DISCHARGE NEEDS PRESENT AT THIS TIME. CASE MANAGEMENT [phone]

## 2012-06-19 NOTE — Progress Notes (Signed)
Occupational Therapy Treatment Patient Details Name: Melanie [name] MRN: [id number] DOB: 1926/02/14 Today's Date: 06/19/2012 Time: 4098-1191 OT Time Calculation (min): 23 min  OT Assessment / Plan / Recommendation Comments on Treatment Session Pt did much better today but still continues to be limited by pain.    Follow Up Recommendations  Skilled nursing facility    Barriers to Discharge       Equipment Recommendations  Defer to next venue    Recommendations for Other Services    Frequency Min 1X/week   Plan Discharge plan remains appropriate    Precautions / Restrictions Precautions Precaution Comments: support R leg to prevent too much knee flexion  Restrictions RLE Weight Bearing: Touchdown weight bearing   Pertinent Vitals/Pain Pt c/o significant pain with mobility in L hip. Did not rate. Repositioned for comfort.    ADL  Grooming: Simulated;Set up Where Assessed - Grooming: Unsupported sitting Toilet Transfer: Performed;+2 Total assistance Toilet Transfer: Patient Percentage: 40% Toilet Transfer Method: Stand pivot Equipment Used: Rolling walker ADL Comments: Pt did much better today and was able to pivot to the chair.    OT Diagnosis:    OT Problem List:   OT Treatment Interventions:     OT Goals ADL Goals ADL Goal: Grooming - Progress: Progressing toward goals ADL Goal: Toilet Transfer - Progress: Progressing toward goals ADL Goal: Additional Goal #1 - Progress: Progressing toward goals  Visit Information  Last OT Received On: 06/19/12 Assistance Needed: +2 PT/OT Co-Evaluation/Treatment: Yes    Subjective Data  Subjective: "Oh me. Oh me...Marland KitchenMarland Kitchen"   Prior Functioning       Cognition  Overall Cognitive Status: Appears within functional limits for tasks assessed/performed    Mobility  Shoulder Instructions Bed Mobility Bed Mobility: Supine to Sit;Sitting - Scoot to Edge of Bed Supine to Sit: 1: +2 Total assist Supine to Sit: Patient Percentage:  30% Sitting - Scoot to Edge of Bed: 2: Max assist Sitting - Scoot to [place] of Bed: Patient Percentage: 30% Transfers Sit to Stand: 1: +2 Total assist;From elevated surface;From bed Sit to Stand: Patient Percentage: 50% (x 2 trials) Stand to Sit: 1: +2 Total assist;To chair/3-in-1;To bed Stand to Sit: Patient Percentage: 50% Details for Transfer Assistance: Pt. did stand x 2 . Had difficulty turning on R foot, R knee nearly buckled before getting in front of recliner.       Exercises      Balance Static Sitting Balance Static Sitting - Balance Support: No upper extremity supported;Feet supported Static Sitting - Level of Assistance: 5: Stand by assistance   End of Session OT - End of Session Activity Tolerance: Patient limited by pain Patient left: in chair;with call bell/phone within reach Nurse Communication: Mobility status;Need for lift equipment  GO     Daana Petrasek A 06/19/2012, 12:29 PM

## 2012-06-19 NOTE — Progress Notes (Signed)
[employer] representative at pt bedside. dph

## 2012-06-19 NOTE — Consult Note (Signed)
  Reason for Consult:"SVT"  Referring Physician: Angelita Harnack is an 78 y.o. female.   HPI: The patient is a very pleasant 78 yo woman with a h/o symptomatic bradycardia s/p PPM, with a h/o PAF on low dose amiodarone. She fell and broke her hip several days ago. She was said to have "SVT" yesterday and we are consulted for evaluation. I have reviewed her tele strips and PPM interogation which both reveal atrial fibrillation. The patient denies palpitations though I note documentation that she is not a coumadin candidate secondary to falls. She currently complains of the wrap on her right leg as being too tight. She apparently bled into her incision. No h/o frank syncope.  PMH: Past Medical History  Diagnosis Date  . Vertigo   . Paroxysmal atrial fibrillation     on coumadin and amiodarone  . Sinus node dysfunction     s/p [employer] dual-chamber pacemaker  . Hypercalcemia   . Hypertension   . Dyslipidemia   . CAD (coronary artery disease)     Two vessel, quiescent; 70% LAD aretery disease and well collateralized, 100% RCA by cardiac cath in 2004; NL LVF; low risk Cardioloite; EF 74%, 11/10  . Hypothyroidism     PSHX: Past Surgical History  Procedure Date  . Pacemaker insertion 05/24/03    MDT Kappa    FAMHX: Family History  Problem Relation Age of Onset  . Hypertension Neg Hx   . Diabetes Neg Hx   . Coronary artery disease Neg Hx     Social History:  reports that she has never smoked. She has never used smokeless tobacco. She reports that she does not drink alcohol or use illicit drugs.  Allergies: No Known Allergies  Medications: reviewed  No results found.  ROS  As stated in the HPI and negative for all other systems.  Physical Exam  Vitals:Blood pressure 151/51, pulse 81, temperature 100.8 F (38.2 C), temperature source Core (Comment), resp. rate 18, height 5\' 4"  (1.626 m), weight 209 lb 14.1 oz (95.2 kg), SpO2 93.00%.  JXB:JYNW appearing elderly  woman, NAD HEENT: Unremarkable Neck:  No JVD, no thyromegally Lungs:  Clear with no wheezes, rales, or rhonchi HEART:  Regular rate rhythm, soft systolic murmur, no rubs, no clicks Abd:  Flat, positive bowel sounds, no organomegally, no rebound, no guarding Ext:  2 plus pulses, no edema, no cyanosis, no clubbing Skin:  No rashes no nodules Neuro:  CN II through XII intact, motor grossly intact  Tele - NSR with PAF and occaisional runs of ventricular pacing. Assessment/Plan: 1. "SVT" this is actually atrial fib and paroxysmal. No specific treatment except consider IV cardizem if her atrial fib returns and is sustained. Agree that she is not a candidate for long term coumadin.  2. PPM - her device is approx. 7 months from ERI. No additional evaluation is indicated.  Rec: no additional treatment is recommended. Please call if the patient develops atrial fib with RVR.   Sharlot Gowda TaylorMD 06/19/2012, 6:18 PM

## 2012-06-19 NOTE — Clinical Social Work Note (Signed)
CSW updated [facility]. They can take Pt when she is ready. Pt already completed paperwork for facility.  CSW to follow for SNF.   Doreen Salvage, LCSWA ICU/Stepdown Clinical Social Worker [facility] Cell [phone] Hours 8am-1200pm M-F

## 2012-06-19 NOTE — Progress Notes (Signed)
Pacemaker interrogation requested with Huntley Dec at [employer]. dph

## 2012-06-19 NOTE — Progress Notes (Signed)
INITIAL ADULT NUTRITION ASSESSMENT Date: 06/19/2012   Time: 10:53 AM Reason for Assessment: Nutrition Risk   ASSESSMENT: Female 78 y.o.  Dx: Femur fracture, right  Hx:  Past Medical History  Diagnosis Date  . Vertigo   . Paroxysmal atrial fibrillation     on coumadin and amiodarone  . Sinus node dysfunction     s/p Medtronic dual-chamber pacemaker  . Hypercalcemia   . Hypertension   . Dyslipidemia   . CAD (coronary artery disease)     Two vessel, quiescent; 70% LAD aretery disease and well collateralized, 100% RCA by cardiac cath in 2004; NL LVF; low risk Cardioloite; EF 74%, 11/10  . Hypothyroidism     Related Meds:  Scheduled Meds:   . amiodarone  100 mg Oral Daily  . cholecalciferol  1,000 Units Oral Daily  . docusate sodium  100 mg Oral BID  . ferrous sulfate  325 mg Oral Q breakfast  . isosorbide mononitrate  30 mg Oral Daily  . levothyroxine  75 mcg Oral Q0600  . metoprolol tartrate  12.5 mg Oral BID  . pantoprazole  40 mg Oral Q1200  . raloxifene  60 mg Oral Daily  . sodium chloride  3 mL Intravenous Q12H  . DISCONTD: insulin aspart  0-9 Units Subcutaneous TID WC   Continuous Infusions:   . DISCONTD: sodium chloride 75 mL/hr at 06/18/12 0106   PRN Meds:.HYDROcodone-acetaminophen, menthol-cetylpyridinium, morphine injection, ondansetron (ZOFRAN) IV, ondansetron, phenol, promethazine, DISCONTD: acetaminophen, DISCONTD: acetaminophen   Ht: 5\' 4"  (162.6 cm)  Wt: 209 lb 14.1 oz (95.2 kg)  Ideal Wt: 54.5 kg % Ideal Wt: 174% Wt Readings from Last 10 Encounters:  06/19/12 209 lb 14.1 oz (95.2 kg)  06/19/12 209 lb 14.1 oz (95.2 kg)  01/05/12 154 lb (69.854 kg)  08/25/11 155 lb (70.308 kg)  06/03/11 161 lb (73.029 kg)  10/06/10 158 lb (71.668 kg)  06/26/10 160 lb (72.576 kg)  03/04/10 160 lb (72.576 kg)  09/23/09 161 lb (73.029 kg)  08/12/09 164 lb (74.39 kg)  *Weight has been trending up.  BMI: 35.8 kg/m^2 (Obesity class 2)  Food/Nutrition Related Hx:  Patient reported her appetite is getting a litle better. She reported foods just don't taste well anymore. PO intake documented 35% at meals. Pt breakfast tray present at time of RD visit, PO intake 35%. Patient agreed to try nutrition supplement and voiced snack preferences.   Labs:  CMP     Component Value Date/Time   NA 131* 06/19/2012 0333   K 3.8 06/19/2012 0333   CL 98 06/19/2012 0333   CO2 24 06/19/2012 0333   GLUCOSE 116* 06/19/2012 0333   BUN 16 06/19/2012 0333   CREATININE 0.71 06/19/2012 0333   CALCIUM 9.5 06/19/2012 0333   PROT 5.1* 06/18/2012 0338   ALBUMIN 1.6* 06/18/2012 0338   AST 42* 06/18/2012 0338   ALT 36* 06/18/2012 0338   ALKPHOS 92 06/18/2012 0338   BILITOT 0.8 06/18/2012 0338   GFRNONAA 77* 06/19/2012 0333   GFRAA 89* 06/19/2012 0333    Intake/Output Summary (Last 24 hours) at 06/19/12 1054 Last data filed at 06/19/12 0600  Gross per 24 hour  Intake    595 ml  Output   2850 ml  Net  -2255 ml     Diet Order: Carb Control  Supplements/Tube Feeding: none at this time  IVF:    DISCONTD: sodium chloride Last Rate: 75 mL/hr at 06/18/12 0106    Estimated Nutritional Needs:   Kcal: 1390-1630  Protein: 104-142 grams  Fluid:  1 ml per kcal intake   NUTRITION DIAGNOSIS: -Inadequate oral intake (NI-2.1).  Status: Ongoing  RELATED TO: poor appetite and decreased taste  AS EVIDENCE BY: PO intake 35% of meals.   MONITORING/EVALUATION(Goals): PO intake, weights, labs 1. PO intake > 50% at meals.    EDUCATION NEEDS: -No education needs identified at this time  INTERVENTION: 1. Will order patient Glucerna BID, provides 440 kcal and 20 grams of protein.  2. Will order patient snacks once daily to increase PO intake.  3. RD to follow for nutrition plan of care.   Dietitian [phone]  DOCUMENTATION CODES Per approved criteria  -Obesity Unspecified    Iven Finn [facility] 06/19/2012, 10:53 AM

## 2012-06-20 DIAGNOSIS — R279 Unspecified lack of coordination: Secondary | ICD-10-CM | POA: Diagnosis not present

## 2012-06-20 DIAGNOSIS — Z4789 Encounter for other orthopedic aftercare: Secondary | ICD-10-CM | POA: Diagnosis not present

## 2012-06-20 DIAGNOSIS — R55 Syncope and collapse: Secondary | ICD-10-CM | POA: Diagnosis not present

## 2012-06-20 DIAGNOSIS — N390 Urinary tract infection, site not specified: Secondary | ICD-10-CM | POA: Diagnosis not present

## 2012-06-20 DIAGNOSIS — I10 Essential (primary) hypertension: Secondary | ICD-10-CM | POA: Diagnosis not present

## 2012-06-20 DIAGNOSIS — Z79899 Other long term (current) drug therapy: Secondary | ICD-10-CM | POA: Diagnosis not present

## 2012-06-20 DIAGNOSIS — M810 Age-related osteoporosis without current pathological fracture: Secondary | ICD-10-CM | POA: Diagnosis not present

## 2012-06-20 DIAGNOSIS — I517 Cardiomegaly: Secondary | ICD-10-CM | POA: Diagnosis not present

## 2012-06-20 DIAGNOSIS — R262 Difficulty in walking, not elsewhere classified: Secondary | ICD-10-CM | POA: Diagnosis not present

## 2012-06-20 DIAGNOSIS — Z5189 Encounter for other specified aftercare: Secondary | ICD-10-CM | POA: Diagnosis not present

## 2012-06-20 DIAGNOSIS — G4700 Insomnia, unspecified: Secondary | ICD-10-CM | POA: Diagnosis not present

## 2012-06-20 DIAGNOSIS — Z883 Allergy status to other anti-infective agents status: Secondary | ICD-10-CM | POA: Diagnosis not present

## 2012-06-20 DIAGNOSIS — I4891 Unspecified atrial fibrillation: Secondary | ICD-10-CM

## 2012-06-20 DIAGNOSIS — R112 Nausea with vomiting, unspecified: Secondary | ICD-10-CM | POA: Diagnosis not present

## 2012-06-20 DIAGNOSIS — E039 Hypothyroidism, unspecified: Secondary | ICD-10-CM | POA: Diagnosis not present

## 2012-06-20 DIAGNOSIS — Z888 Allergy status to other drugs, medicaments and biological substances status: Secondary | ICD-10-CM | POA: Diagnosis not present

## 2012-06-20 DIAGNOSIS — R21 Rash and other nonspecific skin eruption: Secondary | ICD-10-CM | POA: Diagnosis not present

## 2012-06-20 DIAGNOSIS — Z950 Presence of cardiac pacemaker: Secondary | ICD-10-CM | POA: Diagnosis not present

## 2012-06-20 DIAGNOSIS — S72109A Unspecified trochanteric fracture of unspecified femur, initial encounter for closed fracture: Secondary | ICD-10-CM | POA: Diagnosis not present

## 2012-06-20 DIAGNOSIS — I679 Cerebrovascular disease, unspecified: Secondary | ICD-10-CM | POA: Diagnosis not present

## 2012-06-20 DIAGNOSIS — M79609 Pain in unspecified limb: Secondary | ICD-10-CM | POA: Diagnosis not present

## 2012-06-20 DIAGNOSIS — D696 Thrombocytopenia, unspecified: Secondary | ICD-10-CM | POA: Diagnosis not present

## 2012-06-20 DIAGNOSIS — M2550 Pain in unspecified joint: Secondary | ICD-10-CM | POA: Diagnosis not present

## 2012-06-20 DIAGNOSIS — S7290XA Unspecified fracture of unspecified femur, initial encounter for closed fracture: Secondary | ICD-10-CM | POA: Diagnosis not present

## 2012-06-20 DIAGNOSIS — Z2821 Immunization not carried out because of patient refusal: Secondary | ICD-10-CM | POA: Diagnosis not present

## 2012-06-20 DIAGNOSIS — R69 Illness, unspecified: Secondary | ICD-10-CM | POA: Diagnosis not present

## 2012-06-20 DIAGNOSIS — I2510 Atherosclerotic heart disease of native coronary artery without angina pectoris: Secondary | ICD-10-CM | POA: Diagnosis not present

## 2012-06-20 DIAGNOSIS — Z7901 Long term (current) use of anticoagulants: Secondary | ICD-10-CM | POA: Diagnosis not present

## 2012-06-20 DIAGNOSIS — Z471 Aftercare following joint replacement surgery: Secondary | ICD-10-CM | POA: Diagnosis not present

## 2012-06-20 DIAGNOSIS — Z96649 Presence of unspecified artificial hip joint: Secondary | ICD-10-CM | POA: Diagnosis not present

## 2012-06-20 DIAGNOSIS — R404 Transient alteration of awareness: Secondary | ICD-10-CM | POA: Diagnosis not present

## 2012-06-20 DIAGNOSIS — R109 Unspecified abdominal pain: Secondary | ICD-10-CM | POA: Diagnosis not present

## 2012-06-20 DIAGNOSIS — E782 Mixed hyperlipidemia: Secondary | ICD-10-CM | POA: Diagnosis not present

## 2012-06-20 DIAGNOSIS — M6281 Muscle weakness (generalized): Secondary | ICD-10-CM | POA: Diagnosis not present

## 2012-06-20 DIAGNOSIS — D649 Anemia, unspecified: Secondary | ICD-10-CM | POA: Diagnosis not present

## 2012-06-20 DIAGNOSIS — K219 Gastro-esophageal reflux disease without esophagitis: Secondary | ICD-10-CM | POA: Diagnosis not present

## 2012-06-20 LAB — BASIC METABOLIC PANEL
BUN: 15 mg/dL (ref 6–23)
CO2: 25 mEq/L (ref 19–32)
Chloride: 98 mEq/L (ref 96–112)
Creatinine, Ser: 0.68 mg/dL (ref 0.50–1.10)
Glucose, Bld: 122 mg/dL — ABNORMAL HIGH (ref 70–99)

## 2012-06-20 LAB — CBC
HCT: 27.2 % — ABNORMAL LOW (ref 36.0–46.0)
Hemoglobin: 8.8 g/dL — ABNORMAL LOW (ref 12.0–15.0)
MCH: 27.7 pg (ref 26.0–34.0)
MCV: 85.5 fL (ref 78.0–100.0)
RBC: 3.18 MIL/uL — ABNORMAL LOW (ref 3.87–5.11)
WBC: 7.4 10*3/uL (ref 4.0–10.5)

## 2012-06-20 LAB — CULTURE, BLOOD (ROUTINE X 2): Culture: NO GROWTH

## 2012-06-20 MED ORDER — HYDROCODONE-ACETAMINOPHEN 5-325 MG PO TABS: 1.0000 | ORAL_TABLET | Freq: Four times a day (QID) | ORAL | Status: DC | PRN

## 2012-06-20 MED ORDER — AMLODIPINE BESYLATE 10 MG PO TABS: 10.0000 mg | ORAL_TABLET | Freq: Every day | ORAL | Status: DC

## 2012-06-20 NOTE — Progress Notes (Signed)
Patient is cleared for discharge. Packet copied and placed in [facility]. ptar called for transportation. Patient and family at bedside aware and agreeable.  Maisie Hauser C. Carolee Channell MSW, LCSW [phone]

## 2012-06-20 NOTE — Progress Notes (Signed)
Patient discharged to [facility] via ambulance. Discharge packet prepared by CSW and given to EMS transporter for the facility. Patient alert and oriented, family at the bedside during transportation, patient denies any pain/distress. Surgical incision intact, no S/Sx of any infection noted, bruises around incision site. No other wound noted.

## 2012-06-20 NOTE — Discharge Summary (Signed)
Physician Discharge Summary  Alaysia Lightle YNW:[id number] DOB: Feb 26, 1926 DOA: 06/11/2012  PCP: Ignatius Specking., MD  Admit date: 06/11/2012 Discharge date: 06/20/2012  Recommendations for Outpatient Follow-up:  1. Needs TSH/CBC and CMEt in about 1 weeks time-on high risk medications (Amiodarone) 2. Needs close out-patient follow up with Orthopedics-she is Non-weight bearing to RLE-Weight bearing status needs to be re-addressed as an out-patient with Dr. Colin Rhein in 2 weeks from day of d/c 3. Outpatient follow up with Dr. Ladona Ridgel, Electrophysiology 4. Check blood sugars randomly 5. NO ANTICOAGULATION FOR NOW 6. Recommend eventual goals of care discussion as she is at high risk for failure to thrive (include homehealth, outpatient follow-up instructions, specific recommendations for PCP to follow-up on, etc.)  Discharge Diagnoses:  Principal Problem:  *Femur fracture, right Active Problems:  HYPOTHYROIDISM  HYPERLIPIDEMIA-MIXED  HYPERTENSION, BENIGN  CAD, NATIVE VESSEL  ATRIAL FIBRILLATION, PAROXYSMAL  Sinoatrial node dysfunction  Anemia   Discharge Condition: Stable  Diet recommendation: Heart healthy  Filed Weights   06/15/12 0000 06/17/12 0500 06/19/12 0540  Weight: 75.5 kg (166 lb 7.2 oz) 91.8 kg (202 lb 6.1 oz) 95.2 kg (209 lb 14.1 oz)    History of present illness:  78 year old female with history of Campath induced A. fib controlled on Amiodarone/Metoprolol and on Coumadin, sinus node dysfunction status [employer] dual chamber PPM 05/24/03, hypertension, dyslipidemia, CAD (Two vessel, quiescent; 70% LAD artery disease and well collateralized, 100% RCA by cardiac cath in 2004; NL LVF; low risk Cardioloite; EF 74%, 11/10) who was in her usual state of health was brought to the ED 9/15 after she sustained a mechanical fall and fractured her right femur.  She was operated upon by Dr. Phillips Hay and subsequently noted to be persistently febrile and has had multiple episodes  of low-grade fevers and was initially treated for what was thought to be urinary tract infection until 06/16/2012 and then her antibiotics were transitioned after a spike of temperature to vancomycin and Zosyn for broader coverage.  She was reviewed by Orthopedics on 9/21 and thought to have potential retained blood in R hip replacement which was likely etiology for fevers and has been d/c off of Abx 9/21.  See below for details  Hospital Course:  Active Problems:    Hip Fracture-see above and below-Non-weight bearing until seen by Dr. Renato Gails in 2 weeks-No anticoagulation-Needs Thigh hi SCD's and potentially ASA 81 mg after being seen by Dr. Renato Gails in 2 weeks-Cholecalciferol 1,000 has been d/c-recommend Vit d level in 5 days, and potentially replacement with 50,000 units x 8 weeks as out-patient   Anemia-multifactorial-Anemia of acute blood loss+ chronic anemia Take iron tablets at home. Transfused this admission intraoperatively 9/17 2 units packed red blood cells-and also transfused 2 units prior to surgery on 9/17 continue ferrous sulfate 325 daily-transfused again 06/16/2012  -Her hemoglobin has trended steadily downwards to 6.9 and 9/20 am and was transfused again to Hb 8.5 9/21 am and has stabilized there  -Phone consulted hematology Dr. Gaylyn Rong 9/20 to determine-patient's d-dimer and fibrinogen were grossly altered, suggesting that she might have either sepsis versus primary pulmonary process such as a DVT or PE- PE is less likely at present as clinically she is not tachycardic, has minimal respiratory symptoms and has no CP--V/Q scan 9/21 was neg  Pathology smear review revealed Normocytic anemia and TCP Outpatient CBC should be done 3-5 days post-discharge home  Fever + /-Early SIRS -Patient has had continuous temperatures over 101.0 since 9/18-this coincides with her having surgery.  Work-up neg-- thought by Dr. Ophelia Charter 9/21 to be 2/2 to blood collection in R thigh. Abx d/c 9/21--Still has fever.  CRP  elevated at 10.9-thought to be non-specific in a setting of recent surgery and non-infectious fevers   Hypothyroidism  -Stable. Continue Synthroid 75 mcg daily  Rpt TSH in 5 days-is on Amiodarone  HYPERLIPIDEMIA-MIXED  -On Crestor at home which I will switch to Zocor 5 mg, patient to continue Zetia at 10 mg although decreased evidence for mortality benefit-deferred to primary care physician  I have held ALL statins-there might be some peri-operative decrease in Morbidity from statins-Would defer to PCP/Nursiong home MD Lipid panel in 3 months if thriving  HYPERTENSION, BENIGN  -Controlled  -Continue amlodipine 10 mg daily, Imdur 30 mg daily, metoprolol tartrate 25 mg twice a day-controlled   CAD, NATIVE VESSEL  -Continue statin, beta blocker and Imdur at above doses   ATRIAL FIBRILLATION, PAROXYSMAL-CHAD2VASC score=4 Not currently anticoagulation candidate given anemia Use SCD's at SNF (Thigh High) -Episode of SVT 9/22 with 77 beats of Non-sustained Vtach-requested EP eval patient today 9/23  Pacemaker subsequently interrogated-Potentially  AVNRT vs Afib-Seen by Dr. Ladona Ridgel of EP and recommended out-patient follow-up PPM battery has 7 months of life -[employer] rep contacted by RN this am to ensure Pace-maker still functional  -Coumadin held until HB recovers-Continue amiodarone 100 po qd-maintaining NSR-supposed to see Dr. Johney Frame for pacemaker replacement per his last office note   Sinoatrial node dysfunction  -History of pacemaker-see above narrative-outpt f/u Dr. Adine Madura soon for battery change   Elevated blood sugar without diagnosis of diabetes  -A1c 5.6. Hold coverage unless blood sugar above 200 consistently. Check CBG 4 times a day a.c. at bedtime   Thrombocytopenia-  -She has dropped from admission 210 to 92-->69 -her platelets have rebounded to 107 9/21 am--This has resolved  Suspect total RBC deficit 2/2 to ABLA as per above-she lost 700 mils during surgery    Reticulocyte count reassuring @2 .9 ,9/21  Pathology smear review revealed Normocytic anemia and TCP  Mild hyponatremia-resolving  -? From ABLA from surgery vs poor po intake-Bmet am. Renal function declined, and has subsequently resolved slowly  Rpt Bmet 3-5 days  Leukocytosis -please see above discussion   Elevated liver enzymes  Rpt in the am-AST was 63 on 9/20-Likely Boney re-modelling vs Medication effect(had Orthopedic surgery/On Amiodarone)   Consultants:  Orthopedics-Dr. Renato Gails Hematology-Telephone consulted Dr. Gaylyn Rong 9/20  ID-Telephone consulted Dr. Orvan Falconer 9/20 Cardiology-EP-Dr. Ladona Ridgel  Procedures:  Status post right hip replacement and intramedullary nailing 06/13/2012  Chest x-ray 1 view 9/18 = low lung volumes with vascular crowding and atelectasis, mild congestion and bronchitic changes no edema or effusions  Chest x-ray 1 view 9/19 = mild bibasilar opacities without significant interval change, fever atelectasis crease vascular congestion bronchitic change  V/q Scan 9/21 was neg for PE  Antibiotics:  Ancef 9.17.13>>>9/20  Vanc and Zosyn 9/20>>>>9/21  Cultures all neg on day of d/c 9/24  Discharge Exam: Filed Vitals:   06/19/12 1600 06/19/12 2000 06/19/12 2100 06/20/12 0549  BP:   146/56 135/58  Pulse: 81  82 64  Temp: 100.8 F (38.2 C)  98.4 F (36.9 C) 97.6 F (36.4 C)  TempSrc:   Oral Oral  Resp: 18 20 20 18   Height:      Weight:      SpO2: 93% 95% 95% 98%   General: Elderly female lying in bed in no acute distress, no pallor or icterus  HEENT: No pallor, moist oral mucosa,  no JVD  Cardiovascular: S1 S2 no murmurs rub or gallop  Respiratory: Breaths Sounds bilaterally decreased -no rales, crackles  Abdomen: soft ,non tender, non-distended bowel sounds present Foley in place  Extremities:bandages over right lateral hip and leg-Reinforced Bandage in the R hip region   Discharge Instructions  Follow-up Information    Call Ignatius Specking., MD.   Contact  information:   [address] [place] [address] [phone]       Follow up with Cammy Copa, MD. Schedule an appointment as soon as possible for a visit in 10 days.   Contact information:   [facility] [address] [place] [address] [phone]       Follow up with Hillis Range, MD. Schedule an appointment as soon as possible for a visit in 2 weeks.   Contact information:   [address] [place] [address] [phone]          Discharge Orders    Future Appointments: Provider: Department: Dept Phone: Center:   06/20/2012 10:30 AM [facility] Maryruth Bun [phone] [facility]     Future Orders Please Complete By Expires   Diet - low sodium heart healthy      Increase activity slowly      Call MD for:  persistant nausea and vomiting      Call MD for:  severe uncontrolled pain      Call MD for:  redness, tenderness, or signs of infection (pain, swelling, redness, odor or green/yellow discharge around incision site)      Call MD for:  difficulty breathing, headache or visual disturbances      Call MD for:  extreme fatigue          Medication List     As of 06/20/2012 10:12 AM    STOP taking these medications         cholecalciferol 1000 UNITS tablet   Commonly known as: VITAMIN D      ezetimibe 10 MG tablet   Commonly known as: ZETIA      fish oil-omega-3 fatty acids 1000 MG capsule      rosuvastatin 5 MG tablet   Commonly known as: CRESTOR      warfarin 2.5 MG tablet   Commonly known as: COUMADIN      TAKE these medications         amiodarone 200 MG tablet   Commonly known as: PACERONE   Take 100 mg by mouth daily.      amLODipine 10 MG tablet   Commonly known as: NORVASC   Take 1 tablet (10 mg total) by mouth daily.      ferrous sulfate 325 (65 FE) MG tablet   Take 325 mg by mouth daily with breakfast.      HYDROcodone-acetaminophen 5-325 MG per tablet   Commonly  known as: NORCO/VICODIN   Take 1-2 tablets by mouth every 6 (six) hours as needed for pain.      isosorbide mononitrate 30 MG CR tablet   Commonly known as: IMDUR   Take 30 mg by mouth. Take 1/2 by mouth daily      levothyroxine 75 MCG tablet   Commonly known as: SYNTHROID, LEVOTHROID   Take 75 mcg by mouth daily.      metoprolol tartrate 25 MG tablet   Commonly known as: LOPRESSOR   Take 25 mg by mouth 2 (two) times daily.      pantoprazole 40 MG tablet   Commonly  known as: PROTONIX   Take 40 mg by mouth daily.      raloxifene 60 MG tablet   Commonly known as: EVISTA   Take 60 mg by mouth daily.            The results of significant diagnostics from this hospitalization (including imaging, microbiology, ancillary and laboratory) are listed below for reference.    Significant Diagnostic Studies: Dg Chest 1 View  06/15/2012  *RADIOLOGY REPORT*  Clinical Data: Pneumonia  CHEST - 1 VIEW  Comparison: 06/14/2012  Findings: Heart size upper normal.  Central vascular fullness has decreased slightly in the interval. Hypoaeration with interstitial and vascular crowding. Mild peribronchial thickening , decreased in the interval.  Mild bibasilar opacities.  Right paratracheal fullness is similar to priors dating back to 2009, likely vasculature.  No pneumothorax.  No definite pleural effusion.  Left chest wall battery pack with unchanged lead tip positioning. Aortic atherosclerotic calcification.  IMPRESSION: Mild bibasilar opacities are without significant interval change. Favor atelectasis.  Decreased vascular congestion and bronchitic change.   Original Report Authenticated By: Waneta Martins, M.D.    Dg Chest 1 View  06/14/2012  *RADIOLOGY REPORT*  Clinical Data: Bronchitis.  Postoperative fever.  CHEST - 1 VIEW  Comparison: Chest x-ray 05/15/2014 3013.  Findings: The pacer wires are stable.  The heart is normal in size. The mediastinal and hilar contours are stable.  Low lung  volumes with vascular crowding and bibasilar atelectasis.  Increased interstitial markings and mild central vascular congestion without overt pulmonary edema or pleural effusion.  IMPRESSION:  1.  Low lung volumes with vascular crowding and atelectasis. 2.  Mild vascular congestion and bronchitic changes but no edema or effusions.   Original Report Authenticated By: P. Loralie Champagne, M.D.    Dg Chest 2 View  06/11/2012  *RADIOLOGY REPORT*  Clinical Data: Fall today.  Right femur fracture.  CHEST - 2 VIEW  Comparison: 06/02/2012.  Findings: 1451 hours. Left subclavian pacemaker leads are unchanged.  Heart size and mediastinal contours are stable allowing for lordotic positioning on the current examination.  Right paratracheal prominence is unchanged and likely vascular.  There are possible calcified right hilar lymph nodes.  The lungs are clear.  There is no pleural effusion or pneumothorax.  No acute fractures are seen.  IMPRESSION: No acute cardiopulmonary process.   Original Report Authenticated By: Gerrianne Scale, M.D.    Dg Hip Complete Right  06/11/2012  *RADIOLOGY REPORT*  Clinical Data: Fall.  Right hip pain.  RIGHT HIP - COMPLETE 2+ VIEW  Comparison: None.  Findings: Reverse trochanteric fracture noted on the right with a mildly comminuted spiral fragment.  Dominant fragments accordingly include the femoral head and neck fragment with the greater trochanters; and intermediate fragment including the lesser trochanter, and the distal shaft fragment.  Apex anterior angulation noted.  IMPRESSION:  1.  Mildly comminuted reverse trochanteric fracture with spiral component.   Original Report Authenticated By: Dellia Cloud, M.D.    Dg Femur Right  06/13/2012  *RADIOLOGY REPORT*  Clinical Data: Fracture fixation.  DG C-ARM 61-120 MIN - NRPT [facility], RIGHT FEMUR - 2 VIEW  Comparison: Plain films 06/11/2012.  Findings: We are provided with five fluoroscopic spot views of the right hip and femur.   Images demonstrate placement dynamic hip screw and long IM nail for fixation of an intertrochanteric fracture.  Four cerclage wires are present about the proximal femur and a single distal interlocking screw is in place.  Position and alignment appear near anatomic.  No new abnormality.  IMPRESSION: ORIF right femur fracture.   Original Report Authenticated By: Bernadene Bell. D'ALESSIO, M.D.    Dg Femur Right  06/11/2012  *RADIOLOGY REPORT*  Clinical Data: Right hip fracture.  RIGHT FEMUR - 2 VIEW  Comparison: 06/11/2012  Findings: The bottom margin of the spiral fracture with reverse trochanteric component is visible.  The distal femoral shaft appears intact.  IMPRESSION:  1.  Proximal femoral fracture includes a reverse trochanteric component and spiral component.  No distal fracture observed.   Original Report Authenticated By: Dellia Cloud, M.D.    Dg Pelvis Portable  06/13/2012  *RADIOLOGY REPORT*  Clinical Data: Status post fracture fixation.  PORTABLE PELVIS  Comparison: Plain films 06/11/2012.  Findings: The patient has a new dynamic hip screw and long IM nail for fixation of a proximal femur fracture.  For cerclage wires are identified.  There is no acute fracture.  Both hips are located. Gas in the soft tissues from surgery noted.  IMPRESSION: Status post ORIF of a right femur fracture without evidence of complication.   Original Report Authenticated By: Bernadene Bell. D'ALESSIO, M.D.    Nm Pulmonary Perf And Vent  06/17/2012  *RADIOLOGY REPORT*  Clinical Data:  Multiple risk factors for pulmonary embolism including atrial fibrillation, orthopedic surgery, evaluate for pulmonary embolism  NUCLEAR MEDICINE VENTILATION - PERFUSION LUNG SCAN  Technique:  Wash-in, equilibrium, and wash-out phase ventilation images were obtained using Xe-133 gas.  Perfusion images were obtained in multiple projections after intravenous injection of Tc- 28m MAA.  Radiopharmaceuticals:  20 mCi Xe-133 gas and 3 mCi Tc-16m  MAA.  Comparison:  Chest radiograph - 06/15/2012  Findings:  Review of recent chest radiograph demonstrates borderline enlarged cardiac silhouette and mediastinal contours.  Left anterior chest wall dual lead pacemaker.  Mild elevation of the right hemidiaphragm.  Minimal basilar opacities, left greater than right, favored to represent atelectasis.  Ventilatory images: Mild diffuse retention of inhaled radiotracers suggestive of airways disease.  There is an expected photopenic defect at the location of the left anterior chest wall pacemaker power pack.  Perfusion images: Homogeneous distribution of injected radiotracer. No segmental or subsegmental mismatched perfusion defects to suggest pulmonary embolism.  There is expected photopenic defect at the site of the left anterior chest wall pacer power pack.  IMPRESSION: 1.  Pulmonary embolism absent (very low probability for pulmonary embolism). 2.  Mild diffuse retention of inhaled radiotracer suggestive of airways disease.   Original Report Authenticated By: Waynard Reeds, M.D.    Dg C-arm 61-120 Min-no Report  06/13/2012  *RADIOLOGY REPORT*  Clinical Data: Fracture fixation.  DG C-ARM 61-120 MIN - [facility], RIGHT FEMUR - 2 VIEW  Comparison: Plain films 06/11/2012.  Findings: We are provided with five fluoroscopic spot views of the right hip and femur.  Images demonstrate placement dynamic hip screw and long IM nail for fixation of an intertrochanteric fracture.  Four cerclage wires are present about the proximal femur and a single distal interlocking screw is in place.  Position and alignment appear near anatomic.  No new abnormality.  IMPRESSION: ORIF right femur fracture.   Original Report Authenticated By: Bernadene Bell. Maricela Curet, M.D.     Microbiology: Recent Results (from the past 240 hour(s))  MRSA PCR SCREENING     Status: Normal   Collection Time   06/12/12 12:22 PM      Component Value Range Status Comment   MRSA by PCR  NEGATIVE  NEGATIVE Final     SURGICAL PCR SCREEN     Status: Normal   Collection Time   06/13/12  1:52 PM      Component Value Range Status Comment   MRSA, PCR NEGATIVE  NEGATIVE Final    Staphylococcus aureus NEGATIVE  NEGATIVE Final   URINE CULTURE     Status: Normal   Collection Time   06/14/12 10:45 AM      Component Value Range Status Comment   Specimen Description URINE, CATHETERIZED   Final    Special Requests NONE   Final    Culture  Setup Time 06/15/2012 01:35   Final    Colony Count NO GROWTH   Final    Culture NO GROWTH   Final    Report Status 06/16/2012 FINAL   Final   CULTURE, BLOOD (ROUTINE X 2)     Status: Normal   Collection Time   06/14/12 12:15 PM      Component Value Range Status Comment   Specimen Description BLOOD LEFT ARM   Final    Special Requests BOTTLES DRAWN AEROBIC ONLY 4CC   Final    Culture  Setup Time 06/14/2012 20:41   Final    Culture NO GROWTH 5 DAYS   Final    Report Status 06/20/2012 FINAL   Final   CULTURE, BLOOD (ROUTINE X 2)     Status: Normal   Collection Time   06/14/12 12:20 PM      Component Value Range Status Comment   Specimen Description BLOOD RIGHT HAND   Final    Special Requests BOTTLES DRAWN AEROBIC ONLY 3CC   Final    Culture  Setup Time 06/14/2012 20:41   Final    Culture NO GROWTH 5 DAYS   Final    Report Status 06/20/2012 FINAL   Final   CULTURE, BLOOD (ROUTINE X 2)     Status: Normal (Preliminary result)   Collection Time   06/16/12  1:30 PM      Component Value Range Status Comment   Specimen Description BLOOD RIGHT ARM   Final    Special Requests BOTTLES DRAWN AEROBIC AND ANAEROBIC 10CC   Final    Culture  Setup Time 06/16/2012 21:50   Final    Culture     Final    Value:        BLOOD CULTURE RECEIVED NO GROWTH TO DATE CULTURE WILL BE HELD FOR 5 DAYS BEFORE ISSUING A FINAL NEGATIVE REPORT   Report Status PENDING   Incomplete   CULTURE, BLOOD (ROUTINE X 2)     Status: Normal (Preliminary result)   Collection Time   06/16/12  1:45 PM       Component Value Range Status Comment   Specimen Description BLOOD RIGHT HAND   Final    Special Requests BOTTLES DRAWN AEROBIC AND ANAEROBIC 10CC   Final    Culture  Setup Time 06/16/2012 21:49   Final    Culture     Final    Value:        BLOOD CULTURE RECEIVED NO GROWTH TO DATE CULTURE WILL BE HELD FOR 5 DAYS BEFORE ISSUING A FINAL NEGATIVE REPORT   Report Status PENDING   Incomplete      Labs: Basic Metabolic Panel:  Lab 06/20/12 1610 06/19/12 0333 06/18/12 1637 06/18/12 0338 06/17/12 0335  NA 131* 131* 132* 132* 127*  K 3.7 3.8 3.9 3.7 3.9  CL 98 98 100 100 95*  CO2 25 24 25 24 25   GLUCOSE 122* 116* 163* 108* 111*  BUN 15 16 18 21  24*  CREATININE 0.68 0.71 0.80 0.82 1.13*  CALCIUM 9.6 9.5 9.5 9.4 9.5  MG -- -- 1.9 -- --  PHOS -- -- -- -- --   Liver Function Tests:  Lab 06/18/12 0338 06/16/12 0841 06/15/12 0315  AST 42* 63* 32  ALT 36* 32 16  ALKPHOS 92 80 40  BILITOT 0.8 0.6 0.4  PROT 5.1* 5.2* 4.7*  ALBUMIN 1.6* 1.6* 1.6*   No results found for this basename: LIPASE:5,AMYLASE:5 in the last 168 hours No results found for this basename: AMMONIA:5 in the last 168 hours CBC:  Lab 06/20/12 0449 06/19/12 0333 06/18/12 0338 06/17/12 0335 06/16/12 2129 06/16/12 0841 06/16/12 0320 06/15/12 0315 06/14/12 1752 06/14/12 1220  WBC 7.4 7.9 8.1 10.0 -- -- 11.0* -- -- --  NEUTROABS -- -- 6.0 -- -- -- -- 7.9* 11.5* 11.3*  HGB 8.8* 9.0* 8.5* 8.5* 8.7* -- -- -- -- --  HCT 27.2* 27.1* 25.4* 24.9* 25.6* -- -- -- -- --  MCV 85.5 85.5 84.7 83.3 -- -- 85.0 -- -- --  PLT 139* 139* 120* 105* -- 101* -- -- -- --   Cardiac Enzymes: No results found for this basename: CKTOTAL:5,CKMB:5,CKMBINDEX:5,TROPONINI:5 in the last 168 hours BNP: BNP (last 3 results) No results found for this basename: PROBNP:3 in the last 8760 hours CBG:  Lab 06/18/12 1544 06/18/12 1203 06/18/12 0804 06/17/12 2244 06/17/12 1619  GLUCAP 148* 104* 106* 131* 139*    Time coordinating  discharge:70minutes  Signed:  Rhetta Mura  [facility] 06/20/2012, 9:50 AM

## 2012-06-20 NOTE — Plan of Care (Signed)
Problem: Discharge Progression Outcomes Goal: Ambulates safely using assistive device Outcome: Not Met (add Reason) Still needing 2person transfer assist,  Goal: Discharge plan in place and appropriate Outcome: Completed/Met Date Met:  06/20/12 Discharge packet prepared by CSW and given to EMS transporters.

## 2012-06-20 NOTE — Clinical Social Work Placement (Signed)
Clinical Social Work Department CLINICAL SOCIAL WORK PLACEMENT NOTE 06/20/2012  Patient:  Melanie [name], Melanie [name].  Account Number:  [id number] Admit date:  06/11/2012  Clinical Social Worker:  Vennie Homans, [place]  Date/time:  06/14/2012 12:00 M  Clinical Social Work is seeking post-discharge placement for this patient at the following level of care:   SKILLED NURSING   (*CSW will update this form in Epic as items are completed)   06/14/2012  Patient/family provided with [facility] Department of Clinical Social Work's list of facilities offering this level of care within the geographic area requested by the patient (or if unable, by the patient's family).  06/14/2012  Patient/family informed of their freedom to choose among providers that offer the needed level of care, that participate in Medicare, Medicaid or managed care program needed by the patient, have an available bed and are willing to accept the patient.  06/14/2012  Patient/family informed of [facility]' ownership interest in [facility], as well as of the fact that they are under no obligation to receive care at this facility.  PASARR submitted to EDS on 06/13/2012 PASARR number received from EDS on 06/13/2012  FL2 transmitted to all facilities in geographic area requested by pt/family on  06/13/2012 FL2 transmitted to all facilities within larger geographic area on   Patient informed that his/her managed care company has contracts with or will negotiate with  certain facilities, including the following:     Patient/family informed of bed offers received:  06/14/2012 Patient chooses bed at [facility] Physician recommends and patient chooses bed at    Patient to be transferred to  on   Patient to be transferred to facility by   The following physician request were entered in Epic:   Additional Comments: Pt completed paperwork at [facility].  Doreen Salvage, LCSWA ICU/Stepdown Clinical Social  Worker [facility] Cell [phone] Hours 8am-1200pm M-F

## 2012-06-20 NOTE — Care Management Note (Signed)
    Page 1 of 2   06/20/2012     4:25:56 PM   CARE MANAGEMENT NOTE 06/20/2012  Patient:  Melanie [name], Melanie [name].   Account Number:  [id number]  Date Initiated:  06/13/2012  Documentation initiated by:  Jiles Crocker  Subjective/Objective Assessment:   ADMITTED WITH RIGHT FEMUR FRACTURE     Action/Plan:   PCP: Ignatius Specking., MD  LIVES AT HOME ALONE; POSSIBLE NEED SHORT TERM SNF AT DISCHARGE; [facility] WORKER REFERRAL PLACED   Anticipated DC Date:  06/22/2012   Anticipated DC Plan:  SKILLED NURSING FACILITY  In-house referral  Clinical Social Worker      DC Planning Services  CM consult      Choice offered to / List presented to:             Status of service:  Completed, signed off Medicare Important Message given?  NA - LOS <3 / Initial given by admissions (If response is "NO", the following Medicare IM given date fields will be blank) Date Medicare IM given:   Date Additional Medicare IM given:    Discharge Disposition:  SKILLED NURSING FACILITY  Per UR Regulation:  Reviewed for med. necessity/level of care/duration of stay  If discussed at Long Length of Stay Meetings, dates discussed:    Comments:  [id number]/Rhonda Earlene Plater, RN, BSN, CCM: CHART REVIEWED AND UPDATED. Patient required to have pacemaker reset due to escape rythym and po amiodarone. NO DISCHARGE NEEDS PRESENT AT THIS TIME. CASE MANAGEMENT [phone]  [id number]/NWGNFA Earlene Plater, RN, BSN, CCM: CHART REVIEWED AND UPDATED. NO DISCHARGE NEEDS PRESENT AT THIS TIME. CASE MANAGEMENT [phone]   06/13/2012- Abelino Derrick RN, BSN, MHA

## 2012-06-20 NOTE — Progress Notes (Signed)
Physical Therapy Treatment Patient Details Name: Shanikia Kernodle MRN: [id number] DOB: Oct 26, 1925 Today's Date: 06/20/2012 Time: 6962-9528 PT Time Calculation (min): 28 min  PT Assessment / Plan / Recommendation Comments on Treatment Session  Assisted pt OOB to recliner then performed R LE TE's.  Pt plans to D/C tp [facility] for ST Rehab    Follow Up Recommendations  Skilled nursing facility    Barriers to Discharge        Equipment Recommendations  Defer to next venue    Recommendations for Other Services    Frequency Min 3X/week   Plan Discharge plan remains appropriate    Precautions / Restrictions Precautions Precautions: Fall Precaution Comments: support R leg to prevent too much knee flexion Restrictions Weight Bearing Restrictions: Yes RLE Weight Bearing: Touchdown weight bearing    Pertinent Vitals/Pain C/o 4/10 during TE's    Mobility  Bed Mobility Bed Mobility: Supine to Sit;Sitting - Scoot to Edge of Bed Supine to Sit: 1: +2 Total assist Supine to Sit: Patient Percentage: 40% Sitting - Scoot to Edge of Bed: 1: +2 Total assist Sitting - Scoot to Edge of Bed: Patient Percentage: 60% Details for Bed Mobility Assistance: Max assist to support R LE to decrease pain and increased time  Transfers Transfers: Sit to [employer] Sit to Stand: 1: +2 Total assist;From bed Sit to Stand: Patient Percentage: 30% Stand Pivot Transfers: 1: +2 Total assist Stand Pivot Transfers: Patient Percentage: 30% Details for Transfer Assistance: Pt unable to support own body weight @ TTWB with RW so asssited pt from bed to recliner using total assist "bear hug" 1/4 pivot to her L pt 30%    Exercises Total Joint Exercises Ankle Circles/Pumps: AROM;Both;10 reps;Supine Quad Sets: AROM;Both;10 reps;Supine Gluteal Sets: AROM;Both;10 reps;Supine Short Arc Quad: AAROM;Right;10 reps;Supine Heel Slides: AAROM;Right;10 reps;Supine Hip ABduction/ADduction: AAROM;Right;10  reps;Supine Straight Leg Raises: AAROM;Right;10 reps;Supine     Visit Information  Last PT Received On: 06/20/12 Assistance Needed: +2                   End of Session PT - End of Session Equipment Utilized During Treatment: Gait belt Activity Tolerance: Patient limited by fatigue;Patient limited by pain Patient left: in chair;with call bell/phone within reach Nurse Communication: Need for lift equipment   Felecia Shelling  PTA [facility]  Acute  Rehab Pager     [phone]

## 2012-06-21 DIAGNOSIS — I10 Essential (primary) hypertension: Secondary | ICD-10-CM | POA: Diagnosis not present

## 2012-06-21 DIAGNOSIS — R262 Difficulty in walking, not elsewhere classified: Secondary | ICD-10-CM | POA: Diagnosis not present

## 2012-06-21 DIAGNOSIS — I4891 Unspecified atrial fibrillation: Secondary | ICD-10-CM | POA: Diagnosis not present

## 2012-06-21 DIAGNOSIS — D696 Thrombocytopenia, unspecified: Secondary | ICD-10-CM | POA: Diagnosis not present

## 2012-06-22 LAB — CULTURE, BLOOD (ROUTINE X 2): Culture: NO GROWTH

## 2012-06-28 DIAGNOSIS — N390 Urinary tract infection, site not specified: Secondary | ICD-10-CM | POA: Diagnosis not present

## 2012-07-11 DIAGNOSIS — R109 Unspecified abdominal pain: Secondary | ICD-10-CM | POA: Diagnosis not present

## 2012-07-18 ENCOUNTER — Encounter: Payer: Self-pay | Admitting: Cardiovascular Disease

## 2012-07-18 DIAGNOSIS — E039 Hypothyroidism, unspecified: Secondary | ICD-10-CM | POA: Diagnosis not present

## 2012-07-18 DIAGNOSIS — R55 Syncope and collapse: Secondary | ICD-10-CM | POA: Diagnosis not present

## 2012-07-18 DIAGNOSIS — I679 Cerebrovascular disease, unspecified: Secondary | ICD-10-CM | POA: Diagnosis not present

## 2012-07-18 DIAGNOSIS — I2510 Atherosclerotic heart disease of native coronary artery without angina pectoris: Secondary | ICD-10-CM | POA: Diagnosis not present

## 2012-07-18 DIAGNOSIS — I10 Essential (primary) hypertension: Secondary | ICD-10-CM | POA: Diagnosis not present

## 2012-07-18 DIAGNOSIS — Z950 Presence of cardiac pacemaker: Secondary | ICD-10-CM | POA: Diagnosis not present

## 2012-07-18 DIAGNOSIS — I4891 Unspecified atrial fibrillation: Secondary | ICD-10-CM | POA: Diagnosis not present

## 2012-07-18 DIAGNOSIS — I517 Cardiomegaly: Secondary | ICD-10-CM | POA: Diagnosis not present

## 2012-07-19 ENCOUNTER — Ambulatory Visit: Payer: Self-pay | Admitting: *Deleted

## 2012-07-19 DIAGNOSIS — R55 Syncope and collapse: Secondary | ICD-10-CM | POA: Diagnosis not present

## 2012-07-19 DIAGNOSIS — Z7901 Long term (current) use of anticoagulants: Secondary | ICD-10-CM

## 2012-07-19 DIAGNOSIS — I4891 Unspecified atrial fibrillation: Secondary | ICD-10-CM

## 2012-07-19 DIAGNOSIS — I2510 Atherosclerotic heart disease of native coronary artery without angina pectoris: Secondary | ICD-10-CM | POA: Diagnosis not present

## 2012-07-20 DIAGNOSIS — I97190 Other postprocedural cardiac functional disturbances following cardiac surgery: Secondary | ICD-10-CM | POA: Diagnosis not present

## 2012-07-20 DIAGNOSIS — R109 Unspecified abdominal pain: Secondary | ICD-10-CM | POA: Diagnosis not present

## 2012-07-20 DIAGNOSIS — R55 Syncope and collapse: Secondary | ICD-10-CM | POA: Diagnosis not present

## 2012-07-20 DIAGNOSIS — I2510 Atherosclerotic heart disease of native coronary artery without angina pectoris: Secondary | ICD-10-CM | POA: Diagnosis not present

## 2012-07-20 DIAGNOSIS — I10 Essential (primary) hypertension: Secondary | ICD-10-CM | POA: Diagnosis not present

## 2012-07-20 DIAGNOSIS — D649 Anemia, unspecified: Secondary | ICD-10-CM | POA: Diagnosis not present

## 2012-07-20 DIAGNOSIS — I4891 Unspecified atrial fibrillation: Secondary | ICD-10-CM | POA: Diagnosis not present

## 2012-07-20 DIAGNOSIS — I499 Cardiac arrhythmia, unspecified: Secondary | ICD-10-CM | POA: Diagnosis not present

## 2012-07-20 DIAGNOSIS — S7223XA Displaced subtrochanteric fracture of unspecified femur, initial encounter for closed fracture: Secondary | ICD-10-CM | POA: Diagnosis not present

## 2012-07-20 DIAGNOSIS — R110 Nausea: Secondary | ICD-10-CM | POA: Diagnosis not present

## 2012-07-20 DIAGNOSIS — K219 Gastro-esophageal reflux disease without esophagitis: Secondary | ICD-10-CM | POA: Diagnosis not present

## 2012-07-20 DIAGNOSIS — E039 Hypothyroidism, unspecified: Secondary | ICD-10-CM | POA: Diagnosis not present

## 2012-07-20 DIAGNOSIS — Z950 Presence of cardiac pacemaker: Secondary | ICD-10-CM | POA: Diagnosis not present

## 2012-07-20 DIAGNOSIS — M810 Age-related osteoporosis without current pathological fracture: Secondary | ICD-10-CM | POA: Diagnosis not present

## 2012-07-20 DIAGNOSIS — G2581 Restless legs syndrome: Secondary | ICD-10-CM | POA: Diagnosis not present

## 2012-07-20 DIAGNOSIS — K5900 Constipation, unspecified: Secondary | ICD-10-CM | POA: Diagnosis not present

## 2012-07-25 ENCOUNTER — Encounter: Payer: Self-pay | Admitting: *Deleted

## 2012-07-27 DIAGNOSIS — S7223XA Displaced subtrochanteric fracture of unspecified femur, initial encounter for closed fracture: Secondary | ICD-10-CM | POA: Diagnosis not present

## 2012-07-31 ENCOUNTER — Encounter: Payer: Self-pay | Admitting: Internal Medicine

## 2012-07-31 ENCOUNTER — Ambulatory Visit (INDEPENDENT_AMBULATORY_CARE_PROVIDER_SITE_OTHER): Payer: Medicare Other | Admitting: Internal Medicine

## 2012-07-31 VITALS — BP 134/70 | HR 66 | Ht 64.0 in | Wt 147.0 lb

## 2012-07-31 DIAGNOSIS — I4891 Unspecified atrial fibrillation: Secondary | ICD-10-CM

## 2012-07-31 DIAGNOSIS — Z950 Presence of cardiac pacemaker: Secondary | ICD-10-CM

## 2012-07-31 DIAGNOSIS — I495 Sick sinus syndrome: Secondary | ICD-10-CM

## 2012-07-31 LAB — PACEMAKER DEVICE OBSERVATION
AL IMPEDENCE PM: 511 Ohm
AL THRESHOLD: 0.5 V
ATRIAL PACING PM: 47
BATTERY VOLTAGE: 2.66 V
VENTRICULAR PACING PM: 0

## 2012-07-31 NOTE — Progress Notes (Signed)
PCP: Ignatius Specking., MD  Melanie [name] is a 77 y.o. female who presents today for routine electrophysiology followup.  Since last being seen in our clinic, she fell and fractured her hip.  She is making slow recovery and is now at the [facility].  Today, she denies symptoms of palpitations, chest pain, shortness of breath,  lower extremity edema, dizziness, presyncope, or syncope.  The patient is otherwise without complaint today.   Past Medical History  Diagnosis Date  . Vertigo   . Paroxysmal atrial fibrillation     on coumadin and amiodarone  . Sinus node dysfunction     s/p Medtronic dual-chamber pacemaker  . Hypercalcemia   . Hypertension   . Dyslipidemia   . CAD (coronary artery disease)     Two vessel, quiescent; 70% LAD aretery disease and well collateralized, 100% RCA by cardiac cath in 2004; NL LVF; low risk Cardioloite; EF 74%, 11/10  . Hypothyroidism    Past Surgical History  Procedure Date  . Pacemaker insertion 05/24/03    MDT Kappa    Current Outpatient Prescriptions  Medication Sig Dispense Refill  . acetaminophen (TYLENOL) 325 MG tablet Take 650 mg by mouth daily as needed.      Marland Kitchen aluminum & magnesium hydroxide-simethicone (MYLANTA) 500-450-40 MG/5ML suspension Take 15 mLs by mouth every 6 (six) hours as needed.      Marland Kitchen amiodarone (PACERONE) 200 MG tablet Take 100 mg by mouth daily.      Marland Kitchen amLODipine (NORVASC) 10 MG tablet Take 1 tablet (10 mg total) by mouth daily.  30 tablet  11  . docusate sodium (COLACE) 100 MG capsule Take 100 mg by mouth 2 (two) times daily as needed.      . isosorbide mononitrate (IMDUR) 30 MG CR tablet Take 30 mg by mouth. Take 1/2 by mouth daily      . levothyroxine (SYNTHROID, LEVOTHROID) 75 MCG tablet Take 75 mcg by mouth daily.        . metoprolol tartrate (LOPRESSOR) 25 MG tablet Take 25 mg by mouth 2 (two) times daily.       . pantoprazole (PROTONIX) 40 MG tablet Take 40 mg by mouth daily.        . promethazine (PHENERGAN) 25 MG  tablet Take 25 mg by mouth every 6 (six) hours as needed.      . raloxifene (EVISTA) 60 MG tablet Take 60 mg by mouth daily.        Marland Kitchen rOPINIRole (REQUIP) 1 MG tablet Take 1 mg by mouth at bedtime.        Physical Exam: Filed Vitals:   07/31/12 1326  BP: 134/70  Pulse: 66  Height: 5\' 4"  (1.626 m)  Weight: 147 lb (66.679 kg)    GEN- The patient is elderly appearing, alert and oriented x 3 today.   Head- normocephalic, atraumatic Eyes-  Sclera clear, conjunctiva pink Ears- hearing intact Oropharynx- clear Lungs- Clear to ausculation bilaterally, normal work of breathing Chest- pacemaker pocket is well healed Heart- Regular rate and rhythm, no murmurs, rubs or gallops, PMI not laterally displaced GI- soft, NT, ND, + BS Extremities- no clubbing, cyanosis, or edema  Pacemaker interrogation- reviewed in detail today,  See PACEART report  Assessment and Plan:  1. Bradycardia Normal pacemaker function See Arita Miss Art report No changes today The importance of monthly TTMs (battery approaching ERI) was stressed today Return to see Baxter Hire in 6 months  2. afib  Controlled with amiodarone She is presently not  on coumadin I will send a note to [facility] recommending that she restart coumadin if not containdicated

## 2012-07-31 NOTE — Patient Instructions (Signed)
   Monthly phone checks  Restart Coumadin if not contraindicated  Dr. Johney Frame - 6 month Continue all current medications.

## 2012-08-23 DIAGNOSIS — Z471 Aftercare following joint replacement surgery: Secondary | ICD-10-CM | POA: Diagnosis not present

## 2012-08-23 DIAGNOSIS — I4891 Unspecified atrial fibrillation: Secondary | ICD-10-CM | POA: Diagnosis not present

## 2012-08-23 DIAGNOSIS — I10 Essential (primary) hypertension: Secondary | ICD-10-CM | POA: Diagnosis not present

## 2012-08-23 DIAGNOSIS — R269 Unspecified abnormalities of gait and mobility: Secondary | ICD-10-CM | POA: Diagnosis not present

## 2012-08-23 DIAGNOSIS — Z96649 Presence of unspecified artificial hip joint: Secondary | ICD-10-CM | POA: Diagnosis not present

## 2012-08-25 DIAGNOSIS — I4891 Unspecified atrial fibrillation: Secondary | ICD-10-CM | POA: Diagnosis not present

## 2012-08-25 DIAGNOSIS — Z96649 Presence of unspecified artificial hip joint: Secondary | ICD-10-CM | POA: Diagnosis not present

## 2012-08-25 DIAGNOSIS — I10 Essential (primary) hypertension: Secondary | ICD-10-CM | POA: Diagnosis not present

## 2012-08-25 DIAGNOSIS — R269 Unspecified abnormalities of gait and mobility: Secondary | ICD-10-CM | POA: Diagnosis not present

## 2012-08-25 DIAGNOSIS — Z471 Aftercare following joint replacement surgery: Secondary | ICD-10-CM | POA: Diagnosis not present

## 2012-08-26 DIAGNOSIS — Z471 Aftercare following joint replacement surgery: Secondary | ICD-10-CM | POA: Diagnosis not present

## 2012-08-26 DIAGNOSIS — I10 Essential (primary) hypertension: Secondary | ICD-10-CM | POA: Diagnosis not present

## 2012-08-26 DIAGNOSIS — R269 Unspecified abnormalities of gait and mobility: Secondary | ICD-10-CM | POA: Diagnosis not present

## 2012-08-26 DIAGNOSIS — Z96649 Presence of unspecified artificial hip joint: Secondary | ICD-10-CM | POA: Diagnosis not present

## 2012-08-26 DIAGNOSIS — I4891 Unspecified atrial fibrillation: Secondary | ICD-10-CM | POA: Diagnosis not present

## 2012-08-28 DIAGNOSIS — I495 Sick sinus syndrome: Secondary | ICD-10-CM | POA: Diagnosis not present

## 2012-08-29 DIAGNOSIS — G2581 Restless legs syndrome: Secondary | ICD-10-CM | POA: Diagnosis not present

## 2012-08-29 DIAGNOSIS — Z96649 Presence of unspecified artificial hip joint: Secondary | ICD-10-CM | POA: Diagnosis not present

## 2012-08-29 DIAGNOSIS — I4891 Unspecified atrial fibrillation: Secondary | ICD-10-CM | POA: Diagnosis not present

## 2012-08-29 DIAGNOSIS — I10 Essential (primary) hypertension: Secondary | ICD-10-CM | POA: Diagnosis not present

## 2012-08-29 DIAGNOSIS — R269 Unspecified abnormalities of gait and mobility: Secondary | ICD-10-CM | POA: Diagnosis not present

## 2012-08-29 DIAGNOSIS — Z471 Aftercare following joint replacement surgery: Secondary | ICD-10-CM | POA: Diagnosis not present

## 2012-08-29 DIAGNOSIS — R110 Nausea: Secondary | ICD-10-CM | POA: Diagnosis not present

## 2012-08-31 DIAGNOSIS — Z96649 Presence of unspecified artificial hip joint: Secondary | ICD-10-CM | POA: Diagnosis not present

## 2012-08-31 DIAGNOSIS — R269 Unspecified abnormalities of gait and mobility: Secondary | ICD-10-CM | POA: Diagnosis not present

## 2012-08-31 DIAGNOSIS — Z471 Aftercare following joint replacement surgery: Secondary | ICD-10-CM | POA: Diagnosis not present

## 2012-08-31 DIAGNOSIS — I10 Essential (primary) hypertension: Secondary | ICD-10-CM | POA: Diagnosis not present

## 2012-08-31 DIAGNOSIS — I4891 Unspecified atrial fibrillation: Secondary | ICD-10-CM | POA: Diagnosis not present

## 2012-09-04 DIAGNOSIS — R269 Unspecified abnormalities of gait and mobility: Secondary | ICD-10-CM | POA: Diagnosis not present

## 2012-09-04 DIAGNOSIS — I4891 Unspecified atrial fibrillation: Secondary | ICD-10-CM | POA: Diagnosis not present

## 2012-09-04 DIAGNOSIS — Z96649 Presence of unspecified artificial hip joint: Secondary | ICD-10-CM | POA: Diagnosis not present

## 2012-09-04 DIAGNOSIS — Z471 Aftercare following joint replacement surgery: Secondary | ICD-10-CM | POA: Diagnosis not present

## 2012-09-04 DIAGNOSIS — I10 Essential (primary) hypertension: Secondary | ICD-10-CM | POA: Diagnosis not present

## 2012-09-06 DIAGNOSIS — I4891 Unspecified atrial fibrillation: Secondary | ICD-10-CM | POA: Diagnosis not present

## 2012-09-06 DIAGNOSIS — Z471 Aftercare following joint replacement surgery: Secondary | ICD-10-CM | POA: Diagnosis not present

## 2012-09-06 DIAGNOSIS — I10 Essential (primary) hypertension: Secondary | ICD-10-CM | POA: Diagnosis not present

## 2012-09-06 DIAGNOSIS — Z96649 Presence of unspecified artificial hip joint: Secondary | ICD-10-CM | POA: Diagnosis not present

## 2012-09-06 DIAGNOSIS — R269 Unspecified abnormalities of gait and mobility: Secondary | ICD-10-CM | POA: Diagnosis not present

## 2012-09-07 DIAGNOSIS — R110 Nausea: Secondary | ICD-10-CM | POA: Diagnosis not present

## 2012-09-11 DIAGNOSIS — Z471 Aftercare following joint replacement surgery: Secondary | ICD-10-CM | POA: Diagnosis not present

## 2012-09-11 DIAGNOSIS — I10 Essential (primary) hypertension: Secondary | ICD-10-CM | POA: Diagnosis not present

## 2012-09-11 DIAGNOSIS — Z96649 Presence of unspecified artificial hip joint: Secondary | ICD-10-CM | POA: Diagnosis not present

## 2012-09-11 DIAGNOSIS — I4891 Unspecified atrial fibrillation: Secondary | ICD-10-CM | POA: Diagnosis not present

## 2012-09-11 DIAGNOSIS — R269 Unspecified abnormalities of gait and mobility: Secondary | ICD-10-CM | POA: Diagnosis not present

## 2012-09-12 DIAGNOSIS — R269 Unspecified abnormalities of gait and mobility: Secondary | ICD-10-CM | POA: Diagnosis not present

## 2012-09-12 DIAGNOSIS — Z471 Aftercare following joint replacement surgery: Secondary | ICD-10-CM | POA: Diagnosis not present

## 2012-09-12 DIAGNOSIS — Z96649 Presence of unspecified artificial hip joint: Secondary | ICD-10-CM | POA: Diagnosis not present

## 2012-09-12 DIAGNOSIS — I10 Essential (primary) hypertension: Secondary | ICD-10-CM | POA: Diagnosis not present

## 2012-09-12 DIAGNOSIS — I4891 Unspecified atrial fibrillation: Secondary | ICD-10-CM | POA: Diagnosis not present

## 2012-09-13 DIAGNOSIS — Z96649 Presence of unspecified artificial hip joint: Secondary | ICD-10-CM | POA: Diagnosis not present

## 2012-09-13 DIAGNOSIS — I10 Essential (primary) hypertension: Secondary | ICD-10-CM | POA: Diagnosis not present

## 2012-09-13 DIAGNOSIS — R269 Unspecified abnormalities of gait and mobility: Secondary | ICD-10-CM | POA: Diagnosis not present

## 2012-09-13 DIAGNOSIS — I4891 Unspecified atrial fibrillation: Secondary | ICD-10-CM | POA: Diagnosis not present

## 2012-09-13 DIAGNOSIS — Z471 Aftercare following joint replacement surgery: Secondary | ICD-10-CM | POA: Diagnosis not present

## 2012-09-18 DIAGNOSIS — IMO0002 Reserved for concepts with insufficient information to code with codable children: Secondary | ICD-10-CM | POA: Diagnosis not present

## 2012-09-18 DIAGNOSIS — H109 Unspecified conjunctivitis: Secondary | ICD-10-CM | POA: Diagnosis not present

## 2012-09-18 DIAGNOSIS — I4891 Unspecified atrial fibrillation: Secondary | ICD-10-CM | POA: Diagnosis not present

## 2012-09-19 DIAGNOSIS — Z471 Aftercare following joint replacement surgery: Secondary | ICD-10-CM | POA: Diagnosis not present

## 2012-09-19 DIAGNOSIS — R269 Unspecified abnormalities of gait and mobility: Secondary | ICD-10-CM | POA: Diagnosis not present

## 2012-09-19 DIAGNOSIS — I10 Essential (primary) hypertension: Secondary | ICD-10-CM | POA: Diagnosis not present

## 2012-09-19 DIAGNOSIS — Z96649 Presence of unspecified artificial hip joint: Secondary | ICD-10-CM | POA: Diagnosis not present

## 2012-09-19 DIAGNOSIS — I4891 Unspecified atrial fibrillation: Secondary | ICD-10-CM | POA: Diagnosis not present

## 2012-09-21 DIAGNOSIS — I4891 Unspecified atrial fibrillation: Secondary | ICD-10-CM | POA: Diagnosis not present

## 2012-09-21 DIAGNOSIS — Z471 Aftercare following joint replacement surgery: Secondary | ICD-10-CM | POA: Diagnosis not present

## 2012-09-21 DIAGNOSIS — I10 Essential (primary) hypertension: Secondary | ICD-10-CM | POA: Diagnosis not present

## 2012-09-21 DIAGNOSIS — R269 Unspecified abnormalities of gait and mobility: Secondary | ICD-10-CM | POA: Diagnosis not present

## 2012-09-21 DIAGNOSIS — Z96649 Presence of unspecified artificial hip joint: Secondary | ICD-10-CM | POA: Diagnosis not present

## 2012-09-22 DIAGNOSIS — R269 Unspecified abnormalities of gait and mobility: Secondary | ICD-10-CM | POA: Diagnosis not present

## 2012-09-22 DIAGNOSIS — I4891 Unspecified atrial fibrillation: Secondary | ICD-10-CM | POA: Diagnosis not present

## 2012-09-22 DIAGNOSIS — I10 Essential (primary) hypertension: Secondary | ICD-10-CM | POA: Diagnosis not present

## 2012-09-22 DIAGNOSIS — Z471 Aftercare following joint replacement surgery: Secondary | ICD-10-CM | POA: Diagnosis not present

## 2012-09-22 DIAGNOSIS — Z96649 Presence of unspecified artificial hip joint: Secondary | ICD-10-CM | POA: Diagnosis not present

## 2012-09-26 DIAGNOSIS — Z96649 Presence of unspecified artificial hip joint: Secondary | ICD-10-CM | POA: Diagnosis not present

## 2012-09-26 DIAGNOSIS — Z471 Aftercare following joint replacement surgery: Secondary | ICD-10-CM | POA: Diagnosis not present

## 2012-09-26 DIAGNOSIS — I4891 Unspecified atrial fibrillation: Secondary | ICD-10-CM | POA: Diagnosis not present

## 2012-09-26 DIAGNOSIS — I10 Essential (primary) hypertension: Secondary | ICD-10-CM | POA: Diagnosis not present

## 2012-09-26 DIAGNOSIS — R269 Unspecified abnormalities of gait and mobility: Secondary | ICD-10-CM | POA: Diagnosis not present

## 2012-09-28 DIAGNOSIS — H109 Unspecified conjunctivitis: Secondary | ICD-10-CM | POA: Diagnosis not present

## 2012-09-28 DIAGNOSIS — M79609 Pain in unspecified limb: Secondary | ICD-10-CM | POA: Diagnosis not present

## 2012-09-28 DIAGNOSIS — R109 Unspecified abdominal pain: Secondary | ICD-10-CM | POA: Diagnosis not present

## 2012-09-28 DIAGNOSIS — M25559 Pain in unspecified hip: Secondary | ICD-10-CM | POA: Diagnosis not present

## 2012-10-02 ENCOUNTER — Encounter: Payer: Self-pay | Admitting: Internal Medicine

## 2012-10-02 DIAGNOSIS — I495 Sick sinus syndrome: Secondary | ICD-10-CM | POA: Diagnosis not present

## 2012-10-03 ENCOUNTER — Other Ambulatory Visit: Payer: Self-pay | Admitting: Cardiology

## 2012-10-03 DIAGNOSIS — H43819 Vitreous degeneration, unspecified eye: Secondary | ICD-10-CM | POA: Diagnosis not present

## 2012-10-03 DIAGNOSIS — H5710 Ocular pain, unspecified eye: Secondary | ICD-10-CM | POA: Diagnosis not present

## 2012-10-03 DIAGNOSIS — H43399 Other vitreous opacities, unspecified eye: Secondary | ICD-10-CM | POA: Diagnosis not present

## 2012-10-16 DIAGNOSIS — I4891 Unspecified atrial fibrillation: Secondary | ICD-10-CM | POA: Diagnosis not present

## 2012-10-19 DIAGNOSIS — H43399 Other vitreous opacities, unspecified eye: Secondary | ICD-10-CM | POA: Diagnosis not present

## 2012-10-26 DIAGNOSIS — H00029 Hordeolum internum unspecified eye, unspecified eyelid: Secondary | ICD-10-CM | POA: Diagnosis not present

## 2012-10-30 DIAGNOSIS — I495 Sick sinus syndrome: Secondary | ICD-10-CM

## 2012-11-13 DIAGNOSIS — Z961 Presence of intraocular lens: Secondary | ICD-10-CM | POA: Diagnosis not present

## 2012-11-13 DIAGNOSIS — H35379 Puckering of macula, unspecified eye: Secondary | ICD-10-CM | POA: Diagnosis not present

## 2012-11-13 DIAGNOSIS — K219 Gastro-esophageal reflux disease without esophagitis: Secondary | ICD-10-CM | POA: Diagnosis not present

## 2012-11-13 DIAGNOSIS — I4891 Unspecified atrial fibrillation: Secondary | ICD-10-CM | POA: Diagnosis not present

## 2012-11-13 DIAGNOSIS — H43399 Other vitreous opacities, unspecified eye: Secondary | ICD-10-CM | POA: Diagnosis not present

## 2012-11-13 DIAGNOSIS — H524 Presbyopia: Secondary | ICD-10-CM | POA: Diagnosis not present

## 2012-11-24 DIAGNOSIS — L03019 Cellulitis of unspecified finger: Secondary | ICD-10-CM | POA: Diagnosis not present

## 2012-11-24 DIAGNOSIS — M79609 Pain in unspecified limb: Secondary | ICD-10-CM | POA: Diagnosis not present

## 2012-11-27 DIAGNOSIS — I495 Sick sinus syndrome: Secondary | ICD-10-CM | POA: Diagnosis not present

## 2012-12-13 DIAGNOSIS — B351 Tinea unguium: Secondary | ICD-10-CM | POA: Diagnosis not present

## 2012-12-13 DIAGNOSIS — I4891 Unspecified atrial fibrillation: Secondary | ICD-10-CM | POA: Diagnosis not present

## 2013-01-01 ENCOUNTER — Encounter: Payer: Self-pay | Admitting: Internal Medicine

## 2013-01-01 DIAGNOSIS — I495 Sick sinus syndrome: Secondary | ICD-10-CM | POA: Diagnosis not present

## 2013-01-10 DIAGNOSIS — I4891 Unspecified atrial fibrillation: Secondary | ICD-10-CM | POA: Diagnosis not present

## 2013-01-21 ENCOUNTER — Other Ambulatory Visit: Payer: Self-pay | Admitting: Physician Assistant

## 2013-01-29 DIAGNOSIS — Z950 Presence of cardiac pacemaker: Secondary | ICD-10-CM | POA: Diagnosis not present

## 2013-01-29 DIAGNOSIS — I495 Sick sinus syndrome: Secondary | ICD-10-CM

## 2013-01-29 DIAGNOSIS — I4891 Unspecified atrial fibrillation: Secondary | ICD-10-CM | POA: Diagnosis not present

## 2013-02-07 ENCOUNTER — Encounter (HOSPITAL_COMMUNITY): Payer: Self-pay | Admitting: Pharmacy Technician

## 2013-02-07 ENCOUNTER — Encounter: Payer: Self-pay | Admitting: Internal Medicine

## 2013-02-07 ENCOUNTER — Other Ambulatory Visit: Payer: Self-pay | Admitting: *Deleted

## 2013-02-07 ENCOUNTER — Telehealth: Payer: Self-pay | Admitting: Internal Medicine

## 2013-02-07 ENCOUNTER — Ambulatory Visit (INDEPENDENT_AMBULATORY_CARE_PROVIDER_SITE_OTHER): Payer: Medicare Other | Admitting: Internal Medicine

## 2013-02-07 ENCOUNTER — Encounter: Payer: Self-pay | Admitting: *Deleted

## 2013-02-07 VITALS — BP 124/75 | HR 65 | Ht 63.5 in | Wt 155.1 lb

## 2013-02-07 DIAGNOSIS — I495 Sick sinus syndrome: Secondary | ICD-10-CM | POA: Diagnosis not present

## 2013-02-07 DIAGNOSIS — I10 Essential (primary) hypertension: Secondary | ICD-10-CM | POA: Diagnosis not present

## 2013-02-07 DIAGNOSIS — Z7901 Long term (current) use of anticoagulants: Secondary | ICD-10-CM | POA: Diagnosis not present

## 2013-02-07 DIAGNOSIS — I4891 Unspecified atrial fibrillation: Secondary | ICD-10-CM | POA: Diagnosis not present

## 2013-02-07 DIAGNOSIS — Z950 Presence of cardiac pacemaker: Secondary | ICD-10-CM

## 2013-02-07 LAB — PACEMAKER DEVICE OBSERVATION

## 2013-02-07 NOTE — Telephone Encounter (Signed)
Generator change out - Monday, 5/19 Melanie [name] - 11:00 Checking percert

## 2013-02-07 NOTE — Telephone Encounter (Signed)
No precert required 

## 2013-02-07 NOTE — Patient Instructions (Addendum)
   Generator change - see info sheet given  Continue all current medications. Follow up given after procedure above

## 2013-02-11 MED ORDER — GENTAMICIN SULFATE 40 MG/ML IJ SOLN
80.0000 mg | INTRAMUSCULAR | Status: DC
  Filled 2013-02-11: qty 2

## 2013-02-11 MED ORDER — CEFAZOLIN SODIUM-DEXTROSE 2-3 GM-% IV SOLR
2.0000 g | INTRAVENOUS | Status: DC
  Filled 2013-02-11: qty 50

## 2013-02-11 NOTE — Progress Notes (Signed)
PCP: VYAS,DHRUV B., MD   Martyna E. Melanie [name] is a 79 y.o. female who presents today for routine electrophysiology followup.  Since last being seen in our clinic, she has done well.  She recently sent TTM to our office which revealed ERI battery status.  She is therefore added to our schedule today.  She reports increased fatigue and decreased exercise tolerance with ERI conversion (for about a month).    Today, she denies symptoms of palpitations, chest pain, shortness of breath,  lower extremity edema, dizziness, presyncope, or syncope.  The patient is otherwise without complaint today.   Past Medical History  Diagnosis Date  . Vertigo   . Paroxysmal atrial fibrillation     on coumadin and amiodarone  . Sinus node dysfunction     s/p Medtronic dual-chamber pacemaker  . Hypercalcemia   . Hypertension   . Dyslipidemia   . CAD (coronary artery disease)     Two vessel, quiescent; 70% LAD aretery disease and well collateralized, 100% RCA by cardiac cath in 2004; NL LVF; low risk Cardioloite; EF 74%, 11/10  . Hypothyroidism    Past Surgical History  Procedure Laterality Date  . Pacemaker insertion  05/24/03    MDT Kappa    Current Outpatient Prescriptions  Medication Sig Dispense Refill  . amLODipine (NORVASC) 10 MG tablet Take 5 mg by mouth daily.      . clonazePAM (KLONOPIN) 0.5 MG tablet Take 0.5 mg by mouth daily.      . ezetimibe (ZETIA) 10 MG tablet Take 10 mg by mouth daily.      . isosorbide mononitrate (IMDUR) 30 MG CR tablet Take 15 mg by mouth daily.       . pantoprazole (PROTONIX) 40 MG tablet Take 40 mg by mouth 2 (two) times daily.       . raloxifene (EVISTA) 60 MG tablet Take 60 mg by mouth daily.        . rosuvastatin (CRESTOR) 10 MG tablet Take 10 mg by mouth at bedtime.      . warfarin (COUMADIN) 2 MG tablet Take 2 mg by mouth daily.      . amiodarone (PACERONE) 200 MG tablet Take 100 mg by mouth daily.      . levothyroxine (SYNTHROID, LEVOTHROID) 88 MCG tablet Take 88 mcg  by mouth daily before breakfast.      . metoprolol (LOPRESSOR) 50 MG tablet Take 25 mg by mouth 2 (two) times daily.      . OVER THE COUNTER MEDICATION Apply 1-2 drops topically 2 (two) times daily as needed (Lamisil Drops. Apply to fingers for fungal infection.).        No current facility-administered medications for this visit.   Facility-Administered Medications Ordered in Other Visits  Medication Dose Route Frequency Provider Last Rate Last Dose  . [START ON 02/12/2013] ceFAZolin (ANCEF) IVPB 2 g/50 mL premix  2 g Intravenous On Call Steven C Klein, MD      . [START ON 02/12/2013] gentamicin (GARAMYCIN) 80 mg in sodium chloride irrigation 0.9 % 500 mL irrigation  80 mg Irrigation On Call Steven C Klein, MD        Physical Exam: Filed Vitals:   02/07/13 1150  BP: 124/75  Pulse: 65  Height: 5' 3.5" (1.613 m)  Weight: 155 lb 1.3 oz (70.344 kg)    GEN- The patient is elderly appearing, alert and oriented x 3 today.   Head- normocephalic, atraumatic Eyes-  Sclera clear, conjunctiva pink Ears- hearing intact Oropharynx-   clear Lungs- Clear to ausculation bilaterally, normal work of breathing Chest- pacemaker pocket is well healed Heart- Regular rate and rhythm, no murmurs, rubs or gallops, PMI not laterally displaced GI- soft, NT, ND, + BS Extremities- no clubbing, cyanosis, or edema  Pacemaker interrogation- reviewed in detail today,  See PACEART report  Assessment and Plan:  1. Symptomatic Bradycardia/ SSS She has reached ERI battery status 01/02/13.  She requires generator change at this time. She prefers that Dr Klein perform the procedure as he placed her PPM initially.  Risks, benefits, and alternatives to PPM generator change were discussed at length with the patient who wishes to proceed.  We will therefore schedule generator change at the next available time.  2. afib  Controlled with amiodarone She is presently on coumadin She will hold coumadin for 48 hours prior to  her generator change procedure  

## 2013-02-12 ENCOUNTER — Encounter (HOSPITAL_COMMUNITY)
Admission: RE | Disposition: A | Discharge status: Home / Self Care | Payer: Self-pay | Source: Ambulatory Visit | Attending: Internal Medicine

## 2013-02-12 ENCOUNTER — Ambulatory Visit (HOSPITAL_COMMUNITY)
Admission: RE | Admit: 2013-02-12 | Discharge: 2013-02-12 | Disposition: A | Discharge status: Home / Self Care | Payer: Medicare Other | Source: Ambulatory Visit | Attending: Internal Medicine | Admitting: Internal Medicine

## 2013-02-12 DIAGNOSIS — I10 Essential (primary) hypertension: Secondary | ICD-10-CM | POA: Insufficient documentation

## 2013-02-12 DIAGNOSIS — E039 Hypothyroidism, unspecified: Secondary | ICD-10-CM | POA: Insufficient documentation

## 2013-02-12 DIAGNOSIS — I2510 Atherosclerotic heart disease of native coronary artery without angina pectoris: Secondary | ICD-10-CM | POA: Insufficient documentation

## 2013-02-12 DIAGNOSIS — Z79899 Other long term (current) drug therapy: Secondary | ICD-10-CM | POA: Insufficient documentation

## 2013-02-12 DIAGNOSIS — I495 Sick sinus syndrome: Secondary | ICD-10-CM

## 2013-02-12 DIAGNOSIS — Z7901 Long term (current) use of anticoagulants: Secondary | ICD-10-CM | POA: Insufficient documentation

## 2013-02-12 DIAGNOSIS — I4891 Unspecified atrial fibrillation: Secondary | ICD-10-CM | POA: Insufficient documentation

## 2013-02-12 DIAGNOSIS — R42 Dizziness and giddiness: Secondary | ICD-10-CM | POA: Insufficient documentation

## 2013-02-12 DIAGNOSIS — Z950 Presence of cardiac pacemaker: Secondary | ICD-10-CM

## 2013-02-12 DIAGNOSIS — Z45018 Encounter for adjustment and management of other part of cardiac pacemaker: Secondary | ICD-10-CM | POA: Insufficient documentation

## 2013-02-12 DIAGNOSIS — E785 Hyperlipidemia, unspecified: Secondary | ICD-10-CM | POA: Insufficient documentation

## 2013-02-12 HISTORY — PX: PACEMAKER GENERATOR CHANGE: SHX5481

## 2013-02-12 LAB — CBC
HCT: 36.1 % (ref 36.0–46.0)
MCHC: 33.2 g/dL (ref 30.0–36.0)
MCV: 91.6 fL (ref 78.0–100.0)
RDW: 13.9 % (ref 11.5–15.5)

## 2013-02-12 LAB — BASIC METABOLIC PANEL
BUN: 15 mg/dL (ref 6–23)
Creatinine, Ser: 1.04 mg/dL (ref 0.50–1.10)
GFR calc Af Amer: 56 mL/min — ABNORMAL LOW (ref >90)
GFR calc non Af Amer: 48 mL/min — ABNORMAL LOW (ref >90)
Potassium: 3.9 mEq/L (ref 3.5–5.1)

## 2013-02-12 SURGERY — PACEMAKER GENERATOR CHANGE
Anesthesia: LOCAL

## 2013-02-12 MED ORDER — ONDANSETRON HCL 4 MG/2ML IJ SOLN: 4.0000 mg | Freq: Four times a day (QID) | INTRAMUSCULAR | Status: DC | PRN

## 2013-02-12 MED ORDER — SODIUM CHLORIDE 0.9 % IV SOLN: INTRAVENOUS | Status: DC

## 2013-02-12 MED ORDER — ACETAMINOPHEN 325 MG PO TABS: 325.0000 mg | ORAL_TABLET | ORAL | Status: DC | PRN

## 2013-02-12 MED ORDER — MUPIROCIN 2 % EX OINT: TOPICAL_OINTMENT | Freq: Two times a day (BID) | CUTANEOUS | Status: DC

## 2013-02-12 MED ORDER — LIDOCAINE HCL (PF) 1 % IJ SOLN
INTRAMUSCULAR | Status: AC
  Filled 2013-02-12: qty 60

## 2013-02-12 MED ORDER — CHLORHEXIDINE GLUCONATE 4 % EX LIQD: 60.0000 mL | Freq: Once | CUTANEOUS | Status: DC

## 2013-02-12 MED ORDER — MUPIROCIN 2 % EX OINT
TOPICAL_OINTMENT | CUTANEOUS | Status: AC
  Filled 2013-02-12: qty 22

## 2013-02-12 MED ORDER — MIDAZOLAM HCL 2 MG/2ML IJ SOLN
INTRAMUSCULAR | Status: AC
  Filled 2013-02-12: qty 2

## 2013-02-12 NOTE — Interval H&P Note (Signed)
History and Physical Interval Note:  02/12/2013 10:16 AM  Melanie [name]  has presented today for surgery, with the diagnosis of End of life  The various methods of treatment have been discussed with the patient and family. After consideration of risks, benefits and other options for treatment, the patient has consented to  Procedure(s): PACEMAKER GENERATOR CHANGE (N/A) as a surgical intervention .  The patient's history has been reviewed, patient examined, no change in status, stable for surgery.  I have reviewed the patient's chart and labs.  Questions were answered to the patient's satisfaction.     [name] breathing worse, some edema

## 2013-02-12 NOTE — CV Procedure (Signed)
Preoperative diagnosis sinus node dysfunction -symptomatic permanent Postoperative diagnosis same/  Procedure: Generator replacement    Following informed consent the patient was brought to the electrophysiology laboratory in place of the fluoroscopic table in the supine position after routine prep and drape lidocaine was infiltrated in the region of the previous incision and carried down to later the device pocket using sharp dissection and electrocautery. The pocket was opened the device was freed up and was explanted.  Interrogation of the previously implanted ventricular lead Medtronic 4092  demonstrated an R wave of 10.1  millivolts., and impedance of 618 ohms, and a pacing threshold of 1.5 volts at 0.5 msec.    The previously implanted atrial lead [employer] Jude 1642 demonstrated a P-wave amplitude of 1.4 milllivolts  and impedance of  405 ohms, and a pacing threshold of 0.3 volts @ 0.5 milliseconds.  The leads were inspected. The leads were then attached to a Medtronic adapta l pulse generator, serial number [id number] .    The pocket was irrigated with antibiotic containing saline solution hemostasis was assured and the leads and the device were placed in the pocket. The wound was then closed in 3 layers in normal fashion.  The patient tolerated the procedure without apparent complication.  [name]

## 2013-02-12 NOTE — H&P (View-Only) (Signed)
PCP: Ignatius Specking., MD   Melanie [name] is a 79 y.o. female who presents today for routine electrophysiology followup.  Since last being seen in our clinic, she has done well.  She recently sent TTM to our office which revealed ERI battery status.  She is therefore added to our schedule today.  She reports increased fatigue and decreased exercise tolerance with ERI conversion (for about a month).    Today, she denies symptoms of palpitations, chest pain, shortness of breath,  lower extremity edema, dizziness, presyncope, or syncope.  The patient is otherwise without complaint today.   Past Medical History  Diagnosis Date  . Vertigo   . Paroxysmal atrial fibrillation     on coumadin and amiodarone  . Sinus node dysfunction     s/p Medtronic dual-chamber pacemaker  . Hypercalcemia   . Hypertension   . Dyslipidemia   . CAD (coronary artery disease)     Two vessel, quiescent; 70% LAD aretery disease and well collateralized, 100% RCA by cardiac cath in 2004; NL LVF; low risk Cardioloite; EF 74%, 11/10  . Hypothyroidism    Past Surgical History  Procedure Laterality Date  . Pacemaker insertion  05/24/03    MDT Kappa    Current Outpatient Prescriptions  Medication Sig Dispense Refill  . amLODipine (NORVASC) 10 MG tablet Take 5 mg by mouth daily.      . clonazePAM (KLONOPIN) 0.5 MG tablet Take 0.5 mg by mouth daily.      Marland Kitchen ezetimibe (ZETIA) 10 MG tablet Take 10 mg by mouth daily.      . isosorbide mononitrate (IMDUR) 30 MG CR tablet Take 15 mg by mouth daily.       . pantoprazole (PROTONIX) 40 MG tablet Take 40 mg by mouth 2 (two) times daily.       . raloxifene (EVISTA) 60 MG tablet Take 60 mg by mouth daily.        . rosuvastatin (CRESTOR) 10 MG tablet Take 10 mg by mouth at bedtime.      Marland Kitchen warfarin (COUMADIN) 2 MG tablet Take 2 mg by mouth daily.      Marland Kitchen amiodarone (PACERONE) 200 MG tablet Take 100 mg by mouth daily.      Marland Kitchen levothyroxine (SYNTHROID, LEVOTHROID) 88 MCG tablet Take 88 mcg  by mouth daily before breakfast.      . metoprolol (LOPRESSOR) 50 MG tablet Take 25 mg by mouth 2 (two) times daily.      Marland Kitchen OVER THE COUNTER MEDICATION Apply 1-2 drops topically 2 (two) times daily as needed (Lamisil Drops. Apply to fingers for fungal infection.).        No current facility-administered medications for this visit.   Facility-Administered Medications Ordered in Other Visits  Medication Dose Route Frequency Provider Last Rate Last Dose  . [START ON 02/12/2013] ceFAZolin (ANCEF) IVPB 2 g/50 mL premix  2 g Intravenous On Call Duke Salvia, MD      . Melene Muller ON 02/12/2013] gentamicin (GARAMYCIN) 80 mg in sodium chloride irrigation 0.9 % 500 mL irrigation  80 mg Irrigation On Call Duke Salvia, MD        Physical Exam: Filed Vitals:   02/07/13 1150  BP: 124/75  Pulse: 65  Height: 5' 3.5" (1.613 m)  Weight: 155 lb 1.3 oz (70.344 kg)    GEN- The patient is elderly appearing, alert and oriented x 3 today.   Head- normocephalic, atraumatic Eyes-  Sclera clear, conjunctiva pink Ears- hearing intact Oropharynx-  clear Lungs- Clear to ausculation bilaterally, normal work of breathing Chest- pacemaker pocket is well healed Heart- Regular rate and rhythm, no murmurs, rubs or gallops, PMI not laterally displaced GI- soft, NT, ND, + BS Extremities- no clubbing, cyanosis, or edema  Pacemaker interrogation- reviewed in detail today,  See PACEART report  Assessment and Plan:  1. Symptomatic Bradycardia/ SSS She has reached ERI battery status 01/02/13.  She requires generator change at this time. She prefers that Dr Graciela Husbands perform the procedure as he placed her PPM initially.  Risks, benefits, and alternatives to PPM generator change were discussed at length with the patient who wishes to proceed.  We will therefore schedule generator change at the next available time.  2. afib  Controlled with amiodarone She is presently on coumadin She will hold coumadin for 48 hours prior to  her generator change procedure

## 2013-02-16 ENCOUNTER — Telehealth: Payer: Self-pay | Admitting: Internal Medicine

## 2013-02-16 NOTE — Telephone Encounter (Signed)
Advised to go ED if symptoms worsen, no jaw / gum pain today.

## 2013-02-16 NOTE — Telephone Encounter (Signed)
New Prob      Pt c/o of high BP 155/73, pulse 64, little palpitations, jaw has been hurting on and off. Would like to speak to nurse.

## 2013-02-18 ENCOUNTER — Emergency Department (HOSPITAL_COMMUNITY)
Admission: EM | Admit: 2013-02-18 | Discharge: 2013-02-19 | Disposition: A | Discharge status: Home / Self Care | Payer: Medicare Other | Attending: Emergency Medicine | Admitting: Emergency Medicine

## 2013-02-18 ENCOUNTER — Encounter (HOSPITAL_COMMUNITY): Payer: Self-pay | Admitting: *Deleted

## 2013-02-18 ENCOUNTER — Emergency Department (HOSPITAL_COMMUNITY): Payer: Medicare Other

## 2013-02-18 DIAGNOSIS — Z8619 Personal history of other infectious and parasitic diseases: Secondary | ICD-10-CM | POA: Insufficient documentation

## 2013-02-18 DIAGNOSIS — Z7901 Long term (current) use of anticoagulants: Secondary | ICD-10-CM | POA: Diagnosis not present

## 2013-02-18 DIAGNOSIS — Z950 Presence of cardiac pacemaker: Secondary | ICD-10-CM | POA: Insufficient documentation

## 2013-02-18 DIAGNOSIS — Z8669 Personal history of other diseases of the nervous system and sense organs: Secondary | ICD-10-CM | POA: Diagnosis not present

## 2013-02-18 DIAGNOSIS — E039 Hypothyroidism, unspecified: Secondary | ICD-10-CM | POA: Diagnosis not present

## 2013-02-18 DIAGNOSIS — R0789 Other chest pain: Secondary | ICD-10-CM | POA: Diagnosis not present

## 2013-02-18 DIAGNOSIS — I4891 Unspecified atrial fibrillation: Secondary | ICD-10-CM | POA: Diagnosis not present

## 2013-02-18 DIAGNOSIS — E785 Hyperlipidemia, unspecified: Secondary | ICD-10-CM | POA: Insufficient documentation

## 2013-02-18 DIAGNOSIS — Z79899 Other long term (current) drug therapy: Secondary | ICD-10-CM | POA: Insufficient documentation

## 2013-02-18 DIAGNOSIS — R079 Chest pain, unspecified: Secondary | ICD-10-CM | POA: Diagnosis not present

## 2013-02-18 DIAGNOSIS — I10 Essential (primary) hypertension: Secondary | ICD-10-CM | POA: Insufficient documentation

## 2013-02-18 DIAGNOSIS — Z8639 Personal history of other endocrine, nutritional and metabolic disease: Secondary | ICD-10-CM | POA: Insufficient documentation

## 2013-02-18 DIAGNOSIS — I2510 Atherosclerotic heart disease of native coronary artery without angina pectoris: Secondary | ICD-10-CM | POA: Insufficient documentation

## 2013-02-18 DIAGNOSIS — Z862 Personal history of diseases of the blood and blood-forming organs and certain disorders involving the immune mechanism: Secondary | ICD-10-CM | POA: Diagnosis not present

## 2013-02-18 DIAGNOSIS — Z8679 Personal history of other diseases of the circulatory system: Secondary | ICD-10-CM | POA: Insufficient documentation

## 2013-02-18 LAB — CBC
HCT: 36.3 % (ref 36.0–46.0)
Hemoglobin: 12 g/dL (ref 12.0–15.0)
MCV: 92.1 fL (ref 78.0–100.0)
RBC: 3.94 MIL/uL (ref 3.87–5.11)
RDW: 13.9 % (ref 11.5–15.5)
WBC: 5 10*3/uL (ref 4.0–10.5)

## 2013-02-18 LAB — BASIC METABOLIC PANEL
BUN: 20 mg/dL (ref 6–23)
CO2: 25 mEq/L (ref 19–32)
Chloride: 105 mEq/L (ref 96–112)
Creatinine, Ser: 1.05 mg/dL (ref 0.50–1.10)
GFR calc Af Amer: 55 mL/min — ABNORMAL LOW (ref >90)
Glucose, Bld: 123 mg/dL — ABNORMAL HIGH (ref 70–99)
Potassium: 4.5 mEq/L (ref 3.5–5.1)

## 2013-02-18 LAB — POCT I-STAT TROPONIN I: Troponin i, poc: 0.01 ng/mL (ref 0.00–0.08)

## 2013-02-18 NOTE — ED Provider Notes (Signed)
History     CSN: [id number]  Arrival date & time 02/18/13  2024   First MD Initiated Contact with Patient 02/18/13 2114      Chief Complaint  Patient presents with  . Chest Pain    (Consider location/radiation/quality/duration/timing/severity/associated sxs/prior treatment) HPI Patient presents to the emergency department with left-sided chest pain, that began when she awoke this morning.  Patient, states, that she had constant chest pain throughout the day.  She states she took a nap in the afternoon and woke up with continued pain.  Patient, states she had a pacemaker placed one week ago.  Patient, states, since that, time she's had some chest discomfort. The patient denies shortness of breath, diaphoresis, nausea, vomiting, back pain, dizziness, weakness, fever, cough, blurred vision, headache or abdominal pain. patient states, that she did not take anything prior to arrival for her chest pain. she states that nothing seems to make the pain, better or worsepatient states, that, since she's been in the emergency department, but she does not have any chest pain. Past Medical History  Diagnosis Date  . Vertigo   . Paroxysmal atrial fibrillation     on coumadin and amiodarone  . Sinus node dysfunction     s/p Medtronic dual-chamber pacemaker  . Hypercalcemia   . Hypertension   . Dyslipidemia   . CAD (coronary artery disease)     Two vessel, quiescent; 70% LAD aretery disease and well collateralized, 100% RCA by cardiac cath in 2004; NL LVF; low risk Cardioloite; EF 74%, 11/10  . Hypothyroidism     Past Surgical History  Procedure Laterality Date  . Pacemaker insertion  05/24/03    MDT Kappa    Family History  Problem Relation Age of Onset  . Hypertension Neg Hx   . Diabetes Neg Hx   . Coronary artery disease Neg Hx     History  Substance Use Topics  . Smoking status: Never Smoker   . Smokeless tobacco: Never Used  . Alcohol Use: No    OB History   Grav Para Term  Preterm Abortions TAB SAB Ect Mult Living                  Review of Systems All other systems negative except as documented in the HPI. All pertinent positives and negatives as reviewed in the HPI. Allergies  Ciprofloxacin; Hctz; Lopid; and Zestril  Home Medications   Current Outpatient Rx  Name  Route  Sig  Dispense  Refill  . amiodarone (PACERONE) 200 MG tablet   Oral   Take 100 mg by mouth daily.         Marland Kitchen amLODipine (NORVASC) 10 MG tablet   Oral   Take 10 mg by mouth daily.          . clonazePAM (KLONOPIN) 0.5 MG tablet   Oral   Take 0.5 mg by mouth daily.         Marland Kitchen ezetimibe (ZETIA) 10 MG tablet   Oral   Take 10 mg by mouth daily.         . isosorbide mononitrate (IMDUR) 30 MG CR tablet   Oral   Take 15 mg by mouth daily.          Marland Kitchen levothyroxine (SYNTHROID, LEVOTHROID) 88 MCG tablet   Oral   Take 88 mcg by mouth daily before breakfast.         . metoprolol (LOPRESSOR) 50 MG tablet   Oral   Take 25 mg  by mouth 2 (two) times daily.         Marland Kitchen OVER THE COUNTER MEDICATION   Topical   Apply 1-2 drops topically 2 (two) times daily as needed (Lamisil Drops. Apply to fingers for fungal infection.).          Marland Kitchen pantoprazole (PROTONIX) 40 MG tablet   Oral   Take 40 mg by mouth 2 (two) times daily.          . raloxifene (EVISTA) 60 MG tablet   Oral   Take 60 mg by mouth daily.           . rosuvastatin (CRESTOR) 10 MG tablet   Oral   Take 10 mg by mouth at bedtime.         Marland Kitchen warfarin (COUMADIN) 2 MG tablet   Oral   Take 2 mg by mouth daily.           BP 121/64  Pulse 50  Temp(Src) 98 F (36.7 C) (Oral)  Resp 18  SpO2 94%  Physical Exam  Nursing note and vitals reviewed. Constitutional: She is oriented to person, place, and time. She appears well-developed and well-nourished. No distress.  HENT:  Head: Normocephalic and atraumatic.  Mouth/Throat: Oropharynx is clear and moist.  Eyes: Pupils are equal, round, and reactive to  light.  Neck: Normal range of motion. Neck supple.  Cardiovascular: Normal rate, regular rhythm and normal heart sounds.  Exam reveals no gallop and no friction rub.   No murmur heard. Pulmonary/Chest: Effort normal and breath sounds normal. No respiratory distress. She has no wheezes. She has no rales. She exhibits no tenderness.  Abdominal: Soft. Bowel sounds are normal. She exhibits no distension. There is no tenderness. There is no guarding.  Neurological: She is alert and oriented to person, place, and time. She exhibits normal muscle tone. Coordination normal.  Skin: Skin is warm and dry. No rash noted.    ED Course  Procedures (including critical care time)  Labs Reviewed  BASIC METABOLIC PANEL - Abnormal; Notable for the following:    Glucose, Bld 123 (*)    GFR calc non Af Amer 47 (*)    GFR calc Af Amer 55 (*)    All other components within normal limits  CBC  POCT I-STAT TROPONIN I  POCT I-STAT TROPONIN I   Dg Chest 2 View  02/18/2013   *RADIOLOGY REPORT*  Clinical Data: Left-sided chest pain; recent pacemaker change.  CHEST - 2 VIEW  Comparison: Chest radiograph performed 07/18/2012  Findings: The lungs are well-aerated.  Mild left basilar opacity likely reflects atelectasis.  There is no evidence of pleural effusion or pneumothorax.  The heart is mildly enlarged.  A pacemaker is noted at the left chest wall, with leads ending at the right atrium and right ventricle.  Prominence of the right paratracheal soft tissues is stable from multiple prior studies and likely reflects normal vasculature.  No acute osseous abnormalities are seen.  Diffuse calcification is noted along the proximal abdominal aorta.  IMPRESSION: Mild left basilar opacity likely reflects atelectasis; lungs otherwise clear.  Mild cardiomegaly noted.   Original Report Authenticated By: Tonia Ghent, M.D.    Despite the patient's cardiac history feel that the chest pain is not associated with true cardiac  etiology.  This may be associated with her pacemaker placement.  Patient has 2 sets of negative enzymes and she does have atypical chest pain that was constant all day.  The pain has resolved since  been the emergency department.  Patient's EKG does not show any acute changes  MDM   Date: 02/18/2013  Rate: 70  Rhythm: Paced rhythm  QRS Axis: normal  Intervals: normal  ST/T Wave abnormalities: nonspecific T wave changes  Conduction Disutrbances:none  Narrative Interpretation:   Old EKG Reviewed: changes notedpatient has changes in her T waves in II, III aVF in her anterolateral leads V3 through 6.          Carlyle Dolly, PA-C 02/18/13 [phone]

## 2013-02-18 NOTE — ED Notes (Signed)
Pt c/o left sided and central non radiating CP that started this morning. Pt had Pacemaker placed on Monday. Pain relived by burping. Pain exacerbated by nothing.

## 2013-02-19 NOTE — ED Provider Notes (Signed)
Medical screening examination/treatment/procedure(s) were conducted as a shared visit with non-physician practitioner(s) and myself.  I personally evaluated the patient during the encounter.  Normal physical exam. EKG negative acute.  Troponin negative.  Patient has primary care followup  Donnetta Hutching, MD 02/19/13 [phone]

## 2013-02-19 NOTE — ED Notes (Signed)
No changes from triage assessment 

## 2013-02-21 ENCOUNTER — Telehealth: Payer: Self-pay | Admitting: Internal Medicine

## 2013-02-21 NOTE — Telephone Encounter (Signed)
Wound check 6/5 - [facility].

## 2013-02-21 NOTE — Telephone Encounter (Signed)
Spoke with pt, she reports having problems since her device was implanted. She reports palpitations, pain in her chest at the site. She has an appt next week for a wound check but ask for it to be moved to tomorrow. appt rescheduled.

## 2013-02-21 NOTE — Telephone Encounter (Signed)
New problem    Per pt she's been having problems w/palpatations since she got the pacemaker on the 19th

## 2013-02-22 ENCOUNTER — Ambulatory Visit (INDEPENDENT_AMBULATORY_CARE_PROVIDER_SITE_OTHER): Payer: Medicaid Other | Admitting: *Deleted

## 2013-02-22 ENCOUNTER — Encounter: Payer: Self-pay | Admitting: Internal Medicine

## 2013-02-22 DIAGNOSIS — I495 Sick sinus syndrome: Secondary | ICD-10-CM | POA: Diagnosis not present

## 2013-02-22 DIAGNOSIS — I4891 Unspecified atrial fibrillation: Secondary | ICD-10-CM

## 2013-02-22 LAB — PACEMAKER DEVICE OBSERVATION
ATRIAL PACING PM: 88
BAMS-0001: 150 {beats}/min
BATTERY VOLTAGE: 2.8 V
RV LEAD AMPLITUDE: 15.68 mv
VENTRICULAR PACING PM: 2

## 2013-02-22 NOTE — Progress Notes (Signed)
Wound check ppm in clinic.  

## 2013-02-23 DIAGNOSIS — E7800 Pure hypercholesterolemia, unspecified: Secondary | ICD-10-CM | POA: Diagnosis not present

## 2013-02-23 DIAGNOSIS — E039 Hypothyroidism, unspecified: Secondary | ICD-10-CM | POA: Diagnosis not present

## 2013-02-23 DIAGNOSIS — I4891 Unspecified atrial fibrillation: Secondary | ICD-10-CM | POA: Diagnosis not present

## 2013-02-23 DIAGNOSIS — Z0000 Encounter for general adult medical examination without abnormal findings: Secondary | ICD-10-CM | POA: Diagnosis not present

## 2013-02-23 DIAGNOSIS — Z79899 Other long term (current) drug therapy: Secondary | ICD-10-CM | POA: Diagnosis not present

## 2013-02-23 DIAGNOSIS — Z1212 Encounter for screening for malignant neoplasm of rectum: Secondary | ICD-10-CM | POA: Diagnosis not present

## 2013-02-26 ENCOUNTER — Encounter: Payer: Self-pay | Admitting: Internal Medicine

## 2013-02-26 DIAGNOSIS — Z950 Presence of cardiac pacemaker: Secondary | ICD-10-CM | POA: Diagnosis not present

## 2013-02-26 DIAGNOSIS — I4891 Unspecified atrial fibrillation: Secondary | ICD-10-CM | POA: Diagnosis not present

## 2013-02-26 DIAGNOSIS — I495 Sick sinus syndrome: Secondary | ICD-10-CM

## 2013-02-26 DIAGNOSIS — G2581 Restless legs syndrome: Secondary | ICD-10-CM | POA: Diagnosis not present

## 2013-03-01 ENCOUNTER — Encounter: Payer: Self-pay | Admitting: Internal Medicine

## 2013-03-01 ENCOUNTER — Ambulatory Visit: Payer: Medicare Other

## 2013-03-23 DIAGNOSIS — I4891 Unspecified atrial fibrillation: Secondary | ICD-10-CM | POA: Diagnosis not present

## 2013-04-16 DIAGNOSIS — I4891 Unspecified atrial fibrillation: Secondary | ICD-10-CM | POA: Diagnosis not present

## 2013-04-16 DIAGNOSIS — I10 Essential (primary) hypertension: Secondary | ICD-10-CM | POA: Diagnosis not present

## 2013-04-20 DIAGNOSIS — I10 Essential (primary) hypertension: Secondary | ICD-10-CM | POA: Diagnosis not present

## 2013-04-27 ENCOUNTER — Emergency Department (HOSPITAL_COMMUNITY)
Admission: EM | Admit: 2013-04-27 | Discharge: 2013-04-27 | Disposition: A | Discharge status: Home / Self Care | Payer: Medicare Other | Attending: Emergency Medicine | Admitting: Emergency Medicine

## 2013-04-27 ENCOUNTER — Encounter (HOSPITAL_COMMUNITY): Payer: Self-pay

## 2013-04-27 DIAGNOSIS — Z8669 Personal history of other diseases of the nervous system and sense organs: Secondary | ICD-10-CM | POA: Diagnosis not present

## 2013-04-27 DIAGNOSIS — I2510 Atherosclerotic heart disease of native coronary artery without angina pectoris: Secondary | ICD-10-CM | POA: Diagnosis not present

## 2013-04-27 DIAGNOSIS — Z7901 Long term (current) use of anticoagulants: Secondary | ICD-10-CM | POA: Insufficient documentation

## 2013-04-27 DIAGNOSIS — I4891 Unspecified atrial fibrillation: Secondary | ICD-10-CM | POA: Diagnosis not present

## 2013-04-27 DIAGNOSIS — Z8679 Personal history of other diseases of the circulatory system: Secondary | ICD-10-CM | POA: Insufficient documentation

## 2013-04-27 DIAGNOSIS — Z87828 Personal history of other (healed) physical injury and trauma: Secondary | ICD-10-CM | POA: Insufficient documentation

## 2013-04-27 DIAGNOSIS — Z950 Presence of cardiac pacemaker: Secondary | ICD-10-CM | POA: Diagnosis not present

## 2013-04-27 DIAGNOSIS — E785 Hyperlipidemia, unspecified: Secondary | ICD-10-CM | POA: Diagnosis not present

## 2013-04-27 DIAGNOSIS — R42 Dizziness and giddiness: Secondary | ICD-10-CM | POA: Insufficient documentation

## 2013-04-27 DIAGNOSIS — R51 Headache: Secondary | ICD-10-CM | POA: Insufficient documentation

## 2013-04-27 DIAGNOSIS — Z862 Personal history of diseases of the blood and blood-forming organs and certain disorders involving the immune mechanism: Secondary | ICD-10-CM | POA: Insufficient documentation

## 2013-04-27 DIAGNOSIS — Z79899 Other long term (current) drug therapy: Secondary | ICD-10-CM | POA: Diagnosis not present

## 2013-04-27 DIAGNOSIS — H539 Unspecified visual disturbance: Secondary | ICD-10-CM | POA: Insufficient documentation

## 2013-04-27 DIAGNOSIS — E039 Hypothyroidism, unspecified: Secondary | ICD-10-CM | POA: Diagnosis not present

## 2013-04-27 DIAGNOSIS — I10 Essential (primary) hypertension: Secondary | ICD-10-CM

## 2013-04-27 DIAGNOSIS — Z8639 Personal history of other endocrine, nutritional and metabolic disease: Secondary | ICD-10-CM | POA: Insufficient documentation

## 2013-04-27 MED ORDER — LISINOPRIL 10 MG PO TABS: 10.0000 mg | ORAL_TABLET | Freq: Every day | ORAL | Status: DC

## 2013-04-27 NOTE — ED Provider Notes (Signed)
CSN: [id number]     Arrival date & time 04/27/13  1858 History  This chart was scribed for Raeford Razor, MD by Bennett Scrape, ED Scribe. This patient was seen in room APA12/[facility]12 and the patient's care was started at 7:39 PM   First MD Initiated Contact with Patient 04/27/13 1930     Chief Complaint  Patient presents with  . Hypertension  . Headache    The history is provided by the patient. No language interpreter was used.   HPI Comments: Melanie [name] is a 79 y.o. female who presents to the Emergency Department complaining of persistent HTN for "several weeks". BPs at home have been bwtween 190/98 and 181/90 since the onset. BP in the ED is 182/65. She states that she is currently on lisinopril and metoprolol for HTN. She reports that had her lisinopril increased from 10 mg to 20 mg (one pill twice daily) 1.5 weeks ago by her PCP with no improvement. Occasional mild HA. No confusion. No acute numbness, tingling or loss of strength. She reports on-going visual problems that she is following up with her opthalmologist for but denies urinary symptoms, fevers, chills, left swelling, CP, SOB and confusion. She states that she fell and broke her leg in September 2013. Since then she has had decreased ambulation. She is currently on Warfarin for A. Fib and pacemaker.  PCP is Dr.  Sherril Croon  Past Medical History  Diagnosis Date  . Vertigo   . Paroxysmal atrial fibrillation     on coumadin and amiodarone  . Sinus node dysfunction     s/p Medtronic dual-chamber pacemaker  . Hypercalcemia   . Hypertension   . Dyslipidemia   . CAD (coronary artery disease)     Two vessel, quiescent; 70% LAD aretery disease and well collateralized, 100% RCA by cardiac cath in 2004; NL LVF; low risk Cardioloite; EF 74%, 11/10  . Hypothyroidism    Past Surgical History  Procedure Laterality Date  . Pacemaker insertion  05/24/03    MDT Kappa   Family History  Problem Relation Age of Onset  . Hypertension  Neg Hx   . Diabetes Neg Hx   . Coronary artery disease Neg Hx    History  Substance Use Topics  . Smoking status: Never Smoker   . Smokeless tobacco: Never Used  . Alcohol Use: No   No OB history provided.  Review of Systems  Constitutional: Negative for fever and chills.  Eyes: Positive for visual disturbance (on-going).  Cardiovascular: Negative for leg swelling.  Genitourinary: Negative for dysuria, frequency and hematuria.  Neurological: Positive for dizziness and headaches.  All other systems reviewed and are negative.    Allergies  Ciprofloxacin; Hctz; Lopid; and Zestril  Home Medications   Current Outpatient Rx  Name  Route  Sig  Dispense  Refill  . amiodarone (PACERONE) 200 MG tablet   Oral   Take 100 mg by mouth daily.         Marland Kitchen amLODipine (NORVASC) 10 MG tablet   Oral   Take 5 mg by mouth daily.          . clonazePAM (KLONOPIN) 0.5 MG tablet   Oral   Take 0.5 mg by mouth daily.         Marland Kitchen ezetimibe (ZETIA) 10 MG tablet   Oral   Take 10 mg by mouth daily.         . isosorbide mononitrate (IMDUR) 30 MG CR tablet   Oral  Take 15 mg by mouth daily.          Marland Kitchen levothyroxine (SYNTHROID, LEVOTHROID) 88 MCG tablet   Oral   Take 88 mcg by mouth daily before breakfast.         . metoprolol (LOPRESSOR) 50 MG tablet   Oral   Take 25 mg by mouth 2 (two) times daily.         Marland Kitchen OVER THE COUNTER MEDICATION   Topical   Apply 1-2 drops topically 2 (two) times daily as needed (Lamisil Drops. Apply to fingers for fungal infection.).          Marland Kitchen pantoprazole (PROTONIX) 40 MG tablet   Oral   Take 40 mg by mouth 2 (two) times daily.          . raloxifene (EVISTA) 60 MG tablet   Oral   Take 60 mg by mouth daily.           . rosuvastatin (CRESTOR) 10 MG tablet   Oral   Take 10 mg by mouth at bedtime.         Marland Kitchen warfarin (COUMADIN) 2 MG tablet   Oral   Take 2 mg by mouth daily.          Triage Vitals: BP 182/65  Pulse 92  Temp(Src)  98.5 F (36.9 C) (Oral)  Resp 18  Ht 5\' 3"  (1.6 m)  Wt 154 lb (69.854 kg)  BMI 27.29 kg/m2  SpO2 95%  Physical Exam  Nursing note and vitals reviewed. Constitutional: She is oriented to person, place, and time. She appears well-developed and well-nourished. No distress.  HENT:  Head: Normocephalic and atraumatic.  Eyes: Conjunctivae and EOM are normal.  Neck: Normal range of motion. Neck supple. No tracheal deviation present.  Cardiovascular: Normal rate, regular rhythm and normal heart sounds.   No murmur heard. Pulmonary/Chest: Effort normal and breath sounds normal. No respiratory distress. She has no wheezes. She has no rales.  Abdominal: Soft. Bowel sounds are normal. There is no tenderness.  Musculoskeletal: Normal range of motion. She exhibits no edema.  Neurological: She is alert and oriented to person, place, and time. No cranial nerve deficit.  Normal finger to nose bilaterally, strength is intact throughout   Skin: Skin is warm and dry.  Psychiatric: She has a normal mood and affect. Her behavior is normal.    ED Course   Procedures (including critical care time)  DIAGNOSTIC STUDIES: Oxygen Saturation is 95% on room air, adequate by my interpretation.    COORDINATION OF CARE: 7:51 PM-I had lengthy discussion over BP dynamics and possible HTN complications with pt. Discussed discharge plan which includes increasing lisinopril to 30 mg (one 10 mg dose and one 20 mg dose) and keeping a log of BP times noted. I am generally hesitant to adjust BP meds as I cannot realistically follow up. Pt with some anxiety over her elevated BPs though. Showed log over past several days which consistently in same range as here in ED. She reports medication compliance.  I do not feel that it is unreasonable to make small increase in lisinopril.  Advised pt that she will have to follow up with PCP, limit the amount of salt in her diet and exercise within her limitations.  1. Hypertension      MDM  87yf with HTN. Asymptomatic. No indication for emergent reduction. No history to suggest acute end organ damage and thus further w/u at this time. PCP FU.   I personally  preformed the services scribed in my presence. The recorded information has been reviewed is accurate. Raeford Razor, MD.   Raeford Razor, MD 04/27/13 [phone]

## 2013-04-27 NOTE — ED Notes (Signed)
Pt alert & oriented x4, stable gait. Patient  given discharge instructions, paperwork & prescription(s). Patient verbalized understanding. Pt left department w/ no further questions. 

## 2013-04-27 NOTE — ED Notes (Signed)
Pt reports her PCP has changed her medications, & her pressure has been staying high. Pt checking pressure several times a day. Pt says now feels like a pressure in the top of her head.

## 2013-04-27 NOTE — ED Notes (Signed)
High blood pressure for several weeks, my MD has increased my blood pressure medications and it is not helping per pt. It was 198/90 this morning per pt. I have been to the MD twice about it per pt. Have not spoken to him today.

## 2013-04-30 DIAGNOSIS — I10 Essential (primary) hypertension: Secondary | ICD-10-CM | POA: Diagnosis not present

## 2013-05-07 DIAGNOSIS — I10 Essential (primary) hypertension: Secondary | ICD-10-CM | POA: Diagnosis not present

## 2013-05-13 ENCOUNTER — Encounter (HOSPITAL_COMMUNITY): Payer: Self-pay | Admitting: Nurse Practitioner

## 2013-05-13 ENCOUNTER — Emergency Department (HOSPITAL_COMMUNITY)
Admission: EM | Admit: 2013-05-13 | Discharge: 2013-05-13 | Disposition: A | Discharge status: Home / Self Care | Payer: Medicare Other | Attending: Emergency Medicine | Admitting: Emergency Medicine

## 2013-05-13 DIAGNOSIS — E785 Hyperlipidemia, unspecified: Secondary | ICD-10-CM | POA: Insufficient documentation

## 2013-05-13 DIAGNOSIS — I2510 Atherosclerotic heart disease of native coronary artery without angina pectoris: Secondary | ICD-10-CM | POA: Diagnosis not present

## 2013-05-13 DIAGNOSIS — R42 Dizziness and giddiness: Secondary | ICD-10-CM | POA: Diagnosis not present

## 2013-05-13 DIAGNOSIS — Z862 Personal history of diseases of the blood and blood-forming organs and certain disorders involving the immune mechanism: Secondary | ICD-10-CM | POA: Insufficient documentation

## 2013-05-13 DIAGNOSIS — I4891 Unspecified atrial fibrillation: Secondary | ICD-10-CM | POA: Insufficient documentation

## 2013-05-13 DIAGNOSIS — E039 Hypothyroidism, unspecified: Secondary | ICD-10-CM | POA: Diagnosis not present

## 2013-05-13 DIAGNOSIS — Z7901 Long term (current) use of anticoagulants: Secondary | ICD-10-CM | POA: Diagnosis not present

## 2013-05-13 DIAGNOSIS — Z8709 Personal history of other diseases of the respiratory system: Secondary | ICD-10-CM | POA: Diagnosis not present

## 2013-05-13 DIAGNOSIS — Z8639 Personal history of other endocrine, nutritional and metabolic disease: Secondary | ICD-10-CM | POA: Insufficient documentation

## 2013-05-13 DIAGNOSIS — Z79899 Other long term (current) drug therapy: Secondary | ICD-10-CM | POA: Diagnosis not present

## 2013-05-13 DIAGNOSIS — I10 Essential (primary) hypertension: Secondary | ICD-10-CM | POA: Diagnosis not present

## 2013-05-13 LAB — CBC WITH DIFFERENTIAL/PLATELET
Basophils Relative: 0 % (ref 0–1)
Hemoglobin: 12.5 g/dL (ref 12.0–15.0)
Lymphocytes Relative: 43 % (ref 12–46)
MCHC: 34.2 g/dL (ref 30.0–36.0)
Monocytes Relative: 13 % — ABNORMAL HIGH (ref 3–12)
Neutro Abs: 2.1 10*3/uL (ref 1.7–7.7)
Neutrophils Relative %: 42 % — ABNORMAL LOW (ref 43–77)
RBC: 4.13 MIL/uL (ref 3.87–5.11)
WBC: 5 10*3/uL (ref 4.0–10.5)

## 2013-05-13 LAB — BASIC METABOLIC PANEL
BUN: 13 mg/dL (ref 6–23)
Chloride: 104 mEq/L (ref 96–112)
GFR calc Af Amer: 57 mL/min — ABNORMAL LOW (ref >90)
Potassium: 4.7 mEq/L (ref 3.5–5.1)

## 2013-05-13 LAB — PROTIME-INR: Prothrombin Time: 23.6 seconds — ABNORMAL HIGH (ref 11.6–15.2)

## 2013-05-13 NOTE — ED Notes (Signed)
Pt reports she checked her BP and it was high today, denies any physical complaints. States she has been working with PCP over past month for management of BP at home, states it has been high often she has been having headaches and he has been changing the dose of her medications. This am it was 190/118 on her home monitor.

## 2013-05-13 NOTE — ED Provider Notes (Signed)
CSN: [id number]     Arrival date & time 05/13/13  1344 History     First MD Initiated Contact with Patient 05/13/13 1610     Chief Complaint  Patient presents with  . Hypertension   (Consider location/radiation/quality/duration/timing/severity/associated sxs/prior Treatment) HPI Comments: Patient is an 79 year old female with a past medical history of atrial fibrillation, hypertension and CAD who presents with high blood pressure since this morning. Patient reports she woke up and checked her BP and it was 190/118. Patient went to her neighbor's house who also checked her BP and it was the same. Patient took a clonidine and her neighbor called 911. Patient currently denies any symptoms. She reports seeing her PCP about frequent BP fluctuations who has been changing her medications. Patient states she will occasionally feel head pressure but denies any symptoms today. No aggravating/alleviating factors. No associated symptoms.   Patient is a 79 y.o. female presenting with hypertension.  Hypertension    Past Medical History  Diagnosis Date  . Vertigo   . Paroxysmal atrial fibrillation     on coumadin and amiodarone  . Sinus node dysfunction     s/p Medtronic dual-chamber pacemaker  . Hypercalcemia   . Hypertension   . Dyslipidemia   . CAD (coronary artery disease)     Two vessel, quiescent; 70% LAD aretery disease and well collateralized, 100% RCA by cardiac cath in 2004; NL LVF; low risk Cardioloite; EF 74%, 11/10  . Hypothyroidism    Past Surgical History  Procedure Laterality Date  . Pacemaker insertion  05/24/03    MDT Kappa   Family History  Problem Relation Age of Onset  . Hypertension Neg Hx   . Diabetes Neg Hx   . Coronary artery disease Neg Hx    History  Substance Use Topics  . Smoking status: Never Smoker   . Smokeless tobacco: Never Used  . Alcohol Use: No   OB History   Grav Para Term Preterm Abortions TAB SAB Ect Mult Living                 Review of  Systems  Constitutional:       High blood pressure  All other systems reviewed and are negative.    Allergies  Amlodipine; Ciprofloxacin; Hctz; and Lopid  Home Medications   Current Outpatient Rx  Name  Route  Sig  Dispense  Refill  . amiodarone (PACERONE) 200 MG tablet   Oral   Take 100 mg by mouth daily.         . clonazePAM (KLONOPIN) 0.5 MG tablet   Oral   Take 0.5 mg by mouth at bedtime.          . cloNIDine (CATAPRES) 0.1 MG tablet   Oral   Take 0.1 mg by mouth daily as needed (for blood pressure > 130).         . ezetimibe (ZETIA) 10 MG tablet   Oral   Take 10 mg by mouth daily.         . isosorbide mononitrate (IMDUR) 30 MG 24 hr tablet   Oral   Take 15 mg by mouth daily.         Marland Kitchen levothyroxine (SYNTHROID, LEVOTHROID) 88 MCG tablet   Oral   Take 88 mcg by mouth daily before breakfast.         . lisinopril (PRINIVIL,ZESTRIL) 20 MG tablet   Oral   Take 20 mg by mouth 2 (two) times daily.         Marland Kitchen  metoprolol (LOPRESSOR) 50 MG tablet   Oral   Take 25 mg by mouth 2 (two) times daily.         . pantoprazole (PROTONIX) 40 MG tablet   Oral   Take 40 mg by mouth daily.          . raloxifene (EVISTA) 60 MG tablet   Oral   Take 60 mg by mouth daily.           . rosuvastatin (CRESTOR) 10 MG tablet   Oral   Take 10 mg by mouth at bedtime.         Marland Kitchen warfarin (COUMADIN) 2 MG tablet   Oral   Take 2 mg by mouth See admin instructions. Take 1 tablet by mouth every day except Friday. Do not take any Warfarin on Friday          BP 144/75  Pulse 63  Temp(Src) 98.9 F (37.2 C) (Oral)  Resp 16  Ht 5\' 4"  (1.626 m)  Wt 154 lb (69.854 kg)  BMI 26.42 kg/m2  SpO2 95% Physical Exam  Nursing note and vitals reviewed. Constitutional: She is oriented to person, place, and time. She appears well-developed and well-nourished. No distress.  HENT:  Head: Normocephalic and atraumatic.  Eyes: Conjunctivae and EOM are normal. Pupils are equal,  round, and reactive to light.  Neck: Normal range of motion.  Cardiovascular: Normal rate and regular rhythm.  Exam reveals no gallop and no friction rub.   No murmur heard. Pulmonary/Chest: Effort normal and breath sounds normal. She has no wheezes. She has no rales. She exhibits no tenderness.  Abdominal: Soft. She exhibits no distension. There is no tenderness. There is no rebound.  Musculoskeletal: Normal range of motion.  Neurological: She is alert and oriented to person, place, and time. Coordination normal.  Speech is goal-oriented. Moves limbs without ataxia.   Skin: Skin is warm and dry.  Psychiatric: She has a normal mood and affect. Her behavior is normal.    ED Course   Procedures (including critical care time)  Labs Reviewed  CBC WITH DIFFERENTIAL - Abnormal; Notable for the following:    Platelets 122 (*)    Neutrophils Relative % 42 (*)    Monocytes Relative 13 (*)    All other components within normal limits  BASIC METABOLIC PANEL - Abnormal; Notable for the following:    Glucose, Bld 112 (*)    GFR calc non Af Amer 49 (*)    GFR calc Af Amer 57 (*)    All other components within normal limits  PROTIME-INR - Abnormal; Notable for the following:    Prothrombin Time 23.6 (*)    INR 2.18 (*)    All other components within normal limits   No results found.  1. Hypertension     MDM  5:19 PM Labs pending. Patient's BP here is 144/75. Patient denies any complaints at this time. Other vitals stable and patient is afebrile.   6:04 PM Labs unremarkable for acute changes. Patient will be discharged with instructions to follow up with her PCP. Patient instructed to return with worsening or concerning symptoms.   Emilia Beck, [place] 05/13/13 1808

## 2013-05-13 NOTE — ED Notes (Signed)
Patient discharged to home with family. NAD.  

## 2013-05-15 DIAGNOSIS — I10 Essential (primary) hypertension: Secondary | ICD-10-CM | POA: Diagnosis not present

## 2013-05-17 NOTE — ED Provider Notes (Signed)
Medical screening examination/treatment/procedure(s) were conducted as a shared visit with non-physician practitioner(s) and myself.  I personally evaluated the patient during the encounter Pt with hx htn, c/o bp high earlier today, otherwise feels at baseline, no new symptoms. bp elevated however, not crtically so, and pt otherwise asymptomatic. rec close pcp f/u.   Suzi Roots, MD 05/17/13 [phone]

## 2013-05-21 ENCOUNTER — Ambulatory Visit (INDEPENDENT_AMBULATORY_CARE_PROVIDER_SITE_OTHER): Payer: Medicare Other | Admitting: Internal Medicine

## 2013-05-21 ENCOUNTER — Encounter: Payer: Self-pay | Admitting: Internal Medicine

## 2013-05-21 VITALS — BP 127/68 | HR 79 | Ht 64.0 in | Wt 156.8 lb

## 2013-05-21 DIAGNOSIS — I495 Sick sinus syndrome: Secondary | ICD-10-CM

## 2013-05-21 DIAGNOSIS — I4891 Unspecified atrial fibrillation: Secondary | ICD-10-CM

## 2013-05-21 DIAGNOSIS — Z950 Presence of cardiac pacemaker: Secondary | ICD-10-CM

## 2013-05-21 DIAGNOSIS — I10 Essential (primary) hypertension: Secondary | ICD-10-CM

## 2013-05-21 LAB — PACEMAKER DEVICE OBSERVATION
AL AMPLITUDE: 1.4 mv
BAMS-0001: 150 {beats}/min
RV LEAD IMPEDENCE PM: 756 Ohm

## 2013-05-21 NOTE — Patient Instructions (Signed)
Continue all current medications. [facility] remote checks  Your physician wants you to follow up in:  1 year.  You will receive a reminder letter in the mail one-two months in advance.  If you don't receive a letter, please call our office to schedule the follow up appointment

## 2013-05-21 NOTE — Progress Notes (Signed)
PCP: Ignatius Specking., MD   Melanie [name] is a 79 y.o. female who presents today for routine electrophysiology followup.  Since her recent PPM generator change, she has done well.  She has stopped norvasc recently due to edema.  Her Bp has subsequently increased.  Today, she denies symptoms of palpitations, chest pain, shortness of breath,  lower extremity edema, dizziness, presyncope, or syncope.  The patient is otherwise without complaint today.   Past Medical History  Diagnosis Date  . Vertigo   . Paroxysmal atrial fibrillation     on coumadin and amiodarone  . Sinus node dysfunction     s/p Medtronic dual-chamber pacemaker  . Hypercalcemia   . Hypertension   . Dyslipidemia   . CAD (coronary artery disease)     Two vessel, quiescent; 70% LAD aretery disease and well collateralized, 100% RCA by cardiac cath in 2004; NL LVF; low risk Cardioloite; EF 74%, 11/10  . Hypothyroidism    Past Surgical History  Procedure Laterality Date  . Pacemaker insertion  05/24/03    MDT Kappa    Current Outpatient Prescriptions  Medication Sig Dispense Refill  . ALPRAZolam (XANAX) 0.25 MG tablet Take 0.25 mg by mouth at bedtime as needed for sleep.      Marland Kitchen amiodarone (PACERONE) 200 MG tablet Take 100 mg by mouth daily.      . carvedilol (COREG) 12.5 MG tablet Take 12.5 mg by mouth 2 (two) times daily with a meal.      . clonazePAM (KLONOPIN) 0.5 MG tablet Take 0.5 mg by mouth at bedtime.       . cloNIDine (CATAPRES) 0.1 MG tablet Take 0.1 mg by mouth daily as needed (for blood pressure > 130).      . ezetimibe (ZETIA) 10 MG tablet Take 10 mg by mouth daily.      . isosorbide mononitrate (IMDUR) 30 MG 24 hr tablet Take 15 mg by mouth daily.      Marland Kitchen levothyroxine (SYNTHROID, LEVOTHROID) 88 MCG tablet Take 88 mcg by mouth daily before breakfast.      . lisinopril (PRINIVIL,ZESTRIL) 20 MG tablet Take 20 mg by mouth 2 (two) times daily.      . pantoprazole (PROTONIX) 40 MG tablet Take 40 mg by mouth daily.        . raloxifene (EVISTA) 60 MG tablet Take 60 mg by mouth daily.        . rosuvastatin (CRESTOR) 10 MG tablet Take 10 mg by mouth at bedtime.      Marland Kitchen warfarin (COUMADIN) 2 MG tablet Take 2 mg by mouth See admin instructions. Take 1 tablet by mouth every day except Friday. Do not take any Warfarin on Friday       No current facility-administered medications for this visit.    Physical Exam: Filed Vitals:   05/21/13 1344  BP: 127/68  Pulse: 79  Height: 5\' 4"  (1.626 m)  Weight: 156 lb 12.8 oz (71.124 kg)    GEN- The patient is elderly appearing, alert and oriented x 3 today.   Head- normocephalic, atraumatic Eyes-  Sclera clear, conjunctiva pink Ears- hearing intact Oropharynx- clear Lungs- Clear to ausculation bilaterally, normal work of breathing Chest- pacemaker pocket is well healed Heart- Regular rate and rhythm, no murmurs, rubs or gallops, PMI not laterally displaced GI- soft, NT, ND, + BS Extremities- no clubbing, cyanosis, or edema  Pacemaker interrogation- reviewed in detail today,  See PACEART report  Assessment and Plan:  1. Symptomatic Bradycardia/ SSS  Normal pacemaker function See Pace Art report No changes today  2. afib  Controlled with amiodarone She is presently on coumadin She will hold coum  3. HTN Improved with recently added coreg.  If her Bp remains elevated then increasing coreg further would be reasonable.    carelink every 3 months Return in 1 year

## 2013-05-22 DIAGNOSIS — I10 Essential (primary) hypertension: Secondary | ICD-10-CM | POA: Diagnosis not present

## 2013-05-22 DIAGNOSIS — I4891 Unspecified atrial fibrillation: Secondary | ICD-10-CM | POA: Diagnosis not present

## 2013-06-01 DIAGNOSIS — I10 Essential (primary) hypertension: Secondary | ICD-10-CM | POA: Diagnosis not present

## 2013-06-17 ENCOUNTER — Other Ambulatory Visit: Payer: Self-pay | Admitting: Physician Assistant

## 2013-06-19 DIAGNOSIS — I4891 Unspecified atrial fibrillation: Secondary | ICD-10-CM | POA: Diagnosis not present

## 2013-06-27 ENCOUNTER — Ambulatory Visit: Payer: Medicare Other | Admitting: Cardiovascular Disease

## 2013-06-28 ENCOUNTER — Encounter: Payer: Self-pay | Admitting: Cardiovascular Disease

## 2013-06-28 ENCOUNTER — Ambulatory Visit (INDEPENDENT_AMBULATORY_CARE_PROVIDER_SITE_OTHER): Payer: Medicare Other | Admitting: Cardiovascular Disease

## 2013-06-28 VITALS — BP 122/82 | HR 84 | Ht 63.5 in | Wt 153.8 lb

## 2013-06-28 DIAGNOSIS — I4891 Unspecified atrial fibrillation: Secondary | ICD-10-CM

## 2013-06-28 DIAGNOSIS — Z950 Presence of cardiac pacemaker: Secondary | ICD-10-CM

## 2013-06-28 DIAGNOSIS — I2510 Atherosclerotic heart disease of native coronary artery without angina pectoris: Secondary | ICD-10-CM | POA: Diagnosis not present

## 2013-06-28 DIAGNOSIS — I10 Essential (primary) hypertension: Secondary | ICD-10-CM

## 2013-06-28 DIAGNOSIS — Z7901 Long term (current) use of anticoagulants: Secondary | ICD-10-CM | POA: Diagnosis not present

## 2013-06-28 DIAGNOSIS — E785 Hyperlipidemia, unspecified: Secondary | ICD-10-CM

## 2013-06-28 NOTE — Patient Instructions (Signed)
Continue all current medications. Your physician wants you to follow up in: 6 months.  You will receive a reminder letter in the mail one-two months in advance.  If you don't receive a letter, please call our office to schedule the follow up appointment   

## 2013-06-28 NOTE — Progress Notes (Signed)
Patient ID: Melanie [name], female   DOB: [birth year], 79 y.o.   MRN: [id number]      SUBJECTIVE: Melanie [name] has a h/o medically managed CAD, HTN, paroxysmal atrial fibrillation, dyslipidemia, and sick sinus syndrome with a pacemaker and relatively recent generator change.  She is now on amlodipine and her BP is controlled and she has had no problems with leg edema.  She also denies chest pain, palpitations, and shortness of breath. She says since her generator change, she's not been able to lie down on her left side.   Allergies  Allergen Reactions  . Amlodipine Swelling  . Ciprofloxacin Other (See Comments)    Unknown   . Hctz [Hydrochlorothiazide] Other (See Comments)    Unknown   . Lopid [Gemfibrozil] Other (See Comments)    Unknown      Current Outpatient Prescriptions  Medication Sig Dispense Refill  . ALPRAZolam (XANAX) 0.25 MG tablet Take 0.25 mg by mouth at bedtime as needed for sleep.      Marland Kitchen amiodarone (PACERONE) 200 MG tablet TAKE 1/2 TABLET BY MOUTH DAILY  15 tablet  3  . amLODipine (NORVASC) 5 MG tablet Take 5 mg by mouth daily.      . clonazePAM (KLONOPIN) 0.5 MG tablet Take 0.5 mg by mouth at bedtime.       . cloNIDine (CATAPRES) 0.1 MG tablet Take 0.1 mg by mouth daily as needed (for blood pressure > 130).      . ezetimibe (ZETIA) 10 MG tablet Take 10 mg by mouth daily.      . isosorbide mononitrate (IMDUR) 30 MG 24 hr tablet Take 15 mg by mouth daily.      Marland Kitchen levothyroxine (SYNTHROID, LEVOTHROID) 88 MCG tablet Take 88 mcg by mouth daily before breakfast.      . pantoprazole (PROTONIX) 40 MG tablet Take 40 mg by mouth daily.       . raloxifene (EVISTA) 60 MG tablet Take 60 mg by mouth daily.        . rosuvastatin (CRESTOR) 10 MG tablet Take 10 mg by mouth at bedtime.      Marland Kitchen warfarin (COUMADIN) 2 MG tablet Take 2 mg by mouth See admin instructions. Take 1 tablet by mouth every day except Friday. Do not take any Warfarin on Friday       No current  facility-administered medications for this visit.    Past Medical History  Diagnosis Date  . Vertigo   . Paroxysmal atrial fibrillation     on coumadin and amiodarone  . Sinus node dysfunction     s/p Medtronic dual-chamber pacemaker  . Hypercalcemia   . Hypertension   . Dyslipidemia   . CAD (coronary artery disease)     Two vessel, quiescent; 70% LAD aretery disease and well collateralized, 100% RCA by cardiac cath in 2004; NL LVF; low risk Cardioloite; EF 74%, 11/10  . Hypothyroidism     Past Surgical History  Procedure Laterality Date  . Pacemaker insertion  05/24/03    MDT Kappa    History   Social History  . Marital Status: Single    Spouse Name: N/A    Number of Children: N/A  . Years of Education: N/A   Occupational History  . Not on file.   Social History Main Topics  . Smoking status: Never Smoker   . Smokeless tobacco: Never Used  . Alcohol Use: No  . Drug Use: No  . Sexual Activity: Not on file   Other  Topics Concern  . Not on file   Social History Narrative  . No narrative on file     Filed Vitals:   06/28/13 1342  BP: 122/82  Pulse: 84  Height: 5' 3.5" (1.613 m)  Weight: 153 lb 12.8 oz (69.763 kg)    PHYSICAL EXAM General: NAD Neck: No JVD, no thyromegaly or thyroid nodule.  Lungs: Clear to auscultation bilaterally with normal respiratory effort. CV: Nondisplaced PMI.  Heart regular S1/S2, no S3/S4, no murmur.  No peripheral edema.  No carotid bruit.  Normal pedal pulses.  Abdomen: Soft, nontender, no hepatosplenomegaly, no distention.  Neurologic: Alert and oriented x 3.  Psych: Normal affect. Extremities: No clubbing or cyanosis.   ECG: reviewed and available in electronic records.      ASSESSMENT AND PLAN: 1. CAD: quiescent on current medical therapy, which includes Crestor, Imdur, and amlodipine. 2. HTN: controlled on amlodipine. 3. Hyperlipidemia: on Crestor. 4. Paroxysmal atrial fibrillation: maintained on amiodarone and  warfarin, without difficulties.   Prentice Docker, M.D., F.A.C.C.

## 2013-07-17 DIAGNOSIS — I4891 Unspecified atrial fibrillation: Secondary | ICD-10-CM | POA: Diagnosis not present

## 2013-07-23 ENCOUNTER — Telehealth: Payer: Self-pay | Admitting: Internal Medicine

## 2013-07-23 NOTE — Telephone Encounter (Signed)
Tried to call but busy signal/kwm

## 2013-07-23 NOTE — Telephone Encounter (Signed)
New Problem    Pt needs a call back about her device.  Pt was to know if she should do phone checks.  Pt needs a call back please.   Thanks!

## 2013-07-23 NOTE — Telephone Encounter (Signed)
Spoke w/pt and explained on how to use Carelink transmitter/kwm

## 2013-08-14 DIAGNOSIS — I4891 Unspecified atrial fibrillation: Secondary | ICD-10-CM | POA: Diagnosis not present

## 2013-08-27 ENCOUNTER — Encounter: Payer: Self-pay | Admitting: Internal Medicine

## 2013-08-27 ENCOUNTER — Ambulatory Visit (INDEPENDENT_AMBULATORY_CARE_PROVIDER_SITE_OTHER): Payer: Medicare Other | Admitting: *Deleted

## 2013-08-27 DIAGNOSIS — I4891 Unspecified atrial fibrillation: Secondary | ICD-10-CM | POA: Diagnosis not present

## 2013-08-27 LAB — MDC_IDC_ENUM_SESS_TYPE_REMOTE
Battery Voltage: 2.8 V
Brady Statistic AP VS Percent: 51.2 %
Lead Channel Pacing Threshold Amplitude: 0.5 V
Lead Channel Pacing Threshold Amplitude: 1 V
Lead Channel Pacing Threshold Pulse Width: 0.4 ms
Lead Channel Pacing Threshold Pulse Width: 0.4 ms
Lead Channel Sensing Intrinsic Amplitude: 22.4 mV
Lead Channel Setting Pacing Pulse Width: 0.4 ms

## 2013-08-30 DIAGNOSIS — G4700 Insomnia, unspecified: Secondary | ICD-10-CM | POA: Diagnosis not present

## 2013-08-30 DIAGNOSIS — G2581 Restless legs syndrome: Secondary | ICD-10-CM | POA: Diagnosis not present

## 2013-09-11 DIAGNOSIS — I4891 Unspecified atrial fibrillation: Secondary | ICD-10-CM | POA: Diagnosis not present

## 2013-09-28 ENCOUNTER — Encounter: Payer: Self-pay | Admitting: *Deleted

## 2013-10-09 DIAGNOSIS — R300 Dysuria: Secondary | ICD-10-CM | POA: Diagnosis not present

## 2013-10-09 DIAGNOSIS — I4891 Unspecified atrial fibrillation: Secondary | ICD-10-CM | POA: Diagnosis not present

## 2013-11-06 DIAGNOSIS — N390 Urinary tract infection, site not specified: Secondary | ICD-10-CM | POA: Diagnosis not present

## 2013-11-06 DIAGNOSIS — I4891 Unspecified atrial fibrillation: Secondary | ICD-10-CM | POA: Diagnosis not present

## 2013-11-23 DIAGNOSIS — N390 Urinary tract infection, site not specified: Secondary | ICD-10-CM | POA: Diagnosis not present

## 2013-11-26 ENCOUNTER — Other Ambulatory Visit: Payer: Self-pay

## 2013-11-26 DIAGNOSIS — N390 Urinary tract infection, site not specified: Secondary | ICD-10-CM | POA: Diagnosis not present

## 2013-11-26 MED ORDER — AMIODARONE HCL 200 MG PO TABS: ORAL_TABLET | ORAL | Status: DC

## 2013-11-28 ENCOUNTER — Encounter: Payer: Self-pay | Admitting: Internal Medicine

## 2013-11-28 ENCOUNTER — Ambulatory Visit (INDEPENDENT_AMBULATORY_CARE_PROVIDER_SITE_OTHER): Payer: Medicare Other | Admitting: *Deleted

## 2013-11-28 DIAGNOSIS — N3289 Other specified disorders of bladder: Secondary | ICD-10-CM | POA: Diagnosis not present

## 2013-11-28 DIAGNOSIS — R351 Nocturia: Secondary | ICD-10-CM | POA: Diagnosis not present

## 2013-11-28 DIAGNOSIS — I4891 Unspecified atrial fibrillation: Secondary | ICD-10-CM

## 2013-11-28 DIAGNOSIS — N390 Urinary tract infection, site not specified: Secondary | ICD-10-CM | POA: Diagnosis not present

## 2013-11-28 DIAGNOSIS — R3915 Urgency of urination: Secondary | ICD-10-CM | POA: Diagnosis not present

## 2013-11-28 DIAGNOSIS — N9089 Other specified noninflammatory disorders of vulva and perineum: Secondary | ICD-10-CM | POA: Diagnosis not present

## 2013-11-28 LAB — MDC_IDC_ENUM_SESS_TYPE_REMOTE
Battery Remaining Longevity: 159 mo
Battery Voltage: 2.79 V
Brady Statistic AP VP Percent: 7 %
Brady Statistic AP VS Percent: 51 %
Brady Statistic AS VP Percent: 4 %
Date Time Interrogation Session: 20150304143413
Lead Channel Pacing Threshold Amplitude: 0.375 V
Lead Channel Pacing Threshold Amplitude: 1.375 V
Lead Channel Pacing Threshold Pulse Width: 0.4 ms
Lead Channel Sensing Intrinsic Amplitude: 16 mV
Lead Channel Sensing Intrinsic Amplitude: 2.8 mV
Lead Channel Setting Pacing Pulse Width: 0.4 ms
MDC IDC MSMT BATTERY IMPEDANCE: 100 Ohm
MDC IDC MSMT LEADCHNL RA IMPEDANCE VALUE: 483 Ohm
MDC IDC MSMT LEADCHNL RV IMPEDANCE VALUE: 920 Ohm
MDC IDC MSMT LEADCHNL RV PACING THRESHOLD PULSEWIDTH: 0.4 ms
MDC IDC SET LEADCHNL RA PACING AMPLITUDE: 2 V
MDC IDC SET LEADCHNL RV PACING AMPLITUDE: 2.75 V
MDC IDC SET LEADCHNL RV SENSING SENSITIVITY: 4 mV
MDC IDC STAT BRADY AS VS PERCENT: 38 %

## 2013-11-29 DIAGNOSIS — N390 Urinary tract infection, site not specified: Secondary | ICD-10-CM | POA: Diagnosis not present

## 2013-11-30 DIAGNOSIS — Z1231 Encounter for screening mammogram for malignant neoplasm of breast: Secondary | ICD-10-CM | POA: Diagnosis not present

## 2013-12-04 DIAGNOSIS — I4891 Unspecified atrial fibrillation: Secondary | ICD-10-CM | POA: Diagnosis not present

## 2013-12-06 DIAGNOSIS — Z961 Presence of intraocular lens: Secondary | ICD-10-CM | POA: Diagnosis not present

## 2013-12-06 DIAGNOSIS — H353190 Nonexudative age-related macular degeneration, unspecified eye, stage unspecified: Secondary | ICD-10-CM | POA: Diagnosis not present

## 2013-12-06 DIAGNOSIS — H43399 Other vitreous opacities, unspecified eye: Secondary | ICD-10-CM | POA: Diagnosis not present

## 2013-12-06 DIAGNOSIS — H524 Presbyopia: Secondary | ICD-10-CM | POA: Diagnosis not present

## 2013-12-12 DIAGNOSIS — R3915 Urgency of urination: Secondary | ICD-10-CM | POA: Diagnosis not present

## 2013-12-12 DIAGNOSIS — N9089 Other specified noninflammatory disorders of vulva and perineum: Secondary | ICD-10-CM | POA: Diagnosis not present

## 2013-12-12 DIAGNOSIS — R351 Nocturia: Secondary | ICD-10-CM | POA: Diagnosis not present

## 2013-12-12 DIAGNOSIS — N3289 Other specified disorders of bladder: Secondary | ICD-10-CM | POA: Diagnosis not present

## 2013-12-18 DIAGNOSIS — H353190 Nonexudative age-related macular degeneration, unspecified eye, stage unspecified: Secondary | ICD-10-CM | POA: Diagnosis not present

## 2013-12-25 ENCOUNTER — Ambulatory Visit (INDEPENDENT_AMBULATORY_CARE_PROVIDER_SITE_OTHER): Payer: Medicare Other | Admitting: Cardiovascular Disease

## 2013-12-25 ENCOUNTER — Encounter: Payer: Self-pay | Admitting: Cardiovascular Disease

## 2013-12-25 VITALS — BP 130/80 | HR 81 | Ht 63.5 in | Wt 159.0 lb

## 2013-12-25 DIAGNOSIS — Z79899 Other long term (current) drug therapy: Secondary | ICD-10-CM

## 2013-12-25 DIAGNOSIS — I2510 Atherosclerotic heart disease of native coronary artery without angina pectoris: Secondary | ICD-10-CM

## 2013-12-25 DIAGNOSIS — Z7901 Long term (current) use of anticoagulants: Secondary | ICD-10-CM | POA: Diagnosis not present

## 2013-12-25 DIAGNOSIS — I4891 Unspecified atrial fibrillation: Secondary | ICD-10-CM

## 2013-12-25 DIAGNOSIS — I10 Essential (primary) hypertension: Secondary | ICD-10-CM | POA: Diagnosis not present

## 2013-12-25 DIAGNOSIS — Z950 Presence of cardiac pacemaker: Secondary | ICD-10-CM

## 2013-12-25 DIAGNOSIS — E785 Hyperlipidemia, unspecified: Secondary | ICD-10-CM

## 2013-12-25 DIAGNOSIS — E039 Hypothyroidism, unspecified: Secondary | ICD-10-CM

## 2013-12-25 NOTE — Patient Instructions (Signed)
   Labs for TSH, free T4, lipids, liver function - orders given today Office will contact with results via phone or letter.   Continue all current medications. Your physician wants you to follow up in: 6 months.  You will receive a reminder letter in the mail one-two months in advance.  If you don't receive a letter, please call our office to schedule the follow up appointment

## 2013-12-25 NOTE — Addendum Note (Signed)
Addended by: Laurine Blazer on: 12/25/2013 10:10 AM   Modules accepted: Orders

## 2013-12-25 NOTE — Progress Notes (Signed)
Patient ID: Melanie [name], female   DOB: 02-08-1926, 80 y.o.   MRN: [id number]      SUBJECTIVE: Melanie [name] has a h/o medically managed CAD, HTN, paroxysmal atrial fibrillation, dyslipidemia, and sick sinus syndrome with a pacemaker. Her pacemaker was most recently evaluated in December 2014 which showed that she was in atrial fibrillation 1% of the time. No high ventricular rates were detected. She denies chest pain, shortness of breath, lightheadedness, dizziness, and syncope. She very seldom has some very mild leg swelling.     Allergies  Allergen Reactions  . Ciprofloxacin Other (See Comments)    Unknown   . Hctz [Hydrochlorothiazide] Other (See Comments)    Unknown   . Lopid [Gemfibrozil] Other (See Comments)    Unknown      Current Outpatient Prescriptions  Medication Sig Dispense Refill  . amiodarone (PACERONE) 200 MG tablet TAKE 1/2 TABLET BY MOUTH DAILY  15 tablet  3  . amLODipine (NORVASC) 5 MG tablet Take 5 mg by mouth daily.      . clonazePAM (KLONOPIN) 0.5 MG tablet Take 0.5-1 mg by mouth at bedtime.       Marland Kitchen ezetimibe (ZETIA) 10 MG tablet Take 10 mg by mouth daily.      . isosorbide mononitrate (IMDUR) 30 MG 24 hr tablet Take 15 mg by mouth daily.      Marland Kitchen levothyroxine (SYNTHROID, LEVOTHROID) 88 MCG tablet Take 88 mcg by mouth daily before breakfast.      . pantoprazole (PROTONIX) 40 MG tablet Take 40 mg by mouth 2 (two) times daily.       . raloxifene (EVISTA) 60 MG tablet Take 60 mg by mouth daily.        . rosuvastatin (CRESTOR) 10 MG tablet Take 10 mg by mouth at bedtime.      Marland Kitchen warfarin (COUMADIN) 2 MG tablet Take 2 mg by mouth See admin instructions. Take 1 tablet by mouth every day except Friday. Do not take any Warfarin on Friday       No current facility-administered medications for this visit.    Past Medical History  Diagnosis Date  . Vertigo   . Paroxysmal atrial fibrillation     on coumadin and amiodarone  . Sinus node dysfunction     s/p Medtronic  dual-chamber pacemaker  . Hypercalcemia   . Hypertension   . Dyslipidemia   . CAD (coronary artery disease)     Two vessel, quiescent; 70% LAD aretery disease and well collateralized, 100% RCA by cardiac cath in 2004; NL LVF; low risk Cardioloite; EF 74%, 11/10  . Hypothyroidism     Past Surgical History  Procedure Laterality Date  . Pacemaker insertion  05/24/03    MDT Kappa    History   Social History  . Marital Status: Single    Spouse Name: N/A    Number of Children: N/A  . Years of Education: N/A   Occupational History  . Not on file.   Social History Main Topics  . Smoking status: Never Smoker   . Smokeless tobacco: Never Used  . Alcohol Use: No  . Drug Use: No  . Sexual Activity: Not on file   Other Topics Concern  . Not on file   Social History Narrative  . No narrative on file     Filed Vitals:   12/25/13 0947  BP: 130/80  Pulse: 81  Height: 5' 3.5" (1.613 m)  Weight: 159 lb (72.122 kg)    PHYSICAL EXAM  General: NAD Neck: No JVD, no thyromegaly. Lungs: Clear to auscultation bilaterally with normal respiratory effort. CV: Nondisplaced PMI.  Regular rate and rhythm, normal S1/S2, no S3/S4, no murmur. No pretibial or periankle edema.  No carotid bruit.  Normal pedal pulses.  Abdomen: Soft, nontender, no hepatosplenomegaly, no distention.  Neurologic: Alert and oriented x 3.  Psych: Normal affect. Extremities: No clubbing or cyanosis.   ECG: reviewed and available in electronic records.      ASSESSMENT AND PLAN: 1. CAD: quiescent on current medical therapy, which includes Crestor, Imdur, and amlodipine.  2. HTN: controlled on amlodipine.  3. Hyperlipidemia: on Crestor. I will check lipids if they haven't been recently checked by her PCP, Melanie [name]. 4. Paroxysmal atrial fibrillation: maintained on amiodarone and warfarin, without difficulties. I will check thyroid function studies and LFT's.  Dispo: f/u 6 months.  Melanie [name], M.D.,  F.A.C.C.

## 2013-12-26 ENCOUNTER — Encounter: Payer: Self-pay | Admitting: *Deleted

## 2013-12-26 DIAGNOSIS — I4891 Unspecified atrial fibrillation: Secondary | ICD-10-CM | POA: Diagnosis not present

## 2013-12-26 DIAGNOSIS — I2510 Atherosclerotic heart disease of native coronary artery without angina pectoris: Secondary | ICD-10-CM | POA: Diagnosis not present

## 2013-12-26 DIAGNOSIS — E039 Hypothyroidism, unspecified: Secondary | ICD-10-CM | POA: Diagnosis not present

## 2013-12-26 DIAGNOSIS — Z79899 Other long term (current) drug therapy: Secondary | ICD-10-CM | POA: Diagnosis not present

## 2013-12-26 DIAGNOSIS — E785 Hyperlipidemia, unspecified: Secondary | ICD-10-CM | POA: Diagnosis not present

## 2014-01-01 ENCOUNTER — Telehealth: Payer: Self-pay | Admitting: *Deleted

## 2014-01-01 NOTE — Telephone Encounter (Signed)
Notes Recorded by Laurine Blazer, LPN on 0/11/8331 at 8:32 PM Patient notified and verbalized understanding.

## 2014-01-01 NOTE — Telephone Encounter (Signed)
Message copied by Laurine Blazer on Tue Jan 01, 2014  5:05 PM ------      Message from: Kate Sable A      Created: Thu Dec 27, 2013 11:44 AM       Normal lft's, tft's, and lipids. ------

## 2014-01-02 DIAGNOSIS — I4891 Unspecified atrial fibrillation: Secondary | ICD-10-CM | POA: Diagnosis not present

## 2014-01-14 DIAGNOSIS — N9089 Other specified noninflammatory disorders of vulva and perineum: Secondary | ICD-10-CM | POA: Diagnosis not present

## 2014-01-14 DIAGNOSIS — Z87440 Personal history of urinary (tract) infections: Secondary | ICD-10-CM | POA: Diagnosis not present

## 2014-01-14 DIAGNOSIS — N3289 Other specified disorders of bladder: Secondary | ICD-10-CM | POA: Diagnosis not present

## 2014-01-22 DIAGNOSIS — S8010XA Contusion of unspecified lower leg, initial encounter: Secondary | ICD-10-CM | POA: Diagnosis not present

## 2014-01-24 DIAGNOSIS — M7989 Other specified soft tissue disorders: Secondary | ICD-10-CM | POA: Diagnosis not present

## 2014-01-30 DIAGNOSIS — I4891 Unspecified atrial fibrillation: Secondary | ICD-10-CM | POA: Diagnosis not present

## 2014-02-01 ENCOUNTER — Emergency Department (HOSPITAL_COMMUNITY)
Admission: EM | Admit: 2014-02-01 | Discharge: 2014-02-02 | Disposition: A | Discharge status: Home / Self Care | Payer: Medicare Other | Attending: Emergency Medicine | Admitting: Emergency Medicine

## 2014-02-01 ENCOUNTER — Encounter (HOSPITAL_COMMUNITY): Payer: Self-pay | Admitting: Emergency Medicine

## 2014-02-01 DIAGNOSIS — M7989 Other specified soft tissue disorders: Secondary | ICD-10-CM | POA: Diagnosis not present

## 2014-02-01 DIAGNOSIS — Z7901 Long term (current) use of anticoagulants: Secondary | ICD-10-CM | POA: Diagnosis not present

## 2014-02-01 DIAGNOSIS — E785 Hyperlipidemia, unspecified: Secondary | ICD-10-CM | POA: Insufficient documentation

## 2014-02-01 DIAGNOSIS — E039 Hypothyroidism, unspecified: Secondary | ICD-10-CM | POA: Insufficient documentation

## 2014-02-01 DIAGNOSIS — X58XXXA Exposure to other specified factors, initial encounter: Secondary | ICD-10-CM | POA: Insufficient documentation

## 2014-02-01 DIAGNOSIS — Z950 Presence of cardiac pacemaker: Secondary | ICD-10-CM | POA: Insufficient documentation

## 2014-02-01 DIAGNOSIS — I2510 Atherosclerotic heart disease of native coronary artery without angina pectoris: Secondary | ICD-10-CM | POA: Diagnosis not present

## 2014-02-01 DIAGNOSIS — S8010XA Contusion of unspecified lower leg, initial encounter: Secondary | ICD-10-CM | POA: Diagnosis not present

## 2014-02-01 DIAGNOSIS — I10 Essential (primary) hypertension: Secondary | ICD-10-CM | POA: Insufficient documentation

## 2014-02-01 DIAGNOSIS — I4891 Unspecified atrial fibrillation: Secondary | ICD-10-CM | POA: Insufficient documentation

## 2014-02-01 DIAGNOSIS — Y9389 Activity, other specified: Secondary | ICD-10-CM | POA: Insufficient documentation

## 2014-02-01 DIAGNOSIS — Y929 Unspecified place or not applicable: Secondary | ICD-10-CM | POA: Insufficient documentation

## 2014-02-01 DIAGNOSIS — T148XXA Other injury of unspecified body region, initial encounter: Secondary | ICD-10-CM

## 2014-02-01 NOTE — ED Notes (Signed)
Pain, swelling rt lower leg, Injury 2 weeks ago. Pt says increase pain  And  discoloration

## 2014-02-02 ENCOUNTER — Other Ambulatory Visit (HOSPITAL_COMMUNITY): Payer: Self-pay | Admitting: Emergency Medicine

## 2014-02-02 ENCOUNTER — Ambulatory Visit (HOSPITAL_COMMUNITY)
Admission: RE | Admit: 2014-02-02 | Discharge: 2014-02-02 | Disposition: A | Discharge status: Home / Self Care | Payer: Medicare Other | Source: Ambulatory Visit | Attending: Emergency Medicine | Admitting: Emergency Medicine

## 2014-02-02 ENCOUNTER — Inpatient Hospital Stay (HOSPITAL_COMMUNITY): Admission: RE | Admit: 2014-02-02 | Payer: Medicare Other | Source: Ambulatory Visit

## 2014-02-02 DIAGNOSIS — M79604 Pain in right leg: Secondary | ICD-10-CM

## 2014-02-02 DIAGNOSIS — Z87828 Personal history of other (healed) physical injury and trauma: Secondary | ICD-10-CM | POA: Insufficient documentation

## 2014-02-02 DIAGNOSIS — M7989 Other specified soft tissue disorders: Secondary | ICD-10-CM | POA: Diagnosis not present

## 2014-02-02 DIAGNOSIS — M79609 Pain in unspecified limb: Secondary | ICD-10-CM | POA: Insufficient documentation

## 2014-02-02 LAB — CBC WITH DIFFERENTIAL/PLATELET
Basophils Absolute: 0 10*3/uL (ref 0.0–0.1)
Basophils Relative: 0 % (ref 0–1)
EOS ABS: 0.1 10*3/uL (ref 0.0–0.7)
Eosinophils Relative: 2 % (ref 0–5)
HCT: 37.9 % (ref 36.0–46.0)
HEMOGLOBIN: 12.5 g/dL (ref 12.0–15.0)
LYMPHS ABS: 1.9 10*3/uL (ref 0.7–4.0)
LYMPHS PCT: 40 % (ref 12–46)
MCH: 30.1 pg (ref 26.0–34.0)
MCHC: 33 g/dL (ref 30.0–36.0)
MCV: 91.3 fL (ref 78.0–100.0)
MONOS PCT: 14 % — AB (ref 3–12)
Monocytes Absolute: 0.7 10*3/uL (ref 0.1–1.0)
NEUTROS ABS: 2.1 10*3/uL (ref 1.7–7.7)
NEUTROS PCT: 44 % (ref 43–77)
Platelets: 150 10*3/uL (ref 150–400)
RBC: 4.15 MIL/uL (ref 3.87–5.11)
RDW: 13.9 % (ref 11.5–15.5)
WBC: 4.9 10*3/uL (ref 4.0–10.5)

## 2014-02-02 LAB — BASIC METABOLIC PANEL
BUN: 13 mg/dL (ref 6–23)
CHLORIDE: 102 meq/L (ref 96–112)
CO2: 25 meq/L (ref 19–32)
Calcium: 10.1 mg/dL (ref 8.4–10.5)
Creatinine, Ser: 0.98 mg/dL (ref 0.50–1.10)
GFR calc Af Amer: 59 mL/min — ABNORMAL LOW (ref >90)
GFR calc non Af Amer: 51 mL/min — ABNORMAL LOW (ref >90)
GLUCOSE: 134 mg/dL — AB (ref 70–99)
POTASSIUM: 4 meq/L (ref 3.7–5.3)
Sodium: 138 mEq/L (ref 137–147)

## 2014-02-02 LAB — PROTIME-INR
INR: 1.77 — AB (ref 0.00–1.49)
Prothrombin Time: 20.1 seconds — ABNORMAL HIGH (ref 11.6–15.2)

## 2014-02-02 LAB — APTT: APTT: 47 s — AB (ref 24–37)

## 2014-02-02 MED ORDER — CEPHALEXIN 250 MG PO CAPS: 250.0000 mg | ORAL_CAPSULE | Freq: Four times a day (QID) | ORAL | Status: DC

## 2014-02-02 NOTE — ED Provider Notes (Signed)
CSN: [id number]     Arrival date & time 02/01/14  2239 History   First MD Initiated Contact with Patient 02/01/14 2349    This chart was scribed for Teressa Lower, MD by Terressa Koyanagi, ED Scribe. This patient was seen in room APA03/AP[facility]03 and the patient's care was started at 12:01 AM.  Chief Complaint  Patient presents with  . Leg Pain   The history is provided by the patient. No language interpreter was used.   HPI Comments: Melanie [name] is a 80 y.o. female who presents to the Emergency Department complaining of swelling of her right, lower leg with associated bruising resulting from an injury onset 2 weeks ago. Pt reports that she is currently on Coumadin and was on it at the time of the accident. Pt reports that imaging was done of the injured area at [place]. Pt denies fever, nausea and vomiting. Pt reports that she has been icing the injured area and has been trying to stay off of it as much as possible.   Past Medical History  Diagnosis Date  . Vertigo   . Paroxysmal atrial fibrillation     on coumadin and amiodarone  . Sinus node dysfunction     s/p Medtronic dual-chamber pacemaker  . Hypercalcemia   . Hypertension   . Dyslipidemia   . CAD (coronary artery disease)     Two vessel, quiescent; 70% LAD aretery disease and well collateralized, 100% RCA by cardiac cath in 2004; NL LVF; low risk Cardioloite; EF 74%, 11/10  . Hypothyroidism    Past Surgical History  Procedure Laterality Date  . Pacemaker insertion  05/24/03    MDT Kappa  . Abdominal hysterectomy    . Fracture surgery     Family History  Problem Relation Age of Onset  . Hypertension Neg Hx   . Diabetes Neg Hx   . Coronary artery disease Neg Hx    History  Substance Use Topics  . Smoking status: Never Smoker   . Smokeless tobacco: Never Used  . Alcohol Use: No   OB History   Grav Para Term Preterm Abortions TAB SAB Ect Mult Living                 Review of Systems  Constitutional: Negative for fever.   Gastrointestinal: Negative for nausea and vomiting.  Musculoskeletal:       Right leg swelling and associated bruising.   All other systems reviewed and are negative.     Allergies  Ciprofloxacin; Hctz; and Lopid  Home Medications   Prior to Admission medications   Medication Sig Start Date End Date Taking? Authorizing Provider  amiodarone (PACERONE) 200 MG tablet TAKE 1/2 TABLET BY MOUTH DAILY 11/26/13   Thompson Grayer, MD  amLODipine (NORVASC) 5 MG tablet Take 5 mg by mouth daily.    Historical Provider, MD  clonazePAM (KLONOPIN) 0.5 MG tablet Take 0.5-1 mg by mouth at bedtime.     Historical Provider, MD  ezetimibe (ZETIA) 10 MG tablet Take 10 mg by mouth daily.    Historical Provider, MD  isosorbide mononitrate (IMDUR) 30 MG 24 hr tablet Take 15 mg by mouth daily.    Historical Provider, MD  levothyroxine (SYNTHROID, LEVOTHROID) 88 MCG tablet Take 88 mcg by mouth daily before breakfast.    Historical Provider, MD  pantoprazole (PROTONIX) 40 MG tablet Take 40 mg by mouth 2 (two) times daily.     Historical Provider, MD  raloxifene (EVISTA) 60 MG tablet Take 60  mg by mouth daily.      Historical Provider, MD  rosuvastatin (CRESTOR) 10 MG tablet Take 10 mg by mouth at bedtime.    Historical Provider, MD  warfarin (COUMADIN) 2 MG tablet Take 2 mg by mouth See admin instructions. Take 1 tablet by mouth every day except Friday. Do not take any Warfarin on Friday    Historical Provider, MD   Triage Vitals: BP 171/75  Pulse 77  Temp(Src) 98.2 F (36.8 C) (Oral)  Resp 20  Ht 5\' 3"  (1.6 m)  Wt 160 lb (72.576 kg)  BMI 28.35 kg/m2  SpO2 96% Physical Exam  Nursing note and vitals reviewed. Constitutional: She is oriented to person, place, and time. She appears well-developed and well-nourished. No distress.  HENT:  Head: Normocephalic and atraumatic.  Eyes: EOM are normal.  Neck: Neck supple. No tracheal deviation present.  Cardiovascular: Normal rate.   Pulmonary/Chest: Effort  normal. No respiratory distress.  Musculoskeletal: Normal range of motion.  Moderate sized hematoma to the proximal tibfib area with mild tenderness and mild increased warmth to touch. Some surrounding erythema. No fluctulance, distal pulses and motor sensory intact.  Swelling around the lateral aspect of the calf.    Neurological: She is alert and oriented to person, place, and time.  Skin: Skin is warm and dry.  Psychiatric: She has a normal mood and affect. Her behavior is normal.    ED Course  Procedures (including critical care time) DIAGNOSTIC STUDIES: Oxygen Saturation is 96% on RA, adequate by my interpretation.    COORDINATION OF CARE:  12:04 AM-Discussed treatment plan which includes antibiotics, order for an ultrasound, and labs with pt at bedside and pt agreed to plan.   Labs Review Labs Reviewed  CBC WITH DIFFERENTIAL - Abnormal; Notable for the following:    Monocytes Relative 14 (*)    All other components within normal limits  BASIC METABOLIC PANEL - Abnormal; Notable for the following:    Glucose, Bld 134 (*)    GFR calc non Af Amer 51 (*)    GFR calc Af Amer 59 (*)    All other components within normal limits  PROTIME-INR - Abnormal; Notable for the following:    Prothrombin Time 20.1 (*)    INR 1.77 (*)    All other components within normal limits  APTT - Abnormal; Notable for the following:    aPTT 47 (*)    All other components within normal limits    PT declines any pain medication - she is very worried about infection. Keflex provided.   [place] ordered and PT will return tomorrow for an [place] when tech is available. Stable and appropriate for discharge home.  MDM   Dx: Hematoma RLE, anticoagulation with coumadin  Labs obtained/ reviewed VS and nurses notes reviewed and considered.   I personally performed the services described in this documentation, which was scribed in my presence. The recorded information has been reviewed and is  accurate.     Teressa Lower, MD 02/02/14 [phone]

## 2014-02-02 NOTE — Discharge Instructions (Signed)
Hematoma A hematoma is a collection of blood under the skin, in an organ, in a body space, in a joint space, or in other tissue. The blood can clot to form a lump that you can see and feel. The lump is often firm and may sometimes become sore and tender. Most hematomas get better in a few days to weeks. However, some hematomas may be serious and require medical care. Hematomas can range in size from very small to very large. CAUSES  A hematoma can be caused by a blunt or penetrating injury. It can also be caused by spontaneous leakage from a blood vessel under the skin. Spontaneous leakage from a blood vessel is more likely to occur in older people, especially those taking blood thinners. Sometimes, a hematoma can develop after certain medical procedures. SIGNS AND SYMPTOMS   A firm lump on the body.  Possible pain and tenderness in the area.  Bruising.Blue, dark blue, purple-red, or yellowish skin may appear at the site of the hematoma if the hematoma is close to the surface of the skin. For hematomas in deeper tissues or body spaces, the signs and symptoms may be subtle. For example, an intra-abdominal hematoma may cause abdominal pain, weakness, fainting, and shortness of breath. An intracranial hematoma may cause a headache or symptoms such as weakness, trouble speaking, or a change in consciousness. DIAGNOSIS  A hematoma can usually be diagnosed based on your medical history and a physical exam. Imaging tests may be needed if your health care provider suspects a hematoma in deeper tissues or body spaces, such as the abdomen, head, or chest. These tests may include ultrasonography or a CT scan.  TREATMENT  Hematomas usually go away on their own over time. Rarely does the blood need to be drained out of the body. Large hematomas or those that may affect vital organs will sometimes need surgical drainage or monitoring. HOME CARE INSTRUCTIONS   Apply ice to the injured area:   Put ice in a  plastic bag.   Place a towel between your skin and the bag.   Leave the ice on for 20 minutes, 2 3 times a day for the first 1 to 2 days.   After the first 2 days, switch to using warm compresses on the hematoma.   Elevate the injured area to help decrease pain and swelling. Wrapping the area with an elastic bandage may also be helpful. Compression helps to reduce swelling and promotes shrinking of the hematoma. Make sure the bandage is not wrapped too tight.   If your hematoma is on a lower extremity and is painful, crutches may be helpful for a couple days.   Only take over-the-counter or prescription medicines as directed by your health care provider. SEEK IMMEDIATE MEDICAL CARE IF:   You have increasing pain, or your pain is not controlled with medicine.   You have a fever.   You have worsening swelling or discoloration.   Your skin over the hematoma breaks or starts bleeding.   Your hematoma is in your chest or abdomen and you have weakness, shortness of breath, or a change in consciousness.  Your hematoma is on your scalp (caused by a fall or injury) and you have a worsening headache or a change in alertness or consciousness. MAKE SURE YOU:   Understand these instructions.  Will watch your condition.  Will get help right away if you are not doing well or get worse. Document Released: 04/27/2004 Document Revised: 05/16/2013 Document Reviewed:   02/21/2013 [employer] Patient Information 2014 [employer]  

## 2014-02-13 DIAGNOSIS — I4891 Unspecified atrial fibrillation: Secondary | ICD-10-CM | POA: Diagnosis not present

## 2014-03-01 DIAGNOSIS — I4891 Unspecified atrial fibrillation: Secondary | ICD-10-CM | POA: Diagnosis not present

## 2014-03-04 ENCOUNTER — Telehealth: Payer: Self-pay | Admitting: Cardiology

## 2014-03-04 ENCOUNTER — Encounter: Payer: Medicare Other | Admitting: *Deleted

## 2014-03-04 NOTE — Telephone Encounter (Signed)
Spoke with pt and reminded pt of remote transmission that is due today. Pt verbalized understanding.   

## 2014-03-05 ENCOUNTER — Encounter: Payer: Self-pay | Admitting: Cardiology

## 2014-03-13 DIAGNOSIS — H6120 Impacted cerumen, unspecified ear: Secondary | ICD-10-CM | POA: Diagnosis not present

## 2014-03-13 DIAGNOSIS — E039 Hypothyroidism, unspecified: Secondary | ICD-10-CM | POA: Diagnosis not present

## 2014-03-13 DIAGNOSIS — M810 Age-related osteoporosis without current pathological fracture: Secondary | ICD-10-CM | POA: Diagnosis not present

## 2014-03-13 DIAGNOSIS — K219 Gastro-esophageal reflux disease without esophagitis: Secondary | ICD-10-CM | POA: Diagnosis not present

## 2014-03-13 DIAGNOSIS — E785 Hyperlipidemia, unspecified: Secondary | ICD-10-CM | POA: Diagnosis not present

## 2014-03-13 DIAGNOSIS — I10 Essential (primary) hypertension: Secondary | ICD-10-CM | POA: Diagnosis not present

## 2014-03-13 DIAGNOSIS — Z0000 Encounter for general adult medical examination without abnormal findings: Secondary | ICD-10-CM | POA: Diagnosis not present

## 2014-03-13 DIAGNOSIS — G2589 Other specified extrapyramidal and movement disorders: Secondary | ICD-10-CM | POA: Diagnosis not present

## 2014-03-13 DIAGNOSIS — I4891 Unspecified atrial fibrillation: Secondary | ICD-10-CM | POA: Diagnosis not present

## 2014-04-01 DIAGNOSIS — I4891 Unspecified atrial fibrillation: Secondary | ICD-10-CM | POA: Diagnosis not present

## 2014-04-05 DIAGNOSIS — H6120 Impacted cerumen, unspecified ear: Secondary | ICD-10-CM | POA: Diagnosis not present

## 2014-04-05 DIAGNOSIS — I4891 Unspecified atrial fibrillation: Secondary | ICD-10-CM | POA: Diagnosis not present

## 2014-04-08 ENCOUNTER — Other Ambulatory Visit: Payer: Self-pay | Admitting: Internal Medicine

## 2014-04-11 DIAGNOSIS — I4891 Unspecified atrial fibrillation: Secondary | ICD-10-CM | POA: Diagnosis not present

## 2014-04-19 DIAGNOSIS — H70009 Acute mastoiditis without complications, unspecified ear: Secondary | ICD-10-CM | POA: Diagnosis not present

## 2014-05-02 DIAGNOSIS — I4891 Unspecified atrial fibrillation: Secondary | ICD-10-CM | POA: Diagnosis not present

## 2014-05-06 DIAGNOSIS — H9209 Otalgia, unspecified ear: Secondary | ICD-10-CM | POA: Diagnosis not present

## 2014-05-07 DIAGNOSIS — M549 Dorsalgia, unspecified: Secondary | ICD-10-CM | POA: Diagnosis not present

## 2014-05-07 DIAGNOSIS — M62838 Other muscle spasm: Secondary | ICD-10-CM | POA: Diagnosis not present

## 2014-05-24 ENCOUNTER — Encounter: Payer: Self-pay | Admitting: Internal Medicine

## 2014-05-24 ENCOUNTER — Ambulatory Visit (INDEPENDENT_AMBULATORY_CARE_PROVIDER_SITE_OTHER): Payer: Medicare Other | Admitting: Internal Medicine

## 2014-05-24 VITALS — BP 131/76 | HR 80 | Ht 64.0 in | Wt 154.0 lb

## 2014-05-24 DIAGNOSIS — I480 Paroxysmal atrial fibrillation: Secondary | ICD-10-CM

## 2014-05-24 DIAGNOSIS — I10 Essential (primary) hypertension: Secondary | ICD-10-CM

## 2014-05-24 DIAGNOSIS — I495 Sick sinus syndrome: Secondary | ICD-10-CM

## 2014-05-24 DIAGNOSIS — I4891 Unspecified atrial fibrillation: Secondary | ICD-10-CM | POA: Diagnosis not present

## 2014-05-24 DIAGNOSIS — I2510 Atherosclerotic heart disease of native coronary artery without angina pectoris: Secondary | ICD-10-CM

## 2014-05-24 DIAGNOSIS — Z950 Presence of cardiac pacemaker: Secondary | ICD-10-CM

## 2014-05-24 LAB — MDC_IDC_ENUM_SESS_TYPE_INCLINIC
Battery Remaining Longevity: 155 mo
Brady Statistic AP VS Percent: 60 %
Brady Statistic AS VP Percent: 2 %
Brady Statistic AS VS Percent: 34 %
Date Time Interrogation Session: 20150828134601
Lead Channel Pacing Threshold Amplitude: 0.5 V
Lead Channel Pacing Threshold Amplitude: 0.75 V
Lead Channel Pacing Threshold Pulse Width: 0.4 ms
Lead Channel Sensing Intrinsic Amplitude: 8 mV
Lead Channel Setting Pacing Amplitude: 2 V
Lead Channel Setting Pacing Pulse Width: 0.4 ms
MDC IDC MSMT BATTERY IMPEDANCE: 112 Ohm
MDC IDC MSMT BATTERY VOLTAGE: 2.79 V
MDC IDC MSMT LEADCHNL RA IMPEDANCE VALUE: 504 Ohm
MDC IDC MSMT LEADCHNL RA SENSING INTR AMPL: 2 mV
MDC IDC MSMT LEADCHNL RV IMPEDANCE VALUE: 905 Ohm
MDC IDC MSMT LEADCHNL RV PACING THRESHOLD PULSEWIDTH: 0.4 ms
MDC IDC SET LEADCHNL RV PACING AMPLITUDE: 2.5 V
MDC IDC SET LEADCHNL RV SENSING SENSITIVITY: 4 mV
MDC IDC STAT BRADY AP VP PERCENT: 4 %

## 2014-05-24 NOTE — Progress Notes (Signed)
PCP: Melanie [name] is a 80 y.o. female who presents today for routine electrophysiology followup.  Since her last visit, she has done well.  She has rare palpitations but no sustained arrhythmias.  Today, she denies symptoms of chest pain, shortness of breath,  lower extremity edema, dizziness, presyncope, or syncope.  The patient is otherwise without complaint today.   Past Medical History  Diagnosis Date  . Vertigo   . Paroxysmal atrial fibrillation     on coumadin and amiodarone  . Sinus node dysfunction     s/p Medtronic dual-chamber pacemaker  . Hypercalcemia   . Hypertension   . Dyslipidemia   . CAD (coronary artery disease)     Two vessel, quiescent; 70% LAD aretery disease and well collateralized, 100% RCA by cardiac cath in 2004; NL LVF; low risk Cardioloite; EF 74%, 11/10  . Hypothyroidism    Past Surgical History  Procedure Laterality Date  . Pacemaker insertion  05/24/03    MDT Kappa  . Abdominal hysterectomy    . Fracture surgery      Current Outpatient Prescriptions  Medication Sig Dispense Refill  . amiodarone (PACERONE) 200 MG tablet TAKE 1/2 TABLET BY MOUTH DAILY  15 tablet  6  . amLODipine (NORVASC) 5 MG tablet Take 5 mg by mouth daily.      . clonazePAM (KLONOPIN) 0.5 MG tablet Take 0.5 mg by mouth at bedtime.       Marland Kitchen ezetimibe (ZETIA) 10 MG tablet Take 10 mg by mouth daily.      . isosorbide mononitrate (IMDUR) 30 MG 24 hr tablet Take 15 mg by mouth daily.      Marland Kitchen levothyroxine (SYNTHROID, LEVOTHROID) 88 MCG tablet Take 88 mcg by mouth daily before breakfast.      . pantoprazole (PROTONIX) 40 MG tablet Take 40 mg by mouth 2 (two) times daily.       . raloxifene (EVISTA) 60 MG tablet Take 60 mg by mouth daily.        . rosuvastatin (CRESTOR) 10 MG tablet Take 10 mg by mouth at bedtime.      Marland Kitchen warfarin (COUMADIN) 2 MG tablet Take 2 mg by mouth See admin instructions. Take 1 tablet by mouth every day except Friday. Do not take any Warfarin  on Friday       No current facility-administered medications for this visit.    Physical Exam: Filed Vitals:   05/24/14 1020  BP: 131/76  Pulse: 80  Height: 5\' 4"  (1.626 m)  Weight: 154 lb (69.854 kg)    GEN- The patient is elderly appearing, alert and oriented x 3 today.   Head- normocephalic, atraumatic Eyes-  Sclera clear, conjunctiva pink Ears- hearing intact Oropharynx- clear Lungs- Clear to ausculation bilaterally, normal work of breathing Chest- pacemaker pocket is well healed Heart- Regular rate and rhythm, no murmurs, rubs or gallops, PMI not laterally displaced GI- soft, NT, ND, + BS Extremities- no clubbing, cyanosis, or edema  Pacemaker interrogation- reviewed in detail today,  See PACEART report  Assessment and Plan:  1. Sick sinus syndrome Normal pacemaker function See Pace Art report No changes today  2. afib  Well controlled with amiodarone She is presently on coumadin chads2vasc score is 5  Continue long term anticoagulation  3. HTN Stable No change required today   carelink every 3 months Return in 1 year

## 2014-05-24 NOTE — Patient Instructions (Signed)
Your physician recommends that you schedule a follow-up appointment in: 1 year. You will receive a reminder letter in the mail in about 10 months reminding you to call and schedule your appointment. If you don't receive this letter, please contact our office. Carelink/device check on 08/26/14 from home. Your physician recommends that you continue on your current medications as directed. Please refer to the Current Medication list given to you today.

## 2014-05-30 ENCOUNTER — Encounter: Payer: Self-pay | Admitting: Internal Medicine

## 2014-05-31 DIAGNOSIS — I4891 Unspecified atrial fibrillation: Secondary | ICD-10-CM | POA: Diagnosis not present

## 2014-06-13 DIAGNOSIS — I4891 Unspecified atrial fibrillation: Secondary | ICD-10-CM | POA: Diagnosis not present

## 2014-06-17 DIAGNOSIS — I4891 Unspecified atrial fibrillation: Secondary | ICD-10-CM | POA: Diagnosis not present

## 2014-06-20 ENCOUNTER — Encounter: Payer: Self-pay | Admitting: Cardiovascular Disease

## 2014-06-20 ENCOUNTER — Ambulatory Visit (INDEPENDENT_AMBULATORY_CARE_PROVIDER_SITE_OTHER): Payer: Medicare Other | Admitting: Cardiovascular Disease

## 2014-06-20 VITALS — BP 146/76 | HR 86 | Ht 63.0 in | Wt 154.8 lb

## 2014-06-20 DIAGNOSIS — I10 Essential (primary) hypertension: Secondary | ICD-10-CM

## 2014-06-20 DIAGNOSIS — I4891 Unspecified atrial fibrillation: Secondary | ICD-10-CM

## 2014-06-20 DIAGNOSIS — I480 Paroxysmal atrial fibrillation: Secondary | ICD-10-CM

## 2014-06-20 DIAGNOSIS — E785 Hyperlipidemia, unspecified: Secondary | ICD-10-CM

## 2014-06-20 DIAGNOSIS — Z7901 Long term (current) use of anticoagulants: Secondary | ICD-10-CM

## 2014-06-20 DIAGNOSIS — I495 Sick sinus syndrome: Secondary | ICD-10-CM

## 2014-06-20 DIAGNOSIS — Z79899 Other long term (current) drug therapy: Secondary | ICD-10-CM

## 2014-06-20 DIAGNOSIS — I2510 Atherosclerotic heart disease of native coronary artery without angina pectoris: Secondary | ICD-10-CM

## 2014-06-20 DIAGNOSIS — Z950 Presence of cardiac pacemaker: Secondary | ICD-10-CM

## 2014-06-20 NOTE — Progress Notes (Signed)
Patient ID: Melanie [name], female   DOB: 07-30-1926, 80 y.o.   MRN: [id number]      SUBJECTIVE: Melanie [name] has a history of medically managed CAD, HTN, paroxysmal atrial fibrillation, dyslipidemia, and sick sinus syndrome with a pacemaker. Her pacemaker was most recently evaluated on 05/24/2014 which showed that she was in atrial fibrillation 0.5% of the time. No high ventricular rates were detected.  She denies chest pain, shortness of breath, lightheadedness, dizziness, and syncope.  She had some back pain last week at her PCP's office and an ECG was reportedly performed, which is unavailable to me at the present time, but was told she had muscular back spasms. Her primary complaint is a runny nose and post-nasal drip, and plans to purchase loratadine.   Review of Systems: As per "subjective", otherwise negative.  Allergies  Allergen Reactions  . Ciprofloxacin Other (See Comments)    Unknown   . Hctz [Hydrochlorothiazide] Other (See Comments)    Unknown   . Lopid [Gemfibrozil] Other (See Comments)    Unknown      Current Outpatient Prescriptions  Medication Sig Dispense Refill  . amiodarone (PACERONE) 200 MG tablet TAKE 1/2 TABLET BY MOUTH DAILY  15 tablet  6  . amLODipine (NORVASC) 5 MG tablet Take 5 mg by mouth daily.      . clonazePAM (KLONOPIN) 0.5 MG tablet Take 0.5 mg by mouth at bedtime.       Marland Kitchen ezetimibe (ZETIA) 10 MG tablet Take 10 mg by mouth daily.      . isosorbide mononitrate (IMDUR) 30 MG 24 hr tablet Take 15 mg by mouth daily.      Marland Kitchen levothyroxine (SYNTHROID, LEVOTHROID) 88 MCG tablet Take 88 mcg by mouth daily before breakfast.      . pantoprazole (PROTONIX) 40 MG tablet Take 40 mg by mouth 2 (two) times daily.       . raloxifene (EVISTA) 60 MG tablet Take 60 mg by mouth daily.        . rosuvastatin (CRESTOR) 10 MG tablet Take 10 mg by mouth at bedtime.      Marland Kitchen warfarin (COUMADIN) 2 MG tablet Take 2 mg by mouth See admin instructions. Take 1 tablet by mouth every  day except Friday. Do not take any Warfarin on Friday       No current facility-administered medications for this visit.    Past Medical History  Diagnosis Date  . Vertigo   . Paroxysmal atrial fibrillation     on coumadin and amiodarone  . Sinus node dysfunction     s/p Medtronic dual-chamber pacemaker  . Hypercalcemia   . Hypertension   . Dyslipidemia   . CAD (coronary artery disease)     Two vessel, quiescent; 70% LAD aretery disease and well collateralized, 100% RCA by cardiac cath in 2004; NL LVF; low risk Cardioloite; EF 74%, 11/10  . Hypothyroidism     Past Surgical History  Procedure Laterality Date  . Pacemaker insertion  05/24/03    MDT Kappa  . Abdominal hysterectomy    . Fracture surgery      History   Social History  . Marital Status: Single    Spouse Name: N/A    Number of Children: N/A  . Years of Education: N/A   Occupational History  . Not on file.   Social History Main Topics  . Smoking status: Never Smoker   . Smokeless tobacco: Never Used  . Alcohol Use: No  . Drug Use: No  .  Sexual Activity: Not on file   Other Topics Concern  . Not on file   Social History Narrative  . No narrative on file     Filed Vitals:   06/20/14 1110  BP: 146/76  Pulse: 86  Height: 5\' 3"  (1.6 m)  Weight: 154 lb 12 oz (70.194 kg)  SpO2: 95%    PHYSICAL EXAM General: NAD HEENT: Normal. Neck: No JVD, no thyromegaly. Lungs: Clear to auscultation bilaterally with normal respiratory effort. CV: Nondisplaced PMI.  Regular rate and rhythm, normal S1/S2, no S3/S4, no murmur. No pretibial or periankle edema.  No carotid bruit.  Normal pedal pulses.  Abdomen: Soft, nontender, no hepatosplenomegaly, no distention.  Neurologic: Alert and oriented x 3.  Psych: Normal affect. Skin: Normal. Musculoskeletal: Normal range of motion, no gross deformities. Extremities: No clubbing or cyanosis.   ECG: Most recent ECG reviewed.      ASSESSMENT AND PLAN: 1. CAD:  Quiescent on current medical therapy, which includes Crestor, Imdur, and amlodipine.  2. Essential HTN: Reasonably controlled for age on amlodipine.  3. Hyperlipidemia: On Crestor. Lipids normal in 12/2013. 4. Paroxysmal atrial fibrillation: Maintained on amiodarone and warfarin, without difficulties. Normal thyroid function studies and LFT's in 12/2013. 5. Pacemaker: Interrogation results as noted above from 04/2014.  Dispo: f/u 6 months.  Kate Sable, M.D., F.A.C.C.

## 2014-06-20 NOTE — Patient Instructions (Signed)
There were no changes to your medications. Continue as directed. Your physician wants you to follow up in: 6 months.  You will receive a reminder letter in the mail one-two months in advance.  If you don't receive a letter, please call our office to schedule the follow up appointment.

## 2014-07-01 DIAGNOSIS — I4891 Unspecified atrial fibrillation: Secondary | ICD-10-CM | POA: Diagnosis not present

## 2014-07-08 DIAGNOSIS — J309 Allergic rhinitis, unspecified: Secondary | ICD-10-CM | POA: Diagnosis not present

## 2014-07-08 DIAGNOSIS — J019 Acute sinusitis, unspecified: Secondary | ICD-10-CM | POA: Diagnosis not present

## 2014-07-08 DIAGNOSIS — I4891 Unspecified atrial fibrillation: Secondary | ICD-10-CM | POA: Diagnosis not present

## 2014-07-13 DIAGNOSIS — R05 Cough: Secondary | ICD-10-CM | POA: Diagnosis not present

## 2014-07-19 ENCOUNTER — Emergency Department (HOSPITAL_COMMUNITY): Payer: Medicare Other

## 2014-07-19 ENCOUNTER — Encounter: Payer: Self-pay | Admitting: *Deleted

## 2014-07-19 ENCOUNTER — Encounter (HOSPITAL_COMMUNITY): Payer: Self-pay | Admitting: Emergency Medicine

## 2014-07-19 ENCOUNTER — Emergency Department (HOSPITAL_COMMUNITY)
Admission: EM | Admit: 2014-07-19 | Discharge: 2014-07-19 | Disposition: A | Discharge status: Home / Self Care | Payer: Medicare Other | Attending: Emergency Medicine | Admitting: Emergency Medicine

## 2014-07-19 DIAGNOSIS — R079 Chest pain, unspecified: Secondary | ICD-10-CM | POA: Insufficient documentation

## 2014-07-19 DIAGNOSIS — I2510 Atherosclerotic heart disease of native coronary artery without angina pectoris: Secondary | ICD-10-CM | POA: Insufficient documentation

## 2014-07-19 DIAGNOSIS — E039 Hypothyroidism, unspecified: Secondary | ICD-10-CM | POA: Insufficient documentation

## 2014-07-19 DIAGNOSIS — I480 Paroxysmal atrial fibrillation: Secondary | ICD-10-CM | POA: Insufficient documentation

## 2014-07-19 DIAGNOSIS — J984 Other disorders of lung: Secondary | ICD-10-CM | POA: Diagnosis not present

## 2014-07-19 DIAGNOSIS — Z950 Presence of cardiac pacemaker: Secondary | ICD-10-CM | POA: Diagnosis not present

## 2014-07-19 DIAGNOSIS — R05 Cough: Secondary | ICD-10-CM | POA: Insufficient documentation

## 2014-07-19 DIAGNOSIS — R6883 Chills (without fever): Secondary | ICD-10-CM | POA: Diagnosis not present

## 2014-07-19 DIAGNOSIS — I10 Essential (primary) hypertension: Secondary | ICD-10-CM | POA: Insufficient documentation

## 2014-07-19 DIAGNOSIS — E785 Hyperlipidemia, unspecified: Secondary | ICD-10-CM | POA: Insufficient documentation

## 2014-07-19 DIAGNOSIS — Z79899 Other long term (current) drug therapy: Secondary | ICD-10-CM | POA: Diagnosis not present

## 2014-07-19 DIAGNOSIS — Z792 Long term (current) use of antibiotics: Secondary | ICD-10-CM | POA: Diagnosis not present

## 2014-07-19 DIAGNOSIS — Z7901 Long term (current) use of anticoagulants: Secondary | ICD-10-CM | POA: Diagnosis not present

## 2014-07-19 LAB — CBC WITH DIFFERENTIAL/PLATELET
BASOS PCT: 0 % (ref 0–1)
Basophils Absolute: 0 10*3/uL (ref 0.0–0.1)
EOS PCT: 1 % (ref 0–5)
Eosinophils Absolute: 0.1 10*3/uL (ref 0.0–0.7)
HEMATOCRIT: 38.3 % (ref 36.0–46.0)
Hemoglobin: 12.8 g/dL (ref 12.0–15.0)
Lymphocytes Relative: 29 % (ref 12–46)
Lymphs Abs: 3.1 10*3/uL (ref 0.7–4.0)
MCH: 30.5 pg (ref 26.0–34.0)
MCHC: 33.4 g/dL (ref 30.0–36.0)
MCV: 91.2 fL (ref 78.0–100.0)
MONOS PCT: 7 % (ref 3–12)
Monocytes Absolute: 0.8 10*3/uL (ref 0.1–1.0)
NEUTROS PCT: 63 % (ref 43–77)
Neutro Abs: 6.8 10*3/uL (ref 1.7–7.7)
Platelets: 210 10*3/uL (ref 150–400)
RBC: 4.2 MIL/uL (ref 3.87–5.11)
RDW: 14.2 % (ref 11.5–15.5)
WBC: 10.9 10*3/uL — ABNORMAL HIGH (ref 4.0–10.5)

## 2014-07-19 LAB — BASIC METABOLIC PANEL
Anion gap: 12 (ref 5–15)
BUN: 26 mg/dL — ABNORMAL HIGH (ref 6–23)
CO2: 28 mEq/L (ref 19–32)
CREATININE: 1.22 mg/dL — AB (ref 0.50–1.10)
Calcium: 10.4 mg/dL (ref 8.4–10.5)
Chloride: 99 mEq/L (ref 96–112)
GFR calc Af Amer: 45 mL/min — ABNORMAL LOW (ref >90)
GFR, EST NON AFRICAN AMERICAN: 39 mL/min — AB (ref >90)
GLUCOSE: 117 mg/dL — AB (ref 70–99)
POTASSIUM: 4.5 meq/L (ref 3.7–5.3)
Sodium: 139 mEq/L (ref 137–147)

## 2014-07-19 LAB — TROPONIN I: Troponin I: <0.3 ng/mL (ref <0.30)

## 2014-07-19 MED ORDER — KETOROLAC TROMETHAMINE 30 MG/ML IJ SOLN
30.0000 mg | Freq: Once | INTRAMUSCULAR | Status: AC
  Administered 2014-07-19: 30 mg via INTRAVENOUS
  Filled 2014-07-19: qty 1

## 2014-07-19 MED ORDER — AZITHROMYCIN 250 MG PO TABS: ORAL_TABLET | ORAL | Status: DC

## 2014-07-19 MED ORDER — HYDROCODONE-ACETAMINOPHEN 5-325 MG PO TABS: 1.0000 | ORAL_TABLET | ORAL | Status: DC | PRN

## 2014-07-19 MED ORDER — DEXTROSE 5 % IV SOLN
500.0000 mg | Freq: Once | INTRAVENOUS | Status: AC
  Administered 2014-07-19: 500 mg via INTRAVENOUS
  Filled 2014-07-19: qty 500

## 2014-07-19 MED ORDER — KETOROLAC TROMETHAMINE 30 MG/ML IJ SOLN: 30.0000 mg | Freq: Once | INTRAMUSCULAR | Status: DC

## 2014-07-19 NOTE — ED Provider Notes (Signed)
CSN: [id number]     Arrival date & time 07/19/14  1747 History  This chart was scribed for Nat Christen, MD by Molli Posey, ED Scribe. This patient was seen in room [facility]04/[facility]04 and the patient's care was started 6:16 PM.    Chief Complaint  Patient presents with  . Chest Pain    The history is provided by the patient. No language interpreter was used.   HPI Comments: Melanie [name] is a 80 y.o. female who presents to the Emergency Department complaining of intermittent left lateral chest pain that started this morning when she woke up. She describes the pain as a soreness and says the pain will last for about 10 minutes before it subsides, and says she's had about 5 episodes today. Pt states she feels bloated in her stomach and reports a cough for the past 2 weeks. She reports that she had been walking earlier today. She says she has associated chills as a symptom. She states she is in no pain currently. She denies SOB and diaphoresis as symptoms.    Past Medical History  Diagnosis Date  . Vertigo   . Paroxysmal atrial fibrillation     on coumadin and amiodarone  . Sinus node dysfunction     s/p Medtronic dual-chamber pacemaker  . Hypercalcemia   . Hypertension   . Dyslipidemia   . CAD (coronary artery disease)     Two vessel, quiescent; 70% LAD aretery disease and well collateralized, 100% RCA by cardiac cath in 2004; NL LVF; low risk Cardioloite; EF 74%, 11/10  . Hypothyroidism    Past Surgical History  Procedure Laterality Date  . Pacemaker insertion  05/24/03    MDT Kappa  . Abdominal hysterectomy    . Fracture surgery     Family History  Problem Relation Age of Onset  . Hypertension Neg Hx   . Diabetes Neg Hx   . Coronary artery disease Neg Hx    History  Substance Use Topics  . Smoking status: Never Smoker   . Smokeless tobacco: Never Used  . Alcohol Use: No   OB History   Grav Para Term Preterm Abortions TAB SAB Ect Mult Living                 Review of  Systems  Constitutional: Positive for chills.  Respiratory: Positive for cough. Negative for shortness of breath.   Cardiovascular: Positive for chest pain.  All other systems reviewed and are negative.     Allergies  Ciprofloxacin; Hctz; and Lopid  Home Medications   Prior to Admission medications   Medication Sig Start Date End Date Taking? Authorizing Provider  amiodarone (PACERONE) 200 MG tablet Take 100 mg by mouth every morning.   Yes Historical Provider, MD  amLODipine (NORVASC) 5 MG tablet Take 5 mg by mouth every morning.    Yes Historical Provider, MD  cephALEXin (KEFLEX) 500 MG capsule Take 500 mg by mouth 3 (three) times daily.   Yes Historical Provider, MD  clonazePAM (KLONOPIN) 0.5 MG tablet Take 0.5 mg by mouth at bedtime. For restless legg syndrome   Yes Historical Provider, MD  ezetimibe (ZETIA) 10 MG tablet Take 10 mg by mouth at bedtime.    Yes Historical Provider, MD  isosorbide mononitrate (IMDUR) 30 MG 24 hr tablet Take 15 mg by mouth every morning.    Yes Historical Provider, MD  levothyroxine (SYNTHROID, LEVOTHROID) 88 MCG tablet Take 88 mcg by mouth daily before breakfast.   Yes Historical  Provider, MD  loratadine (CLARITIN) 10 MG tablet Take 10 mg by mouth daily.   Yes Historical Provider, MD  pantoprazole (PROTONIX) 40 MG tablet Take 40 mg by mouth every morning.    Yes Historical Provider, MD  raloxifene (EVISTA) 60 MG tablet Take 60 mg by mouth every morning.    Yes Historical Provider, MD  rosuvastatin (CRESTOR) 10 MG tablet Take 10 mg by mouth at bedtime.   Yes Historical Provider, MD  warfarin (COUMADIN) 2 MG tablet Take 2 mg by mouth See admin instructions. Take 1 tablet by mouth every day except Tuesdays. Do not take any Warfarin on Tuesdays   Yes Historical Provider, MD  azithromycin (ZITHROMAX) 250 MG tablet 1 tablet daily for 4 days starting Saturday evening 07/19/14   Nat Christen, MD  guaiFENesin-codeine [facility]) 100-10 MG/5ML syrup Take 5 mLs  by mouth 3 (three) times daily as needed for cough.    Historical Provider, MD  HYDROcodone-acetaminophen (NORCO) 5-325 MG per tablet Take 1 tablet by mouth every 4 (four) hours as needed. 07/19/14   Nat Christen, MD  PREDNISONE PO Take 2 tablets by mouth daily.    Historical Provider, MD   BP 153/66  Pulse 74  Temp(Src) 98.9 F (37.2 C) (Oral)  Resp 11  Ht 5\' 3"  (1.6 m)  Wt 154 lb (69.854 kg)  BMI 27.29 kg/m2  SpO2 96% Physical Exam  Nursing note and vitals reviewed. Constitutional: She is oriented to person, place, and time. She appears well-developed and well-nourished.  HENT:  Head: Normocephalic and atraumatic.  Eyes: Conjunctivae and EOM are normal. Pupils are equal, round, and reactive to light.  Neck: Normal range of motion. Neck supple.  Cardiovascular: Normal rate, regular rhythm and normal heart sounds.   Pulmonary/Chest: Effort normal and breath sounds normal.  Abdominal: Soft. Bowel sounds are normal.  Musculoskeletal: Normal range of motion.  Tender to palpation in left lateral chest wall   Neurological: She is alert and oriented to person, place, and time.  Skin: Skin is warm and dry.  Psychiatric: She has a normal mood and affect. Her behavior is normal.    ED Course  Procedures  DIAGNOSTIC STUDIES: Oxygen Saturation is 95% on RA, normal by my interpretation.    COORDINATION OF CARE: 6:22 PM Discussed treatment plan with pt at bedside and pt agreed to plan. Will order CXR.    Labs Review Labs Reviewed  CBC WITH DIFFERENTIAL - Abnormal; Notable for the following:    WBC 10.9 (*)    All other components within normal limits  BASIC METABOLIC PANEL - Abnormal; Notable for the following:    Glucose, Bld 117 (*)    BUN 26 (*)    Creatinine, Ser 1.22 (*)    GFR calc non Af Amer 39 (*)    GFR calc Af Amer 45 (*)    All other components within normal limits  TROPONIN I    Imaging Review Dg Chest 2 View  07/19/2014   CLINICAL DATA:  80 year old female with  history of left-sided chest pain and cough.  EXAM: CHEST  2 VIEW  COMPARISON:  Chest x-ray 02/18/2013.  FINDINGS: Lung volumes are normal. Patchy ill-defined opacities are noted at the left lung base. Lungs are otherwise clear. No pleural effusions. No evidence of pulmonary edema. Heart size is upper limits of normal. The patient is rotated to the right on today's exam, resulting in distortion of the mediastinal contours and reduced diagnostic sensitivity and specificity for mediastinal pathology.  Atherosclerosis in the thoracic aorta. Left-sided pacemaker device in position with lead tips projecting over the expected location of the right atrium and right ventricular apex.  IMPRESSION: 1. Patchy ill-defined opacities in the left lung base may reflect early changes of bronchopneumonia or sequela of recent aspiration. 2. Borderline cardiomegaly. 3. Atherosclerosis.   Electronically Signed   By: Vinnie Langton M.D.   On: 07/19/2014 19:06     EKG Interpretation   Date/Time:  Friday July 19 2014 17:49:55 EDT Ventricular Rate:  74 PR Interval:  228 QRS Duration: 88 QT Interval:  392 QTC Calculation: 435 R Axis:   -34 Text Interpretation:  Atrial-paced rhythm with prolonged AV conduction  Left axis deviation Abnormal ECG Confirmed by Laityn Bensen  MD, Josue Falconi ([id number]) on  07/19/2014 6:34:12 PM      MDM   Final diagnoses:  Chest pain   Patient is in no acute distress. Vital signs normal. She is tender to palpation in the left lateral chest wall. Chest x-ray shows a possible patchy density in the left lung base. Will start antibiotic. EKG and troponin negative. Also Rx for Norco.  I personally performed the services described in this documentation, which was scribed in my presence. The recorded information has been reviewed and is accurate.      Nat Christen, MD 07/19/14 2206

## 2014-07-19 NOTE — ED Notes (Signed)
Pt ambulated to bathroom with standby assist with no difficulty. Nad noted at present. Pt c/o mild chest pain with movement but states when she is still there is no pain.

## 2014-07-19 NOTE — ED Notes (Signed)
Pt co left sided chest pain and cough x1 day. Pt states her chest started hurting after a "coughing spell", also states "I feel bloated in my stomach". Pt has had cough and URI x2 weeks.

## 2014-07-19 NOTE — Progress Notes (Signed)
Patient woke up this morning with c/o intermittent pain on her left side of breast. Patient rated pain on a scale a 7 of 1-10 (10 being the greatest). Patient c/o coughing for 2 weeks. Patient hasn't eaten anything today because of the pain. Patient denies sob, dizziness or chest pain. No c/o chills, n/v. Patient has been burping a lot. Patient said yesterday she felt bloated. Patient said she did take tums and it made the pain feel better. Patient advised to go to the ED for an evaluation.

## 2014-07-19 NOTE — Discharge Instructions (Signed)
Chest x-ray shows a small amount of pneumonia in your left lung. Start antibiotic pill tomorrow evening. Also prescription for pain. Return here worse in any way or follow up with your primary care doctor next week

## 2014-07-19 NOTE — ED Notes (Signed)
MD at bedside. 

## 2014-07-22 DIAGNOSIS — I4891 Unspecified atrial fibrillation: Secondary | ICD-10-CM | POA: Diagnosis not present

## 2014-07-29 DIAGNOSIS — I482 Chronic atrial fibrillation: Secondary | ICD-10-CM | POA: Diagnosis not present

## 2014-08-08 DIAGNOSIS — B373 Candidiasis of vulva and vagina: Secondary | ICD-10-CM | POA: Diagnosis not present

## 2014-08-08 DIAGNOSIS — R8299 Other abnormal findings in urine: Secondary | ICD-10-CM | POA: Diagnosis not present

## 2014-08-10 ENCOUNTER — Emergency Department (HOSPITAL_COMMUNITY): Payer: Medicare Other

## 2014-08-10 ENCOUNTER — Encounter (HOSPITAL_COMMUNITY): Payer: Self-pay

## 2014-08-10 ENCOUNTER — Emergency Department (HOSPITAL_COMMUNITY)
Admission: EM | Admit: 2014-08-10 | Discharge: 2014-08-10 | Disposition: A | Discharge status: Home / Self Care | Payer: Medicare Other | Attending: Emergency Medicine | Admitting: Emergency Medicine

## 2014-08-10 DIAGNOSIS — E039 Hypothyroidism, unspecified: Secondary | ICD-10-CM | POA: Diagnosis not present

## 2014-08-10 DIAGNOSIS — Z7901 Long term (current) use of anticoagulants: Secondary | ICD-10-CM | POA: Diagnosis not present

## 2014-08-10 DIAGNOSIS — R109 Unspecified abdominal pain: Secondary | ICD-10-CM | POA: Diagnosis present

## 2014-08-10 DIAGNOSIS — I10 Essential (primary) hypertension: Secondary | ICD-10-CM | POA: Diagnosis not present

## 2014-08-10 DIAGNOSIS — E785 Hyperlipidemia, unspecified: Secondary | ICD-10-CM | POA: Diagnosis not present

## 2014-08-10 DIAGNOSIS — Z8619 Personal history of other infectious and parasitic diseases: Secondary | ICD-10-CM | POA: Insufficient documentation

## 2014-08-10 DIAGNOSIS — Z792 Long term (current) use of antibiotics: Secondary | ICD-10-CM | POA: Diagnosis not present

## 2014-08-10 DIAGNOSIS — I480 Paroxysmal atrial fibrillation: Secondary | ICD-10-CM | POA: Diagnosis not present

## 2014-08-10 DIAGNOSIS — Z79899 Other long term (current) drug therapy: Secondary | ICD-10-CM | POA: Diagnosis not present

## 2014-08-10 DIAGNOSIS — K5900 Constipation, unspecified: Secondary | ICD-10-CM | POA: Diagnosis not present

## 2014-08-10 DIAGNOSIS — Z90710 Acquired absence of both cervix and uterus: Secondary | ICD-10-CM | POA: Insufficient documentation

## 2014-08-10 DIAGNOSIS — I2510 Atherosclerotic heart disease of native coronary artery without angina pectoris: Secondary | ICD-10-CM | POA: Insufficient documentation

## 2014-08-10 DIAGNOSIS — K7689 Other specified diseases of liver: Secondary | ICD-10-CM | POA: Diagnosis not present

## 2014-08-10 DIAGNOSIS — Z950 Presence of cardiac pacemaker: Secondary | ICD-10-CM | POA: Insufficient documentation

## 2014-08-10 LAB — URINALYSIS, ROUTINE W REFLEX MICROSCOPIC
GLUCOSE, UA: NEGATIVE mg/dL
Hgb urine dipstick: NEGATIVE
Ketones, ur: NEGATIVE mg/dL
Leukocytes, UA: NEGATIVE
Nitrite: NEGATIVE
PROTEIN: NEGATIVE mg/dL
UROBILINOGEN UA: 0.2 mg/dL (ref 0.0–1.0)
pH: 5.5 (ref 5.0–8.0)

## 2014-08-10 LAB — PROTIME-INR
INR: 2.9 — AB (ref 0.00–1.49)
PROTHROMBIN TIME: 30.5 s — AB (ref 11.6–15.2)

## 2014-08-10 LAB — COMPREHENSIVE METABOLIC PANEL
ALT: 18 U/L (ref 0–35)
ANION GAP: 12 (ref 5–15)
AST: 24 U/L (ref 0–37)
Albumin: 3.2 g/dL — ABNORMAL LOW (ref 3.5–5.2)
Alkaline Phosphatase: 89 U/L (ref 39–117)
BUN: 15 mg/dL (ref 6–23)
CO2: 24 meq/L (ref 19–32)
CREATININE: 1.06 mg/dL (ref 0.50–1.10)
Calcium: 9.7 mg/dL (ref 8.4–10.5)
Chloride: 104 mEq/L (ref 96–112)
GFR, EST AFRICAN AMERICAN: 54 mL/min — AB (ref >90)
GFR, EST NON AFRICAN AMERICAN: 46 mL/min — AB (ref >90)
GLUCOSE: 124 mg/dL — AB (ref 70–99)
Potassium: 4 mEq/L (ref 3.7–5.3)
Sodium: 140 mEq/L (ref 137–147)
TOTAL PROTEIN: 6.8 g/dL (ref 6.0–8.3)
Total Bilirubin: 0.2 mg/dL — ABNORMAL LOW (ref 0.3–1.2)

## 2014-08-10 LAB — CBC WITH DIFFERENTIAL/PLATELET
Basophils Absolute: 0 10*3/uL (ref 0.0–0.1)
Basophils Relative: 0 % (ref 0–1)
EOS PCT: 3 % (ref 0–5)
Eosinophils Absolute: 0.2 10*3/uL (ref 0.0–0.7)
HEMATOCRIT: 35.9 % — AB (ref 36.0–46.0)
Hemoglobin: 11.8 g/dL — ABNORMAL LOW (ref 12.0–15.0)
Lymphocytes Relative: 33 % (ref 12–46)
Lymphs Abs: 2 10*3/uL (ref 0.7–4.0)
MCH: 30.1 pg (ref 26.0–34.0)
MCHC: 32.9 g/dL (ref 30.0–36.0)
MCV: 91.6 fL (ref 78.0–100.0)
Monocytes Absolute: 0.8 10*3/uL (ref 0.1–1.0)
Monocytes Relative: 13 % — ABNORMAL HIGH (ref 3–12)
Neutro Abs: 3.1 10*3/uL (ref 1.7–7.7)
Neutrophils Relative %: 51 % (ref 43–77)
PLATELETS: 162 10*3/uL (ref 150–400)
RBC: 3.92 MIL/uL (ref 3.87–5.11)
RDW: 14.3 % (ref 11.5–15.5)
WBC: 6.1 10*3/uL (ref 4.0–10.5)

## 2014-08-10 MED ORDER — IOHEXOL 300 MG/ML  SOLN
100.0000 mL | Freq: Once | INTRAMUSCULAR | Status: AC | PRN
  Administered 2014-08-10: 100 mL via INTRAVENOUS

## 2014-08-10 MED ORDER — IOHEXOL 300 MG/ML  SOLN
50.0000 mL | Freq: Once | INTRAMUSCULAR | Status: AC | PRN
  Administered 2014-08-10: 50 mL via ORAL

## 2014-08-10 MED ORDER — DOCUSATE SODIUM 100 MG PO CAPS: 100.0000 mg | ORAL_CAPSULE | Freq: Two times a day (BID) | ORAL | Status: DC

## 2014-08-10 MED ORDER — MORPHINE SULFATE 2 MG/ML IJ SOLN
2.0000 mg | Freq: Once | INTRAMUSCULAR | Status: AC
  Administered 2014-08-10: 2 mg via INTRAVENOUS
  Filled 2014-08-10: qty 1

## 2014-08-10 NOTE — ED Notes (Signed)
Patient c/o right suprapubic pain which radiates to right lower back. States she seen physician Thursday who told her she had an yeast infections was prescribed a medication of which she has took three out of four doses.Patients states symptoms are worsening. Denies Hx of kidney stones, denies blood in urine.

## 2014-08-10 NOTE — ED Provider Notes (Signed)
CSN: [id number]     Arrival date & time 08/10/14  1531 History   This chart was scribed for Melanie Dakin, MD by Forrestine Him, ED Scribe. This patient was seen in room APA14/APA14 and the patient's care was started at 4:02 PM.    Chief Complaint  Patient presents with  . Flank Pain   The history is provided by the patient. No language interpreter was used.     HPI Comments: Dennisha Mouser is a 80 y.o. female with a PMHx of A-Fib, HTN, dyslipdemia, hypothyroidism, and CAD who presents to the Emergency Department complaining of intermittent, moderate R sided flank pain x 1 month that has progressively worsened in last 24 hours. Pain radiates to right lower quadrant. Pain is exacerbated with ambulation and bending, improved with remaining still. No other associated symptoms . She has tried OTC Tylenol without any improvement for symptoms. At this time she denies any fever, chills, or dysuria. Pt was able to urinate without difficulty today. Last normal bowel movement yesterday, normal. Pt was diagnosed with a yeast infection 3 days ago per [facility]. Ms. Viall is currently on Warfarin daily. She denies any tobacco use or alcohol consumption. Pt with known allergy to ciprofloxacin, HCTZ, and Lopid.   Pt is followed by: Jeri Modena at [facility]  Past Medical History  Diagnosis Date  . Vertigo   . Paroxysmal atrial fibrillation     on coumadin and amiodarone  . Sinus node dysfunction     s/p Medtronic dual-chamber pacemaker  . Hypercalcemia   . Hypertension   . Dyslipidemia   . CAD (coronary artery disease)     Two vessel, quiescent; 70% LAD aretery disease and well collateralized, 100% RCA by cardiac cath in 2004; NL LVF; low risk Cardioloite; EF 74%, 11/10  . Hypothyroidism    Past Surgical History  Procedure Laterality Date  . Pacemaker insertion  05/24/03    MDT Kappa  . Abdominal hysterectomy    . Fracture surgery     Family History  Problem Relation Age of Onset   . Hypertension Neg Hx   . Diabetes Neg Hx   . Coronary artery disease Neg Hx    History  Substance Use Topics  . Smoking status: Never Smoker   . Smokeless tobacco: Never Used  . Alcohol Use: No   OB History    No data available     Review of Systems  Constitutional: Negative.  Negative for fever and chills.  HENT: Negative.   Respiratory: Negative.   Cardiovascular: Negative.   Gastrointestinal: Negative.   Genitourinary: Positive for flank pain. Negative for dysuria.  Musculoskeletal: Positive for back pain.  Skin: Negative.   Neurological: Negative.   Psychiatric/Behavioral: Negative.   All other systems reviewed and are negative.     Allergies  Ciprofloxacin; Hctz; and Lopid  Home Medications   Prior to Admission medications   Medication Sig Start Date End Date Taking? Authorizing Provider  amiodarone (PACERONE) 200 MG tablet Take 100 mg by mouth every morning.    Historical Provider, MD  amLODipine (NORVASC) 5 MG tablet Take 5 mg by mouth every morning.     Historical Provider, MD  azithromycin (ZITHROMAX) 250 MG tablet 1 tablet daily for 4 days starting Saturday evening 07/19/14   Nat Christen, MD  cephALEXin (KEFLEX) 500 MG capsule Take 500 mg by mouth 3 (three) times daily.    Historical Provider, MD  clonazePAM (KLONOPIN) 0.5 MG tablet Take 0.5 mg by mouth  at bedtime. For restless legg syndrome    Historical Provider, MD  ezetimibe (ZETIA) 10 MG tablet Take 10 mg by mouth at bedtime.     Historical Provider, MD  guaiFENesin-codeine (CHERATUSSIN AC) 100-10 MG/5ML syrup Take 5 mLs by mouth 3 (three) times daily as needed for cough.    Historical Provider, MD  HYDROcodone-acetaminophen (NORCO) 5-325 MG per tablet Take 1 tablet by mouth every 4 (four) hours as needed. 07/19/14   Nat Christen, MD  isosorbide mononitrate (IMDUR) 30 MG 24 hr tablet Take 15 mg by mouth every morning.     Historical Provider, MD  levothyroxine (SYNTHROID, LEVOTHROID) 88 MCG tablet Take 88  mcg by mouth daily before breakfast.    Historical Provider, MD  loratadine (CLARITIN) 10 MG tablet Take 10 mg by mouth daily.    Historical Provider, MD  pantoprazole (PROTONIX) 40 MG tablet Take 40 mg by mouth every morning.     Historical Provider, MD  PREDNISONE PO Take 2 tablets by mouth daily.    Historical Provider, MD  raloxifene (EVISTA) 60 MG tablet Take 60 mg by mouth every morning.     Historical Provider, MD  rosuvastatin (CRESTOR) 10 MG tablet Take 10 mg by mouth at bedtime.    Historical Provider, MD  warfarin (COUMADIN) 2 MG tablet Take 2 mg by mouth See admin instructions. Take 1 tablet by mouth every day except Tuesdays. Do not take any Warfarin on Tuesdays    Historical Provider, MD   Triage Vitals: BP 132/61 mmHg  Pulse 77  Temp(Src) 97.6 F (36.4 C) (Oral)  Resp 18  Ht 5\' 3"  (1.6 m)  Wt 154 lb (69.854 kg)  BMI 27.29 kg/m2  SpO2 93%   Physical Exam  Constitutional: She appears well-developed and well-nourished.  HENT:  Head: Normocephalic and atraumatic.  Eyes: Conjunctivae are normal. Pupils are equal, round, and reactive to light.  Neck: Neck supple. No tracheal deviation present. No thyromegaly present.  Cardiovascular: Normal rate and regular rhythm.   No murmur heard. Pulmonary/Chest: Effort normal and breath sounds normal.  Abdominal: Soft. Bowel sounds are normal. She exhibits no distension. There is tenderness.  Tender right lower quadrant  Genitourinary:  Tender at right flank  Musculoskeletal: Normal range of motion. She exhibits no edema or tenderness.  Neurological: She is alert. No cranial nerve deficit. Coordination normal.  Skin: Skin is warm and dry. No rash noted.  Psychiatric: She has a normal mood and affect.  Nursing note and vitals reviewed.   ED Course  Procedures (including critical care time)  DIAGNOSTIC STUDIES: Oxygen Saturation is 93% on RA, adequate by my interpretation.    COORDINATION OF CARE: 4:16 PM- Will order CT  abdomen pelcis with contrast, protime-INR, urinalysis, CBC, and CMP. Will give omnipaque and morphine injection. Discussed treatment plan with pt at bedside and pt agreed to plan.    Labs Review Labs Reviewed - No data to display  Imaging Review No results found.   EKG Interpretation None     6:10 PM pain under control after treatment with intravenous morphine. 7:35 PM patient resting comfortably. Feels ready to go home. Results for orders placed or performed during the hospital encounter of 08/10/14  Urinalysis, Routine w reflex microscopic  Result Value Ref Range   Color, Urine YELLOW YELLOW   APPearance HAZY (A) CLEAR   Specific Gravity, Urine >1.030 (H) 1.005 - 1.030   pH 5.5 5.0 - 8.0   Glucose, UA NEGATIVE NEGATIVE mg/dL   Hgb  urine dipstick NEGATIVE NEGATIVE   Bilirubin Urine SMALL (A) NEGATIVE   Ketones, ur NEGATIVE NEGATIVE mg/dL   Protein, ur NEGATIVE NEGATIVE mg/dL   Urobilinogen, UA 0.2 0.0 - 1.0 mg/dL   Nitrite NEGATIVE NEGATIVE   Leukocytes, UA NEGATIVE NEGATIVE  CBC with Differential  Result Value Ref Range   WBC 6.1 4.0 - 10.5 K/uL   RBC 3.92 3.87 - 5.11 MIL/uL   Hemoglobin 11.8 (L) 12.0 - 15.0 g/dL   HCT 35.9 (L) 36.0 - 46.0 %   MCV 91.6 78.0 - 100.0 fL   MCH 30.1 26.0 - 34.0 pg   MCHC 32.9 30.0 - 36.0 g/dL   RDW 14.3 11.5 - 15.5 %   Platelets 162 150 - 400 K/uL   Neutrophils Relative % 51 43 - 77 %   Neutro Abs 3.1 1.7 - 7.7 K/uL   Lymphocytes Relative 33 12 - 46 %   Lymphs Abs 2.0 0.7 - 4.0 K/uL   Monocytes Relative 13 (H) 3 - 12 %   Monocytes Absolute 0.8 0.1 - 1.0 K/uL   Eosinophils Relative 3 0 - 5 %   Eosinophils Absolute 0.2 0.0 - 0.7 K/uL   Basophils Relative 0 0 - 1 %   Basophils Absolute 0.0 0.0 - 0.1 K/uL  Comprehensive metabolic panel  Result Value Ref Range   Sodium 140 137 - 147 mEq/L   Potassium 4.0 3.7 - 5.3 mEq/L   Chloride 104 96 - 112 mEq/L   CO2 24 19 - 32 mEq/L   Glucose, Bld 124 (H) 70 - 99 mg/dL   BUN 15 6 - 23 mg/dL    Creatinine, Ser 1.06 0.50 - 1.10 mg/dL   Calcium 9.7 8.4 - 10.5 mg/dL   Total Protein 6.8 6.0 - 8.3 g/dL   Albumin 3.2 (L) 3.5 - 5.2 g/dL   AST 24 0 - 37 U/L   ALT 18 0 - 35 U/L   Alkaline Phosphatase 89 39 - 117 U/L   Total Bilirubin 0.2 (L) 0.3 - 1.2 mg/dL   GFR calc non Af Amer 46 (L) >90 mL/min   GFR calc Af Amer 54 (L) >90 mL/min   Anion gap 12 5 - 15  Protime-INR  Result Value Ref Range   Prothrombin Time 30.5 (H) 11.6 - 15.2 seconds   INR 2.90 (H) 0.00 - 1.49   Dg Chest 2 View  07/19/2014   CLINICAL DATA:  80 year old female with history of left-sided chest pain and cough.  EXAM: CHEST  2 VIEW  COMPARISON:  Chest x-ray 02/18/2013.  FINDINGS: Lung volumes are normal. Patchy ill-defined opacities are noted at the left lung base. Lungs are otherwise clear. No pleural effusions. No evidence of pulmonary edema. Heart size is upper limits of normal. The patient is rotated to the right on today's exam, resulting in distortion of the mediastinal contours and reduced diagnostic sensitivity and specificity for mediastinal pathology. Atherosclerosis in the thoracic aorta. Left-sided pacemaker device in position with lead tips projecting over the expected location of the right atrium and right ventricular apex.  IMPRESSION: 1. Patchy ill-defined opacities in the left lung base may reflect early changes of bronchopneumonia or sequela of recent aspiration. 2. Borderline cardiomegaly. 3. Atherosclerosis.   Electronically Signed   By: Vinnie Langton M.D.   On: 07/19/2014 19:06   Ct Abdomen Pelvis W Contrast  08/10/2014   CLINICAL DATA:  Flank pain.  Right-sided.  EXAM: CT ABDOMEN AND PELVIS WITH CONTRAST  TECHNIQUE: Multidetector CT imaging  of the abdomen and pelvis was performed using the standard protocol following bolus administration of intravenous contrast.  CONTRAST:  74mL OMNIPAQUE IOHEXOL 300 MG/ML SOLN, 188mL OMNIPAQUE IOHEXOL 300  MG/ML  SOLN  COMPARISON:  None.  FINDINGS: Pacer leads are  seen in the right atrium and right ventricle. Mild cardiomegaly. 5 mm nodular density in the lingula on image number 12 appears stable compared CT abdomen pelvis of July 2011. There is mild prominence of interstitial lung markings peripherally at the lung bases.  Mild hepatic steatosis.  Two benign-appearing hepatic cysts. No suspicious hepatic lesion. Calcified granuloma in the right hepatic lobe is stable. Low-density lesion inferior aspect of the spleen is without significant change compared a noncontrast CT of 2011 and has benign appearances. The spleen is normal in size. Normal of the gallbladder, pancreas, and adrenal glands.  There is cortical thinning of both kidneys. Small exophytic low-density lesion arising from the medial upper pole the right kidney is stable in size compared to prior CT of 2011 consistent with benign cyst. Stable small exophytic cortical lesion in the lower pole of the left kidney, also likely a cyst. Negative for hydronephrosis.  Stomach is unremarkable.  There is a large amount of stool throughout the colon raising the possibility of constipation. No evidence of bowel obstruction. Rectum unremarkable. There is uncomplicated diverticulosis of the colon. The appendix is normal. Small bowel loops are normal in caliber and wall thickness. Normal urinary bladder. Hysterectomy. No adnexal mass. Negative for lymphadenopathy or ascites.  Heavy atherosclerotic calcification of the abdominal aorta without aneurysm.  No acute bony abnormality. Postoperative changes of the proximal right femur with a dynamic hip screw present.  IMPRESSION: 1. Large amount of stool in the colon. Question if constipation could be the cause of the patient's pain. 2. No evidence of urinary tract obstruction. 3. Stable benign findings as described above. 4. Mild cardiomegaly with pacer leads present.   Electronically Signed   By: Curlene Dolphin M.D.   On: 08/10/2014 18:44    MDM  I've gone over CT results with  patient and have explained stable lesions in narrative Plan prescription Colace, milk of magnesia.Tylenol for pain. Follow-up with primary care physician at [facility] if not better in a week Final diagnoses:  None  Dx #1 right flank pain #2 constipation #3 anemia        Melanie Dakin, MD 08/10/14 [phone]

## 2014-08-10 NOTE — Discharge Instructions (Signed)
Constipation Take milk of Magnesia as directed for constipation. Use the stool softener as prescribed. It is okay to take Tylenol as directed for pain. See your doctor at [facility] if not better in a week Constipation is when a person:  Poops (has a bowel movement) less than 3 times a week.  Has a hard time pooping.  Has poop that is dry, hard, or bigger than normal. HOME CARE   Eat foods with a lot of fiber in them. This includes fruits, vegetables, beans, and whole grains such as brown rice.  Avoid fatty foods and foods with a lot of sugar. This includes french fries, hamburgers, cookies, candy, and soda.  If you are not getting enough fiber from food, take products with added fiber in them (supplements).  Drink enough fluid to keep your pee (urine) clear or pale yellow.  Exercise on a regular basis, or as told by your doctor.  Go to the restroom when you feel like you need to poop. Do not hold it.  Only take medicine as told by your doctor. Do not take medicines that help you poop (laxatives) without talking to your doctor first. GET HELP RIGHT AWAY IF:   You have bright red blood in your poop (stool).  Your constipation lasts more than 4 days or gets worse.  You have belly (abdominal) or butt (rectal) pain.  You have thin poop (as thin as a pencil).  You lose weight, and it cannot be explained. MAKE SURE YOU:   Understand these instructions.  Will watch your condition.  Will get help right away if you are not doing well or get worse. Document Released: 03/01/2008 Document Revised: 09/18/2013 Document Reviewed: 06/25/2013 [facility] Patient Information 2015 [place], [place]. This information is not intended to replace advice given to you by your health care provider. Make sure you discuss any questions you have with your health care provider.

## 2014-08-20 DIAGNOSIS — S0990XA Unspecified injury of head, initial encounter: Secondary | ICD-10-CM | POA: Diagnosis not present

## 2014-08-26 ENCOUNTER — Ambulatory Visit (INDEPENDENT_AMBULATORY_CARE_PROVIDER_SITE_OTHER): Payer: Medicare Other | Admitting: *Deleted

## 2014-08-26 ENCOUNTER — Telehealth: Payer: Self-pay | Admitting: Cardiology

## 2014-08-26 DIAGNOSIS — I495 Sick sinus syndrome: Secondary | ICD-10-CM

## 2014-08-26 LAB — MDC_IDC_ENUM_SESS_TYPE_REMOTE
Battery Impedance: 112 Ohm
Brady Statistic AP VS Percent: 71 %
Brady Statistic AS VP Percent: 0 %
Brady Statistic AS VS Percent: 28 %
Date Time Interrogation Session: 20151130185449
Lead Channel Impedance Value: 455 Ohm
Lead Channel Impedance Value: 906 Ohm
Lead Channel Pacing Threshold Amplitude: 0.75 V
Lead Channel Pacing Threshold Pulse Width: 0.4 ms
Lead Channel Pacing Threshold Pulse Width: 0.4 ms
Lead Channel Sensing Intrinsic Amplitude: 8 mV
Lead Channel Setting Sensing Sensitivity: 2.8 mV
MDC IDC MSMT BATTERY REMAINING LONGEVITY: 145 mo
MDC IDC MSMT BATTERY VOLTAGE: 2.79 V
MDC IDC MSMT LEADCHNL RA PACING THRESHOLD AMPLITUDE: 0.5 V
MDC IDC MSMT LEADCHNL RA SENSING INTR AMPL: 1.4 mV
MDC IDC SET LEADCHNL RA PACING AMPLITUDE: 2 V
MDC IDC SET LEADCHNL RV PACING AMPLITUDE: 2.5 V
MDC IDC SET LEADCHNL RV PACING PULSEWIDTH: 0.4 ms
MDC IDC STAT BRADY AP VP PERCENT: 1 %

## 2014-08-26 NOTE — Telephone Encounter (Signed)
Spoke with pt and reminded pt of remote transmission that is due today. Pt verbalized understanding.   

## 2014-08-27 NOTE — Progress Notes (Signed)
Remote pacemaker transmission.   

## 2014-08-28 DIAGNOSIS — I482 Chronic atrial fibrillation: Secondary | ICD-10-CM | POA: Diagnosis not present

## 2014-09-03 ENCOUNTER — Encounter: Payer: Self-pay | Admitting: Cardiology

## 2014-09-05 ENCOUNTER — Encounter (HOSPITAL_COMMUNITY): Payer: Self-pay | Admitting: Internal Medicine

## 2014-09-11 ENCOUNTER — Encounter: Payer: Self-pay | Admitting: Internal Medicine

## 2014-09-11 DIAGNOSIS — R609 Edema, unspecified: Secondary | ICD-10-CM | POA: Diagnosis not present

## 2014-09-16 DIAGNOSIS — I482 Chronic atrial fibrillation: Secondary | ICD-10-CM | POA: Diagnosis not present

## 2014-09-16 DIAGNOSIS — M25531 Pain in right wrist: Secondary | ICD-10-CM | POA: Diagnosis not present

## 2014-09-16 DIAGNOSIS — G2581 Restless legs syndrome: Secondary | ICD-10-CM | POA: Diagnosis not present

## 2014-09-19 DIAGNOSIS — I482 Chronic atrial fibrillation: Secondary | ICD-10-CM | POA: Diagnosis not present

## 2014-10-03 DIAGNOSIS — I482 Chronic atrial fibrillation: Secondary | ICD-10-CM | POA: Diagnosis not present

## 2014-10-03 DIAGNOSIS — S8012XA Contusion of left lower leg, initial encounter: Secondary | ICD-10-CM | POA: Diagnosis not present

## 2014-10-28 DIAGNOSIS — I482 Chronic atrial fibrillation: Secondary | ICD-10-CM | POA: Diagnosis not present

## 2014-10-28 DIAGNOSIS — R21 Rash and other nonspecific skin eruption: Secondary | ICD-10-CM | POA: Diagnosis not present

## 2014-10-30 DIAGNOSIS — Z7901 Long term (current) use of anticoagulants: Secondary | ICD-10-CM | POA: Diagnosis not present

## 2014-10-30 DIAGNOSIS — K219 Gastro-esophageal reflux disease without esophagitis: Secondary | ICD-10-CM | POA: Diagnosis not present

## 2014-10-30 DIAGNOSIS — I10 Essential (primary) hypertension: Secondary | ICD-10-CM | POA: Diagnosis not present

## 2014-10-30 DIAGNOSIS — M79641 Pain in right hand: Secondary | ICD-10-CM | POA: Diagnosis not present

## 2014-10-30 DIAGNOSIS — Z79899 Other long term (current) drug therapy: Secondary | ICD-10-CM | POA: Diagnosis not present

## 2014-10-30 DIAGNOSIS — M19041 Primary osteoarthritis, right hand: Secondary | ICD-10-CM | POA: Diagnosis not present

## 2014-10-30 DIAGNOSIS — I4891 Unspecified atrial fibrillation: Secondary | ICD-10-CM | POA: Diagnosis not present

## 2014-10-30 DIAGNOSIS — M1811 Unilateral primary osteoarthritis of first carpometacarpal joint, right hand: Secondary | ICD-10-CM | POA: Diagnosis not present

## 2014-10-30 DIAGNOSIS — E039 Hypothyroidism, unspecified: Secondary | ICD-10-CM | POA: Diagnosis not present

## 2014-10-30 DIAGNOSIS — E780 Pure hypercholesterolemia: Secondary | ICD-10-CM | POA: Diagnosis not present

## 2014-11-04 ENCOUNTER — Other Ambulatory Visit: Payer: Self-pay | Admitting: Internal Medicine

## 2014-11-08 DIAGNOSIS — I482 Chronic atrial fibrillation: Secondary | ICD-10-CM | POA: Diagnosis not present

## 2014-11-26 ENCOUNTER — Encounter: Payer: Medicare Other | Admitting: *Deleted

## 2014-11-26 ENCOUNTER — Telehealth: Payer: Self-pay | Admitting: Cardiology

## 2014-11-26 NOTE — Telephone Encounter (Signed)
LMOVM reminding pt to send remote transmission.   

## 2014-11-27 ENCOUNTER — Encounter: Payer: Self-pay | Admitting: Cardiology

## 2014-12-03 ENCOUNTER — Telehealth: Payer: Self-pay | Admitting: Internal Medicine

## 2014-12-03 NOTE — Telephone Encounter (Signed)
New message      Pt is trying to send a remote---please call

## 2014-12-03 NOTE — Telephone Encounter (Signed)
LMTCB//sss 

## 2014-12-03 NOTE — Telephone Encounter (Signed)
Patient states that she was seeing an amber light when trying to send the transmission. I asked her the specific part of the monitor that showed the light and based on her description it was the battery. I asked her to change the batteries and try to resend the transmission. Patient voiced understanding. I encouraged her to call if she has any further problems. Patient voiced understanding.

## 2014-12-06 DIAGNOSIS — I482 Chronic atrial fibrillation: Secondary | ICD-10-CM | POA: Diagnosis not present

## 2015-01-06 DIAGNOSIS — I482 Chronic atrial fibrillation: Secondary | ICD-10-CM | POA: Diagnosis not present

## 2015-01-13 DIAGNOSIS — I482 Chronic atrial fibrillation: Secondary | ICD-10-CM | POA: Diagnosis not present

## 2015-01-17 DIAGNOSIS — I482 Chronic atrial fibrillation: Secondary | ICD-10-CM | POA: Diagnosis not present

## 2015-02-12 DIAGNOSIS — I482 Chronic atrial fibrillation: Secondary | ICD-10-CM | POA: Diagnosis not present

## 2015-02-27 DIAGNOSIS — M25511 Pain in right shoulder: Secondary | ICD-10-CM | POA: Diagnosis not present

## 2015-02-27 DIAGNOSIS — M542 Cervicalgia: Secondary | ICD-10-CM | POA: Diagnosis not present

## 2015-03-14 DIAGNOSIS — I482 Chronic atrial fibrillation: Secondary | ICD-10-CM | POA: Diagnosis not present

## 2015-04-01 DIAGNOSIS — I482 Chronic atrial fibrillation: Secondary | ICD-10-CM | POA: Diagnosis not present

## 2015-04-08 DIAGNOSIS — G2581 Restless legs syndrome: Secondary | ICD-10-CM | POA: Diagnosis not present

## 2015-04-08 DIAGNOSIS — R739 Hyperglycemia, unspecified: Secondary | ICD-10-CM | POA: Diagnosis not present

## 2015-04-08 DIAGNOSIS — K219 Gastro-esophageal reflux disease without esophagitis: Secondary | ICD-10-CM | POA: Diagnosis not present

## 2015-04-08 DIAGNOSIS — E782 Mixed hyperlipidemia: Secondary | ICD-10-CM | POA: Diagnosis not present

## 2015-04-08 DIAGNOSIS — I482 Chronic atrial fibrillation: Secondary | ICD-10-CM | POA: Diagnosis not present

## 2015-04-08 DIAGNOSIS — E039 Hypothyroidism, unspecified: Secondary | ICD-10-CM | POA: Diagnosis not present

## 2015-04-16 DIAGNOSIS — G2581 Restless legs syndrome: Secondary | ICD-10-CM | POA: Diagnosis not present

## 2015-04-16 DIAGNOSIS — Z1389 Encounter for screening for other disorder: Secondary | ICD-10-CM | POA: Diagnosis not present

## 2015-04-16 DIAGNOSIS — I482 Chronic atrial fibrillation: Secondary | ICD-10-CM | POA: Diagnosis not present

## 2015-04-16 DIAGNOSIS — Z0000 Encounter for general adult medical examination without abnormal findings: Secondary | ICD-10-CM | POA: Diagnosis not present

## 2015-04-30 DIAGNOSIS — S161XXA Strain of muscle, fascia and tendon at neck level, initial encounter: Secondary | ICD-10-CM | POA: Diagnosis not present

## 2015-05-02 ENCOUNTER — Encounter: Payer: Self-pay | Admitting: Internal Medicine

## 2015-05-02 ENCOUNTER — Ambulatory Visit (INDEPENDENT_AMBULATORY_CARE_PROVIDER_SITE_OTHER): Payer: Medicare Other | Admitting: Internal Medicine

## 2015-05-02 VITALS — BP 128/78 | HR 82 | Ht 63.0 in | Wt 156.0 lb

## 2015-05-02 DIAGNOSIS — I480 Paroxysmal atrial fibrillation: Secondary | ICD-10-CM

## 2015-05-02 DIAGNOSIS — I495 Sick sinus syndrome: Secondary | ICD-10-CM

## 2015-05-02 NOTE — Progress Notes (Signed)
PCP: Jeri Modena   Primary Cardiologist:  Dr Vic Blackbird is a 81 y.o. female who presents today for routine electrophysiology followup.  Since her last visit, she has done well.  Her afib is well controlled.  Today, she denies symptoms of chest pain, shortness of breath,  lower extremity edema, dizziness, presyncope, or syncope.  The patient is otherwise without complaint today.   Past Medical History  Diagnosis Date  . Vertigo   . Paroxysmal atrial fibrillation     on coumadin and amiodarone  . Sinus node dysfunction     s/p Medtronic dual-chamber pacemaker  . Hypercalcemia   . Hypertension   . Dyslipidemia   . CAD (coronary artery disease)     Two vessel, quiescent; 70% LAD aretery disease and well collateralized, 100% RCA by cardiac cath in 2004; NL LVF; low risk Cardioloite; EF 74%, 11/10  . Hypothyroidism    Past Surgical History  Procedure Laterality Date  . Pacemaker insertion  05/24/03    MDT Kappa  . Abdominal hysterectomy    . Fracture surgery    . Pacemaker generator change N/A 02/12/2013    Procedure: PACEMAKER GENERATOR CHANGE;  Surgeon: Deboraha Sprang, MD;  Location[facility] CATH LAB;  Service: Cardiovascular;  Laterality: N/A;    Current Outpatient Prescriptions  Medication Sig Dispense Refill  . amiodarone (PACERONE) 200 MG tablet Take 100 mg by mouth every morning.    Marland Kitchen amLODipine (NORVASC) 5 MG tablet Take 5 mg by mouth every morning.     . clonazePAM (KLONOPIN) 0.5 MG tablet Take 0.5 mg by mouth at bedtime. For restless leg syndrome    . isosorbide mononitrate (IMDUR) 30 MG 24 hr tablet Take 15 mg by mouth every morning.     Marland Kitchen levothyroxine (SYNTHROID, LEVOTHROID) 88 MCG tablet Take 88 mcg by mouth daily before breakfast.    . pantoprazole (PROTONIX) 40 MG tablet Take 40 mg by mouth every morning.     Marland Kitchen PREDNISONE PO Take 2 tablets by mouth daily.    . rosuvastatin (CRESTOR) 10 MG tablet Take 10 mg by mouth at bedtime.    Marland Kitchen warfarin  (COUMADIN) 2 MG tablet Take 2 mg by mouth See admin instructions. Take 1 tablet by mouth every day except Tuesdays. Do not take any Warfarin on Tuesdays     No current facility-administered medications for this visit.    Physical Exam: Filed Vitals:   05/02/15 1124  BP: 128/78  Pulse: 82  Height: 5\' 3"  (1.6 m)  Weight: 70.761 kg (156 lb)  SpO2: 97%    GEN- The patient is elderly appearing, alert and oriented x 3 today.   Head- normocephalic, atraumatic Eyes-  Sclera clear, conjunctiva pink Ears- hearing intact Oropharynx- clear Lungs- Clear to ausculation bilaterally, normal work of breathing Chest- pacemaker pocket is well healed Heart- Regular rate and rhythm, no murmurs, rubs or gallops, PMI not laterally displaced GI- soft, NT, ND, + BS Extremities- no clubbing, cyanosis, or edema  Pacemaker interrogation- reviewed in detail today,  See PACEART report  Assessment and Plan:  1. Sick sinus syndrome Normal pacemaker function See Pace Art report No changes today  2. afib  Well controlled with amiodarone She is presently on coumadin chads2vasc score is 5  Continue long term anticoagulation Check LFTs/TFTs today  3. HTN Stable No change required today   carelink every 3 months Return in 1 year Return to see Dr Bronson Ing as scheduled

## 2015-05-02 NOTE — Patient Instructions (Signed)
Your physician recommends that you continue on your current medications as directed. Please refer to the Current Medication list given to you today. Carelink device check on 08/04/15. Your physician recommends that you schedule a follow-up appointment in: 1 year with Dr. Allred. You will receive a reminder letter in the mail in about 10 months reminding you to call and schedule your appointment. If you don't receive this letter, please contact our office. 

## 2015-05-05 LAB — CUP PACEART INCLINIC DEVICE CHECK
Brady Statistic AP VP Percent: 1 %
Brady Statistic AP VS Percent: 69 %
Brady Statistic AS VP Percent: 1 %
Brady Statistic AS VS Percent: 29 %
Lead Channel Impedance Value: 482 Ohm
Lead Channel Impedance Value: 813 Ohm
Lead Channel Pacing Threshold Amplitude: 0.5 V
Lead Channel Pacing Threshold Amplitude: 0.75 V
Lead Channel Pacing Threshold Pulse Width: 0.4 ms
Lead Channel Sensing Intrinsic Amplitude: 2 mV
Lead Channel Sensing Intrinsic Amplitude: 8 mV
Lead Channel Setting Pacing Amplitude: 2.5 V
MDC IDC MSMT BATTERY IMPEDANCE: 136 Ohm
MDC IDC MSMT BATTERY REMAINING LONGEVITY: 139 mo
MDC IDC MSMT BATTERY VOLTAGE: 2.79 V
MDC IDC MSMT LEADCHNL RA PACING THRESHOLD PULSEWIDTH: 0.4 ms
MDC IDC SESS DTM: 20160805154655
MDC IDC SET LEADCHNL RA PACING AMPLITUDE: 2 V
MDC IDC SET LEADCHNL RV PACING PULSEWIDTH: 0.4 ms
MDC IDC SET LEADCHNL RV SENSING SENSITIVITY: 2.8 mV

## 2015-05-10 ENCOUNTER — Emergency Department (HOSPITAL_COMMUNITY): Payer: Medicare Other

## 2015-05-10 ENCOUNTER — Encounter (HOSPITAL_COMMUNITY): Payer: Self-pay | Admitting: Emergency Medicine

## 2015-05-10 ENCOUNTER — Emergency Department (HOSPITAL_COMMUNITY)
Admission: EM | Admit: 2015-05-10 | Discharge: 2015-05-10 | Disposition: A | Discharge status: Home / Self Care | Payer: Medicare Other | Attending: Emergency Medicine | Admitting: Emergency Medicine

## 2015-05-10 DIAGNOSIS — I10 Essential (primary) hypertension: Secondary | ICD-10-CM | POA: Insufficient documentation

## 2015-05-10 DIAGNOSIS — Z7901 Long term (current) use of anticoagulants: Secondary | ICD-10-CM | POA: Diagnosis not present

## 2015-05-10 DIAGNOSIS — R51 Headache: Secondary | ICD-10-CM | POA: Insufficient documentation

## 2015-05-10 DIAGNOSIS — E785 Hyperlipidemia, unspecified: Secondary | ICD-10-CM | POA: Insufficient documentation

## 2015-05-10 DIAGNOSIS — H6502 Acute serous otitis media, left ear: Secondary | ICD-10-CM | POA: Diagnosis not present

## 2015-05-10 DIAGNOSIS — I2510 Atherosclerotic heart disease of native coronary artery without angina pectoris: Secondary | ICD-10-CM | POA: Diagnosis not present

## 2015-05-10 DIAGNOSIS — R269 Unspecified abnormalities of gait and mobility: Secondary | ICD-10-CM | POA: Diagnosis not present

## 2015-05-10 DIAGNOSIS — R42 Dizziness and giddiness: Secondary | ICD-10-CM | POA: Diagnosis not present

## 2015-05-10 DIAGNOSIS — E039 Hypothyroidism, unspecified: Secondary | ICD-10-CM | POA: Diagnosis not present

## 2015-05-10 LAB — BASIC METABOLIC PANEL
Anion gap: 6 (ref 5–15)
BUN: 25 mg/dL — ABNORMAL HIGH (ref 6–20)
CALCIUM: 10.2 mg/dL (ref 8.9–10.3)
CO2: 26 mmol/L (ref 22–32)
Chloride: 107 mmol/L (ref 101–111)
Creatinine, Ser: 1.26 mg/dL — ABNORMAL HIGH (ref 0.44–1.00)
GFR calc Af Amer: 43 mL/min — ABNORMAL LOW (ref >60)
GFR, EST NON AFRICAN AMERICAN: 37 mL/min — AB (ref >60)
GLUCOSE: 119 mg/dL — AB (ref 65–99)
Potassium: 4.4 mmol/L (ref 3.5–5.1)
Sodium: 139 mmol/L (ref 135–145)

## 2015-05-10 LAB — CBC
HCT: 38.6 % (ref 36.0–46.0)
Hemoglobin: 12.5 g/dL (ref 12.0–15.0)
MCH: 29.9 pg (ref 26.0–34.0)
MCHC: 32.4 g/dL (ref 30.0–36.0)
MCV: 92.3 fL (ref 78.0–100.0)
Platelets: 183 10*3/uL (ref 150–400)
RBC: 4.18 MIL/uL (ref 3.87–5.11)
RDW: 13.6 % (ref 11.5–15.5)
WBC: 6.2 10*3/uL (ref 4.0–10.5)

## 2015-05-10 LAB — PROTIME-INR
INR: 1.93 — AB (ref 0.00–1.49)
Prothrombin Time: 21.9 seconds — ABNORMAL HIGH (ref 11.6–15.2)

## 2015-05-10 LAB — CBG MONITORING, ED: Glucose-Capillary: 135 mg/dL — ABNORMAL HIGH (ref 65–99)

## 2015-05-10 MED ORDER — MECLIZINE HCL 25 MG PO TABS: 25.0000 mg | ORAL_TABLET | Freq: Three times a day (TID) | ORAL | Status: DC | PRN

## 2015-05-10 MED ORDER — MECLIZINE HCL 12.5 MG PO TABS
25.0000 mg | ORAL_TABLET | Freq: Once | ORAL | Status: AC
  Administered 2015-05-10: 25 mg via ORAL
  Filled 2015-05-10: qty 2

## 2015-05-10 NOTE — ED Notes (Signed)
Pt. Reports vertigo/dizziness for several days. Pt. Reports pressure to left side of head starting today.

## 2015-05-10 NOTE — ED Provider Notes (Addendum)
CSN: [id number]     Arrival date & time 05/10/15  2015 History  This chart was scribed for Melanie Dakin, MD by Helane Gunther, ED Scribe. This patient was seen in room APA14/APA14 and the patient's care was started at 9:05 PM.    Chief Complaint  Patient presents with  . Dizziness   The history is provided by the patient and a relative. No language interpreter was used.   HPI Comments: Melanie [name] is a 81 y.o. female who presents to the Emergency Department complaining of dizziness onset 2 days ago. She reports associated pressure to the left side of the head around the ear onset earlier today. She states that she currently feels just as dizzy as at onset. Feels as if the room is spinning She called her PCP on 2 days ago who was not able to see her. She went to the drug store and got OTC travel anti-nausea medication, but states that she has not taken any because she was told not to by her PCP. She is on warfarin for A- Fib, as well as Norvasc and Protonix. She states her coumadin level was at 2 today. She states that she has seen a doctor today. Pt uses a walker and was able to walk with it today. She denies smoking and drinking alcohol. Pt denies nausea, vomiting, and weakness. She describes the dizziness as if the room is spinning. She reports no difficulty with vision no difficulty speaking no focal numbness or weakness. No other associated symptoms. No treatment prior to coming here dizziness is worse with changing positions and improved with remaining still  Per relative she is speaking normally.  Past Medical History  Diagnosis Date  . Vertigo   . Paroxysmal atrial fibrillation     on coumadin and amiodarone  . Sinus node dysfunction     s/p Medtronic dual-chamber pacemaker  . Hypercalcemia   . Hypertension   . Dyslipidemia   . CAD (coronary artery disease)     Two vessel, quiescent; 70% LAD aretery disease and well collateralized, 100% RCA by cardiac cath in 2004; NL LVF; low  risk Cardioloite; EF 74%, 11/10  . Hypothyroidism    Past Surgical History  Procedure Laterality Date  . Pacemaker insertion  05/24/03    MDT Kappa  . Abdominal hysterectomy    . Fracture surgery    . Pacemaker generator change N/A 02/12/2013    Procedure: PACEMAKER GENERATOR CHANGE;  Surgeon: Deboraha Sprang, MD;  Location: [facility] CATH LAB;  Service: Cardiovascular;  Laterality: N/A;   Family History  Problem Relation Age of Onset  . Hypertension Neg Hx   . Diabetes Neg Hx   . Coronary artery disease Neg Hx    Social History  Substance Use Topics  . Smoking status: Never Smoker   . Smokeless tobacco: Never Used  . Alcohol Use: No   OB History    No data available     Review of Systems  Musculoskeletal: Positive for gait problem.       Walks with walker  Neurological: Positive for dizziness and headaches.       Pressure sensation around left parietal area and left ear  All other systems reviewed and are negative.   Allergies  Ciprofloxacin; Hctz; and Lopid  Home Medications   Prior to Admission medications   Medication Sig Start Date End Date Taking? Authorizing Provider  amiodarone (PACERONE) 200 MG tablet Take 100 mg by mouth every morning.   Yes Historical  Provider, MD  amLODipine (NORVASC) 5 MG tablet Take 5 mg by mouth every morning.    Yes Historical Provider, MD  isosorbide mononitrate (IMDUR) 30 MG 24 hr tablet Take 15 mg by mouth every morning.    Yes Historical Provider, MD  levothyroxine (SYNTHROID, LEVOTHROID) 88 MCG tablet Take 88 mcg by mouth daily before breakfast.   Yes Historical Provider, MD  meclizine (ANTIVERT) 25 MG tablet Take 25 mg by mouth 3 (three) times daily as needed for dizziness.   Yes Historical Provider, MD  pantoprazole (PROTONIX) 40 MG tablet Take 40 mg by mouth every morning.    Yes Historical Provider, MD  PRESCRIPTION MEDICATION Inject 1 Dose as directed once. Cortisone injection   Yes Historical Provider, MD  rosuvastatin (CRESTOR) 10  MG tablet Take 10 mg by mouth at bedtime.   Yes Historical Provider, MD  warfarin (COUMADIN) 2 MG tablet Take 2 mg by mouth See admin instructions. Take 1 tablet by mouth every day except Tuesdays. Do not take any Warfarin on Tuesdays   Yes Historical Provider, MD  clonazePAM (KLONOPIN) 0.5 MG tablet Take 0.5 mg by mouth at bedtime. For restless leg syndrome    Historical Provider, MD   BP 145/58 mmHg  Pulse 70  Temp(Src) 98.6 F (37 C) (Oral)  Resp 18  Ht 5\' 3"  (1.6 m)  Wt 151 lb (68.493 kg)  BMI 26.76 kg/m2  SpO2 100% Physical Exam  Constitutional: She appears well-developed and well-nourished.  HENT:  Head: Normocephalic and atraumatic.  Eyes: Conjunctivae are normal. Pupils are equal, round, and reactive to light.  Neck: Neck supple. No tracheal deviation present. No thyromegaly present.  Cardiovascular: Normal rate.   No murmur heard. Irregularly irregular  Pulmonary/Chest: Effort normal and breath sounds normal.  Abdominal: Soft. Bowel sounds are normal. She exhibits no distension. There is no tenderness.  Musculoskeletal: Normal range of motion. She exhibits no edema or tenderness.  Neurological: She is alert. No cranial nerve deficit. Coordination normal.  Walks with minimal assistance. Cranial nerves II through XII grossly intact. DTRs symmetric bilaterally knee jerk ankle jerk biceps historical and bilaterally  Skin: Skin is warm and dry. No rash noted.  Psychiatric: She has a normal mood and affect.  Nursing note and vitals reviewed.   ED Course  Procedures  DIAGNOSTIC STUDIES: Oxygen Saturation is 96% on RA, adequate by my interpretation.    COORDINATION OF CARE: 9:12 PM - Discussed plans to order a CAT scan. Advised pt to use her walker to prevent future falls. Pt advised of plan for treatment and pt agrees.  Labs Review Labs Reviewed  BASIC METABOLIC PANEL  CBC  URINALYSIS, ROUTINE W REFLEX MICROSCOPIC (NOT AT [facility])  PROTIME-INR  CBG MONITORING, ED     Imaging Review Ct Head Wo Contrast  05/10/2015   CLINICAL DATA:  Dizziness for 2 days  EXAM: CT HEAD WITHOUT CONTRAST  TECHNIQUE: Contiguous axial images were obtained from the base of the skull through the vertex without intravenous contrast.  COMPARISON:  None.  FINDINGS: The bony calvarium is intact. Mild atrophic changes are seen. No findings to suggest acute hemorrhage, acute infarction or space-occupying mass lesion are noted.  IMPRESSION: Atrophic changes without acute abnormality.   Electronically Signed   By: Inez Catalina M.D.   On: 05/10/2015 21:50   I, Helane Gunther, personally reviewed and evaluated these images and lab results as part of my medical decision-making.   EKG Interpretation   Date/Time:  Saturday May 10 2015  21:21:27 EDT Ventricular Rate:  81 PR Interval:  150 QRS Duration: 94 QT Interval:  419 QTC Calculation: 486 R Axis:   -34 Text Interpretation:  Left axis deviation Abnormal R-wave progression,  early transition Borderline prolonged QT interval No significant change  since last tracing Reconfirmed by Cathey Fredenburg  MD, Lagina Reader [phone]) on 05/10/2015  9:30:27 PM     Correction rhythm is atrial fibrillation  11:40 PM patient feels improved after treatment with meclizine. Results for orders placed or performed during the hospital encounter of 32/12/24  Basic metabolic panel  Result Value Ref Range   Sodium 139 135 - 145 mmol/L   Potassium 4.4 3.5 - 5.1 mmol/L   Chloride 107 101 - 111 mmol/L   CO2 26 22 - 32 mmol/L   Glucose, Bld 119 (H) 65 - 99 mg/dL   BUN 25 (H) 6 - 20 mg/dL   Creatinine, Ser 1.26 (H) 0.44 - 1.00 mg/dL   Calcium 10.2 8.9 - 10.3 mg/dL   GFR calc non Af Amer 37 (L) >60 mL/min   GFR calc Af Amer 43 (L) >60 mL/min   Anion gap 6 5 - 15  CBC  Result Value Ref Range   WBC 6.2 4.0 - 10.5 K/uL   RBC 4.18 3.87 - 5.11 MIL/uL   Hemoglobin 12.5 12.0 - 15.0 g/dL   HCT 38.6 36.0 - 46.0 %   MCV 92.3 78.0 - 100.0 fL   MCH 29.9 26.0 - 34.0 pg    MCHC 32.4 30.0 - 36.0 g/dL   RDW 13.6 11.5 - 15.5 %   Platelets 183 150 - 400 K/uL  Protime-INR  Result Value Ref Range   Prothrombin Time 21.9 (H) 11.6 - 15.2 seconds   INR 1.93 (H) 0.00 - 1.49  CBG monitoring, ED  Result Value Ref Range   Glucose-Capillary 135 (H) 65 - 99 mg/dL   Ct Head Wo Contrast  05/10/2015   CLINICAL DATA:  Dizziness for 2 days  EXAM: CT HEAD WITHOUT CONTRAST  TECHNIQUE: Contiguous axial images were obtained from the base of the skull through the vertex without intravenous contrast.  COMPARISON:  None.  FINDINGS: The bony calvarium is intact. Mild atrophic changes are seen. No findings to suggest acute hemorrhage, acute infarction or space-occupying mass lesion are noted.  IMPRESSION: Atrophic changes without acute abnormality.   Electronically Signed   By: Inez Catalina M.D.   On: 05/10/2015 21:50    MDM  Vertigo is felt to be peripheral in etiology. She has vague ear complaints. Doubt posterior circulation stroke. No other focal neurologic deficit other than vertigo which is positional. She is minimally subtherapeutic on Coumadin however we'll not adjust dose, suggest recheck of INR in 1 week. Suggested to patient that she use her walker at all times. Prescription meclizine Diagnoses #1 vertigo #2 subtherapeutic INR Final diagnoses:  None      I personally performed the services described in this documentation, which was scribed in my presence. The recorded information has been reviewed and considered.    Melanie Dakin, MD 05/10/15 8250  Melanie Dakin, MD 05/10/15 2350

## 2015-05-10 NOTE — Discharge Instructions (Signed)
Dizziness Use your walker at all times to prevent falls. Take the medication prescribed as needed for vertigo. Your Coumadin level(INR) today was slightly low at 1.93. You should get your level rechecked in one week. Return if you feel worse for any reason. Benign Positional Vertigo Vertigo means you feel like you or your surroundings are moving when they are not. Benign positional vertigo is the most common form of vertigo. Benign means that the cause of your condition is not serious. Benign positional vertigo is more common in older adults. CAUSES  Benign positional vertigo is the result of an upset in the labyrinth system. This is an area in the middle ear that helps control your balance. This may be caused by a viral infection, head injury, or repetitive motion. However, often no specific cause is found. SYMPTOMS  Symptoms of benign positional vertigo occur when you move your head or eyes in different directions. Some of the symptoms may include:  Loss of balance and falls.  Vomiting.  Blurred vision.  Dizziness.  Nausea.  Involuntary eye movements (nystagmus). DIAGNOSIS  Benign positional vertigo is usually diagnosed by physical exam. If the specific cause of your benign positional vertigo is unknown, your caregiver may perform imaging tests, such as magnetic resonance imaging (MRI) or computed tomography (CT). TREATMENT  Your caregiver may recommend movements or procedures to correct the benign positional vertigo. Medicines such as meclizine, benzodiazepines, and medicines for nausea may be used to treat your symptoms. In rare cases, if your symptoms are caused by certain conditions that affect the inner ear, you may need surgery. HOME CARE INSTRUCTIONS   Follow your caregiver's instructions.  Move slowly. Do not make sudden body or head movements.  Avoid driving.  Avoid operating heavy machinery.  Avoid performing any tasks that would be dangerous to you or others during a  vertigo episode.  Drink enough fluids to keep your urine clear or pale yellow. SEEK IMMEDIATE MEDICAL CARE IF:   You develop problems with walking, weakness, numbness, or using your arms, hands, or legs.  You have difficulty speaking.  You develop severe headaches.  Your nausea or vomiting continues or gets worse.  You develop visual changes.  Your family or friends notice any behavioral changes.  Your condition gets worse.  You have a fever.  You develop a stiff neck or sensitivity to light. MAKE SURE YOU:   Understand these instructions.  Will watch your condition.  Will get help right away if you are not doing well or get worse. Document Released: 06/21/2006 Document Revised: 12/06/2011 Document Reviewed: 06/03/2011 [facility] Patient Information 2015 [place], [place]. This information is not intended to replace advice given to you by your health care provider. Make sure you discuss any questions you have with your health care provider.

## 2015-05-19 DIAGNOSIS — H8112 Benign paroxysmal vertigo, left ear: Secondary | ICD-10-CM | POA: Diagnosis not present

## 2015-05-30 ENCOUNTER — Other Ambulatory Visit: Payer: Self-pay | Admitting: Internal Medicine

## 2015-06-03 DIAGNOSIS — H8112 Benign paroxysmal vertigo, left ear: Secondary | ICD-10-CM | POA: Diagnosis not present

## 2015-06-24 DIAGNOSIS — I482 Chronic atrial fibrillation: Secondary | ICD-10-CM | POA: Diagnosis not present

## 2015-06-26 DIAGNOSIS — I482 Chronic atrial fibrillation: Secondary | ICD-10-CM | POA: Diagnosis not present

## 2015-07-01 DIAGNOSIS — I10 Essential (primary) hypertension: Secondary | ICD-10-CM | POA: Diagnosis not present

## 2015-07-01 DIAGNOSIS — E782 Mixed hyperlipidemia: Secondary | ICD-10-CM | POA: Diagnosis not present

## 2015-07-08 DIAGNOSIS — G2581 Restless legs syndrome: Secondary | ICD-10-CM | POA: Diagnosis not present

## 2015-07-08 DIAGNOSIS — I482 Chronic atrial fibrillation: Secondary | ICD-10-CM | POA: Diagnosis not present

## 2015-07-08 DIAGNOSIS — E782 Mixed hyperlipidemia: Secondary | ICD-10-CM | POA: Diagnosis not present

## 2015-07-08 DIAGNOSIS — I10 Essential (primary) hypertension: Secondary | ICD-10-CM | POA: Diagnosis not present

## 2015-07-08 DIAGNOSIS — E039 Hypothyroidism, unspecified: Secondary | ICD-10-CM | POA: Diagnosis not present

## 2015-07-23 DIAGNOSIS — R339 Retention of urine, unspecified: Secondary | ICD-10-CM | POA: Diagnosis not present

## 2015-08-04 ENCOUNTER — Telehealth: Payer: Self-pay | Admitting: Cardiology

## 2015-08-04 ENCOUNTER — Encounter: Payer: Medicare Other | Admitting: *Deleted

## 2015-08-04 NOTE — Telephone Encounter (Signed)
LMOVM reminding pt to send remote transmission.   

## 2015-08-05 ENCOUNTER — Encounter: Payer: Self-pay | Admitting: Cardiology

## 2015-08-08 DIAGNOSIS — I482 Chronic atrial fibrillation: Secondary | ICD-10-CM | POA: Diagnosis not present

## 2015-08-15 ENCOUNTER — Ambulatory Visit (INDEPENDENT_AMBULATORY_CARE_PROVIDER_SITE_OTHER): Payer: Medicare Other | Admitting: *Deleted

## 2015-08-15 DIAGNOSIS — I495 Sick sinus syndrome: Secondary | ICD-10-CM | POA: Diagnosis not present

## 2015-08-15 NOTE — Progress Notes (Signed)
Remote pacemaker transmission.   

## 2015-08-29 LAB — CUP PACEART REMOTE DEVICE CHECK
Battery Impedance: 159 Ohm
Battery Remaining Longevity: 134 mo
Battery Voltage: 2.79 V
Brady Statistic AP VP Percent: 1 %
Brady Statistic AS VS Percent: 30 %
Date Time Interrogation Session: 20161118172536
Implantable Lead Location: 753859
Lead Channel Impedance Value: 489 Ohm
Lead Channel Pacing Threshold Amplitude: 0.5 V
Lead Channel Pacing Threshold Pulse Width: 0.4 ms
Lead Channel Sensing Intrinsic Amplitude: 1 mV
Lead Channel Sensing Intrinsic Amplitude: 5.6 mV
Lead Channel Setting Pacing Pulse Width: 0.4 ms
MDC IDC LEAD IMPLANT DT: 20040827
MDC IDC LEAD IMPLANT DT: 20040827
MDC IDC LEAD LOCATION: 753860
MDC IDC MSMT LEADCHNL RV IMPEDANCE VALUE: 830 Ohm
MDC IDC MSMT LEADCHNL RV PACING THRESHOLD AMPLITUDE: 1.5 V
MDC IDC MSMT LEADCHNL RV PACING THRESHOLD PULSEWIDTH: 0.4 ms
MDC IDC SET LEADCHNL RA PACING AMPLITUDE: 2 V
MDC IDC SET LEADCHNL RV PACING AMPLITUDE: 2.5 V
MDC IDC SET LEADCHNL RV SENSING SENSITIVITY: 2.8 mV
MDC IDC STAT BRADY AP VS PERCENT: 68 %
MDC IDC STAT BRADY AS VP PERCENT: 1 %

## 2015-08-31 ENCOUNTER — Encounter (HOSPITAL_COMMUNITY): Payer: Self-pay | Admitting: Emergency Medicine

## 2015-08-31 ENCOUNTER — Emergency Department (HOSPITAL_COMMUNITY): Payer: Medicare Other

## 2015-08-31 ENCOUNTER — Emergency Department (HOSPITAL_COMMUNITY)
Admission: EM | Admit: 2015-08-31 | Discharge: 2015-08-31 | Disposition: A | Discharge status: Home / Self Care | Payer: Medicare Other | Attending: Emergency Medicine | Admitting: Emergency Medicine

## 2015-08-31 DIAGNOSIS — R0981 Nasal congestion: Secondary | ICD-10-CM | POA: Diagnosis not present

## 2015-08-31 DIAGNOSIS — I10 Essential (primary) hypertension: Secondary | ICD-10-CM | POA: Insufficient documentation

## 2015-08-31 DIAGNOSIS — M7989 Other specified soft tissue disorders: Secondary | ICD-10-CM | POA: Insufficient documentation

## 2015-08-31 DIAGNOSIS — E039 Hypothyroidism, unspecified: Secondary | ICD-10-CM | POA: Diagnosis not present

## 2015-08-31 DIAGNOSIS — R067 Sneezing: Secondary | ICD-10-CM | POA: Insufficient documentation

## 2015-08-31 DIAGNOSIS — Z7901 Long term (current) use of anticoagulants: Secondary | ICD-10-CM | POA: Insufficient documentation

## 2015-08-31 DIAGNOSIS — I2510 Atherosclerotic heart disease of native coronary artery without angina pectoris: Secondary | ICD-10-CM | POA: Diagnosis not present

## 2015-08-31 DIAGNOSIS — R110 Nausea: Secondary | ICD-10-CM | POA: Diagnosis not present

## 2015-08-31 DIAGNOSIS — M25551 Pain in right hip: Secondary | ICD-10-CM | POA: Diagnosis not present

## 2015-08-31 DIAGNOSIS — I480 Paroxysmal atrial fibrillation: Secondary | ICD-10-CM | POA: Diagnosis not present

## 2015-08-31 DIAGNOSIS — Z79899 Other long term (current) drug therapy: Secondary | ICD-10-CM | POA: Diagnosis not present

## 2015-08-31 DIAGNOSIS — J3489 Other specified disorders of nose and nasal sinuses: Secondary | ICD-10-CM | POA: Insufficient documentation

## 2015-08-31 DIAGNOSIS — E785 Hyperlipidemia, unspecified: Secondary | ICD-10-CM | POA: Insufficient documentation

## 2015-08-31 MED ORDER — HYDROCODONE-ACETAMINOPHEN 5-325 MG PO TABS
1.0000 | ORAL_TABLET | Freq: Once | ORAL | Status: AC
  Administered 2015-08-31: 1 via ORAL
  Filled 2015-08-31: qty 1

## 2015-08-31 MED ORDER — HYDROCODONE-ACETAMINOPHEN 5-325 MG PO TABS: 1.0000 | ORAL_TABLET | ORAL | Status: DC | PRN

## 2015-08-31 NOTE — ED Notes (Signed)
Patient c/o right hip pain x2 days. Denies any known injury. Per patient taking tylenol with no relief. Per patient can "hardly" bear weight on right side. Patient c/o chest congestion with sneezing and runny nose. Denies any chest pain, fevers, or coughing.

## 2015-08-31 NOTE — ED Provider Notes (Signed)
CSN: [id number]     Arrival date & time 08/31/15  1145 History  By signing my name below, I, Eustaquio Maize, attest that this documentation has been prepared under the direction and in the presence of Elnora Morrison, MD. Electronically Signed: Eustaquio Maize, ED Scribe. 08/31/2015. 12:03 PM.  Chief Complaint  Patient presents with  . Hip Pain   Patient is a 81 y.o. female presenting with hip pain. The history is provided by the patient. No language interpreter was used.  Hip Pain This is a new problem. The current episode started 2 days ago. The problem occurs rarely. The problem has not changed since onset.Pertinent negatives include no chest pain, no abdominal pain and no shortness of breath. The symptoms are aggravated by walking. Nothing relieves the symptoms. She has tried acetaminophen for the symptoms. The treatment provided no relief.     HPI Comments: Melanie [name] is a 81 y.o. female who presents to the Emergency Department complaining of gradual onset, constant, right hip pain x 2-3 days. No known injury or strenuous activity that could have caused the pain. The pain is exacerbated with walking. Pt has been taking Tylenol for the pain without relief. She also complains of increased swelling in bilateral legs, congestion, rhinorrhea, and sneezing. Pt reports that she has been intermittently nauseous as well. Denies chest pain, cough, shortness of breath, abdominal pain, vomiting, or any other associated symptoms. No previous hip surgery.   Past Medical History  Diagnosis Date  . Vertigo   . Paroxysmal atrial fibrillation (HCC)     on coumadin and amiodarone  . Sinus node dysfunction (HCC)     s/p Medtronic dual-chamber pacemaker  . Hypercalcemia   . Hypertension   . Dyslipidemia   . CAD (coronary artery disease)     Two vessel, quiescent; 70% LAD aretery disease and well collateralized, 100% RCA by cardiac cath in 2004; NL LVF; low risk Cardioloite; EF 74%, 11/10  . Hypothyroidism     Past Surgical History  Procedure Laterality Date  . Pacemaker insertion  05/24/03    MDT Kappa  . Abdominal hysterectomy    . Fracture surgery    . Pacemaker generator change N/A 02/12/2013    Procedure: PACEMAKER GENERATOR CHANGE;  Surgeon: Deboraha Sprang, MD;  Location: [facility] CATH LAB;  Service: Cardiovascular;  Laterality: N/A;   Family History  Problem Relation Age of Onset  . Hypertension Neg Hx   . Diabetes Neg Hx   . Coronary artery disease Neg Hx    Social History  Substance Use Topics  . Smoking status: Never Smoker   . Smokeless tobacco: Never Used  . Alcohol Use: No   OB History    No data available     Review of Systems  HENT: Positive for congestion, rhinorrhea and sneezing.   Respiratory: Negative for cough and shortness of breath.   Cardiovascular: Positive for leg swelling. Negative for chest pain.  Gastrointestinal: Positive for nausea. Negative for vomiting and abdominal pain.  Musculoskeletal: Positive for arthralgias (Right hip).  All other systems reviewed and are negative.  Allergies  Ciprofloxacin; Hctz; and Lopid  Home Medications   Prior to Admission medications   Medication Sig Start Date End Date Taking? Authorizing Provider  amiodarone (PACERONE) 200 MG tablet Take 100 mg by mouth every morning.   Yes Historical Provider, MD  amLODipine (NORVASC) 5 MG tablet Take 5 mg by mouth every morning.    Yes Historical Provider, MD  clonazePAM (KLONOPIN) 0.5 MG  tablet Take 0.5 mg by mouth at bedtime. For restless leg syndrome   Yes Historical Provider, MD  guaiFENesin (MUCINEX) 600 MG 12 hr tablet Take 1,200 mg by mouth 2 (two) times daily.   Yes Historical Provider, MD  isosorbide mononitrate (IMDUR) 30 MG 24 hr tablet Take 15 mg by mouth every morning.    Yes Historical Provider, MD  levothyroxine (SYNTHROID, LEVOTHROID) 88 MCG tablet Take 88 mcg by mouth daily before breakfast.   Yes Historical Provider, MD  meclizine (ANTIVERT) 25 MG tablet Take 1  tablet (25 mg total) by mouth 3 (three) times daily as needed for dizziness. 05/10/15  Yes Orlie Dakin, MD  pantoprazole (PROTONIX) 40 MG tablet Take 40 mg by mouth every morning.    Yes Historical Provider, MD  rosuvastatin (CRESTOR) 10 MG tablet Take 10 mg by mouth at bedtime.   Yes Historical Provider, MD  warfarin (COUMADIN) 2 MG tablet Take 2 mg by mouth See admin instructions. Take 1 tablet by mouth every day except Tuesdays and Fridays. Do not take any Warfarin on Tuesdays or Fridays.   Yes Historical Provider, MD  HYDROcodone-acetaminophen (NORCO) 5-325 MG tablet Take 1 tablet by mouth every 4 (four) hours as needed. 08/31/15   Elnora Morrison, MD   Triage Vitals: BP 144/57 mmHg  Pulse 80  Temp(Src) 98.3 F (36.8 C) (Oral)  Resp 20  Ht 5\' 3"  (1.6 m)  Wt 156 lb (70.761 kg)  BMI 27.64 kg/m2  SpO2 93%   Physical Exam  Constitutional: She is oriented to person, place, and time. She appears well-developed and well-nourished. No distress.  HENT:  Head: Normocephalic and atraumatic.  Eyes: Conjunctivae and EOM are normal.  Neck: Neck supple. No tracheal deviation present.  Cardiovascular: Normal rate and regular rhythm.   Pulmonary/Chest: Effort normal and breath sounds normal. No respiratory distress. She has no wheezes. She has no rhonchi. She has no rales.  Musculoskeletal: Normal range of motion.  Tenderness to palpation on posterior right hip/latearl gluteus; No ecchymosis or rash appreciated 5+ strength in flexion and extension of the knee Mild pain with hip flexion No significant swelling in the leg   Neurological: She is alert and oriented to person, place, and time.  Skin: Skin is warm and dry.  Psychiatric: She has a normal mood and affect. Her behavior is normal.  Nursing note and vitals reviewed.   ED Course  Procedures (including critical care time)  DIAGNOSTIC STUDIES: Oxygen Saturation is 93% on RA, adequate by my interpretation.    COORDINATION OF  CARE: 12:03 PM-Discussed treatment plan which includes DG R Pelvis with pt at bedside and pt agreed to plan.   Labs Review Labs Reviewed - No data to display  Imaging Review Dg Hip Unilat With Pelvis 1v Right  08/31/2015  CLINICAL DATA:  Right hip pain for 2 days.  No known injury. EXAM: DG HIP (WITH OR WITHOUT PELVIS) 1V RIGHT COMPARISON:  Pelvic CT 08/10/2014. FINDINGS: The bones are demineralized. Patient is status post right femoral ORIF with a dynamic compression screw. There is a long stem intramedullary nail with a distal interlocking screw and proximal cerclage wires. There is no evidence of hardware loosening, acute fracture or dislocation. There is no evidence of femoral head avascular necrosis. The hip joint spaces are maintained. Convex left lumbar scoliosis noted. IMPRESSION: Status post ORIF of the proximal right femur. No acute osseous findings. Electronically Signed   By: Richardean Sale M.D.   On: 08/31/2015 13:29  I have personally reviewed and evaluated these images as part of my medical decision-making.   EKG Interpretation None      MDM   Final diagnoses:  Acute hip pain, right   I personally performed the services described in this documentation, which was scribed in my presence. The recorded information has been reviewed and is accurate.  X-ray no acute fracture. Pain meds given. Few pain meds per home until she sees her outpatient doctor. Concern for arthritis versus sciatica versus SI joint pain, no concern for septic joint at this time. Results and differential diagnosis were discussed with the patient/parent/guardian. Xrays were independently reviewed by myself.  Close follow up outpatient was discussed, comfortable with the plan.   Medications  HYDROcodone-acetaminophen (NORCO/VICODIN) 5-325 MG per tablet 1 tablet (1 tablet Oral Given 08/31/15 1225)    Filed Vitals:   08/31/15 1155  BP: 144/57  Pulse: 80  Temp: 98.3 F (36.8 C)  TempSrc: Oral  Resp:  20  Height: 5\' 3"  (1.6 m)  Weight: 156 lb (70.761 kg)  SpO2: 93%    Final diagnoses:  Acute hip pain, right       Elnora Morrison, MD 08/31/15 1339

## 2015-08-31 NOTE — Discharge Instructions (Signed)
For severe pain take norco or vicodin however realize they have the potential for addiction and it can make you sleepy and has tylenol in it.  No operating machinery while taking. If you were given medicines take as directed.  If you are on coumadin or contraceptives realize their levels and effectiveness is altered by many different medicines.  If you have any reaction (rash, tongues swelling, other) to the medicines stop taking and see a physician.    If your blood pressure was elevated in the ER make sure you follow up for management with a primary doctor or return for chest pain, shortness of breath or stroke symptoms.  Please follow up as directed and return to the ER or see a physician for new or worsening symptoms.  Thank you. Filed Vitals:   08/31/15 1155  BP: 144/57  Pulse: 80  Temp: 98.3 F (36.8 C)  TempSrc: Oral  Resp: 20  Height: 5\' 3"  (1.6 m)  Weight: 156 lb (70.761 kg)  SpO2: 93%

## 2015-09-01 ENCOUNTER — Encounter: Payer: Self-pay | Admitting: Cardiology

## 2015-09-01 DIAGNOSIS — M25551 Pain in right hip: Secondary | ICD-10-CM | POA: Diagnosis not present

## 2015-09-08 DIAGNOSIS — I482 Chronic atrial fibrillation: Secondary | ICD-10-CM | POA: Diagnosis not present

## 2015-09-15 DIAGNOSIS — M25551 Pain in right hip: Secondary | ICD-10-CM | POA: Diagnosis not present

## 2015-09-19 DIAGNOSIS — Z9889 Other specified postprocedural states: Secondary | ICD-10-CM | POA: Diagnosis not present

## 2015-09-19 DIAGNOSIS — M25551 Pain in right hip: Secondary | ICD-10-CM | POA: Diagnosis not present

## 2015-09-20 DIAGNOSIS — S0083XA Contusion of other part of head, initial encounter: Secondary | ICD-10-CM | POA: Diagnosis not present

## 2015-09-20 DIAGNOSIS — W19XXXA Unspecified fall, initial encounter: Secondary | ICD-10-CM | POA: Diagnosis not present

## 2015-09-20 DIAGNOSIS — I10 Essential (primary) hypertension: Secondary | ICD-10-CM | POA: Diagnosis not present

## 2015-09-20 DIAGNOSIS — I4891 Unspecified atrial fibrillation: Secondary | ICD-10-CM | POA: Diagnosis not present

## 2015-09-20 DIAGNOSIS — E7800 Pure hypercholesterolemia, unspecified: Secondary | ICD-10-CM | POA: Diagnosis not present

## 2015-09-20 DIAGNOSIS — S0990XA Unspecified injury of head, initial encounter: Secondary | ICD-10-CM | POA: Diagnosis not present

## 2015-09-20 DIAGNOSIS — Z7901 Long term (current) use of anticoagulants: Secondary | ICD-10-CM | POA: Diagnosis not present

## 2015-09-20 DIAGNOSIS — M25531 Pain in right wrist: Secondary | ICD-10-CM | POA: Diagnosis not present

## 2015-09-20 DIAGNOSIS — E039 Hypothyroidism, unspecified: Secondary | ICD-10-CM | POA: Diagnosis not present

## 2015-09-20 DIAGNOSIS — S52531A Colles' fracture of right radius, initial encounter for closed fracture: Secondary | ICD-10-CM | POA: Diagnosis not present

## 2015-09-20 DIAGNOSIS — Z79899 Other long term (current) drug therapy: Secondary | ICD-10-CM | POA: Diagnosis not present

## 2015-09-20 DIAGNOSIS — S52501A Unspecified fracture of the lower end of right radius, initial encounter for closed fracture: Secondary | ICD-10-CM | POA: Diagnosis not present

## 2015-09-23 DIAGNOSIS — M1990 Unspecified osteoarthritis, unspecified site: Secondary | ICD-10-CM | POA: Diagnosis not present

## 2015-09-23 DIAGNOSIS — W07XXXA Fall from chair, initial encounter: Secondary | ICD-10-CM | POA: Diagnosis not present

## 2015-09-23 DIAGNOSIS — R0789 Other chest pain: Secondary | ICD-10-CM | POA: Diagnosis not present

## 2015-09-23 DIAGNOSIS — Z79899 Other long term (current) drug therapy: Secondary | ICD-10-CM | POA: Diagnosis not present

## 2015-09-23 DIAGNOSIS — Z7901 Long term (current) use of anticoagulants: Secondary | ICD-10-CM | POA: Diagnosis not present

## 2015-09-23 DIAGNOSIS — R531 Weakness: Secondary | ICD-10-CM | POA: Diagnosis not present

## 2015-09-23 DIAGNOSIS — I10 Essential (primary) hypertension: Secondary | ICD-10-CM | POA: Diagnosis not present

## 2015-09-24 DIAGNOSIS — K219 Gastro-esophageal reflux disease without esophagitis: Secondary | ICD-10-CM | POA: Diagnosis not present

## 2015-09-24 DIAGNOSIS — G4700 Insomnia, unspecified: Secondary | ICD-10-CM | POA: Diagnosis not present

## 2015-09-24 DIAGNOSIS — N390 Urinary tract infection, site not specified: Secondary | ICD-10-CM | POA: Diagnosis not present

## 2015-09-24 DIAGNOSIS — W07XXXD Fall from chair, subsequent encounter: Secondary | ICD-10-CM | POA: Diagnosis not present

## 2015-09-24 DIAGNOSIS — R791 Abnormal coagulation profile: Secondary | ICD-10-CM | POA: Diagnosis not present

## 2015-09-24 DIAGNOSIS — S52531A Colles' fracture of right radius, initial encounter for closed fracture: Secondary | ICD-10-CM | POA: Diagnosis not present

## 2015-09-24 DIAGNOSIS — F419 Anxiety disorder, unspecified: Secondary | ICD-10-CM | POA: Diagnosis not present

## 2015-09-24 DIAGNOSIS — I4891 Unspecified atrial fibrillation: Secondary | ICD-10-CM | POA: Diagnosis not present

## 2015-09-24 DIAGNOSIS — W07XXXA Fall from chair, initial encounter: Secondary | ICD-10-CM | POA: Diagnosis not present

## 2015-09-24 DIAGNOSIS — E039 Hypothyroidism, unspecified: Secondary | ICD-10-CM | POA: Diagnosis not present

## 2015-09-24 DIAGNOSIS — G2581 Restless legs syndrome: Secondary | ICD-10-CM | POA: Diagnosis not present

## 2015-09-24 DIAGNOSIS — E782 Mixed hyperlipidemia: Secondary | ICD-10-CM | POA: Diagnosis not present

## 2015-09-24 DIAGNOSIS — I482 Chronic atrial fibrillation: Secondary | ICD-10-CM | POA: Diagnosis not present

## 2015-09-24 DIAGNOSIS — E7800 Pure hypercholesterolemia, unspecified: Secondary | ICD-10-CM | POA: Diagnosis not present

## 2015-09-24 DIAGNOSIS — Z950 Presence of cardiac pacemaker: Secondary | ICD-10-CM | POA: Diagnosis not present

## 2015-09-24 DIAGNOSIS — S62001G Unspecified fracture of navicular [scaphoid] bone of right wrist, subsequent encounter for fracture with delayed healing: Secondary | ICD-10-CM | POA: Diagnosis not present

## 2015-09-24 DIAGNOSIS — I10 Essential (primary) hypertension: Secondary | ICD-10-CM | POA: Diagnosis not present

## 2015-09-24 DIAGNOSIS — S52501D Unspecified fracture of the lower end of right radius, subsequent encounter for closed fracture with routine healing: Secondary | ICD-10-CM | POA: Diagnosis not present

## 2015-09-24 DIAGNOSIS — M1380 Other specified arthritis, unspecified site: Secondary | ICD-10-CM | POA: Diagnosis not present

## 2015-09-24 DIAGNOSIS — S62001A Unspecified fracture of navicular [scaphoid] bone of right wrist, initial encounter for closed fracture: Secondary | ICD-10-CM | POA: Diagnosis not present

## 2015-09-24 DIAGNOSIS — M810 Age-related osteoporosis without current pathological fracture: Secondary | ICD-10-CM | POA: Diagnosis not present

## 2015-09-24 DIAGNOSIS — R110 Nausea: Secondary | ICD-10-CM | POA: Diagnosis not present

## 2015-09-26 DIAGNOSIS — I4891 Unspecified atrial fibrillation: Secondary | ICD-10-CM | POA: Diagnosis not present

## 2015-10-08 DIAGNOSIS — S52501D Unspecified fracture of the lower end of right radius, subsequent encounter for closed fracture with routine healing: Secondary | ICD-10-CM | POA: Diagnosis not present

## 2015-10-09 DIAGNOSIS — E039 Hypothyroidism, unspecified: Secondary | ICD-10-CM | POA: Diagnosis not present

## 2015-10-09 DIAGNOSIS — E782 Mixed hyperlipidemia: Secondary | ICD-10-CM | POA: Diagnosis not present

## 2015-10-09 DIAGNOSIS — R110 Nausea: Secondary | ICD-10-CM | POA: Diagnosis not present

## 2015-10-09 DIAGNOSIS — I482 Chronic atrial fibrillation: Secondary | ICD-10-CM | POA: Diagnosis not present

## 2015-10-09 DIAGNOSIS — I10 Essential (primary) hypertension: Secondary | ICD-10-CM | POA: Diagnosis not present

## 2015-10-09 DIAGNOSIS — G2581 Restless legs syndrome: Secondary | ICD-10-CM | POA: Diagnosis not present

## 2015-10-15 DIAGNOSIS — I482 Chronic atrial fibrillation: Secondary | ICD-10-CM | POA: Diagnosis not present

## 2015-10-15 DIAGNOSIS — S52501D Unspecified fracture of the lower end of right radius, subsequent encounter for closed fracture with routine healing: Secondary | ICD-10-CM | POA: Diagnosis not present

## 2015-10-22 DIAGNOSIS — I482 Chronic atrial fibrillation: Secondary | ICD-10-CM | POA: Diagnosis not present

## 2015-10-27 DIAGNOSIS — I482 Chronic atrial fibrillation: Secondary | ICD-10-CM | POA: Diagnosis not present

## 2015-10-29 DIAGNOSIS — S52501D Unspecified fracture of the lower end of right radius, subsequent encounter for closed fracture with routine healing: Secondary | ICD-10-CM | POA: Diagnosis not present

## 2015-10-31 DIAGNOSIS — I482 Chronic atrial fibrillation: Secondary | ICD-10-CM | POA: Diagnosis not present

## 2015-11-04 DIAGNOSIS — I482 Chronic atrial fibrillation: Secondary | ICD-10-CM | POA: Diagnosis not present

## 2015-11-04 DIAGNOSIS — I2510 Atherosclerotic heart disease of native coronary artery without angina pectoris: Secondary | ICD-10-CM | POA: Diagnosis not present

## 2015-11-04 DIAGNOSIS — M80031D Age-related osteoporosis with current pathological fracture, right forearm, subsequent encounter for fracture with routine healing: Secondary | ICD-10-CM | POA: Diagnosis not present

## 2015-11-04 DIAGNOSIS — I10 Essential (primary) hypertension: Secondary | ICD-10-CM | POA: Diagnosis not present

## 2015-11-06 DIAGNOSIS — I2510 Atherosclerotic heart disease of native coronary artery without angina pectoris: Secondary | ICD-10-CM | POA: Diagnosis not present

## 2015-11-06 DIAGNOSIS — I482 Chronic atrial fibrillation: Secondary | ICD-10-CM | POA: Diagnosis not present

## 2015-11-06 DIAGNOSIS — M80031D Age-related osteoporosis with current pathological fracture, right forearm, subsequent encounter for fracture with routine healing: Secondary | ICD-10-CM | POA: Diagnosis not present

## 2015-11-06 DIAGNOSIS — I10 Essential (primary) hypertension: Secondary | ICD-10-CM | POA: Diagnosis not present

## 2015-11-10 ENCOUNTER — Ambulatory Visit (INDEPENDENT_AMBULATORY_CARE_PROVIDER_SITE_OTHER): Payer: Medicare Other | Admitting: *Deleted

## 2015-11-10 ENCOUNTER — Telehealth: Payer: Self-pay | Admitting: Cardiology

## 2015-11-10 DIAGNOSIS — I495 Sick sinus syndrome: Secondary | ICD-10-CM

## 2015-11-10 DIAGNOSIS — I482 Chronic atrial fibrillation: Secondary | ICD-10-CM | POA: Diagnosis not present

## 2015-11-10 NOTE — Telephone Encounter (Signed)
Spoke with pt and reminded pt of remote transmission that is due today. Pt verbalized understanding.   

## 2015-11-10 NOTE — Progress Notes (Signed)
Remote pacemaker transmission.   

## 2015-11-11 DIAGNOSIS — M80031D Age-related osteoporosis with current pathological fracture, right forearm, subsequent encounter for fracture with routine healing: Secondary | ICD-10-CM | POA: Diagnosis not present

## 2015-11-11 DIAGNOSIS — I2510 Atherosclerotic heart disease of native coronary artery without angina pectoris: Secondary | ICD-10-CM | POA: Diagnosis not present

## 2015-11-11 DIAGNOSIS — I10 Essential (primary) hypertension: Secondary | ICD-10-CM | POA: Diagnosis not present

## 2015-11-11 DIAGNOSIS — I482 Chronic atrial fibrillation: Secondary | ICD-10-CM | POA: Diagnosis not present

## 2015-11-12 ENCOUNTER — Encounter: Payer: Self-pay | Admitting: Cardiology

## 2015-11-12 LAB — CUP PACEART REMOTE DEVICE CHECK
Battery Remaining Longevity: 134 mo
Battery Voltage: 2.79 V
Date Time Interrogation Session: 20170213155642
Implantable Lead Implant Date: 20040827
Implantable Lead Location: 753859
Implantable Lead Model: 4092
Lead Channel Pacing Threshold Pulse Width: 0.4 ms
Lead Channel Setting Pacing Amplitude: 2 V
Lead Channel Setting Pacing Pulse Width: 0.4 ms
Lead Channel Setting Sensing Sensitivity: 2.8 mV
MDC IDC LEAD IMPLANT DT: 20040827
MDC IDC LEAD LOCATION: 753860
MDC IDC MSMT BATTERY IMPEDANCE: 160 Ohm
MDC IDC MSMT LEADCHNL RA IMPEDANCE VALUE: 475 Ohm
MDC IDC MSMT LEADCHNL RA PACING THRESHOLD AMPLITUDE: 0.5 V
MDC IDC MSMT LEADCHNL RA PACING THRESHOLD PULSEWIDTH: 0.4 ms
MDC IDC MSMT LEADCHNL RA SENSING INTR AMPL: 1.4 mV
MDC IDC MSMT LEADCHNL RV IMPEDANCE VALUE: 995 Ohm
MDC IDC MSMT LEADCHNL RV PACING THRESHOLD AMPLITUDE: 1.625 V
MDC IDC MSMT LEADCHNL RV SENSING INTR AMPL: 5.6 mV
MDC IDC SET LEADCHNL RV PACING AMPLITUDE: 2.5 V
MDC IDC STAT BRADY AP VP PERCENT: 1 %
MDC IDC STAT BRADY AP VS PERCENT: 64 %
MDC IDC STAT BRADY AS VP PERCENT: 1 %
MDC IDC STAT BRADY AS VS PERCENT: 34 %

## 2015-11-13 DIAGNOSIS — I10 Essential (primary) hypertension: Secondary | ICD-10-CM | POA: Diagnosis not present

## 2015-11-13 DIAGNOSIS — I2510 Atherosclerotic heart disease of native coronary artery without angina pectoris: Secondary | ICD-10-CM | POA: Diagnosis not present

## 2015-11-13 DIAGNOSIS — M80031D Age-related osteoporosis with current pathological fracture, right forearm, subsequent encounter for fracture with routine healing: Secondary | ICD-10-CM | POA: Diagnosis not present

## 2015-11-13 DIAGNOSIS — I482 Chronic atrial fibrillation: Secondary | ICD-10-CM | POA: Diagnosis not present

## 2015-11-20 DIAGNOSIS — I2510 Atherosclerotic heart disease of native coronary artery without angina pectoris: Secondary | ICD-10-CM | POA: Diagnosis not present

## 2015-11-20 DIAGNOSIS — M80031D Age-related osteoporosis with current pathological fracture, right forearm, subsequent encounter for fracture with routine healing: Secondary | ICD-10-CM | POA: Diagnosis not present

## 2015-11-20 DIAGNOSIS — I10 Essential (primary) hypertension: Secondary | ICD-10-CM | POA: Diagnosis not present

## 2015-11-20 DIAGNOSIS — I482 Chronic atrial fibrillation: Secondary | ICD-10-CM | POA: Diagnosis not present

## 2015-11-24 ENCOUNTER — Ambulatory Visit (INDEPENDENT_AMBULATORY_CARE_PROVIDER_SITE_OTHER): Payer: Medicare Other | Admitting: Cardiovascular Disease

## 2015-11-24 ENCOUNTER — Encounter: Payer: Self-pay | Admitting: Cardiovascular Disease

## 2015-11-24 VITALS — BP 128/70 | HR 77 | Ht 63.0 in | Wt 156.0 lb

## 2015-11-24 DIAGNOSIS — I495 Sick sinus syndrome: Secondary | ICD-10-CM | POA: Diagnosis not present

## 2015-11-24 DIAGNOSIS — Z79899 Other long term (current) drug therapy: Secondary | ICD-10-CM

## 2015-11-24 DIAGNOSIS — I2510 Atherosclerotic heart disease of native coronary artery without angina pectoris: Secondary | ICD-10-CM | POA: Diagnosis not present

## 2015-11-24 DIAGNOSIS — I480 Paroxysmal atrial fibrillation: Secondary | ICD-10-CM | POA: Diagnosis not present

## 2015-11-24 DIAGNOSIS — I10 Essential (primary) hypertension: Secondary | ICD-10-CM

## 2015-11-24 DIAGNOSIS — Z950 Presence of cardiac pacemaker: Secondary | ICD-10-CM

## 2015-11-24 NOTE — Progress Notes (Signed)
Patient ID: Melanie [name], female   DOB: 1926-01-13, 82 y.o.   MRN: [id number]      SUBJECTIVE: Melanie [name] has a history of medically managed CAD, HTN, paroxysmal atrial fibrillation, dyslipidemia, and sick sinus syndrome with a pacemaker. Her pacemaker was most recently evaluated on 11/10/15 and was found to be functioning normally. She denies chest pain, shortness of breath, lightheadedness, dizziness, and syncope.    Review of Systems: As per "subjective", otherwise negative.  Allergies  Allergen Reactions  . Ciprofloxacin Other (See Comments)    Unknown   . Hctz [Hydrochlorothiazide] Other (See Comments)    Unknown   . Lopid [Gemfibrozil] Other (See Comments)    Unknown      Current Outpatient Prescriptions  Medication Sig Dispense Refill  . amiodarone (PACERONE) 200 MG tablet Take 100 mg by mouth every morning.    Marland Kitchen amLODipine (NORVASC) 5 MG tablet Take 5 mg by mouth every morning.     . clonazePAM (KLONOPIN) 0.5 MG tablet Take 0.5 mg by mouth at bedtime. For restless leg syndrome    . ezetimibe (ZETIA) 10 MG tablet Take 10 mg by mouth daily.    . isosorbide mononitrate (IMDUR) 30 MG 24 hr tablet Take 15 mg by mouth every morning.     Marland Kitchen levothyroxine (SYNTHROID, LEVOTHROID) 88 MCG tablet Take 88 mcg by mouth daily before breakfast.    . omeprazole (PRILOSEC) 20 MG capsule Take 20 mg by mouth 2 (two) times daily before a meal.    . warfarin (COUMADIN) 2 MG tablet Take 2 mg by mouth See admin instructions. Take 1 tablet by mouth every day except Tuesdays and Fridays. Do not take any Warfarin on Tuesdays or Fridays.     No current facility-administered medications for this visit.    Past Medical History  Diagnosis Date  . Vertigo   . Paroxysmal atrial fibrillation (HCC)     on coumadin and amiodarone  . Sinus node dysfunction (HCC)     s/p Medtronic dual-chamber pacemaker  . Hypercalcemia   . Hypertension   . Dyslipidemia   . CAD (coronary artery disease)     Two  vessel, quiescent; 70% LAD aretery disease and well collateralized, 100% RCA by cardiac cath in 2004; NL LVF; low risk Cardioloite; EF 74%, 11/10  . Hypothyroidism     Past Surgical History  Procedure Laterality Date  . Pacemaker insertion  05/24/03    MDT Kappa  . Abdominal hysterectomy    . Fracture surgery    . Pacemaker generator change N/A 02/12/2013    Procedure: PACEMAKER GENERATOR CHANGE;  Surgeon: Deboraha Sprang, MD;  Location: [facility] CATH LAB;  Service: Cardiovascular;  Laterality: N/A;    Social History   Social History  . Marital Status: Single    Spouse Name: N/A  . Number of Children: N/A  . Years of Education: N/A   Occupational History  . Not on file.   Social History Main Topics  . Smoking status: Never Smoker   . Smokeless tobacco: Never Used  . Alcohol Use: No  . Drug Use: No  . Sexual Activity: Not on file   Other Topics Concern  . Not on file   Social History Narrative     Filed Vitals:   11/24/15 1308  BP: 128/70  Pulse: 77  Height: 5\' 3"  (1.6 m)  Weight: 156 lb (70.761 kg)  SpO2: 94%    PHYSICAL EXAM General: NAD HEENT: Normal. Neck: No JVD, no thyromegaly. Lungs: Clear  to auscultation bilaterally with normal respiratory effort. CV: Nondisplaced PMI.  Regular rate and rhythm, normal S1/S2, no S3/S4, no murmur. No pretibial or periankle edema.  No carotid bruit.   Abdomen: Soft, nontender, no distention.  Neurologic: Alert and oriented.  Psych: Normal affect. Skin: Normal. Musculoskeletal: No gross deformities.  ECG: Most recent ECG reviewed.      ASSESSMENT AND PLAN: 1. CAD: Symptomatically stable on current medical therapy which includes Imdur and amlodipine. No longer on statin.  2. Essential HTN: Well controlled on amlodipine. No changes.  3. Hyperlipidemia: No longer on statins.  4. Paroxysmal atrial fibrillation: Maintained on amiodarone and warfarin without difficulties. Normal thyroid function studies and LFT's in  03/2015.  5. Pacemaker: Followed by Dr. Rayann Heman. Normal device function on 11/10/15.  Dispo: f/u 1 year.   Kate Sable, M.D., F.A.C.C.

## 2015-11-24 NOTE — Patient Instructions (Signed)
Continue all current medications. Your physician wants you to follow up in:  1 year.  You will receive a reminder letter in the mail one-two months in advance.  If you don't receive a letter, please call our office to schedule the follow up appointment   

## 2015-11-28 DIAGNOSIS — I482 Chronic atrial fibrillation: Secondary | ICD-10-CM | POA: Diagnosis not present

## 2015-11-30 DIAGNOSIS — E782 Mixed hyperlipidemia: Secondary | ICD-10-CM | POA: Diagnosis not present

## 2015-11-30 DIAGNOSIS — I482 Chronic atrial fibrillation: Secondary | ICD-10-CM | POA: Diagnosis not present

## 2015-11-30 DIAGNOSIS — E039 Hypothyroidism, unspecified: Secondary | ICD-10-CM | POA: Diagnosis not present

## 2015-11-30 DIAGNOSIS — I10 Essential (primary) hypertension: Secondary | ICD-10-CM | POA: Diagnosis not present

## 2015-12-08 DIAGNOSIS — R21 Rash and other nonspecific skin eruption: Secondary | ICD-10-CM | POA: Diagnosis not present

## 2015-12-12 DIAGNOSIS — I482 Chronic atrial fibrillation: Secondary | ICD-10-CM | POA: Diagnosis not present

## 2015-12-23 DIAGNOSIS — R1030 Lower abdominal pain, unspecified: Secondary | ICD-10-CM | POA: Diagnosis not present

## 2015-12-30 ENCOUNTER — Telehealth: Payer: Self-pay | Admitting: *Deleted

## 2015-12-30 NOTE — Telephone Encounter (Signed)
LMOM to call back to device clinic regarding 01/02/16 appt for device clinic.

## 2016-01-02 ENCOUNTER — Encounter: Payer: Self-pay | Admitting: Internal Medicine

## 2016-01-02 ENCOUNTER — Ambulatory Visit (INDEPENDENT_AMBULATORY_CARE_PROVIDER_SITE_OTHER): Payer: Medicare Other | Admitting: *Deleted

## 2016-01-02 DIAGNOSIS — Z950 Presence of cardiac pacemaker: Secondary | ICD-10-CM

## 2016-01-02 LAB — CUP PACEART INCLINIC DEVICE CHECK
Battery Remaining Longevity: 135 mo
Battery Voltage: 2.79 V
Brady Statistic AP VS Percent: 64 %
Brady Statistic AS VS Percent: 34 %
Date Time Interrogation Session: 20170407143646
Implantable Lead Implant Date: 20040827
Implantable Lead Location: 753859
Implantable Lead Model: 4092
Lead Channel Pacing Threshold Amplitude: 0.5 V
Lead Channel Pacing Threshold Amplitude: 1.375 V
Lead Channel Pacing Threshold Pulse Width: 0.4 ms
Lead Channel Setting Pacing Amplitude: 2 V
Lead Channel Setting Pacing Pulse Width: 0.4 ms
Lead Channel Setting Sensing Sensitivity: 2.8 mV
MDC IDC LEAD IMPLANT DT: 20040827
MDC IDC LEAD LOCATION: 753860
MDC IDC MSMT BATTERY IMPEDANCE: 160 Ohm
MDC IDC MSMT LEADCHNL RA IMPEDANCE VALUE: 482 Ohm
MDC IDC MSMT LEADCHNL RA PACING THRESHOLD PULSEWIDTH: 0.4 ms
MDC IDC MSMT LEADCHNL RV IMPEDANCE VALUE: 963 Ohm
MDC IDC SET LEADCHNL RV PACING AMPLITUDE: 2.5 V
MDC IDC STAT BRADY AP VP PERCENT: 1 %
MDC IDC STAT BRADY AS VP PERCENT: 1 %

## 2016-01-02 NOTE — Progress Notes (Signed)
Pacemaker check in clinic- quick wand for episodes. Patient concerned about sustained heart rates in the 80s although largely asymptomatic (other than some leg weakness). Normal device function. Auto thresholds, sensing, impedances consistent with previous measurements. Device programmed to maximize longevity. 4 atrial high rates- longest 1:11- AT/AF +warfarin. No high ventricular rates noted. Device programmed at appropriate safety margins. Histogram distribution appropriate for patient activity level. Device programmed to optimize intrinsic conduction. Estimated longevity 9.5-13 years. Patient enrolled in remote follow-up and is aware of how to use monitor +wirex. ROV with JA/[place] 04/30/16 at 10:30.

## 2016-01-12 DIAGNOSIS — I482 Chronic atrial fibrillation: Secondary | ICD-10-CM | POA: Diagnosis not present

## 2016-01-26 ENCOUNTER — Other Ambulatory Visit: Payer: Self-pay | Admitting: Internal Medicine

## 2016-01-28 NOTE — Telephone Encounter (Signed)
Rx refill sent to pharmacy. 

## 2016-02-11 DIAGNOSIS — I482 Chronic atrial fibrillation: Secondary | ICD-10-CM | POA: Diagnosis not present

## 2016-02-25 DIAGNOSIS — I482 Chronic atrial fibrillation: Secondary | ICD-10-CM | POA: Diagnosis not present

## 2016-03-09 DIAGNOSIS — Z1212 Encounter for screening for malignant neoplasm of rectum: Secondary | ICD-10-CM | POA: Diagnosis not present

## 2016-03-09 DIAGNOSIS — R10811 Right upper quadrant abdominal tenderness: Secondary | ICD-10-CM | POA: Diagnosis not present

## 2016-03-10 ENCOUNTER — Other Ambulatory Visit (HOSPITAL_COMMUNITY): Payer: Self-pay | Admitting: Family Medicine

## 2016-03-10 DIAGNOSIS — R10811 Right upper quadrant abdominal tenderness: Secondary | ICD-10-CM

## 2016-03-16 DIAGNOSIS — R609 Edema, unspecified: Secondary | ICD-10-CM | POA: Diagnosis not present

## 2016-03-17 ENCOUNTER — Ambulatory Visit (HOSPITAL_COMMUNITY): Payer: Medicare Other

## 2016-03-17 ENCOUNTER — Ambulatory Visit (HOSPITAL_COMMUNITY)
Admission: RE | Admit: 2016-03-17 | Discharge: 2016-03-17 | Disposition: A | Discharge status: Home / Self Care | Payer: Medicare Other | Source: Ambulatory Visit | Attending: Family Medicine | Admitting: Family Medicine

## 2016-03-17 DIAGNOSIS — R10811 Right upper quadrant abdominal tenderness: Secondary | ICD-10-CM | POA: Insufficient documentation

## 2016-03-17 DIAGNOSIS — R161 Splenomegaly, not elsewhere classified: Secondary | ICD-10-CM | POA: Diagnosis not present

## 2016-03-17 DIAGNOSIS — R1011 Right upper quadrant pain: Secondary | ICD-10-CM | POA: Diagnosis not present

## 2016-03-24 DIAGNOSIS — I482 Chronic atrial fibrillation: Secondary | ICD-10-CM | POA: Diagnosis not present

## 2016-04-07 DIAGNOSIS — D225 Melanocytic nevi of trunk: Secondary | ICD-10-CM | POA: Diagnosis not present

## 2016-04-07 DIAGNOSIS — L821 Other seborrheic keratosis: Secondary | ICD-10-CM | POA: Diagnosis not present

## 2016-04-07 DIAGNOSIS — L820 Inflamed seborrheic keratosis: Secondary | ICD-10-CM | POA: Diagnosis not present

## 2016-04-07 DIAGNOSIS — X32XXXA Exposure to sunlight, initial encounter: Secondary | ICD-10-CM | POA: Diagnosis not present

## 2016-04-07 DIAGNOSIS — L570 Actinic keratosis: Secondary | ICD-10-CM | POA: Diagnosis not present

## 2016-04-23 DIAGNOSIS — I482 Chronic atrial fibrillation: Secondary | ICD-10-CM | POA: Diagnosis not present

## 2016-04-30 ENCOUNTER — Encounter: Payer: Self-pay | Admitting: Internal Medicine

## 2016-04-30 ENCOUNTER — Ambulatory Visit (INDEPENDENT_AMBULATORY_CARE_PROVIDER_SITE_OTHER): Payer: Medicare Other | Admitting: Internal Medicine

## 2016-04-30 VITALS — BP 120/75 | HR 83 | Ht 63.0 in | Wt 152.0 lb

## 2016-04-30 DIAGNOSIS — I480 Paroxysmal atrial fibrillation: Secondary | ICD-10-CM

## 2016-04-30 DIAGNOSIS — I495 Sick sinus syndrome: Secondary | ICD-10-CM | POA: Diagnosis not present

## 2016-04-30 DIAGNOSIS — Z7901 Long term (current) use of anticoagulants: Secondary | ICD-10-CM | POA: Diagnosis not present

## 2016-04-30 LAB — CUP PACEART INCLINIC DEVICE CHECK
Battery Impedance: 184 Ohm
Battery Remaining Longevity: 130 mo
Battery Voltage: 2.79 V
Brady Statistic AP VP Percent: 0 %
Brady Statistic AS VS Percent: 37 %
Date Time Interrogation Session: 20170804114011
Implantable Lead Implant Date: 20040827
Implantable Lead Location: 753859
Implantable Lead Location: 753860
Lead Channel Impedance Value: 504 Ohm
Lead Channel Pacing Threshold Amplitude: 0.5 V
Lead Channel Sensing Intrinsic Amplitude: 1.4 mV
Lead Channel Setting Pacing Amplitude: 2.5 V
Lead Channel Setting Pacing Pulse Width: 0.4 ms
MDC IDC LEAD IMPLANT DT: 20040827
MDC IDC MSMT LEADCHNL RA PACING THRESHOLD PULSEWIDTH: 0.4 ms
MDC IDC MSMT LEADCHNL RV IMPEDANCE VALUE: 986 Ohm
MDC IDC MSMT LEADCHNL RV PACING THRESHOLD AMPLITUDE: 0.75 V
MDC IDC MSMT LEADCHNL RV PACING THRESHOLD PULSEWIDTH: 0.4 ms
MDC IDC MSMT LEADCHNL RV SENSING INTR AMPL: 8 mV
MDC IDC SET LEADCHNL RA PACING AMPLITUDE: 2 V
MDC IDC SET LEADCHNL RV SENSING SENSITIVITY: 2.8 mV
MDC IDC STAT BRADY AP VS PERCENT: 63 %
MDC IDC STAT BRADY AS VP PERCENT: 0 %

## 2016-04-30 MED ORDER — AMLODIPINE BESYLATE 2.5 MG PO TABS: 2.5000 mg | ORAL_TABLET | Freq: Every day | ORAL | 3 refills | Status: DC

## 2016-04-30 NOTE — Progress Notes (Signed)
   PCP: Jeri Modena   Primary Cardiologist:  Dr Melburn Hake is a 82 y.o. female who presents today for routine electrophysiology followup.  Since her last visit, she has done well.  Her afib is well controlled.  Today, she denies symptoms of chest pain, shortness of breath,  lower extremity edema, dizziness, presyncope, or syncope.  The patient is otherwise without complaint today.   Past Medical History:  Diagnosis Date  . CAD (coronary artery disease)    Two vessel, quiescent; 70% LAD aretery disease and well collateralized, 100% RCA by cardiac cath in 2004; NL LVF; low risk Cardioloite; EF 74%, 11/10  . Dyslipidemia   . Hypercalcemia   . Hypertension   . Hypothyroidism   . Paroxysmal atrial fibrillation (HCC)    on coumadin and amiodarone  . Sinus node dysfunction (HCC)    s/p Medtronic dual-chamber pacemaker  . Vertigo    Past Surgical History:  Procedure Laterality Date  . ABDOMINAL HYSTERECTOMY    . FRACTURE SURGERY    . PACEMAKER GENERATOR CHANGE N/A 02/12/2013   Procedure: PACEMAKER GENERATOR CHANGE;  Surgeon: Deboraha Sprang, MD;  Location: [facility] CATH LAB;  Service: Cardiovascular;  Laterality: N/A;  . PACEMAKER INSERTION  05/24/03   MDT Kappa    Current Outpatient Prescriptions  Medication Sig Dispense Refill  . amiodarone (PACERONE) 200 MG tablet TAKE 1/2 TABLET BY MOUTH EVERY DAY 15 tablet 3  . amLODipine (NORVASC) 5 MG tablet Take 5 mg by mouth every morning.     . clonazePAM (KLONOPIN) 0.5 MG tablet Take 0.5 mg by mouth at bedtime. For restless leg syndrome    . ezetimibe (ZETIA) 10 MG tablet Take 10 mg by mouth daily.    . isosorbide mononitrate (IMDUR) 30 MG 24 hr tablet Take 15 mg by mouth every morning.     Marland Kitchen levothyroxine (SYNTHROID, LEVOTHROID) 88 MCG tablet Take 88 mcg by mouth daily before breakfast.    . warfarin (COUMADIN) 2 MG tablet Take 2 mg by mouth. TAKE AS DIRECTED BY PMD     No current facility-administered medications for this  visit.     Physical Exam: Vitals:   04/30/16 1100  BP: 120/75  Pulse: 83  SpO2: 97%  Weight: 152 lb (68.9 kg)  Height: 5\' 3"  (1.6 m)    GEN- The patient is elderly appearing, alert and oriented x 3 today.   Head- normocephalic, atraumatic Eyes-  Sclera clear, conjunctiva pink Ears- hearing intact Oropharynx- clear Lungs- Clear to ausculation bilaterally, normal work of breathing Chest- pacemaker pocket is well healed Heart- Regular rate and rhythm, no murmurs, rubs or gallops, PMI not laterally displaced GI- soft, NT, ND, + BS Extremities- no clubbing, cyanosis, or edema  Pacemaker interrogation- reviewed in detail today,  See PACEART report  Assessment and Plan:  1. Sick sinus syndrome Normal pacemaker function See Pace Art report No changes today  2. afib  Well controlled with amiodarone She is presently on coumadin chads2vasc score is 5  Continue long term anticoagulation Check LFTs//bmet/TFTs today  3. HTN Stable She wants to reduce her medicines I have therefore reduced norvasc to 2.5mg  daily today.  She is instructed to follow her BP closely and return to 5mg  daily if elevated.   carelink every 3 months Return in 1 year Return to see Dr Bronson Ing as scheduled  Thompson Grayer MD, [facility] 04/30/2016 11:27 AM

## 2016-04-30 NOTE — Patient Instructions (Addendum)
Medication Instructions:   Your physician has recommended you make the following change in your medication.  Decrease amlodipine to 2.5 mg daily. You may break your 5 mg tablet in half daily until they are finished.  Continue all other medications the same.  Labwork:  Your physician recommends that you have lab work to check your BMET, Liver function and TSH. Please take the lab orders with you to the lab.  Testing/Procedures: NONE  Follow-Up:  Your next device check from home is on 08/02/16. Your physician recommends that you schedule a follow-up appointment in: 1 year with Dr. Rayann Heman. Please schedule this appointment today before leaving the office.   Any Other Special Instructions Will Be Listed Below (If Applicable).  Please monitor your blood pressures at home. If your blood pressure increase after the lower dose of amlodipine, please restart amlodipine 5 mg. Contact our office if this happens so we can update your prescription.  If you need a refill on your cardiac medications before your next appointment, please call your pharmacy.

## 2016-05-06 ENCOUNTER — Telehealth: Payer: Self-pay | Admitting: *Deleted

## 2016-05-06 ENCOUNTER — Encounter: Payer: Self-pay | Admitting: *Deleted

## 2016-05-07 DIAGNOSIS — I482 Chronic atrial fibrillation: Secondary | ICD-10-CM | POA: Diagnosis not present

## 2016-05-10 ENCOUNTER — Encounter: Payer: Self-pay | Admitting: Internal Medicine

## 2016-05-10 NOTE — Telephone Encounter (Signed)
Spoke with patient on 05/06/16 and she said lab work was completed at her PCP's office. Request sent to their office.

## 2016-06-07 DIAGNOSIS — I482 Chronic atrial fibrillation: Secondary | ICD-10-CM | POA: Diagnosis not present

## 2016-06-15 ENCOUNTER — Other Ambulatory Visit: Payer: Self-pay | Admitting: Internal Medicine

## 2016-06-16 DIAGNOSIS — E039 Hypothyroidism, unspecified: Secondary | ICD-10-CM | POA: Diagnosis not present

## 2016-07-07 DIAGNOSIS — I482 Chronic atrial fibrillation: Secondary | ICD-10-CM | POA: Diagnosis not present

## 2016-07-07 DIAGNOSIS — L03211 Cellulitis of face: Secondary | ICD-10-CM | POA: Diagnosis not present

## 2016-07-09 DIAGNOSIS — I482 Chronic atrial fibrillation: Secondary | ICD-10-CM | POA: Diagnosis not present

## 2016-07-14 DIAGNOSIS — I482 Chronic atrial fibrillation: Secondary | ICD-10-CM | POA: Diagnosis not present

## 2016-07-24 DIAGNOSIS — L251 Unspecified contact dermatitis due to drugs in contact with skin: Secondary | ICD-10-CM | POA: Diagnosis not present

## 2016-07-28 DIAGNOSIS — I482 Chronic atrial fibrillation: Secondary | ICD-10-CM | POA: Diagnosis not present

## 2016-08-02 ENCOUNTER — Telehealth: Payer: Self-pay | Admitting: Cardiology

## 2016-08-02 ENCOUNTER — Ambulatory Visit (INDEPENDENT_AMBULATORY_CARE_PROVIDER_SITE_OTHER): Payer: Medicare Other | Admitting: *Deleted

## 2016-08-02 DIAGNOSIS — I495 Sick sinus syndrome: Secondary | ICD-10-CM

## 2016-08-02 NOTE — Telephone Encounter (Signed)
Spoke with pt and reminded pt of remote transmission that is due today. Pt verbalized understanding.   

## 2016-08-03 NOTE — Progress Notes (Signed)
Remote pacemaker transmission.   

## 2016-08-04 ENCOUNTER — Encounter: Payer: Self-pay | Admitting: Cardiology

## 2016-08-05 DIAGNOSIS — I482 Chronic atrial fibrillation: Secondary | ICD-10-CM | POA: Diagnosis not present

## 2016-08-06 LAB — CUP PACEART REMOTE DEVICE CHECK
Battery Impedance: 184 Ohm
Battery Remaining Longevity: 131 mo
Battery Voltage: 2.79 V
Brady Statistic AS VS Percent: 40 %
Date Time Interrogation Session: 20171106194315
Implantable Lead Implant Date: 20040827
Implantable Lead Location: 753860
Implantable Lead Model: 4092
Implantable Pulse Generator Implant Date: 20140519
Lead Channel Impedance Value: 1063 Ohm
Lead Channel Setting Pacing Amplitude: 2 V
Lead Channel Setting Pacing Amplitude: 2.5 V
Lead Channel Setting Pacing Pulse Width: 0.4 ms
Lead Channel Setting Sensing Sensitivity: 2.8 mV
MDC IDC LEAD IMPLANT DT: 20040827
MDC IDC LEAD LOCATION: 753859
MDC IDC MSMT LEADCHNL RA IMPEDANCE VALUE: 497 Ohm
MDC IDC MSMT LEADCHNL RA PACING THRESHOLD AMPLITUDE: 0.375 V
MDC IDC MSMT LEADCHNL RA PACING THRESHOLD PULSEWIDTH: 0.4 ms
MDC IDC MSMT LEADCHNL RV PACING THRESHOLD AMPLITUDE: 1.5 V
MDC IDC MSMT LEADCHNL RV PACING THRESHOLD PULSEWIDTH: 0.4 ms
MDC IDC STAT BRADY AP VP PERCENT: 0 %
MDC IDC STAT BRADY AP VS PERCENT: 59 %
MDC IDC STAT BRADY AS VP PERCENT: 0 %

## 2016-09-02 DIAGNOSIS — I482 Chronic atrial fibrillation: Secondary | ICD-10-CM | POA: Diagnosis not present

## 2016-10-05 DIAGNOSIS — I482 Chronic atrial fibrillation: Secondary | ICD-10-CM | POA: Diagnosis not present

## 2016-10-13 ENCOUNTER — Other Ambulatory Visit: Payer: Self-pay | Admitting: Cardiovascular Disease

## 2016-10-18 DIAGNOSIS — M810 Age-related osteoporosis without current pathological fracture: Secondary | ICD-10-CM | POA: Diagnosis not present

## 2016-10-18 DIAGNOSIS — E782 Mixed hyperlipidemia: Secondary | ICD-10-CM | POA: Diagnosis not present

## 2016-10-18 DIAGNOSIS — G2581 Restless legs syndrome: Secondary | ICD-10-CM | POA: Diagnosis not present

## 2016-10-18 DIAGNOSIS — E039 Hypothyroidism, unspecified: Secondary | ICD-10-CM | POA: Diagnosis not present

## 2016-10-18 DIAGNOSIS — K219 Gastro-esophageal reflux disease without esophagitis: Secondary | ICD-10-CM | POA: Diagnosis not present

## 2016-10-21 DIAGNOSIS — G2581 Restless legs syndrome: Secondary | ICD-10-CM | POA: Diagnosis not present

## 2016-10-21 DIAGNOSIS — E039 Hypothyroidism, unspecified: Secondary | ICD-10-CM | POA: Diagnosis not present

## 2016-10-21 DIAGNOSIS — E782 Mixed hyperlipidemia: Secondary | ICD-10-CM | POA: Diagnosis not present

## 2016-10-21 DIAGNOSIS — I482 Chronic atrial fibrillation: Secondary | ICD-10-CM | POA: Diagnosis not present

## 2016-10-21 DIAGNOSIS — I10 Essential (primary) hypertension: Secondary | ICD-10-CM | POA: Diagnosis not present

## 2016-10-21 DIAGNOSIS — R300 Dysuria: Secondary | ICD-10-CM | POA: Diagnosis not present

## 2016-11-01 ENCOUNTER — Encounter: Payer: Medicare Other | Admitting: *Deleted

## 2016-11-01 ENCOUNTER — Telehealth: Payer: Self-pay | Admitting: Cardiology

## 2016-11-01 DIAGNOSIS — I10 Essential (primary) hypertension: Secondary | ICD-10-CM | POA: Diagnosis not present

## 2016-11-01 DIAGNOSIS — I480 Paroxysmal atrial fibrillation: Secondary | ICD-10-CM | POA: Diagnosis not present

## 2016-11-01 DIAGNOSIS — R51 Headache: Secondary | ICD-10-CM | POA: Diagnosis not present

## 2016-11-01 DIAGNOSIS — G2581 Restless legs syndrome: Secondary | ICD-10-CM | POA: Diagnosis not present

## 2016-11-01 NOTE — Telephone Encounter (Signed)
Spoke with pt and reminded pt of remote transmission that is due today. Pt verbalized understanding.   

## 2016-11-05 ENCOUNTER — Encounter: Payer: Self-pay | Admitting: Cardiology

## 2016-11-05 DIAGNOSIS — I482 Chronic atrial fibrillation: Secondary | ICD-10-CM | POA: Diagnosis not present

## 2016-11-11 ENCOUNTER — Ambulatory Visit (INDEPENDENT_AMBULATORY_CARE_PROVIDER_SITE_OTHER): Payer: Medicare Other | Admitting: *Deleted

## 2016-11-11 DIAGNOSIS — I495 Sick sinus syndrome: Secondary | ICD-10-CM | POA: Diagnosis not present

## 2016-11-15 NOTE — Progress Notes (Signed)
Remote pacemaker transmission.   

## 2016-11-16 ENCOUNTER — Encounter: Payer: Self-pay | Admitting: Cardiology

## 2016-11-19 LAB — CUP PACEART REMOTE DEVICE CHECK
Battery Remaining Longevity: 130 mo
Brady Statistic AS VP Percent: 0 %
Date Time Interrogation Session: 20180215193803
Implantable Lead Implant Date: 20040827
Implantable Lead Location: 753859
Implantable Lead Location: 753860
Implantable Pulse Generator Implant Date: 20140519
Lead Channel Pacing Threshold Amplitude: 0.5 V
Lead Channel Pacing Threshold Amplitude: 1.75 V
Lead Channel Pacing Threshold Pulse Width: 0.4 ms
Lead Channel Setting Pacing Amplitude: 2 V
Lead Channel Setting Sensing Sensitivity: 2.8 mV
MDC IDC LEAD IMPLANT DT: 20040827
MDC IDC MSMT BATTERY IMPEDANCE: 184 Ohm
MDC IDC MSMT BATTERY VOLTAGE: 2.8 V
MDC IDC MSMT LEADCHNL RA IMPEDANCE VALUE: 510 Ohm
MDC IDC MSMT LEADCHNL RA PACING THRESHOLD PULSEWIDTH: 0.4 ms
MDC IDC MSMT LEADCHNL RV IMPEDANCE VALUE: 1060 Ohm
MDC IDC SET LEADCHNL RV PACING AMPLITUDE: 2.5 V
MDC IDC SET LEADCHNL RV PACING PULSEWIDTH: 0.4 ms
MDC IDC STAT BRADY AP VP PERCENT: 0 %
MDC IDC STAT BRADY AP VS PERCENT: 61 %
MDC IDC STAT BRADY AS VS PERCENT: 38 %

## 2016-11-23 ENCOUNTER — Ambulatory Visit: Payer: Medicare Other | Admitting: Cardiovascular Disease

## 2016-11-25 ENCOUNTER — Encounter: Payer: Self-pay | Admitting: *Deleted

## 2016-11-25 ENCOUNTER — Ambulatory Visit (INDEPENDENT_AMBULATORY_CARE_PROVIDER_SITE_OTHER): Payer: Medicare Other | Admitting: Adult Health

## 2016-11-25 ENCOUNTER — Encounter: Payer: Self-pay | Admitting: Adult Health

## 2016-11-25 VITALS — BP 144/78 | HR 75 | Ht 63.0 in | Wt 145.0 lb

## 2016-11-25 DIAGNOSIS — I4891 Unspecified atrial fibrillation: Secondary | ICD-10-CM

## 2016-11-25 DIAGNOSIS — I5043 Acute on chronic combined systolic (congestive) and diastolic (congestive) heart failure: Secondary | ICD-10-CM | POA: Diagnosis not present

## 2016-11-25 MED ORDER — AMLODIPINE BESYLATE 5 MG PO TABS: 5.0000 mg | ORAL_TABLET | Freq: Every day | ORAL | 3 refills | Status: DC

## 2016-11-25 NOTE — Progress Notes (Signed)
Name: Melanie [name]    DOB: June 04, 1926  Age: 83 y.o.  MR#: [id number]       PCP:  Jeri Modena      Insurance: Payor: [employer] MEDICARE / Plan: UHC MEDICARE / Product Type: *No Product type* /   CC:   No chief complaint on file.   VS Vitals:   11/25/16 1356  BP: (!) 144/78  Pulse: 75  SpO2: 95%  Weight: 145 lb (65.8 kg)  Height: 5\' 3"  (1.6 m)    Weights Current Weight  11/25/16 145 lb (65.8 kg)  04/30/16 152 lb (68.9 kg)  11/24/15 156 lb (70.8 kg)    Blood Pressure  BP Readings from Last 3 Encounters:  11/25/16 (!) 144/78  04/30/16 120/75  11/24/15 128/70     Admit date:  (Not on file) Last encounter with RMR:  Visit date not found   Allergy Ciprofloxacin; Hctz [hydrochlorothiazide]; and Lopid [gemfibrozil]  Current Outpatient Prescriptions  Medication Sig Dispense Refill  . amiodarone (PACERONE) 200 MG tablet TAKE 1/2 OF A TABLET BY MOUTH EVERY DAY 15 tablet 3  . clonazePAM (KLONOPIN) 0.5 MG tablet Take 0.5 mg by mouth at bedtime. For restless leg syndrome    . ezetimibe (ZETIA) 10 MG tablet Take 10 mg by mouth daily.    . isosorbide mononitrate (IMDUR) 30 MG 24 hr tablet Take 15 mg by mouth every morning.     Marland Kitchen levothyroxine (SYNTHROID, LEVOTHROID) 88 MCG tablet Take 88 mcg by mouth daily before breakfast.    . warfarin (COUMADIN) 2 MG tablet Take 2 mg by mouth. TAKE AS DIRECTED BY PMD    . amLODipine (NORVASC) 2.5 MG tablet Take 1 tablet (2.5 mg total) by mouth daily. 90 tablet 3   No current facility-administered medications for this visit.     Discontinued Meds:   There are no discontinued medications.  Patient Active Problem List   Diagnosis Date Noted  . Femur fracture, right ([place]) 06/11/2012  . Anemia 08/25/2011  . Long term current use of anticoagulant 12/12/2010  . HYPOTHYROIDISM 08/12/2009  . HYPERCALCEMIA 08/12/2009  . Sinoatrial node dysfunction ([place]) 08/12/2009  . DYSPNEA ON EXERTION 08/12/2009  . PACEMAKER-Medtronic 08/12/2009   . HYPERLIPIDEMIA-MIXED 07/11/2008  . HYPERTENSION, BENIGN 07/11/2008  . CAD, NATIVE VESSEL 07/11/2008  . ATRIAL FIBRILLATION, PAROXYSMAL 07/11/2008    LABS    Component Value Date/Time   NA 139 05/10/2015 2147   NA 140 08/10/2014 1644   NA 139 07/19/2014 1805   K 4.4 05/10/2015 2147   K 4.0 08/10/2014 1644   K 4.5 07/19/2014 1805   CL 107 05/10/2015 2147   CL 104 08/10/2014 1644   CL 99 07/19/2014 1805   CO2 26 05/10/2015 2147   CO2 24 08/10/2014 1644   CO2 28 07/19/2014 1805   GLUCOSE 119 (H) 05/10/2015 2147   GLUCOSE 124 (H) 08/10/2014 1644   GLUCOSE 117 (H) 07/19/2014 1805   BUN 25 (H) 05/10/2015 2147   BUN 15 08/10/2014 1644   BUN 26 (H) 07/19/2014 1805   CREATININE 1.26 (H) 05/10/2015 2147   CREATININE 1.06 08/10/2014 1644   CREATININE 1.22 (H) 07/19/2014 1805   CALCIUM 10.2 05/10/2015 2147   CALCIUM 9.7 08/10/2014 1644   CALCIUM 10.4 07/19/2014 1805   GFRNONAA 37 (L) 05/10/2015 2147   GFRNONAA 46 (L) 08/10/2014 1644   GFRNONAA 39 (L) 07/19/2014 1805   GFRAA 43 (L) 05/10/2015 2147   GFRAA 54 (L) 08/10/2014 1644   GFRAA 45 (L) 07/19/2014  1805   CMP     Component Value Date/Time   NA 139 05/10/2015 2147   K 4.4 05/10/2015 2147   CL 107 05/10/2015 2147   CO2 26 05/10/2015 2147   GLUCOSE 119 (H) 05/10/2015 2147   BUN 25 (H) 05/10/2015 2147   CREATININE 1.26 (H) 05/10/2015 2147   CALCIUM 10.2 05/10/2015 2147   PROT 6.8 08/10/2014 1644   ALBUMIN 3.2 (L) 08/10/2014 1644   AST 24 08/10/2014 1644   ALT 18 08/10/2014 1644   ALKPHOS 89 08/10/2014 1644   BILITOT 0.2 (L) 08/10/2014 1644   GFRNONAA 37 (L) 05/10/2015 2147   GFRAA 43 (L) 05/10/2015 2147       Component Value Date/Time   WBC 6.2 05/10/2015 2147   WBC 6.1 08/10/2014 1644   WBC 10.9 (H) 07/19/2014 1805   HGB 12.5 05/10/2015 2147   HGB 11.8 (L) 08/10/2014 1644   HGB 12.8 07/19/2014 1805   HCT 38.6 05/10/2015 2147   HCT 35.9 (L) 08/10/2014 1644   HCT 38.3 07/19/2014 1805   MCV 92.3 05/10/2015  2147   MCV 91.6 08/10/2014 1644   MCV 91.2 07/19/2014 1805    Lipid Panel  No results found for: CHOL, TRIG, HDL, CHOLHDL, VLDL, LDLCALC, LDLDIRECT  ABG No results found for: PHART, PCO2ART, PO2ART, HCO3, TCO2, ACIDBASEDEF, O2SAT   Lab Results  Component Value Date   TSH 0.298 (L) 06/15/2012   BNP (last 3 results) No results for input(s): BNP in the last 8760 hours.  ProBNP (last 3 results) No results for input(s): PROBNP in the last 8760 hours.  Cardiac Panel (last 3 results) No results for input(s): CKTOTAL, CKMB, TROPONINI, RELINDX in the last 72 hours.  Iron/TIBC/Ferritin/ %Sat No results found for: IRON, TIBC, FERRITIN, IRONPCTSAT   EKG Orders placed or performed in visit on 11/25/16  . EKG 12-Lead     Prior Assessment and Plan Problem List as of 11/25/2016 Reviewed: 11/24/2015  1:13 PM by Kate Sable, MD     Cardiovascular and Mediastinum   HYPERTENSION, BENIGN   Last Assessment & Plan 01/05/2012 Office Visit Written 01/05/2012 11:22 AM by Thompson Grayer, MD    Above goal today She is reluctant to try new medicines I will therefore increase norvasc to 10mg  daily today  Salt restriction encouraged If BP remains elevated, then we should add a diuretic in the future.      CAD, NATIVE VESSEL   Last Assessment & Plan 08/25/2011 Office Visit Written 08/25/2011  3:05 PM by Donney Dice, PA    Quiescent on current medication regimen. Low risk Lexiscan Cardiolite; EF 74%, 11/10. No further workup currently indicated.      ATRIAL FIBRILLATION, PAROXYSMAL   Last Assessment & Plan 01/05/2012 Office Visit Written 01/05/2012 11:21 AM by Thompson Grayer, MD    Controlled Continue coumadin  She will ask Dr Woody Seller to follow LFTs/TFTs twice per year      Sinoatrial node dysfunction [facility])   Last Assessment & Plan 01/05/2012 Office Visit Written 01/05/2012 11:21 AM by Thompson Grayer, MD    Normal pacemaker function See Claudia Desanctis Art report No changes today  Her device is approaching  ERI.  She will need TTM in 3 months, then 6 month follow-up with device clinic.  I suspect that she may require more frequent TTMS at that point depending on estimated longevity.        Endocrine   HYPOTHYROIDISM     Musculoskeletal and Integument   Femur fracture, right ([place])  Other   HYPERLIPIDEMIA-MIXED   Last Assessment & Plan 08/25/2011 Office Visit Written 08/25/2011  3:06 PM by Donney Dice, PA    Very well-controlled on current medication regimen; LDL 53, 4/12.      HYPERCALCEMIA   DYSPNEA ON EXERTION   PACEMAKER-Medtronic   Last Assessment & Plan 08/25/2011 Office Visit Written 08/25/2011  3:05 PM by Donney Dice, PA    Followup with Dr. Thompson Grayer, as scheduled. Recent scheduled OV, 9/12, notable for normal pacer function, and indication that battery was approaching ERI. Scheduled return in 6 months.      Long term current use of anticoagulant   Anemia   Last Assessment & Plan 08/25/2011 Office Visit Written 08/25/2011  3:08 PM by Donney Dice, PA    Followup by Dr. Woody Seller. Will discontinue low-dose aspirin, given that patient is on chronic Coumadin anticoagulation.          Imaging: No results found.

## 2016-11-25 NOTE — Progress Notes (Addendum)
Cardiology Office Note   Date:  11/25/2016   ID:  Melanie [name], DOB 1926/05/04, MRN [id number]  PCP:  Jeri Modena  Cardiologist Koneswaran: seen by  Jory Sims, NP   No chief complaint on file.     History of Present Illness: Melanie [name] is a 83 y.o. female who presents for ongoing assessment and management of coronary artery disease, hypertension, paroxysmal atrial fibrillation, dyslipidemia, sick sinus syndrome with a pacemaker followed by Dr. Rayann Heman. Pacemaker was most recently evaluated in August 2017, in person, and remotely on 11/11/2016. The patient was last seen in the office for cardiology evaluation with Dr. Bronson Ing on 11/24/2015.Marland Kitchen At the time of that office visit the patient was stable from a cardiac standpoint, she was no longer on statin therapy for hyperlipidemia, paroxysmal atrial fibrillation was maintained on amiodarone and Coumadin without difficulties.  She states that she is having more palpitations and her BP is getting higher over the last few months. She worries a lot about her BP. She states that she has had lab work from [facility] with in the last month which showed abnormalities in thyroid function. She is seeing an endocrinologist on March 15th. No chest pain or dyspnea.     Past Medical History:  Diagnosis Date  . CAD (coronary artery disease)    Two vessel, quiescent; 70% LAD aretery disease and well collateralized, 100% RCA by cardiac cath in 2004; NL LVF; low risk Cardioloite; EF 74%, 11/10  . Dyslipidemia   . Hypercalcemia   . Hypertension   . Hypothyroidism   . Paroxysmal atrial fibrillation (HCC)    on coumadin and amiodarone  . Sinus node dysfunction (HCC)    s/p Medtronic dual-chamber pacemaker  . Vertigo     Past Surgical History:  Procedure Laterality Date  . ABDOMINAL HYSTERECTOMY    . FRACTURE SURGERY    . PACEMAKER GENERATOR CHANGE N/A 02/12/2013   Procedure: PACEMAKER GENERATOR CHANGE;  Surgeon: Deboraha Sprang, MD;   Location: [facility] CATH LAB;  Service: Cardiovascular;  Laterality: N/A;  . PACEMAKER INSERTION  05/24/03   MDT Kappa     Current Outpatient Prescriptions  Medication Sig Dispense Refill  . amiodarone (PACERONE) 200 MG tablet TAKE 1/2 OF A TABLET BY MOUTH EVERY DAY 15 tablet 3  . clonazePAM (KLONOPIN) 0.5 MG tablet Take 0.5 mg by mouth at bedtime. For restless leg syndrome    . ezetimibe (ZETIA) 10 MG tablet Take 10 mg by mouth daily.    . isosorbide mononitrate (IMDUR) 30 MG 24 hr tablet Take 15 mg by mouth every morning.     Marland Kitchen levothyroxine (SYNTHROID, LEVOTHROID) 88 MCG tablet Take 88 mcg by mouth daily before breakfast.    . warfarin (COUMADIN) 2 MG tablet Take 2 mg by mouth. TAKE AS DIRECTED BY PMD    . amLODipine (NORVASC) 5 MG tablet Take 1 tablet (5 mg total) by mouth daily. 90 tablet 3   No current facility-administered medications for this visit.     Allergies:   Ciprofloxacin; Hctz [hydrochlorothiazide]; and Lopid [gemfibrozil]    Social History:  The patient  reports that she has never smoked. She has never used smokeless tobacco. She reports that she does not drink alcohol or use drugs.   Family History:  The patient's family history is not on file.    ROS: All other systems are reviewed and negative. Unless otherwise mentioned in H&P    PHYSICAL EXAM: VS:  BP (!) 144/78   Pulse 75  Ht 5\' 3"  (1.6 m)   Wt 145 lb (65.8 kg)   SpO2 95%   BMI 25.69 kg/m  , BMI Body mass index is 25.69 kg/m. GEN: Well nourished, well developed, in no acute distress  HEENT: normal  Neck: no JVD, carotid bruits, or masses Cardiac: IRRR; no murmurs, rubs, or gallops,no edema  Respiratory:  Clear to auscultation bilaterally, normal work of breathing GI: soft, nontender, nondistended, + BS MS: no deformity or atrophy  Skin: warm and dry, no rash Neuro:  Strength and sensation are intact Psych: euthymic mood, full affect   [id number] with underlying atrial fib.86 bpm.     Recent  Labs: No results found for requested labs within last 8760 hours.    Lipid Panel No results found for: CHOL, TRIG, HDL, CHOLHDL, VLDL, LDLCALC, LDLDIRECT    Wt Readings from Last 3 Encounters:  11/25/16 145 lb (65.8 kg)  04/30/16 152 lb (68.9 kg)  11/24/15 156 lb (70.8 kg)      Other studies Reviewed: Requesting labs  ASSESSMENT AND PLAN:  1.  Atrial fib: HR is slightly elevated today. She is on amiodarone at 100 mg daily. She apparently has recent labs which demonstrated abnormalities in TSH. I will not adjust her amiodarone until I have copies of the labs to verify. She denies bleeding or hemoptysis on coumadin, with INR checks by [facility].   2. Hypertension: BP is elevated today. She states that it has been for the last few months. I will increase the amlodipine to 5 mg daily. She is to check her BP daily and call us to let us know her BP levels. She is advised that if her BP becomes too low, she will need to go back to the 2.5 mg dose. Will see her in 3 months for follow up.   3. Pacemaker in situ: Follow up per protocol with Dr. Rayann Heman and remote checks.    Current medicines are reviewed at length with the patient today.    Labs/ tests ordered today include:   Orders Placed This Encounter  Procedures  . EKG 12-Lead   I have reviewed labs from PCP. TSH is normal 2.920. PTH is elevated 100. Calcium is elevated at 10.8. Na: 144, Potassium 4.5, chloride 105, CO2 24, Creatinine 1.18, BUN 18.   LFTS WNL. CBC essentially normal.   Disposition:   FU with 3 months  Signed, Jory Sims, NP  11/25/2016 2:26 PM    [facility] [address], [place], [place] [address] Phone: [phone]; Fax: [phone]

## 2016-11-25 NOTE — Patient Instructions (Addendum)
Your physician wants you to follow-up in: 6 months with Dr Virgina Jock will receive a reminder letter in the mail two months in advance. If you don't receive a letter, please call our office to schedule the follow-up appointment.   INCREASE Amlodipine to 5 mg daily   Keep BP log and call us next week with your readings      Thank you for choosing [facility] !

## 2016-11-26 ENCOUNTER — Telehealth: Payer: Self-pay

## 2016-11-26 NOTE — Telephone Encounter (Signed)
On med list for the office visit she was listed as taking 2.5 mg. Leave her on the 5 mg tablet for now until I review her labs. Will need to keep track of BP at home. Call for elevations.

## 2016-11-26 NOTE — Telephone Encounter (Signed)
Pt agreed to stay 5 mg with amlodipine and check BP daily and call for any high readings.I told her we would call her with lab results

## 2016-11-26 NOTE — Telephone Encounter (Signed)
Pt called with questions about her medication. I tried to clarify it for the pt. But she is confused. Will try to get daughter's number and contact her.

## 2016-11-26 NOTE — Telephone Encounter (Signed)
Pt called stating she is already taking amlodipine 5 mg, but was advised yesterday to increase it to 5mg . I called pharmacy and they stated she has only gotten the 5 mg filled in the past and never the 2.5 mg.  Please advise.

## 2016-12-03 DIAGNOSIS — I482 Chronic atrial fibrillation: Secondary | ICD-10-CM | POA: Diagnosis not present

## 2016-12-09 ENCOUNTER — Ambulatory Visit (INDEPENDENT_AMBULATORY_CARE_PROVIDER_SITE_OTHER): Payer: Medicare Other | Admitting: "Endocrinology

## 2016-12-09 ENCOUNTER — Encounter: Payer: Self-pay | Admitting: "Endocrinology

## 2016-12-09 DIAGNOSIS — E038 Other specified hypothyroidism: Secondary | ICD-10-CM | POA: Diagnosis not present

## 2016-12-09 LAB — COMPREHENSIVE METABOLIC PANEL
ALBUMIN: 4.1 g/dL (ref 3.6–5.1)
ALK PHOS: 102 U/L (ref 33–130)
ALT: 20 U/L (ref 6–29)
AST: 27 U/L (ref 10–35)
BILIRUBIN TOTAL: 0.4 mg/dL (ref 0.2–1.2)
BUN: 16 mg/dL (ref 7–25)
CALCIUM: 10.6 mg/dL — AB (ref 8.6–10.4)
CO2: 23 mmol/L (ref 20–31)
Chloride: 107 mmol/L (ref 98–110)
Creat: 1.07 mg/dL — ABNORMAL HIGH (ref 0.60–0.88)
GLUCOSE: 93 mg/dL (ref 65–99)
POTASSIUM: 4.2 mmol/L (ref 3.5–5.3)
Sodium: 142 mmol/L (ref 135–146)
TOTAL PROTEIN: 7.1 g/dL (ref 6.1–8.1)

## 2016-12-09 LAB — MAGNESIUM: MAGNESIUM: 2 mg/dL (ref 1.5–2.5)

## 2016-12-09 LAB — T4, FREE: FREE T4: 1.3 ng/dL (ref 0.8–1.8)

## 2016-12-09 LAB — TSH: TSH: 2.6 mIU/L

## 2016-12-09 LAB — PHOSPHORUS: Phosphorus: 2.6 mg/dL (ref 2.1–4.3)

## 2016-12-09 NOTE — Progress Notes (Signed)
Subjective:    Patient ID: Melanie [name], female    DOB: Feb 20, 1926, PCP Tawni Carnes, PA-C   Past Medical History:  Diagnosis Date  . CAD (coronary artery disease)    Two vessel, quiescent; 70% LAD aretery disease and well collateralized, 100% RCA by cardiac cath in 2004; NL LVF; low risk Cardioloite; EF 74%, 11/10  . Dyslipidemia   . Hypercalcemia   . Hypertension   . Hypothyroidism   . Paroxysmal atrial fibrillation (HCC)    on coumadin and amiodarone  . Sinus node dysfunction (HCC)    s/p Medtronic dual-chamber pacemaker  . Vertigo    Past Surgical History:  Procedure Laterality Date  . ABDOMINAL HYSTERECTOMY    . FRACTURE SURGERY    . PACEMAKER GENERATOR CHANGE N/A 02/12/2013   Procedure: PACEMAKER GENERATOR CHANGE;  Surgeon: Deboraha Sprang, MD;  Location: [facility] CATH LAB;  Service: Cardiovascular;  Laterality: N/A;  . PACEMAKER INSERTION  05/24/03   MDT [facility]   Social History   Social History  . Marital status: Single    Spouse name: N/A  . Number of children: N/A  . Years of education: N/A   Social History Main Topics  . Smoking status: Never Smoker  . Smokeless tobacco: Never Used  . Alcohol use No  . Drug use: No  . Sexual activity: Not Asked   Other Topics Concern  . None   Social History Narrative  . None   Outpatient Encounter Prescriptions as of 12/09/2016  Medication Sig  . amiodarone (PACERONE) 200 MG tablet TAKE 1/2 OF A TABLET BY MOUTH EVERY DAY  . amLODipine (NORVASC) 5 MG tablet Take 1 tablet (5 mg total) by mouth daily.  . clonazePAM (KLONOPIN) 0.5 MG tablet Take 0.5 mg by mouth at bedtime. For restless leg syndrome  . ezetimibe (ZETIA) 10 MG tablet Take 10 mg by mouth daily.  . isosorbide mononitrate (IMDUR) 30 MG 24 hr tablet Take 15 mg by mouth every morning.   . Levothyroxine Sodium 100 MCG CAPS Take 100 mcg by mouth daily before breakfast.   . warfarin (COUMADIN) 2 MG tablet Take 2 mg by mouth. TAKE AS DIRECTED BY PMD   No  facility-administered encounter medications on file as of 12/09/2016.    ALLERGIES: Allergies  Allergen Reactions  . Ciprofloxacin Other (See Comments)    Unknown   . Hctz [Hydrochlorothiazide] Other (See Comments)    Unknown   . Lopid [Gemfibrozil] Other (See Comments)    Unknown      VACCINATION STATUS:  There is no immunization history on file for this patient.  HPI 83 year old female patient with medical history as above. She is being seen in consultation for hypercalcemia and hypothyroidism requested by her PCP [facility], PA-C. - History is obtained from the patient, she is accompanied by her younger sister. - She sees she "always had calcium problem", although she never  Has required  therapy. - She denies history of nephrolithiasis, seizure disorders. She was found to have calcium 10.7 and August 2017 and 10.8 on 10/18/2016. - She was also recently diagnosed with atrial fibrillation-currently wearing pacemaker. She is not sure if she had bone density done in the past. She had mild renal insufficiency. -Her labs did not show PTH levels. She denies taking over-the-counter calcium supplements at least in recent years. -At baseline, she is ambulatory using a cane. - She has taken thyroid hormone for the last 15 years. Currently her doses of levothyroxine 100 g by mouth every  morning. She is compliant. - She is taking warfarin 2 mg every day except Mondays.   Review of Systems  Constitutional: Has lost 6 pounds over the last 6 month  , no fatigue, no subjective hyperthermia, no subjective hypothermia Eyes: no blurry vision, no xerophthalmia ENT: no sore throat, no nodules palpated in throat, no dysphagia/odynophagia, no hoarseness Cardiovascular: no Chest Pain, no Shortness of Breath, no palpitations, no leg swelling Respiratory: no cough, no SOB Gastrointestinal: no Nausea/Vomiting/Diarhhea Musculoskeletal: no muscle/joint aches Skin: + Easy bruising, no  rashes Neurological: no tremors, no numbness, no tingling, no dizziness Psychiatric: no depression, no anxiety  Objective:    BP 135/71   Pulse 90   Ht 5\' 3"  (1.6 m)   Wt 146 lb (66.2 kg)   BMI 25.86 kg/m   Wt Readings from Last 3 Encounters:  12/09/16 146 lb (66.2 kg)  11/25/16 145 lb (65.8 kg)  04/30/16 152 lb (68.9 kg)    Physical Exam  Constitutional:  weight for hight, not in acute distress, normal state of mind Eyes: PERRLA, EOMI, no exophthalmos ENT: moist mucous membranes, no thyromegaly, no cervical lymphadenopathy Cardiovascular: normal precordial activity, Regular Rate and Rhythm, no Murmur/Rubs/Gallops Respiratory:  adequate breathing efforts, no gross chest deformity, Clear to auscultation bilaterally Gastrointestinal: abdomen soft, Non -tender, No distension, Bowel Sounds present Musculoskeletal: + Diffuse arthritic changes in her bilateral upper extremities,  strength intact in all four extremities Skin: moist, warm, no rashes Neurological: no tremor with outstretched hands, Deep tendon reflexes normal in all four extremities.   CMP     Component Value Date/Time   NA 139 05/10/2015 2147   K 4.4 05/10/2015 2147   CL 107 05/10/2015 2147   CO2 26 05/10/2015 2147   GLUCOSE 119 (H) 05/10/2015 2147   BUN 25 (H) 05/10/2015 2147   CREATININE 1.26 (H) 05/10/2015 2147   CALCIUM 10.2 05/10/2015 2147   PROT 6.8 08/10/2014 1644   ALBUMIN 3.2 (L) 08/10/2014 1644   AST 24 08/10/2014 1644   ALT 18 08/10/2014 1644   ALKPHOS 89 08/10/2014 1644   BILITOT 0.2 (L) 08/10/2014 1644   GFRNONAA 37 (L) 05/10/2015 2147   GFRAA 43 (L) 05/10/2015 2147     Diabetic Labs (most recent): Lab Results  Component Value Date   HGBA1C 5.6 06/14/2012   04/30/2016 calcium 10.7, general 22nd 2018 calcium 10.8; TSH from 10/18/2016 was 2.09   Assessment & Plan:   1. Hypercalcemia - I have reviewed her available records and clinically evaluated patient. She seems to have  long-standing mild hypercalcemia most recently 10.8. Her labs do not show PTH, vitamin D. - I will proceed to obtain PTH/calcium, magnesium, phosphorus, vitamin D. - Possibilities are primary hyperparathyroidism versus secondary hyperparathyroidism/FHH. - If her PTH is low, she will be considered for PTH-related peptide, to rule out malignancy related hypercalcemia. - Even if she is confirmed to have primary hyperparathyroidism, she is not a surgical candidate. However if calcium is significantly above target and has high PTH, she will be considered for Sensipar therapy. - She will need bone density to rule out osteoporosis.  2. Other specified hypothyroidism - Her TSH from 10/18/2016 was 2.09. I advised her to continue levothyroxine 100 g by mouth every morning.  - We discussed about correct intake of levothyroxine, at fasting, with water, separated by at least 30 minutes from breakfast, and separated by more than 4 hours from calcium, iron, multivitamins, acid reflux medications (PPIs). -Patient is made aware of the fact that  thyroid hormone replacement is needed for life, dose to be adjusted by periodic monitoring of thyroid function tests.  - I advised patient to maintain close follow up with [facility], PA-C for primary care needs. Follow up plan: Return in about 1 week (around 12/16/2016) for labs today.  Glade Lloyd, MD Phone: [phone]  Fax: [phone]   12/09/2016, 11:52 AM

## 2016-12-10 LAB — VITAMIN D 25 HYDROXY (VIT D DEFICIENCY, FRACTURES): VIT D 25 HYDROXY: 21 ng/mL — AB (ref 30–100)

## 2016-12-10 LAB — PTH, INTACT AND CALCIUM
Calcium: 10.6 mg/dL — ABNORMAL HIGH (ref 8.6–10.4)
PTH: 106 pg/mL — ABNORMAL HIGH (ref 14–64)

## 2016-12-16 ENCOUNTER — Encounter: Payer: Self-pay | Admitting: "Endocrinology

## 2016-12-16 ENCOUNTER — Ambulatory Visit (INDEPENDENT_AMBULATORY_CARE_PROVIDER_SITE_OTHER): Payer: Medicare Other | Admitting: "Endocrinology

## 2016-12-16 DIAGNOSIS — E038 Other specified hypothyroidism: Secondary | ICD-10-CM

## 2016-12-16 DIAGNOSIS — E210 Primary hyperparathyroidism: Secondary | ICD-10-CM

## 2016-12-16 MED ORDER — CINACALCET HCL 30 MG PO TABS: 30.0000 mg | ORAL_TABLET | Freq: Every day | ORAL | 4 refills | Status: DC

## 2016-12-16 MED ORDER — VITAMIN D3 125 MCG (5000 UT) PO CAPS: 5000.0000 [IU] | ORAL_CAPSULE | Freq: Every day | ORAL | 0 refills | Status: DC

## 2016-12-16 NOTE — Progress Notes (Signed)
Subjective:    Patient ID: Melanie [name], female    DOB: [birth year], PCP Tawni Carnes, PA-C   Past Medical History:  Diagnosis Date  . CAD (coronary artery disease)    Two vessel, quiescent; 70% LAD aretery disease and well collateralized, 100% RCA by cardiac cath in 2004; NL LVF; low risk Cardioloite; EF 74%, 11/10  . Dyslipidemia   . Hypercalcemia   . Hypertension   . Hypothyroidism   . Paroxysmal atrial fibrillation (HCC)    on coumadin and amiodarone  . Sinus node dysfunction (HCC)    s/p Medtronic dual-chamber pacemaker  . Vertigo    Past Surgical History:  Procedure Laterality Date  . ABDOMINAL HYSTERECTOMY    . FRACTURE SURGERY    . PACEMAKER GENERATOR CHANGE N/A 02/12/2013   Procedure: PACEMAKER GENERATOR CHANGE;  Surgeon: Deboraha Sprang, MD;  Location: [facility] CATH LAB;  Service: Cardiovascular;  Laterality: N/A;  . PACEMAKER INSERTION  05/24/03   MDT [facility]   Social History   Social History  . Marital status: Single    Spouse name: N/A  . Number of children: N/A  . Years of education: N/A   Social History Main Topics  . Smoking status: Never Smoker  . Smokeless tobacco: Never Used  . Alcohol use No  . Drug use: No  . Sexual activity: Not Asked   Other Topics Concern  . None   Social History Narrative  . None   Outpatient Encounter Prescriptions as of 12/16/2016  Medication Sig  . amiodarone (PACERONE) 200 MG tablet TAKE 1/2 OF A TABLET BY MOUTH EVERY DAY  . amLODipine (NORVASC) 5 MG tablet Take 1 tablet (5 mg total) by mouth daily.  . cinacalcet (SENSIPAR) 30 MG tablet Take 1 tablet (30 mg total) by mouth daily with breakfast.  . clonazePAM (KLONOPIN) 0.5 MG tablet Take 0.5 mg by mouth at bedtime. For restless leg syndrome  . ezetimibe (ZETIA) 10 MG tablet Take 10 mg by mouth daily.  . isosorbide mononitrate (IMDUR) 30 MG 24 hr tablet Take 15 mg by mouth every morning.   . Levothyroxine Sodium 100 MCG CAPS Take 100 mcg by mouth daily before breakfast.    . warfarin (COUMADIN) 2 MG tablet Take 2 mg by mouth. TAKE AS DIRECTED BY PMD   No facility-administered encounter medications on file as of 12/16/2016.    ALLERGIES: Allergies  Allergen Reactions  . Ciprofloxacin Other (See Comments)    Unknown   . Hctz [Hydrochlorothiazide] Other (See Comments)    Unknown   . Lopid [Gemfibrozil] Other (See Comments)    Unknown      VACCINATION STATUS:  There is no immunization history on file for this patient.  HPI 83 year old female patient with medical history as above. She is being seen in f/u  for hypercalcemia and hypothyroidism requested by her PCP [facility], PA-C. - History is obtained from the patient, she is accompanied by her younger sister. - She sees she "always had calcium problem", although she never  Has required  therapy. - She denies history of nephrolithiasis, seizure disorders. She was found to have calcium 10.7 and August 2017 and 10.8 on 10/18/2016. - Her repeat labs show PTH high at 106 along with high calcium of 10.6. - She was also recently diagnosed with atrial fibrillation-currently wearing pacemaker. She is not sure if she had bone density done in the past. She had mild renal insufficiency. - She denies taking over-the-counter calcium supplements at least in recent years. -At  baseline, she is ambulatory using a cane. - She has taken thyroid hormone for the last 15 years. Currently her doses of levothyroxine 100 g by mouth every morning. She is compliant. - She is taking warfarin 2 mg every day except Mondays.   Review of Systems  Constitutional: Has lost 6 pounds over the last 6 month  , no fatigue, no subjective hyperthermia, no subjective hypothermia Eyes: no blurry vision, no xerophthalmia ENT: no sore throat, no nodules palpated in throat, no dysphagia/odynophagia, no hoarseness Cardiovascular: no Chest Pain, no Shortness of Breath, no palpitations, no leg swelling Respiratory: no cough, no  SOB Gastrointestinal: no Nausea/Vomiting/Diarhhea Musculoskeletal: no muscle/joint aches Skin: + Easy bruising, no rashes Neurological: no tremors, no numbness, no tingling, no dizziness Psychiatric: no depression, no anxiety  Objective:    BP 140/81   Pulse 88   Ht 5\' 3"  (1.6 m)   Wt 145 lb (65.8 kg)   BMI 25.69 kg/m   Wt Readings from Last 3 Encounters:  12/16/16 145 lb (65.8 kg)  12/09/16 146 lb (66.2 kg)  11/25/16 145 lb (65.8 kg)    Physical Exam  Constitutional:  Appropriate weight for hight, not in acute distress, normal state of mind Eyes: PERRLA, EOMI, no exophthalmos ENT: moist mucous membranes, no thyromegaly, no cervical lymphadenopathy Cardiovascular: normal precordial activity, Regular Rate and Rhythm, no Murmur/Rubs/Gallops Respiratory:  adequate breathing efforts, no gross chest deformity, Clear to auscultation bilaterally Gastrointestinal: abdomen soft, Non -tender, No distension, Bowel Sounds present Musculoskeletal: + Diffuse arthritic changes in her bilateral upper extremities,  strength intact in all four extremities Skin: moist, warm, no rashes Neurological: no tremor with outstretched hands, Deep tendon reflexes normal in all four extremities.   Recent Results (from the past 2160 hour(s))  CUP PACEART REMOTE DEVICE CHECK     Status: None   Collection Time: 11/11/16  7:38 PM  Result Value Ref Range   Date Time Interrogation Session [id number]    Pulse Generator Manufacturer MERM    Pulse Gen Model ADDRL1 Adapta    Pulse Gen Serial Number [id number]    Clinic Name [facility]    Implantable Pulse Generator Type Implantable Pulse Generator    Implantable Pulse Generator Implant Date 62229798    Implantable Lead Manufacturer MERM    Implantable Lead Model 4092 CapSure SP Novus    Implantable Lead Serial Number [id number]    Implantable Lead Implant Date 92119417    Implantable Lead Location [id number]    Implantable Lead Manufacturer [facility]     Implantable Lead Model 1642T IsoFlex S    Implantable Lead Serial Number [id number]    Implantable Lead Implant Date [id number]    Implantable Lead Location [id number]    Lead Channel Setting Sensing Sensitivity 2.80 mV   Lead Channel Setting Pacing Amplitude 2.000 V   Lead Channel Setting Pacing Pulse Width 0.40 ms   Lead Channel Setting Pacing Amplitude 2.500 V   Lead Channel Impedance Value 510 ohm   Lead Channel Pacing Threshold Amplitude 0.500 V   Lead Channel Pacing Threshold Pulse Width 0.40 ms   Lead Channel Impedance Value 1,060 ohm   Lead Channel Pacing Threshold Amplitude 1.750 V   Lead Channel Pacing Threshold Pulse Width 0.40 ms   Battery Status OK    Battery Remaining Longevity 130 mo   Battery Voltage 2.80 V   Battery Impedance 184 ohm   Brady Statistic AP VP Percent 0 %   Brady Statistic AS VP Percent 0 %  Brady Statistic AP VS Percent 61 %   Brady Statistic AS VS Percent 38 %   Eval Rhythm AS,VS/PACs   VITAMIN D 25 Hydroxy (Vit-D Deficiency, Fractures)     Status: Abnormal   Collection Time: 12/09/16  9:36 AM  Result Value Ref Range   Vit D, 25-Hydroxy 21 (L) 30 - 100 ng/mL    Comment: Vitamin D Status           25-OH Vitamin D        Deficiency                <20 ng/mL        Insufficiency         20 - 29 ng/mL        Optimal             > or = 30 ng/mL   For 25-OH Vitamin D testing on patients on D2-supplementation and patients for whom quantitation of D2 and D3 fractions is required, the QuestAssureD 25-OH VIT D, (D2,D3), LC/MS/MS is recommended: order code [id number] (patients > 2 yrs).   PTH, intact and calcium     Status: Abnormal   Collection Time: 12/09/16  9:36 AM  Result Value Ref Range   PTH 106 (H) 14 - 64 pg/mL   Calcium 10.6 (H) 8.6 - 10.4 mg/dL    Comment:   Interpretive Guide:                              Intact PTH               Calcium                              ----------               ------- Normal Parathyroid           Normal                    Normal Hypoparathyroidism           Low or Low Normal        Low Hyperparathyroidism      Primary                 Normal or High           High      Secondary               High                     Normal or Low      Tertiary                High                     High Non-Parathyroid   Hypercalcemia              Low or Low Normal        High   Magnesium     Status: None   Collection Time: 12/09/16  9:36 AM  Result Value Ref Range   Magnesium 2.0 1.5 - 2.5 mg/dL  Phosphorus     Status: None   Collection Time: 12/09/16  9:36 AM  Result Value Ref Range   Phosphorus 2.6 2.1 - 4.3 mg/dL  Comprehensive metabolic panel     Status: Abnormal   Collection Time: 12/09/16  9:36 AM  Result Value Ref Range   Sodium 142 135 - 146 mmol/L   Potassium 4.2 3.5 - 5.3 mmol/L   Chloride 107 98 - 110 mmol/L   CO2 23 20 - 31 mmol/L   Glucose, Bld 93 65 - 99 mg/dL   BUN 16 7 - 25 mg/dL   Creat 1.07 (H) 0.60 - 0.88 mg/dL    Comment:   For patients > or = 81 years of age: The upper reference limit for Creatinine is approximately 13% higher for people identified as African-American.      Total Bilirubin 0.4 0.2 - 1.2 mg/dL   Alkaline Phosphatase 102 33 - 130 U/L   AST 27 10 - 35 U/L   ALT 20 6 - 29 U/L   Total Protein 7.1 6.1 - 8.1 g/dL   Albumin 4.1 3.6 - 5.1 g/dL   Calcium 10.6 (H) 8.6 - 10.4 mg/dL  T4, free     Status: None   Collection Time: 12/09/16  9:36 AM  Result Value Ref Range   Free T4 1.3 0.8 - 1.8 ng/dL  TSH     Status: None   Collection Time: 12/09/16  9:36 AM  Result Value Ref Range   TSH 2.60 mIU/L    Comment:   Reference Range   > or = 20 Years  0.40-4.50   Pregnancy Range First trimester  0.26-2.66 Second trimester 0.55-2.73 Third trimester  0.43-2.91         Diabetic Labs (most recent): Lab Results  Component Value Date   HGBA1C 5.6 06/14/2012   04/30/2016 calcium 10.7, general 22nd 2018 calcium 10.8; TSH from 10/18/2016 was 2.09   Assessment & Plan:    1. Hypercalcemia Due to primary hyperparathyroidism - she is not a surgical candidate. I discussed her options with her. she will be considered for Sensipar therapy- starting with 30 mg by mouth daily. - She will need bone density to rule out osteoporosis. - I will add vitamin D 3 5000 units daily for the next 90 days.  2. Other specified hypothyroidism - Her  thyroid function tests are consistent with appropriate replacement. -  I advised her to continue levothyroxine 100 g by mouth every morning.  - We discussed about correct intake of levothyroxine, at fasting, with water, separated by at least 30 minutes from breakfast, and separated by more than 4 hours from calcium, iron, multivitamins, acid reflux medications (PPIs). -Patient is made aware of the fact that thyroid hormone replacement is needed for life, dose to be adjusted by periodic monitoring of thyroid function tests.  - I advised patient to maintain close follow up with [facility], PA-C for primary care needs. Follow up plan: Return in about 4 months (around 04/17/2017) for follow up with pre-visit labs, Bone Density.  Glade Lloyd, MD Phone: [phone]  Fax: [phone]   12/16/2016, 2:51 PM

## 2016-12-28 ENCOUNTER — Telehealth: Payer: Self-pay | Admitting: *Deleted

## 2016-12-28 NOTE — Telephone Encounter (Signed)
I would try her on lopressor 25mg  po q 8hrs prn palpitations for now. Update [place] later in the week when Dr Raliegh Ip gets back how her symptoms. He may consider adjusting her amiodarone. Metoprolol would be good to try as needed as it often can help relieve or prevent those episodes. Emphasize to take only as needed.    Zandra Abts MD

## 2016-12-28 NOTE — Telephone Encounter (Signed)
Patient calling with concerns about her heart palpitations & racing.  Seems like this has changed to her since last OV on 11/25/2016 with Jory Sims.  Seems that it is happening more often now.  No SOB, no chest pain.  States that she does have dizziness occasionally.  Also, c/o having a lot of indigestion lately with gas / burping.  Stated that she was concerned and felt like she needed to be checked out today.  Offered appointment with KL this afternoon, but she declined.  Message fwd to Dr. Harl Bowie as Dr. Bronson Ing is out of office today.

## 2016-12-30 NOTE — Telephone Encounter (Signed)
Left message to return call 

## 2016-12-30 NOTE — Telephone Encounter (Signed)
Agree with Dr. Nelly Laurence plan.

## 2016-12-31 MED ORDER — METOPROLOL TARTRATE 25 MG PO TABS: 25.0000 mg | ORAL_TABLET | Freq: Three times a day (TID) | ORAL | 2 refills | Status: DC | PRN

## 2016-12-31 NOTE — Telephone Encounter (Signed)
Left message to return call 

## 2016-12-31 NOTE — Telephone Encounter (Signed)
patient returned call.

## 2016-12-31 NOTE — Telephone Encounter (Signed)
Patient notified.  Stated she is not sure about starting anything new without being checked out first.  Offered OV in [place] office with extenders & she declines.  Preferred to see Dr. Bronson Ing.  Patient persistent in her request to be seen.  Feels like she needs to be checked out.  OV scheduled for 01/19/2017 at 10:20 with Dr. Bronson Ing in [place] office.  Request that we only send a small amount on the new medication.

## 2017-01-04 DIAGNOSIS — I482 Chronic atrial fibrillation: Secondary | ICD-10-CM | POA: Diagnosis not present

## 2017-01-05 ENCOUNTER — Emergency Department (HOSPITAL_COMMUNITY): Payer: Medicare Other

## 2017-01-05 ENCOUNTER — Emergency Department (HOSPITAL_COMMUNITY)
Admission: EM | Admit: 2017-01-05 | Discharge: 2017-01-05 | Disposition: A | Discharge status: Home / Self Care | Payer: Medicare Other | Attending: Emergency Medicine | Admitting: Emergency Medicine

## 2017-01-05 ENCOUNTER — Encounter (HOSPITAL_COMMUNITY): Payer: Self-pay | Admitting: Emergency Medicine

## 2017-01-05 DIAGNOSIS — I10 Essential (primary) hypertension: Secondary | ICD-10-CM | POA: Insufficient documentation

## 2017-01-05 DIAGNOSIS — R918 Other nonspecific abnormal finding of lung field: Secondary | ICD-10-CM | POA: Diagnosis not present

## 2017-01-05 DIAGNOSIS — I2510 Atherosclerotic heart disease of native coronary artery without angina pectoris: Secondary | ICD-10-CM | POA: Diagnosis not present

## 2017-01-05 DIAGNOSIS — R002 Palpitations: Secondary | ICD-10-CM | POA: Insufficient documentation

## 2017-01-05 DIAGNOSIS — Z950 Presence of cardiac pacemaker: Secondary | ICD-10-CM | POA: Insufficient documentation

## 2017-01-05 DIAGNOSIS — Z7901 Long term (current) use of anticoagulants: Secondary | ICD-10-CM | POA: Diagnosis not present

## 2017-01-05 DIAGNOSIS — E039 Hypothyroidism, unspecified: Secondary | ICD-10-CM | POA: Diagnosis not present

## 2017-01-05 DIAGNOSIS — Z79899 Other long term (current) drug therapy: Secondary | ICD-10-CM | POA: Diagnosis not present

## 2017-01-05 LAB — CBC WITH DIFFERENTIAL/PLATELET
BASOS ABS: 0 10*3/uL (ref 0.0–0.1)
BASOS PCT: 0 %
Eosinophils Absolute: 0.1 10*3/uL (ref 0.0–0.7)
Eosinophils Relative: 2 %
HEMATOCRIT: 36.9 % (ref 36.0–46.0)
HEMOGLOBIN: 12.3 g/dL (ref 12.0–15.0)
LYMPHS PCT: 38 %
Lymphs Abs: 2 10*3/uL (ref 0.7–4.0)
MCH: 30.4 pg (ref 26.0–34.0)
MCHC: 33.3 g/dL (ref 30.0–36.0)
MCV: 91.1 fL (ref 78.0–100.0)
Monocytes Absolute: 0.7 10*3/uL (ref 0.1–1.0)
Monocytes Relative: 14 %
NEUTROS ABS: 2.3 10*3/uL (ref 1.7–7.7)
NEUTROS PCT: 46 %
Platelets: 161 10*3/uL (ref 150–400)
RBC: 4.05 MIL/uL (ref 3.87–5.11)
RDW: 13.9 % (ref 11.5–15.5)
WBC: 5.1 10*3/uL (ref 4.0–10.5)

## 2017-01-05 LAB — BASIC METABOLIC PANEL
ANION GAP: 7 (ref 5–15)
BUN: 13 mg/dL (ref 6–20)
CALCIUM: 9.1 mg/dL (ref 8.9–10.3)
CHLORIDE: 101 mmol/L (ref 101–111)
CO2: 29 mmol/L (ref 22–32)
Creatinine, Ser: 0.95 mg/dL (ref 0.44–1.00)
GFR calc non Af Amer: 52 mL/min — ABNORMAL LOW (ref >60)
Glucose, Bld: 96 mg/dL (ref 65–99)
POTASSIUM: 3.9 mmol/L (ref 3.5–5.1)
Sodium: 137 mmol/L (ref 135–145)

## 2017-01-05 LAB — TROPONIN I: Troponin I: <0.03 ng/mL (ref <0.03)

## 2017-01-05 MED ORDER — METOPROLOL TARTRATE 5 MG/5ML IV SOLN
2.5000 mg | Freq: Once | INTRAVENOUS | Status: AC
  Administered 2017-01-05: 2.5 mg via INTRAVENOUS
  Filled 2017-01-05: qty 5

## 2017-01-05 NOTE — Discharge Instructions (Signed)
Take the Lopressor prescribed by your doctor if needed for palpitations. Follow-up in 2 weeks as planned to see her cardiologist.

## 2017-01-05 NOTE — ED Notes (Signed)
ED Provider at bedside. 

## 2017-01-05 NOTE — ED Triage Notes (Signed)
Pt c/o heart palpitations "for several weeks". Denies pain/sob/dizziness. Pt states it is worse when she lays down to go to bed. A/o.

## 2017-01-05 NOTE — ED Provider Notes (Signed)
[facility] DEPT Provider Note   CSN: [id number] Arrival date & time: 01/05/17  1050   By signing my name below, I, Melanie [name], attest that this documentation has been prepared under the direction and in the presence of Melanie Ferguson, MD  Electronically Signed: Delton [name], ED Scribe. 01/05/17. 11:26 AM.   History   Chief Complaint Chief Complaint  Patient presents with  . Palpitations    HPI Comments:  Melanie [name] is a 83 y.o. female, with a PMHx of atrial fibrillation on amiodarone, CAD and HTN, who presents to the Emergency Department complaining of persistent palpitations x several weeks. Pt states she was prescribed Lopressor on 12/31/16 but notes she has not taken this medication. No alleviating or aggravating factors noted. Pt denies chest pain, SOB or any other associated symptoms. No other complaints noted.   The history is provided by the patient. No language interpreter was used.  Palpitations   This is a recurrent problem. The current episode started more than 1 week ago. The problem has not changed since onset.The problem is associated with an unknown factor. Pertinent negatives include no chest pain, no abdominal pain, no headaches, no back pain, no cough and no shortness of breath. She has tried nothing for the symptoms.    Past Medical History:  Diagnosis Date  . CAD (coronary artery disease)    Two vessel, quiescent; 70% LAD aretery disease and well collateralized, 100% RCA by cardiac cath in 2004; NL LVF; low risk Cardioloite; EF 74%, 11/10  . Dyslipidemia   . Hypercalcemia   . Hypertension   . Hypothyroidism   . Paroxysmal atrial fibrillation (HCC)    on coumadin and amiodarone  . Sinus node dysfunction (HCC)    s/p Medtronic dual-chamber pacemaker  . Vertigo     Patient Active Problem List   Diagnosis Date Noted  . Hyperparathyroidism, primary ([place]) 12/16/2016  . Femur fracture, right ([place]) 06/11/2012  . Anemia 08/25/2011  . Long term current  use of anticoagulant 12/12/2010  . Hypothyroidism 08/12/2009  . HYPERCALCEMIA 08/12/2009  . Sinoatrial node dysfunction ([place]) 08/12/2009  . DYSPNEA ON EXERTION 08/12/2009  . PACEMAKER-Medtronic 08/12/2009  . HYPERLIPIDEMIA-MIXED 07/11/2008  . HYPERTENSION, BENIGN 07/11/2008  . CAD, NATIVE VESSEL 07/11/2008  . ATRIAL FIBRILLATION, PAROXYSMAL 07/11/2008    Past Surgical History:  Procedure Laterality Date  . ABDOMINAL HYSTERECTOMY    . FRACTURE SURGERY    . PACEMAKER GENERATOR CHANGE N/A 02/12/2013   Procedure: PACEMAKER GENERATOR CHANGE;  Surgeon: Deboraha Sprang, MD;  Location: [facility] CATH LAB;  Service: Cardiovascular;  Laterality: N/A;  . PACEMAKER INSERTION  05/24/03   MDT Kappa    OB History    No data available       Home Medications    Prior to Admission medications   Medication Sig Start Date End Date Taking? Authorizing Provider  amiodarone (PACERONE) 200 MG tablet TAKE 1/2 OF A TABLET BY MOUTH EVERY DAY 10/13/16   Herminio Commons, MD  amLODipine (NORVASC) 5 MG tablet Take 1 tablet (5 mg total) by mouth daily. 11/25/16 02/23/17  Herminio Commons, MD  Cholecalciferol (VITAMIN D3) 5000 units CAPS Take 1 capsule (5,000 Units total) by mouth daily. 12/16/16   Cassandria Anger, MD  cinacalcet (SENSIPAR) 30 MG tablet Take 1 tablet (30 mg total) by mouth daily with breakfast. 12/16/16   Cassandria Anger, MD  clonazePAM (KLONOPIN) 0.5 MG tablet Take 0.5 mg by mouth at bedtime. For restless leg syndrome  Historical Provider, MD  ezetimibe (ZETIA) 10 MG tablet Take 10 mg by mouth daily.    Historical Provider, MD  isosorbide mononitrate (IMDUR) 30 MG 24 hr tablet Take 15 mg by mouth every morning.     Historical Provider, MD  Levothyroxine Sodium 100 MCG CAPS Take 100 mcg by mouth daily before breakfast.     Historical Provider, MD  metoprolol tartrate (LOPRESSOR) 25 MG tablet Take 1 tablet (25 mg total) by mouth 3 (three) times daily as needed (palpitaitons). 12/31/16    Herminio Commons, MD  warfarin (COUMADIN) 2 MG tablet Take 2 mg by mouth. TAKE AS DIRECTED BY PMD    Historical Provider, MD    Family History Family History  Problem Relation Age of Onset  . Hypertension Neg Hx   . Diabetes Neg Hx   . Coronary artery disease Neg Hx     Social History Social History  Substance Use Topics  . Smoking status: Never Smoker  . Smokeless tobacco: Never Used  . Alcohol use No     Allergies   Ciprofloxacin; Hctz [hydrochlorothiazide]; and Lopid [gemfibrozil]   Review of Systems Review of Systems  Constitutional: Negative for appetite change and fatigue.  HENT: Negative for congestion, ear discharge and sinus pressure.   Eyes: Negative for discharge.  Respiratory: Negative for cough and shortness of breath.   Cardiovascular: Positive for palpitations. Negative for chest pain.  Gastrointestinal: Negative for abdominal pain and diarrhea.  Genitourinary: Negative for frequency and hematuria.  Musculoskeletal: Negative for back pain.  Skin: Negative for rash.  Neurological: Negative for seizures and headaches.  Psychiatric/Behavioral: Negative for hallucinations.     Physical Exam Updated Vital Signs BP (!) 171/88 (BP Location: Left Arm)   Pulse (!) 113   Temp 98.1 F (36.7 C) (Oral)   Resp 18   Ht 5\' 3"  (1.6 m)   Wt 145 lb (65.8 kg)   SpO2 96%   BMI 25.69 kg/m   Physical Exam  Constitutional: She is oriented to person, place, and time. She appears well-developed.  HENT:  Head: Normocephalic.  Eyes: Conjunctivae and EOM are normal. No scleral icterus.  Neck: Neck supple. No thyromegaly present.  Cardiovascular: An irregular rhythm present. Exam reveals no gallop and no friction rub.   No murmur heard. Rapid, irregular heart beat.   Pulmonary/Chest: No stridor. She has no wheezes. She has no rales. She exhibits no tenderness.  Abdominal: She exhibits no distension. There is no tenderness. There is no rebound.  Musculoskeletal:  Normal range of motion. She exhibits no edema.  Lymphadenopathy:    She has no cervical adenopathy.  Neurological: She is oriented to person, place, and time. She exhibits normal muscle tone. Coordination normal.  Skin: No rash noted. No erythema.  Psychiatric: She has a normal mood and affect. Her behavior is normal.  Nursing note and vitals reviewed.    ED Treatments / Results  DIAGNOSTIC STUDIES:  Oxygen Saturation is 96% on RA, normal by my interpretation.    COORDINATION OF CARE:  11:23 AM Discussed treatment plan with pt at bedside and pt agreed to plan.  Labs (all labs ordered are listed, but only abnormal results are displayed) Labs Reviewed  CBC WITH DIFFERENTIAL/PLATELET  BASIC METABOLIC PANEL  TROPONIN I    EKG  EKG Interpretation None       Radiology No results found.  Procedures Procedures (including critical care time)  Medications Ordered in ED Medications - No data to display   Initial  Impression / Assessment and Plan / ED Course  I have reviewed the triage vital signs and the nursing notes.  Pertinent labs & imaging results that were available during my care of the patient were reviewed by me and considered in my medical decision making (see chart for details).     Patient with palpitations or rapid atrial fib. She was instructed to take Lopressor if needed but has not taken it. Labs unremarkable. Patient was given a small dose Lopressor and this slowed her heart down to about 60. Patient will be discharged home to follow-up with her cardiologist and will take her Lopressor as prescribed  Final Clinical Impressions(s) / ED Diagnoses   Final diagnoses:  None    New Prescriptions New Prescriptions   No medications on file  The chart was scribed for me under my direct supervision.  I personally performed the history, physical, and medical decision making and all procedures in the evaluation of this patient.Melanie Ferguson,  MD 01/05/17 [phone]

## 2017-01-06 DIAGNOSIS — I10 Essential (primary) hypertension: Secondary | ICD-10-CM | POA: Diagnosis not present

## 2017-01-06 DIAGNOSIS — I482 Chronic atrial fibrillation: Secondary | ICD-10-CM | POA: Diagnosis not present

## 2017-01-06 DIAGNOSIS — E039 Hypothyroidism, unspecified: Secondary | ICD-10-CM | POA: Diagnosis not present

## 2017-01-06 DIAGNOSIS — E782 Mixed hyperlipidemia: Secondary | ICD-10-CM | POA: Diagnosis not present

## 2017-01-06 DIAGNOSIS — G2581 Restless legs syndrome: Secondary | ICD-10-CM | POA: Diagnosis not present

## 2017-01-14 ENCOUNTER — Other Ambulatory Visit: Payer: Self-pay | Admitting: "Endocrinology

## 2017-01-19 ENCOUNTER — Ambulatory Visit: Payer: Medicare Other | Admitting: Cardiovascular Disease

## 2017-01-19 ENCOUNTER — Encounter: Payer: Self-pay | Admitting: Cardiovascular Disease

## 2017-01-19 ENCOUNTER — Ambulatory Visit (INDEPENDENT_AMBULATORY_CARE_PROVIDER_SITE_OTHER): Payer: Medicare Other | Admitting: Cardiovascular Disease

## 2017-01-19 VITALS — BP 140/76 | HR 86 | Ht 63.0 in | Wt 145.0 lb

## 2017-01-19 DIAGNOSIS — I10 Essential (primary) hypertension: Secondary | ICD-10-CM | POA: Diagnosis not present

## 2017-01-19 DIAGNOSIS — Z79899 Other long term (current) drug therapy: Secondary | ICD-10-CM | POA: Diagnosis not present

## 2017-01-19 DIAGNOSIS — I480 Paroxysmal atrial fibrillation: Secondary | ICD-10-CM | POA: Diagnosis not present

## 2017-01-19 DIAGNOSIS — Z9289 Personal history of other medical treatment: Secondary | ICD-10-CM | POA: Diagnosis not present

## 2017-01-19 DIAGNOSIS — R002 Palpitations: Secondary | ICD-10-CM | POA: Diagnosis not present

## 2017-01-19 DIAGNOSIS — Z7901 Long term (current) use of anticoagulants: Secondary | ICD-10-CM | POA: Diagnosis not present

## 2017-01-19 DIAGNOSIS — Z950 Presence of cardiac pacemaker: Secondary | ICD-10-CM | POA: Diagnosis not present

## 2017-01-19 DIAGNOSIS — I2510 Atherosclerotic heart disease of native coronary artery without angina pectoris: Secondary | ICD-10-CM

## 2017-01-19 MED ORDER — METOPROLOL TARTRATE 25 MG PO TABS: 12.5000 mg | ORAL_TABLET | Freq: Two times a day (BID) | ORAL | 2 refills | Status: DC

## 2017-01-19 NOTE — Progress Notes (Signed)
SUBJECTIVE: The patient presents for routine follow-up. She had called and complained of palpitations and was prescribed metoprolol 25 mg every 8 hours as needed. She has atrial fibrillation and takes amiodarone and warfarin. She has sick sinus syndrome and has a pacemaker. She also has coronary artery disease.  She was evaluated in the ED for palpitations on 01/05/17. I personally reviewed all labs, studies, and documentation.  She denies chest pain and shortness of breath. She continues to have palpitations every 3-4 days. She does not think she takes the metoprolol appropriately. She has upper central back pain. She has significant arthritis. She also has GERD. She denies leg swelling and orthopnea.   Review of Systems: As per "subjective", otherwise negative.  Allergies  Allergen Reactions  . Ciprofloxacin Other (See Comments)    Unknown   . Hctz [Hydrochlorothiazide] Other (See Comments)    Unknown   . Lopid [Gemfibrozil] Other (See Comments)    Unknown      Current Outpatient Prescriptions  Medication Sig Dispense Refill  . amiodarone (PACERONE) 200 MG tablet TAKE 1/2 OF A TABLET BY MOUTH EVERY DAY 15 tablet 3  . amLODipine (NORVASC) 5 MG tablet Take 1 tablet (5 mg total) by mouth daily. 90 tablet 3  . Cholecalciferol (VITAMIN D-3) 5000 units TABS TAKE 1 CAPSULE BY MOUTH EVERY DAY 90 tablet 1  . cinacalcet (SENSIPAR) 30 MG tablet Take 1 tablet (30 mg total) by mouth daily with breakfast. 30 tablet 4  . clonazePAM (KLONOPIN) 0.5 MG tablet Take 0.5 mg by mouth at bedtime. For restless leg syndrome    . ezetimibe (ZETIA) 10 MG tablet Take 10 mg by mouth daily.    . isosorbide mononitrate (IMDUR) 30 MG 24 hr tablet Take 15 mg by mouth every morning.     . Levothyroxine Sodium 100 MCG CAPS Take 100 mcg by mouth daily before breakfast.     . metoprolol tartrate (LOPRESSOR) 25 MG tablet Take 1 tablet (25 mg total) by mouth 3 (three) times daily as needed (palpitaitons). 15  tablet 2  . warfarin (COUMADIN) 2 MG tablet Take 2 mg by mouth. TAKE AS DIRECTED BY PMD     No current facility-administered medications for this visit.     Past Medical History:  Diagnosis Date  . CAD (coronary artery disease)    Two vessel, quiescent; 70% LAD aretery disease and well collateralized, 100% RCA by cardiac cath in 2004; NL LVF; low risk Cardioloite; EF 74%, 11/10  . Dyslipidemia   . Hypercalcemia   . Hypertension   . Hypothyroidism   . Paroxysmal atrial fibrillation (HCC)    on coumadin and amiodarone  . Sinus node dysfunction (HCC)    s/p Medtronic dual-chamber pacemaker  . Vertigo     Past Surgical History:  Procedure Laterality Date  . ABDOMINAL HYSTERECTOMY    . FRACTURE SURGERY    . PACEMAKER GENERATOR CHANGE N/A 02/12/2013   Procedure: PACEMAKER GENERATOR CHANGE;  Surgeon: Deboraha Sprang, MD;  Location: [facility] CATH LAB;  Service: Cardiovascular;  Laterality: N/A;  . PACEMAKER INSERTION  05/24/03   MDT Kappa    Social History   Social History  . Marital status: Single    Spouse name: N/A  . Number of children: N/A  . Years of education: N/A   Occupational History  . Not on file.   Social History Main Topics  . Smoking status: Never Smoker  . Smokeless tobacco: Never Used  . Alcohol use  No  . Drug use: No  . Sexual activity: Not on file   Other Topics Concern  . Not on file   Social History Narrative  . No narrative on file     Vitals:   01/19/17 1033  BP: 140/76  Pulse: 86  SpO2: 97%  Weight: 145 lb (65.8 kg)  Height: 5\' 3"  (1.6 m)    Wt Readings from Last 3 Encounters:  01/19/17 145 lb (65.8 kg)  01/05/17 145 lb (65.8 kg)  12/16/16 145 lb (65.8 kg)     PHYSICAL EXAM General: NAD HEENT: Normal. Neck: No JVD, no thyromegaly. Lungs: Clear to auscultation bilaterally with normal respiratory effort. CV: Nondisplaced PMI.  Regular rate and rhythm with premature contractions, normal S1/S2, no S3/S4, no murmur. No pretibial or  periankle edema.     Abdomen: Soft, nontender, no distention.  Neurologic: Alert and oriented.  Psych: Normal affect. Skin: Normal. Musculoskeletal: No gross deformities.    ECG: Most recent ECG reviewed.   Labs: Lab Results  Component Value Date/Time   K 3.9 01/05/2017 10:59 AM   BUN 13 01/05/2017 10:59 AM   CREATININE 0.95 01/05/2017 10:59 AM   CREATININE 1.07 (H) 12/09/2016 09:36 AM   ALT 20 12/09/2016 09:36 AM   TSH 2.60 12/09/2016 09:36 AM   HGB 12.3 01/05/2017 10:59 AM     Lipids: No results found for: LDLCALC, LDLDIRECT, CHOL, TRIG, HDL     ASSESSMENT AND PLAN: 1. CAD: Symptomatically stable on current medical therapy which includes amlodipine and long-acting nitrates. Not on statins any longer.  2. Essential HTN: Reasonably controlled for age. No changes.  3. Hyperlipidemia: Not on statins.  4. Paroxysmal atrial fibrillation with palpitations: Maintained on amiodarone and warfarin. TSH normal at 2.6 on 12/09/16. Liver transaminases were normal on 12/09/16 as well, AST 27, ALT 20. I will have her take metoprolol 12.5 mg twice daily to help suppress palpitations.  5. Pacemaker: Followed by Dr. Rayann Heman. Normal device function on 11/11/16 with 9 mode switches, less than 0.1%.   Time spent: 40 minutes, of which greater than 50% was spent reviewing symptoms, relevant blood tests and studies, and discussing management plan with the patient.   Disposition: Follow up 1 yr. Sees Dr. Rayann Heman in August 2018.  Kate Sable, M.D., F.A.C.C.

## 2017-01-19 NOTE — Patient Instructions (Signed)
Medication Instructions:  DECREASE METOPROLOL TO 12.5 MG - TWO TIMES DAILY   Labwork: NONE  Testing/Procedures: NONE  Follow-Up: Your physician wants you to follow-up in: 1 YEAR .  You will receive a reminder letter in the mail two months in advance. If you don't receive a letter, please call our office to schedule the follow-up appointment.   Any Other Special Instructions Will Be Listed Below (If Applicable).     If you need a refill on your cardiac medications before your next appointment, please call your pharmacy.

## 2017-01-26 ENCOUNTER — Encounter (HOSPITAL_COMMUNITY): Payer: Self-pay

## 2017-01-26 ENCOUNTER — Emergency Department (HOSPITAL_COMMUNITY)
Admission: EM | Admit: 2017-01-26 | Discharge: 2017-01-26 | Disposition: A | Discharge status: Home / Self Care | Payer: Medicare Other | Attending: Emergency Medicine | Admitting: Emergency Medicine

## 2017-01-26 ENCOUNTER — Emergency Department (HOSPITAL_COMMUNITY): Payer: Medicare Other

## 2017-01-26 DIAGNOSIS — K824 Cholesterolosis of gallbladder: Secondary | ICD-10-CM | POA: Diagnosis not present

## 2017-01-26 DIAGNOSIS — Z79899 Other long term (current) drug therapy: Secondary | ICD-10-CM | POA: Insufficient documentation

## 2017-01-26 DIAGNOSIS — E039 Hypothyroidism, unspecified: Secondary | ICD-10-CM | POA: Diagnosis not present

## 2017-01-26 DIAGNOSIS — I10 Essential (primary) hypertension: Secondary | ICD-10-CM | POA: Insufficient documentation

## 2017-01-26 DIAGNOSIS — I2510 Atherosclerotic heart disease of native coronary artery without angina pectoris: Secondary | ICD-10-CM | POA: Insufficient documentation

## 2017-01-26 DIAGNOSIS — Z7901 Long term (current) use of anticoagulants: Secondary | ICD-10-CM | POA: Diagnosis not present

## 2017-01-26 DIAGNOSIS — K828 Other specified diseases of gallbladder: Secondary | ICD-10-CM | POA: Diagnosis not present

## 2017-01-26 DIAGNOSIS — R1011 Right upper quadrant pain: Secondary | ICD-10-CM

## 2017-01-26 LAB — CBC WITH DIFFERENTIAL/PLATELET
BASOS ABS: 0 10*3/uL (ref 0.0–0.1)
BASOS PCT: 1 %
EOS ABS: 0.1 10*3/uL (ref 0.0–0.7)
Eosinophils Relative: 2 %
HEMATOCRIT: 40.2 % (ref 36.0–46.0)
Hemoglobin: 13.2 g/dL (ref 12.0–15.0)
Lymphocytes Relative: 37 %
Lymphs Abs: 2 10*3/uL (ref 0.7–4.0)
MCH: 30.1 pg (ref 26.0–34.0)
MCHC: 32.8 g/dL (ref 30.0–36.0)
MCV: 91.8 fL (ref 78.0–100.0)
MONO ABS: 1 10*3/uL (ref 0.1–1.0)
Monocytes Relative: 18 %
NEUTROS ABS: 2.3 10*3/uL (ref 1.7–7.7)
Neutrophils Relative %: 42 %
PLATELETS: 192 10*3/uL (ref 150–400)
RBC: 4.38 MIL/uL (ref 3.87–5.11)
RDW: 13.8 % (ref 11.5–15.5)
WBC: 5.3 10*3/uL (ref 4.0–10.5)

## 2017-01-26 LAB — COMPREHENSIVE METABOLIC PANEL
ALBUMIN: 4.2 g/dL (ref 3.5–5.0)
ALK PHOS: 107 U/L (ref 38–126)
ALT: 23 U/L (ref 14–54)
AST: 33 U/L (ref 15–41)
Anion gap: 8 (ref 5–15)
BUN: 17 mg/dL (ref 6–20)
CHLORIDE: 101 mmol/L (ref 101–111)
CO2: 30 mmol/L (ref 22–32)
Calcium: 9.4 mg/dL (ref 8.9–10.3)
Creatinine, Ser: 1.14 mg/dL — ABNORMAL HIGH (ref 0.44–1.00)
GFR calc non Af Amer: 42 mL/min — ABNORMAL LOW (ref >60)
GFR, EST AFRICAN AMERICAN: 48 mL/min — AB (ref >60)
Glucose, Bld: 95 mg/dL (ref 65–99)
Potassium: 4.3 mmol/L (ref 3.5–5.1)
SODIUM: 139 mmol/L (ref 135–145)
Total Bilirubin: 0.5 mg/dL (ref 0.3–1.2)
Total Protein: 8 g/dL (ref 6.5–8.1)

## 2017-01-26 LAB — LIPASE, BLOOD: LIPASE: 26 U/L (ref 11–51)

## 2017-01-26 MED ORDER — HYDROCODONE-ACETAMINOPHEN 5-325 MG PO TABS
1.0000 | ORAL_TABLET | ORAL | 0 refills | Status: DC | PRN
Start: 2017-01-26 — End: 2017-04-29

## 2017-01-26 MED ORDER — ONDANSETRON HCL 4 MG PO TABS: 4.0000 mg | ORAL_TABLET | Freq: Four times a day (QID) | ORAL | 0 refills | Status: DC

## 2017-01-26 MED ORDER — SODIUM CHLORIDE 0.9 % IV BOLUS (SEPSIS)
500.0000 mL | Freq: Once | INTRAVENOUS | Status: AC
  Administered 2017-01-26: 500 mL via INTRAVENOUS

## 2017-01-26 MED ORDER — ONDANSETRON HCL 4 MG/2ML IJ SOLN
4.0000 mg | Freq: Once | INTRAMUSCULAR | Status: AC
  Administered 2017-01-26: 4 mg via INTRAVENOUS
  Filled 2017-01-26: qty 2

## 2017-01-26 NOTE — ED Provider Notes (Signed)
[place] DEPT Provider Note   CSN: [id number] Arrival date & time: 01/26/17  0845   By signing my name below, I, Melanie [name], attest that this documentation has been prepared under the direction and in the presence of Nat Christen, MD. Electronically Signed: Hilbert [name], Scribe. 01/26/17. 9:16 AM. History   Chief Complaint Chief Complaint  Patient presents with  . Abdominal Pain     The history is provided by the patient. No language interpreter was used.  HPI Comments: Melanie [name] is a 83 y.o. female who presents to the Emergency Department complaining of worsening abdominal pain for the past few days. She states that the pain has been present for quite a while but has recently began to worsen. She states that the pain radiates to her back. She rates the pain 9/10. Her last bowel movement was this morning. The patient still has her gall bladder. She denies nausea, vomiting, or diarrhea recently.  Past Medical History:  Diagnosis Date  . CAD (coronary artery disease)    Two vessel, quiescent; 70% LAD aretery disease and well collateralized, 100% RCA by cardiac cath in 2004; NL LVF; low risk Cardioloite; EF 74%, 11/10  . Dyslipidemia   . Hypercalcemia   . Hypertension   . Hypothyroidism   . Paroxysmal atrial fibrillation (HCC)    on coumadin and amiodarone  . Sinus node dysfunction (HCC)    s/p Medtronic dual-chamber pacemaker  . Vertigo     Patient Active Problem List   Diagnosis Date Noted  . Hyperparathyroidism, primary ([place]) 12/16/2016  . Femur fracture, right ([place]) 06/11/2012  . Anemia 08/25/2011  . Long term current use of anticoagulant 12/12/2010  . Hypothyroidism 08/12/2009  . HYPERCALCEMIA 08/12/2009  . Sinoatrial node dysfunction ([place]) 08/12/2009  . DYSPNEA ON EXERTION 08/12/2009  . PACEMAKER-Medtronic 08/12/2009  . HYPERLIPIDEMIA-MIXED 07/11/2008  . HYPERTENSION, BENIGN 07/11/2008  . CAD, NATIVE VESSEL 07/11/2008  . ATRIAL FIBRILLATION,  PAROXYSMAL 07/11/2008    Past Surgical History:  Procedure Laterality Date  . ABDOMINAL HYSTERECTOMY    . FRACTURE SURGERY    . PACEMAKER GENERATOR CHANGE N/A 02/12/2013   Procedure: PACEMAKER GENERATOR CHANGE;  Surgeon: Deboraha Sprang, MD;  Location: [facility] CATH LAB;  Service: Cardiovascular;  Laterality: N/A;  . PACEMAKER INSERTION  05/24/03   MDT Kappa    OB History    No data available       Home Medications    Prior to Admission medications   Medication Sig Start Date End Date Taking? Authorizing Provider  amiodarone (PACERONE) 200 MG tablet TAKE 1/2 OF A TABLET BY MOUTH EVERY DAY 10/13/16  Yes Herminio Commons, MD  amLODipine (NORVASC) 5 MG tablet Take 1 tablet (5 mg total) by mouth daily. 11/25/16 02/23/17 Yes Herminio Commons, MD  Cholecalciferol (VITAMIN D-3) 5000 units TABS TAKE 1 CAPSULE BY MOUTH EVERY DAY 01/17/17  Yes Cassandria Anger, MD  cinacalcet (SENSIPAR) 30 MG tablet Take 1 tablet (30 mg total) by mouth daily with breakfast. 12/16/16  Yes Cassandria Anger, MD  clonazePAM (KLONOPIN) 0.5 MG tablet Take 0.5 mg by mouth at bedtime. For restless leg syndrome   Yes Historical Provider, MD  ezetimibe (ZETIA) 10 MG tablet Take 10 mg by mouth daily.   Yes Historical Provider, MD  isosorbide mononitrate (IMDUR) 30 MG 24 hr tablet Take 15 mg by mouth every morning.    Yes Historical Provider, MD  Levothyroxine Sodium 100 MCG CAPS Take 100 mcg by mouth daily before breakfast.  Yes Historical Provider, MD  metoprolol tartrate (LOPRESSOR) 25 MG tablet Take 0.5 tablets (12.5 mg total) by mouth 2 (two) times daily. 01/19/17  Yes Herminio Commons, MD  warfarin (COUMADIN) 2 MG tablet Take 2 mg by mouth. TAKE AS DIRECTED BY PMD   Yes Historical Provider, MD  HYDROcodone-acetaminophen (NORCO/VICODIN) 5-325 MG tablet Take 1 tablet by mouth every 4 (four) hours as needed. 01/26/17   Nat Christen, MD  ondansetron (ZOFRAN) 4 MG tablet Take 1 tablet (4 mg total) by mouth every 6 (six)  hours. 01/26/17   Nat Christen, MD    Family History Family History  Problem Relation Age of Onset  . Hypertension Neg Hx   . Diabetes Neg Hx   . Coronary artery disease Neg Hx     Social History Social History  Substance Use Topics  . Smoking status: Never Smoker  . Smokeless tobacco: Never Used  . Alcohol use No     Allergies   Ciprofloxacin; Hctz [hydrochlorothiazide]; and Lopid [gemfibrozil]   Review of Systems Review of Systems A complete 10 system review of systems was obtained and all systems are negative except as noted in the HPI and PMH.   Physical Exam Updated Vital Signs BP 138/60   Pulse 75   Temp 97.8 F (36.6 C) (Oral)   Resp 18   Ht 5\' 3"  (1.6 m)   Wt 145 lb (65.8 kg)   SpO2 94%   BMI 25.69 kg/m   Physical Exam  Constitutional: She is oriented to person, place, and time. She appears well-developed and well-nourished.  HENT:  Head: Normocephalic and atraumatic.  Eyes: Conjunctivae are normal.  Neck: Neck supple.  Cardiovascular: Normal rate and regular rhythm.   Pulmonary/Chest: Effort normal and breath sounds normal.  Abdominal: Soft. Bowel sounds are normal.  Minimal RUQ tenderness.  Musculoskeletal: Normal range of motion.  Neurological: She is alert and oriented to person, place, and time.  Skin: Skin is warm and dry.  Psychiatric: She has a normal mood and affect. Her behavior is normal.  Nursing note and vitals reviewed.  ED Treatments / Results  DIAGNOSTIC STUDIES: Oxygen Saturation is 98% on RA, normal by my interpretation.    COORDINATION OF CARE: 8:57 AM Discussed treatment plan with pt at bedside and pt agreed to plan. I will order a CT scan and check basic labs.  Labs (all labs ordered are listed, but only abnormal results are displayed) Labs Reviewed  COMPREHENSIVE METABOLIC PANEL - Abnormal; Notable for the following:       Result Value   Creatinine, Ser 1.14 (*)    GFR calc non Af Amer 42 (*)    GFR calc Af Amer 48 (*)      All other components within normal limits  CBC WITH DIFFERENTIAL/PLATELET  LIPASE, BLOOD    EKG  EKG Interpretation None       Radiology US Abdomen Limited  Result Date: 01/26/2017 CLINICAL DATA:  Right upper quadrant pain for the past day. History of intermittent episodes in the past. History of coronary artery disease and hyperlipidemia. EXAM: US ABDOMEN LIMITED - RIGHT UPPER QUADRANT COMPARISON:  Abdominal ultrasound of March 17, 2016 FINDINGS: Gallbladder: The gallbladder is adequately distended. There is an echogenic non mobile nonshadowing focus in the gallbladder near the neck. There is no gallbladder wall thickening, pericholecystic fluid, or positive sonographic Murphy's sign. Common bile duct: Diameter: 3.1 mm Liver: The hepatic echotexture is normal. There is no focal mass or ductal dilation. The  surface contour is normal. Bowel gas limits evaluation of the pancreatic head and tail. The pancreatic body is grossly normal. IMPRESSION: Non mobile nonshadowing structure most compatible with a polyp measuring 8 x 5 x 7 mm which was not previously demonstrated. No typical echogenic mobile shadowing stones. No sonographic evidence of acute cholecystitis. If there are clinical concerns of chronic cholecystitis, a nuclear medicine hepatobiliary scan with gallbladder ejection fraction determination may be useful. A 1 year follow-up ultrasound of the gallbladder is recommended to reassess this presumed benign polyp. Normal appearance of the liver and common bile duct. Bowel gas prevents visualization of the pancreatic head and tail. The pancreatic body is grossly normal. Electronically Signed   By: David  Martinique M.D.   On: 01/26/2017 11:04    Procedures Procedures (including critical care time)  Medications Ordered in ED Medications  sodium chloride 0.9 % bolus 500 mL (0 mLs Intravenous Stopped 01/26/17 1421)  ondansetron (ZOFRAN) injection 4 mg (4 mg Intravenous Given 01/26/17 0945)      Initial Impression / Assessment and Plan / ED Course  I have reviewed the triage vital signs and the nursing notes.  Pertinent labs & imaging results that were available during my care of the patient were reviewed by me and considered in my medical decision making (see chart for details).   Patient presents with right upper quadrant pain. Ultrasound reveals a gallbladder polyp. This may be the etiology of her symptoms. I discussed this scenario with the general surgeon on call Dr. Aviva Signs. He will see her as an outpatient for follow-up. Discharge medications Vicodin and Zofran 4 mg    Final Clinical Impressions(s) / ED Diagnoses   Final diagnoses:  RUQ pain  Gallbladder polyp    New Prescriptions New Prescriptions   HYDROCODONE-ACETAMINOPHEN (NORCO/VICODIN) 5-325 MG TABLET    Take 1 tablet by mouth every 4 (four) hours as needed.   ONDANSETRON (ZOFRAN) 4 MG TABLET    Take 1 tablet (4 mg total) by mouth every 6 (six) hours.   I personally performed the services described in this documentation, which was scribed in my presence. The recorded information has been reviewed and is accurate.     Nat Christen, MD 01/29/17 [phone]

## 2017-01-26 NOTE — ED Triage Notes (Signed)
Pt reports ruq pain radiating into back off and on for " a while" but worse last night.  Denies n/v/d.  LBM was this morning and was normal per pt.

## 2017-01-26 NOTE — Discharge Instructions (Signed)
You have a polyp in the gallbladder. I discussed the situation with the general surgeon on call Dr. Aviva Signs. He will see you on Tuesday, May 8 at 915 a.m.   Prescription for pain and nausea medication. Avoid rich greasy foods.

## 2017-02-01 ENCOUNTER — Encounter: Payer: Self-pay | Admitting: General Surgery

## 2017-02-01 ENCOUNTER — Ambulatory Visit (INDEPENDENT_AMBULATORY_CARE_PROVIDER_SITE_OTHER): Payer: Medicare Other | Admitting: General Surgery

## 2017-02-01 VITALS — BP 139/82 | HR 96 | Temp 98.0°F | Resp 20 | Ht 63.0 in | Wt 146.0 lb

## 2017-02-01 DIAGNOSIS — K824 Cholesterolosis of gallbladder: Secondary | ICD-10-CM

## 2017-02-01 NOTE — Progress Notes (Signed)
Melanie [name]; [id number]; 1926/03/25   HPI Patient is an 83 year old white female who was seen in the emergency room for evaluation treatment of right-sided back pain. She states this occurred suddenly. She also states she had mild epigastric bloating. This occurred soon after eating something. She does not remember what she ate. She was referred by the emergency room for further evaluation treatment of findings of a small gallbladder polyp on ultrasound. She states she has had intermittent episodes of right upper back pain. She denies any fever, chills, nausea, vomiting, fatty food intolerance, or jaundice. She currently has a pain of 6 out of 10, though she states she has no abdominal pain. She was referred by Dr. Lacinda Axon from the emergency room. Past Medical History:  Diagnosis Date  . CAD (coronary artery disease)    Two vessel, quiescent; 70% LAD aretery disease and well collateralized, 100% RCA by cardiac cath in 2004; NL LVF; low risk Cardioloite; EF 74%, 11/10  . Dyslipidemia   . Hypercalcemia   . Hypertension   . Hypothyroidism   . Paroxysmal atrial fibrillation (HCC)    on coumadin and amiodarone  . Sinus node dysfunction (HCC)    s/p Medtronic dual-chamber pacemaker  . Vertigo     Past Surgical History:  Procedure Laterality Date  . ABDOMINAL HYSTERECTOMY    . FRACTURE SURGERY    . PACEMAKER GENERATOR CHANGE N/A 02/12/2013   Procedure: PACEMAKER GENERATOR CHANGE;  Surgeon: Deboraha Sprang, MD;  Location: [facility] CATH LAB;  Service: Cardiovascular;  Laterality: N/A;  . PACEMAKER INSERTION  05/24/03   MDT Kappa    Family History  Problem Relation Age of Onset  . Hypertension Neg Hx   . Diabetes Neg Hx   . Coronary artery disease Neg Hx     Current Outpatient Prescriptions on File Prior to Visit  Medication Sig Dispense Refill  . amiodarone (PACERONE) 200 MG tablet TAKE 1/2 OF A TABLET BY MOUTH EVERY DAY 15 tablet 3  . amLODipine (NORVASC) 5 MG tablet Take 1 tablet (5 mg total) by  mouth daily. 90 tablet 3  . Cholecalciferol (VITAMIN D-3) 5000 units TABS TAKE 1 CAPSULE BY MOUTH EVERY DAY 90 tablet 1  . cinacalcet (SENSIPAR) 30 MG tablet Take 1 tablet (30 mg total) by mouth daily with breakfast. 30 tablet 4  . clonazePAM (KLONOPIN) 0.5 MG tablet Take 0.5 mg by mouth at bedtime. For restless leg syndrome    . ezetimibe (ZETIA) 10 MG tablet Take 10 mg by mouth daily.    Marland Kitchen HYDROcodone-acetaminophen (NORCO/VICODIN) 5-325 MG tablet Take 1 tablet by mouth every 4 (four) hours as needed. 20 tablet 0  . isosorbide mononitrate (IMDUR) 30 MG 24 hr tablet Take 15 mg by mouth every morning.     . Levothyroxine Sodium 100 MCG CAPS Take 100 mcg by mouth daily before breakfast.     . metoprolol tartrate (LOPRESSOR) 25 MG tablet Take 0.5 tablets (12.5 mg total) by mouth 2 (two) times daily. 45 tablet 2  . ondansetron (ZOFRAN) 4 MG tablet Take 1 tablet (4 mg total) by mouth every 6 (six) hours. 12 tablet 0  . warfarin (COUMADIN) 2 MG tablet Take 2 mg by mouth. TAKE AS DIRECTED BY PMD     No current facility-administered medications on file prior to visit.     Allergies  Allergen Reactions  . Ciprofloxacin Other (See Comments)    Unknown   . Hctz [Hydrochlorothiazide] Other (See Comments)    Unknown   .  Lopid [Gemfibrozil] Other (See Comments)    Unknown      History  Alcohol Use No    History  Smoking Status  . Never Smoker  Smokeless Tobacco  . Never Used    Review of Systems  Constitutional: Negative.   HENT: Negative.   Eyes: Positive for blurred vision.  Respiratory: Negative.   Cardiovascular: Negative.   Gastrointestinal:       Bloating  Genitourinary: Negative.   Musculoskeletal: Positive for back pain and joint pain.  Skin: Negative.   Neurological: Positive for dizziness.  Endo/Heme/Allergies: Bruises/bleeds easily.  Psychiatric/Behavioral: Negative.     Objective   Vitals:   02/01/17 0952  BP: 139/82  Pulse: 96  Resp: 20  Temp: 98 F (36.7  C)   Ultrasound report reviewed. ER note reviewed. Liver enzyme tests within normal limits. Physical Exam  Constitutional: She is oriented to person, place, and time and well-developed, well-nourished, and in no distress.  HENT:  Head: Normocephalic and atraumatic.  Eyes: No scleral icterus.  Neck: Normal range of motion. Neck supple.  Cardiovascular: Normal rate and normal heart sounds.   No murmur heard. Irregular rhythm  Pulmonary/Chest: Effort normal and breath sounds normal. She has no wheezes. She has no rales.  Abdominal: Soft. Bowel sounds are normal. She exhibits no distension. There is no tenderness. There is no rebound.  No right upper quadrant abdominal pain noted.  Neurological: She is alert and oriented to person, place, and time.  Skin: Skin is warm and dry.  Vitals reviewed. Assessment   gallbladder polyp, no evidence of biliary colic.  Plan   Patient does not have an indication for laparoscopic cholecystectomy at this time. She does not have classic symptoms for biliary colic. She has at increased risk for surgical intervention given her cardiac issues. She understands this and agrees. Follow-up as needed.

## 2017-02-03 DIAGNOSIS — Z7901 Long term (current) use of anticoagulants: Secondary | ICD-10-CM | POA: Diagnosis not present

## 2017-02-10 ENCOUNTER — Telehealth: Payer: Self-pay | Admitting: Cardiology

## 2017-02-10 ENCOUNTER — Other Ambulatory Visit: Payer: Self-pay | Admitting: Cardiovascular Disease

## 2017-02-10 ENCOUNTER — Encounter: Payer: Medicare Other | Admitting: *Deleted

## 2017-02-10 NOTE — Telephone Encounter (Signed)
LMOVM reminding pt to send remote transmission.   

## 2017-02-11 ENCOUNTER — Ambulatory Visit (INDEPENDENT_AMBULATORY_CARE_PROVIDER_SITE_OTHER): Payer: Medicare Other | Admitting: *Deleted

## 2017-02-11 ENCOUNTER — Encounter: Payer: Self-pay | Admitting: Cardiology

## 2017-02-11 DIAGNOSIS — I495 Sick sinus syndrome: Secondary | ICD-10-CM | POA: Diagnosis not present

## 2017-02-14 NOTE — Progress Notes (Signed)
Remote pacemaker transmission.   

## 2017-02-16 ENCOUNTER — Encounter: Payer: Self-pay | Admitting: Cardiology

## 2017-02-17 LAB — CUP PACEART REMOTE DEVICE CHECK
Battery Impedance: 208 Ohm
Battery Voltage: 2.79 V
Brady Statistic AP VP Percent: 0 %
Brady Statistic AP VS Percent: 62 %
Brady Statistic AS VP Percent: 0 %
Brady Statistic AS VS Percent: 37 %
Implantable Lead Implant Date: 20040827
Implantable Lead Location: 753859
Implantable Pulse Generator Implant Date: 20140519
Lead Channel Impedance Value: 961 Ohm
Lead Channel Pacing Threshold Amplitude: 0.375 V
Lead Channel Pacing Threshold Pulse Width: 0.4 ms
Lead Channel Setting Pacing Amplitude: 2.5 V
Lead Channel Setting Pacing Pulse Width: 0.4 ms
Lead Channel Setting Sensing Sensitivity: 2.8 mV
MDC IDC LEAD IMPLANT DT: 20040827
MDC IDC LEAD LOCATION: 753860
MDC IDC MSMT BATTERY REMAINING LONGEVITY: 125 mo
MDC IDC MSMT LEADCHNL RA IMPEDANCE VALUE: 467 Ohm
MDC IDC MSMT LEADCHNL RV PACING THRESHOLD AMPLITUDE: 1.75 V
MDC IDC MSMT LEADCHNL RV PACING THRESHOLD PULSEWIDTH: 0.4 ms
MDC IDC SESS DTM: 20180518150252
MDC IDC SET LEADCHNL RA PACING AMPLITUDE: 2 V

## 2017-02-22 DIAGNOSIS — E039 Hypothyroidism, unspecified: Secondary | ICD-10-CM | POA: Diagnosis not present

## 2017-02-22 DIAGNOSIS — E782 Mixed hyperlipidemia: Secondary | ICD-10-CM | POA: Diagnosis not present

## 2017-02-22 DIAGNOSIS — G2581 Restless legs syndrome: Secondary | ICD-10-CM | POA: Diagnosis not present

## 2017-02-22 DIAGNOSIS — I10 Essential (primary) hypertension: Secondary | ICD-10-CM | POA: Diagnosis not present

## 2017-02-22 DIAGNOSIS — I482 Chronic atrial fibrillation: Secondary | ICD-10-CM | POA: Diagnosis not present

## 2017-03-09 DIAGNOSIS — I482 Chronic atrial fibrillation: Secondary | ICD-10-CM | POA: Diagnosis not present

## 2017-03-23 DIAGNOSIS — C4491 Basal cell carcinoma of skin, unspecified: Secondary | ICD-10-CM | POA: Diagnosis not present

## 2017-04-05 DIAGNOSIS — I482 Chronic atrial fibrillation: Secondary | ICD-10-CM | POA: Diagnosis not present

## 2017-04-09 DIAGNOSIS — C44319 Basal cell carcinoma of skin of other parts of face: Secondary | ICD-10-CM | POA: Diagnosis not present

## 2017-04-18 ENCOUNTER — Encounter: Payer: Self-pay | Admitting: "Endocrinology

## 2017-04-18 ENCOUNTER — Ambulatory Visit (INDEPENDENT_AMBULATORY_CARE_PROVIDER_SITE_OTHER): Payer: Medicare Other | Admitting: "Endocrinology

## 2017-04-18 DIAGNOSIS — E210 Primary hyperparathyroidism: Secondary | ICD-10-CM | POA: Diagnosis not present

## 2017-04-18 DIAGNOSIS — E038 Other specified hypothyroidism: Secondary | ICD-10-CM | POA: Diagnosis not present

## 2017-04-18 MED ORDER — CINACALCET HCL 30 MG PO TABS: ORAL_TABLET | ORAL | 4 refills | Status: DC

## 2017-04-18 NOTE — Progress Notes (Signed)
Subjective:    Patient ID: Melanie [name], female    DOB: [birth year], PCP Jeri Modena   Past Medical History:  Diagnosis Date  . CAD (coronary artery disease)    Two vessel, quiescent; 70% LAD aretery disease and well collateralized, 100% RCA by cardiac cath in 2004; NL LVF; low risk Cardioloite; EF 74%, 11/10  . Dyslipidemia   . Hypercalcemia   . Hypertension   . Hypothyroidism   . Paroxysmal atrial fibrillation (HCC)    on coumadin and amiodarone  . Sinus node dysfunction (HCC)    s/p Medtronic dual-chamber pacemaker  . Vertigo    Past Surgical History:  Procedure Laterality Date  . ABDOMINAL HYSTERECTOMY    . FRACTURE SURGERY    . PACEMAKER GENERATOR CHANGE N/A 02/12/2013   Procedure: PACEMAKER GENERATOR CHANGE;  Surgeon: Deboraha Sprang, MD;  Location: [facility] CATH LAB;  Service: Cardiovascular;  Laterality: N/A;  . PACEMAKER INSERTION  05/24/03   MDT Kappa   Social History   Social History  . Marital status: Single    Spouse name: N/A  . Number of children: N/A  . Years of education: N/A   Social History Main Topics  . Smoking status: Never Smoker  . Smokeless tobacco: Never Used  . Alcohol use No  . Drug use: No  . Sexual activity: Not Asked   Other Topics Concern  . None   Social History Narrative  . None   Outpatient Encounter Prescriptions as of 04/18/2017  Medication Sig  . amiodarone (PACERONE) 200 MG tablet TAKE 1/2 OF A TABLET BY MOUTH EVERY DAY  . amLODipine (NORVASC) 5 MG tablet Take 1 tablet (5 mg total) by mouth daily.  . Cholecalciferol (VITAMIN D-3) 5000 units TABS TAKE 1 CAPSULE BY MOUTH EVERY DAY  . cinacalcet (SENSIPAR) 30 MG tablet Take One tablet  By mouth every other day.  . clonazePAM (KLONOPIN) 0.5 MG tablet Take 0.5 mg by mouth at bedtime. For restless leg syndrome  . ezetimibe (ZETIA) 10 MG tablet Take 10 mg by mouth daily.  Marland Kitchen HYDROcodone-acetaminophen (NORCO/VICODIN) 5-325 MG tablet Take 1 tablet by mouth every 4 (four) hours  as needed.  . isosorbide mononitrate (IMDUR) 30 MG 24 hr tablet Take 15 mg by mouth every morning.   . Levothyroxine Sodium 100 MCG CAPS Take 100 mcg by mouth daily before breakfast.   . metoprolol tartrate (LOPRESSOR) 25 MG tablet Take 0.5 tablets (12.5 mg total) by mouth 2 (two) times daily.  . ondansetron (ZOFRAN) 4 MG tablet Take 1 tablet (4 mg total) by mouth every 6 (six) hours.  Marland Kitchen warfarin (COUMADIN) 2 MG tablet Take 2 mg by mouth. TAKE AS DIRECTED BY PMD  . [DISCONTINUED] cinacalcet (SENSIPAR) 30 MG tablet Take 1 tablet (30 mg total) by mouth daily with breakfast.   No facility-administered encounter medications on file as of 04/18/2017.    ALLERGIES: Allergies  Allergen Reactions  . Ciprofloxacin Other (See Comments)    Unknown   . Hctz [Hydrochlorothiazide] Other (See Comments)    Unknown   . Lopid [Gemfibrozil] Other (See Comments)    Unknown      VACCINATION STATUS:  There is no immunization history on file for this patient.  HPI 83 year old female patient with medical history as above. She is being seen in f/u  for hypercalcemia and hypothyroidism requested by her PCP [facility], PA-C. - History is obtained from the patient, she is accompanied by her younger sister. - She sees she "always  had calcium problem", although she never  Has required  therapy. - She denies history of nephrolithiasis, seizure disorders. She was found to have calcium 10.7 and August 2017 and 10.8 on 10/18/2016. - Her repeat labs show PTH high at 106 along with high calcium of 10.6. - She was also recently diagnosed with atrial fibrillation-currently wearing pacemaker. She is not sure if she had bone density done in the past. She had mild renal insufficiency. - She denies taking over-the-counter calcium supplements at least in recent years. -At baseline, she is ambulatory using a cane. - She has taken thyroid hormone for the last 15 years. Currently her doses of levothyroxine 100 g by mouth  every morning. She is compliant. - She is taking warfarin 2 mg every day except Mondays. -  During her last visit I have initiated Sensipar therapy 30 mg by mouth daily  To which she has responded with calcium of 8.3.   Review of Systems  Constitutional: + steady weight  , + fatigue, no subjective hyperthermia, no subjective hypothermia Eyes: no blurry vision, no xerophthalmia ENT: no sore throat, no nodules palpated in throat, no dysphagia/odynophagia, no hoarseness Cardiovascular: no Chest Pain, no Shortness of Breath, no palpitations, no leg swelling Respiratory: no cough, no SOB Gastrointestinal: no Nausea/Vomiting/Diarhhea Musculoskeletal: no muscle/joint aches Skin: + Easy bruising, no rashes Neurological: no tremors, no numbness, no tingling, no dizziness Psychiatric: no depression, no anxiety  Objective:    BP 138/72   Pulse 82   Ht '5\' 3"'  (1.6 m)   Wt 148 lb (67.1 kg)   BMI 26.22 kg/m   Wt Readings from Last 3 Encounters:  04/18/17 148 lb (67.1 kg)  02/01/17 146 lb (66.2 kg)  01/26/17 145 lb (65.8 kg)    Physical Exam  Constitutional:  Appropriate weight for hight, not in acute distress, normal state of mind Eyes: PERRLA, EOMI, no exophthalmos ENT: moist mucous membranes, no thyromegaly, no cervical lymphadenopathy Cardiovascular: normal precordial activity, Regular Rate and Rhythm, no Murmur/Rubs/Gallops Respiratory:  adequate breathing efforts, no gross chest deformity, Clear to auscultation bilaterally Gastrointestinal: abdomen soft, Non -tender, No distension, Bowel Sounds present Musculoskeletal: + Diffuse arthritic changes in her bilateral upper extremities,  strength intact in all four extremities Skin: moist, warm, no rashes Neurological: no tremor with outstretched hands, Deep tendon reflexes normal in all four extremities.   Recent Results (from the past 2160 hour(s))  CBC with Differential     Status: None   Collection Time: 01/26/17  9:19 AM  Result  Value Ref Range   WBC 5.3 4.0 - 10.5 K/uL   RBC 4.38 3.87 - 5.11 MIL/uL   Hemoglobin 13.2 12.0 - 15.0 g/dL   HCT 40.2 36.0 - 46.0 %   MCV 91.8 78.0 - 100.0 fL   MCH 30.1 26.0 - 34.0 pg   MCHC 32.8 30.0 - 36.0 g/dL   RDW 13.8 11.5 - 15.5 %   Platelets 192 150 - 400 K/uL   Neutrophils Relative % 42 %   Neutro Abs 2.3 1.7 - 7.7 K/uL   Lymphocytes Relative 37 %   Lymphs Abs 2.0 0.7 - 4.0 K/uL   Monocytes Relative 18 %   Monocytes Absolute 1.0 0.1 - 1.0 K/uL   Eosinophils Relative 2 %   Eosinophils Absolute 0.1 0.0 - 0.7 K/uL   Basophils Relative 1 %   Basophils Absolute 0.0 0.0 - 0.1 K/uL  Lipase, blood     Status: None   Collection Time: 01/26/17  9:19  AM  Result Value Ref Range   Lipase 26 11 - 51 U/L  Comprehensive metabolic panel     Status: Abnormal   Collection Time: 01/26/17  9:19 AM  Result Value Ref Range   Sodium 139 135 - 145 mmol/L   Potassium 4.3 3.5 - 5.1 mmol/L   Chloride 101 101 - 111 mmol/L   CO2 30 22 - 32 mmol/L   Glucose, Bld 95 65 - 99 mg/dL   BUN 17 6 - 20 mg/dL   Creatinine, Ser 1.14 (H) 0.44 - 1.00 mg/dL   Calcium 9.4 8.9 - 10.3 mg/dL   Total Protein 8.0 6.5 - 8.1 g/dL   Albumin 4.2 3.5 - 5.0 g/dL   AST 33 15 - 41 U/L   ALT 23 14 - 54 U/L   Alkaline Phosphatase 107 38 - 126 U/L   Total Bilirubin 0.5 0.3 - 1.2 mg/dL   GFR calc non Af Amer 42 (L) >60 mL/min   GFR calc Af Amer 48 (L) >60 mL/min    Comment: (NOTE) The eGFR has been calculated using the CKD EPI equation. This calculation has not been validated in all clinical situations. eGFR's persistently <60 mL/min signify possible Chronic Kidney Disease.    Anion gap 8 5 - 15  CUP PACEART REMOTE DEVICE CHECK     Status: None   Collection Time: 02/11/17  3:02 PM  Result Value Ref Range   Date Time Interrogation Session [id number]    Pulse Generator Manufacturer MERM    Pulse Gen Model ADDRL1 Adapta    Pulse Gen Serial Number [id number]    Clinic Name [facility]    Implantable  Pulse Generator Type Implantable Pulse Generator    Implantable Pulse Generator Implant Date 53614431    Implantable Lead Manufacturer MERM    Implantable Lead Model 4092 CapSure SP Novus    Implantable Lead Serial Number [id number]    Implantable Lead Implant Date [id number]    Implantable Lead Location [id number]    Implantable Lead Manufacturer [facility]    Implantable Lead Model 1642T IsoFlex S    Implantable Lead Serial Number [id number]    Implantable Lead Implant Date 19509326    Implantable Lead Location [id number]    Lead Channel Setting Sensing Sensitivity 2.80 mV   Lead Channel Setting Pacing Amplitude 2.000 V   Lead Channel Setting Pacing Pulse Width 0.40 ms   Lead Channel Setting Pacing Amplitude 2.500 V   Lead Channel Impedance Value 467 ohm   Lead Channel Pacing Threshold Amplitude 0.375 V   Lead Channel Pacing Threshold Pulse Width 0.40 ms   Lead Channel Impedance Value 961 ohm   Lead Channel Pacing Threshold Amplitude 1.750 V   Lead Channel Pacing Threshold Pulse Width 0.40 ms   Battery Status OK    Battery Remaining Longevity 125 mo   Battery Voltage 2.79 V   Battery Impedance 208 ohm   Brady Statistic AP VP Percent 0 %   Brady Statistic AS VP Percent 0 %   Brady Statistic AP VS Percent 62 %   Brady Statistic AS VS Percent 37 %   Eval Rhythm Ap/As/VS       Diabetic Labs (most recent): Lab Results  Component Value Date   HGBA1C 5.6 06/14/2012   04/30/2016 calcium 10.7, general 22nd 2018 calcium 10.8; TSH from 10/18/2016 was 2.09   Assessment & Plan:   1. Hypercalcemia Due to primary hyperparathyroidism - she is not a surgical candidate.  She is responding to [employer]  therapy.   Since her calcium is   Improved to  8.3,  I will lower Sensipar therapy- to 30 mg by mouth  Every other day. - She will need bone density to rule out osteoporosis. - I will add vitamin D 3 5000 units daily for the next 90 days.  2. Other specified hypothyroidism - Her  thyroid function tests  are consistent with appropriate replacement. -  I advised her to continue levothyroxine 100 g by mouth every morning.  - We discussed about correct intake of levothyroxine, at fasting, with water, separated by at least 30 minutes from breakfast, and separated by more than 4 hours from calcium, iron, multivitamins, acid reflux medications (PPIs). -Patient is made aware of the fact that thyroid hormone replacement is needed for life, dose to be adjusted by periodic monitoring of thyroid function tests.  - I advised patient to maintain close follow up with Tawni Carnes, PA-C for primary care needs. Follow up plan: Return in about 4 months (around 08/19/2017) for follow up with pre-visit labs, follow up with Bone Density.  Glade Lloyd, MD Phone: [phone]  Fax: [phone]   04/18/2017, 4:14 PM

## 2017-04-22 DIAGNOSIS — R197 Diarrhea, unspecified: Secondary | ICD-10-CM | POA: Diagnosis not present

## 2017-04-22 DIAGNOSIS — C44319 Basal cell carcinoma of skin of other parts of face: Secondary | ICD-10-CM | POA: Diagnosis not present

## 2017-04-29 ENCOUNTER — Encounter: Payer: Self-pay | Admitting: Internal Medicine

## 2017-04-29 ENCOUNTER — Ambulatory Visit (INDEPENDENT_AMBULATORY_CARE_PROVIDER_SITE_OTHER): Payer: Medicare Other | Admitting: Internal Medicine

## 2017-04-29 VITALS — BP 118/70 | HR 75 | Ht 63.0 in | Wt 148.4 lb

## 2017-04-29 DIAGNOSIS — I480 Paroxysmal atrial fibrillation: Secondary | ICD-10-CM | POA: Diagnosis not present

## 2017-04-29 DIAGNOSIS — I495 Sick sinus syndrome: Secondary | ICD-10-CM

## 2017-04-29 LAB — CUP PACEART INCLINIC DEVICE CHECK
Brady Statistic AP VP Percent: 0 %
Brady Statistic AP VS Percent: 65 %
Brady Statistic AS VP Percent: 0 %
Brady Statistic AS VS Percent: 34 %
Implantable Lead Implant Date: 20040827
Implantable Lead Location: 753860
Implantable Lead Model: 4092
Lead Channel Impedance Value: 495 Ohm
Lead Channel Pacing Threshold Amplitude: 0.375 V
Lead Channel Pacing Threshold Amplitude: 0.5 V
Lead Channel Pacing Threshold Amplitude: 1 V
Lead Channel Pacing Threshold Pulse Width: 0.4 ms
Lead Channel Pacing Threshold Pulse Width: 0.4 ms
Lead Channel Sensing Intrinsic Amplitude: 1.4 mV
Lead Channel Setting Pacing Amplitude: 2.5 V
Lead Channel Setting Pacing Pulse Width: 0.4 ms
MDC IDC LEAD IMPLANT DT: 20040827
MDC IDC LEAD LOCATION: 753859
MDC IDC MSMT BATTERY IMPEDANCE: 208 Ohm
MDC IDC MSMT BATTERY REMAINING LONGEVITY: 124 mo
MDC IDC MSMT BATTERY VOLTAGE: 2.79 V
MDC IDC MSMT LEADCHNL RA PACING THRESHOLD PULSEWIDTH: 0.4 ms
MDC IDC MSMT LEADCHNL RA PACING THRESHOLD PULSEWIDTH: 0.4 ms
MDC IDC MSMT LEADCHNL RV IMPEDANCE VALUE: 813 Ohm
MDC IDC MSMT LEADCHNL RV PACING THRESHOLD AMPLITUDE: 1.375 V
MDC IDC MSMT LEADCHNL RV SENSING INTR AMPL: 11.2 mV
MDC IDC PG IMPLANT DT: 20140519
MDC IDC SESS DTM: 20180803122839
MDC IDC SET LEADCHNL RA PACING AMPLITUDE: 2 V
MDC IDC SET LEADCHNL RV SENSING SENSITIVITY: 4 mV

## 2017-04-29 NOTE — Patient Instructions (Signed)
Medication Instructions:  Continue all current medications.  Labwork: noine  Testing/Procedures: none  Follow-Up: Your physician wants you to follow up in:  1 year.  You will receive a reminder letter in the mail one-two months in advance.  If you don't receive a letter, please call our office to schedule the follow up appointment.  (DR. ALLRED)  Any Other Special Instructions Will Be Listed Below (If Applicable). Remote monitoring is used to monitor your Pacemaker of ICD from home. This monitoring reduces the number of office visits required to check your device to one time per year. It allows [place] to keep an eye on the functioning of your device to ensure it is working properly. You are scheduled for a device check from home on 05-16-2017. You may send your transmission at any time that day. If you have a wireless device, the transmission will be sent automatically. After your physician reviews your transmission, you will receive a postcard with your next transmission date.  If you need a refill on your cardiac medications before your next appointment, please call your pharmacy.

## 2017-04-29 NOTE — Progress Notes (Signed)
PCP: Tawni Carnes, PA-C Primary Cardiologist:  Dr Bronson Ing Primary EP:  Dr Rayann Heman  Melanie [name] is a 83 y.o. female who presents today for routine electrophysiology followup.  Since last being seen in our clinic, the patient reports doing very well.  She continues to drive and is active.  She had occasional palpitations earlier this year which have resolved.  She was remodeling and moving and thinks that this may have contributed.  Today, she denies symptoms of palpitations, chest pain, shortness of breath,  lower extremity edema, dizziness, presyncope, or syncope.  The patient is otherwise without complaint today.   Past Medical History:  Diagnosis Date  . CAD (coronary artery disease)    Two vessel, quiescent; 70% LAD aretery disease and well collateralized, 100% RCA by cardiac cath in 2004; NL LVF; low risk Cardioloite; EF 74%, 11/10  . Dyslipidemia   . Hypercalcemia   . Hypertension   . Hypothyroidism   . Paroxysmal atrial fibrillation (HCC)    on coumadin and amiodarone  . Sinus node dysfunction (HCC)    s/p Medtronic dual-chamber pacemaker  . Vertigo    Past Surgical History:  Procedure Laterality Date  . ABDOMINAL HYSTERECTOMY    . FRACTURE SURGERY    . PACEMAKER GENERATOR CHANGE N/A 02/12/2013   Procedure: PACEMAKER GENERATOR CHANGE;  Surgeon: Deboraha Sprang, MD;  Location: [facility] CATH LAB;  Service: Cardiovascular;  Laterality: N/A;  . PACEMAKER INSERTION  05/24/03   MDT Kappa    ROS- all systems are reviewed and negative except as per HPI above  Current Outpatient Prescriptions  Medication Sig Dispense Refill  . amiodarone (PACERONE) 200 MG tablet TAKE 1/2 OF A TABLET BY MOUTH EVERY DAY 45 tablet 3  . amLODipine (NORVASC) 5 MG tablet Take 1 tablet (5 mg total) by mouth daily. 90 tablet 3  . cinacalcet (SENSIPAR) 30 MG tablet Take One tablet  By mouth every other day. 15 tablet 4  . clonazePAM (KLONOPIN) 0.5 MG tablet Take 0.5 mg by mouth at bedtime. For restless  leg syndrome    . ezetimibe (ZETIA) 10 MG tablet Take 10 mg by mouth daily.    . isosorbide mononitrate (IMDUR) 30 MG 24 hr tablet Take 15 mg by mouth every morning.     . Levothyroxine Sodium 100 MCG CAPS Take 100 mcg by mouth daily before breakfast.     . metoprolol tartrate (LOPRESSOR) 25 MG tablet Take 0.5 tablets (12.5 mg total) by mouth 2 (two) times daily. 45 tablet 2  . warfarin (COUMADIN) 2 MG tablet Take 2 mg by mouth. TAKE AS DIRECTED BY PMD     No current facility-administered medications for this visit.     Physical Exam: Vitals:   04/29/17 1126  BP: 118/70  Pulse: 75  SpO2: 97%  Weight: 148 lb 6.4 oz (67.3 kg)  Height: 5\' 3"  (1.6 m)    GEN- The patient is elderly appearing, alert and oriented x 3 today.   Head- normocephalic, atraumatic Eyes-  Sclera clear, conjunctiva pink Ears- hearing intact Oropharynx- clear Lungs- Clear to ausculation bilaterally, normal work of breathing Chest- pacemaker pocket is well healed Heart- Regular rate and rhythm with rare ectopy, no murmurs, rubs or gallops, PMI not laterally displaced GI- soft, NT, ND, + BS Extremities- no clubbing, cyanosis, or edema  Pacemaker interrogation- reviewed in detail today,  See PACEART report   Assessment and Plan:  1. Symptomatic sinus bradycardia  Normal pacemaker function See Pace Art report No changes  today  2. afib Controlled with amiodarone She did have increased afib episodes March, April and May but appears that this has resolved. Dr Bronson Ing following LFTs/TFTs On anticoagulation for chad2vasc score of at least 5  3. HTN Stable No change required today  carelink Return to see me in a year Follow-up with Dr Bronson Ing as scheduled  Thompson Grayer MD, [facility] 04/29/2017 11:42 AM

## 2017-05-03 ENCOUNTER — Other Ambulatory Visit: Payer: Self-pay | Admitting: "Endocrinology

## 2017-05-03 DIAGNOSIS — I482 Chronic atrial fibrillation: Secondary | ICD-10-CM | POA: Diagnosis not present

## 2017-05-12 ENCOUNTER — Emergency Department (HOSPITAL_COMMUNITY)
Admission: EM | Admit: 2017-05-12 | Discharge: 2017-05-12 | Disposition: A | Discharge status: Home / Self Care | Payer: Medicare Other | Attending: Emergency Medicine | Admitting: Emergency Medicine

## 2017-05-12 ENCOUNTER — Emergency Department (HOSPITAL_COMMUNITY): Payer: Medicare Other

## 2017-05-12 ENCOUNTER — Encounter (HOSPITAL_COMMUNITY): Payer: Self-pay | Admitting: Emergency Medicine

## 2017-05-12 DIAGNOSIS — I2510 Atherosclerotic heart disease of native coronary artery without angina pectoris: Secondary | ICD-10-CM | POA: Insufficient documentation

## 2017-05-12 DIAGNOSIS — E039 Hypothyroidism, unspecified: Secondary | ICD-10-CM | POA: Diagnosis not present

## 2017-05-12 DIAGNOSIS — Z79899 Other long term (current) drug therapy: Secondary | ICD-10-CM | POA: Insufficient documentation

## 2017-05-12 DIAGNOSIS — R51 Headache: Secondary | ICD-10-CM | POA: Diagnosis not present

## 2017-05-12 DIAGNOSIS — Z7901 Long term (current) use of anticoagulants: Secondary | ICD-10-CM | POA: Insufficient documentation

## 2017-05-12 DIAGNOSIS — R519 Headache, unspecified: Secondary | ICD-10-CM

## 2017-05-12 LAB — CBC WITH DIFFERENTIAL/PLATELET
BASOS ABS: 0 10*3/uL (ref 0.0–0.1)
Basophils Relative: 0 %
EOS ABS: 0.1 10*3/uL (ref 0.0–0.7)
EOS PCT: 2 %
HCT: 35.7 % — ABNORMAL LOW (ref 36.0–46.0)
Hemoglobin: 12.1 g/dL (ref 12.0–15.0)
LYMPHS ABS: 2.5 10*3/uL (ref 0.7–4.0)
LYMPHS PCT: 49 %
MCH: 30.7 pg (ref 26.0–34.0)
MCHC: 33.9 g/dL (ref 30.0–36.0)
MCV: 90.6 fL (ref 78.0–100.0)
MONO ABS: 0.5 10*3/uL (ref 0.1–1.0)
Monocytes Relative: 9 %
Neutro Abs: 2.1 10*3/uL (ref 1.7–7.7)
Neutrophils Relative %: 40 %
PLATELETS: 148 10*3/uL — AB (ref 150–400)
RBC: 3.94 MIL/uL (ref 3.87–5.11)
RDW: 13.9 % (ref 11.5–15.5)
WBC: 5.1 10*3/uL (ref 4.0–10.5)

## 2017-05-12 LAB — BASIC METABOLIC PANEL
Anion gap: 8 (ref 5–15)
BUN: 20 mg/dL (ref 6–20)
CO2: 26 mmol/L (ref 22–32)
CREATININE: 1.1 mg/dL — AB (ref 0.44–1.00)
Calcium: 9.3 mg/dL (ref 8.9–10.3)
Chloride: 104 mmol/L (ref 101–111)
GFR calc Af Amer: 50 mL/min — ABNORMAL LOW (ref >60)
GFR, EST NON AFRICAN AMERICAN: 43 mL/min — AB (ref >60)
GLUCOSE: 104 mg/dL — AB (ref 65–99)
POTASSIUM: 4.5 mmol/L (ref 3.5–5.1)
SODIUM: 138 mmol/L (ref 135–145)

## 2017-05-12 LAB — SEDIMENTATION RATE: SED RATE: 35 mm/h — AB (ref 0–22)

## 2017-05-12 NOTE — Discharge Instructions (Signed)
Tylenol 650 mg rotated with ibuprofen 400 mg every 4 hours as needed for pain.  Follow up with your primary Dr. if not improving in the next week, and return to the ER if symptoms significantly worsen or change.

## 2017-05-12 NOTE — ED Triage Notes (Signed)
Pt c/o pain and pressure to right side of head since 0430 this am. Pt states it hurts worse when she lays on her right side.

## 2017-05-12 NOTE — ED Provider Notes (Signed)
[facility] DEPT Provider Note   CSN: [id number] Arrival date & time: 05/12/17  1333     History   Chief Complaint Chief Complaint  Patient presents with  . Headache    HPI Melanie [name] is a 83 y.o. female.  Patient is an 83 year old female with past medical history of coronary artery disease, hypertension, paroxysmal A. fib for evaluation of headache. She reports about one month of pain to her right temple that is worse when she lies down on her right side. She denies any fevers or chills. She denies any injury or trauma. She denies any visual disturbances. She describes her pain as a throbbing.   The history is provided by the patient.  Headache   This is a new problem. Episode onset: One month ago. The problem occurs constantly. The problem has been gradually worsening. The headache is associated with nothing. The pain is located in the temporal region. The quality of the pain is described as throbbing. The pain is moderate. The pain does not radiate. Pertinent negatives include no fever and no nausea. She has tried nothing for the symptoms.    Past Medical History:  Diagnosis Date  . CAD (coronary artery disease)    Two vessel, quiescent; 70% LAD aretery disease and well collateralized, 100% RCA by cardiac cath in 2004; NL LVF; low risk Cardioloite; EF 74%, 11/10  . Dyslipidemia   . Hypercalcemia   . Hypertension   . Hypothyroidism   . Paroxysmal atrial fibrillation (HCC)    on coumadin and amiodarone  . Sinus node dysfunction (HCC)    s/p Medtronic dual-chamber pacemaker  . Vertigo     Patient Active Problem List   Diagnosis Date Noted  . Hyperparathyroidism, primary ([place]) 12/16/2016  . Femur fracture, right ([place]) 06/11/2012  . Anemia 08/25/2011  . Long term current use of anticoagulant 12/12/2010  . Hypothyroidism 08/12/2009  . HYPERCALCEMIA 08/12/2009  . Sinoatrial node dysfunction ([employer]) 08/12/2009  . DYSPNEA ON EXERTION 08/12/2009  . PACEMAKER-Medtronic  08/12/2009  . HYPERLIPIDEMIA-MIXED 07/11/2008  . HYPERTENSION, BENIGN 07/11/2008  . CAD, NATIVE VESSEL 07/11/2008  . ATRIAL FIBRILLATION, PAROXYSMAL 07/11/2008    Past Surgical History:  Procedure Laterality Date  . ABDOMINAL HYSTERECTOMY    . FRACTURE SURGERY    . PACEMAKER GENERATOR CHANGE N/A 02/12/2013   Procedure: PACEMAKER GENERATOR CHANGE;  Surgeon: Deboraha Sprang, MD;  Location: [facility] CATH LAB;  Service: Cardiovascular;  Laterality: N/A;  . PACEMAKER INSERTION  05/24/03   MDT Kappa    OB History    No data available       Home Medications    Prior to Admission medications   Medication Sig Start Date End Date Taking? Authorizing Provider  amiodarone (PACERONE) 200 MG tablet TAKE 1/2 OF A TABLET BY MOUTH EVERY DAY 02/10/17  Yes Herminio Commons, MD  amLODipine (NORVASC) 5 MG tablet Take 1 tablet (5 mg total) by mouth daily. 11/25/16 04/29/18 Yes Herminio Commons, MD  clonazePAM (KLONOPIN) 0.5 MG tablet Take 0.5 mg by mouth at bedtime. For restless leg syndrome   Yes [provider]  ezetimibe (ZETIA) 10 MG tablet Take 10 mg by mouth daily.   Yes [provider]  isosorbide mononitrate (IMDUR) 30 MG 24 hr tablet Take 15 mg by mouth every morning.    Yes [provider]  Levothyroxine Sodium 100 MCG CAPS Take 100 mcg by mouth daily before breakfast.    Yes [provider]  metoprolol tartrate (LOPRESSOR) 25 MG  tablet Take 0.5 tablets (12.5 mg total) by mouth 2 (two) times daily. 01/19/17  Yes Herminio Commons, MD  SENSIPAR 30 MG tablet TAKE 1 TABLET BY MOUTH EVERY DAY WITH BREAKFAST Patient taking differently: take 1 tablet by mouth every other day 05/03/17  Yes Nida, Marella Chimes, MD  warfarin (COUMADIN) 2 MG tablet Take 2 mg by mouth. Take 2mg  every day except Monday; skip Mondays - or take as directed by MD   Yes [provider]    Family History Family History  Problem Relation Age of Onset  . Hypertension Neg Hx   .  Diabetes Neg Hx   . Coronary artery disease Neg Hx     Social History Social History  Substance Use Topics  . Smoking status: Never Smoker  . Smokeless tobacco: Never Used  . Alcohol use No     Allergies   Ciprofloxacin; Hctz [hydrochlorothiazide]; and Lopid [gemfibrozil]   Review of Systems Review of Systems  Constitutional: Negative for fever.  Gastrointestinal: Negative for nausea.  Neurological: Positive for headaches.  All other systems reviewed and are negative.    Physical Exam Updated Vital Signs BP (!) 154/81   Pulse 70   Temp 98.2 F (36.8 C)   Resp 18   Ht 5\' 3"  (1.6 m)   Wt 67.1 kg (148 lb)   SpO2 96%   BMI 26.22 kg/m   Physical Exam  Constitutional: She is oriented to person, place, and time. She appears well-developed and well-nourished. No distress.  HENT:  Head: Normocephalic and atraumatic.  Mouth/Throat: Oropharynx is clear and moist.  TMs are clear bilaterally.  There is mild tenderness to the soft tissues of the right temple.  Eyes: Pupils are equal, round, and reactive to light. EOM are normal.  Neck: Normal range of motion. Neck supple.  Cardiovascular: Normal rate and regular rhythm.  Exam reveals no gallop and no friction rub.   No murmur heard. Pulmonary/Chest: Effort normal and breath sounds normal. No respiratory distress. She has no wheezes.  Abdominal: Soft. Bowel sounds are normal. She exhibits no distension. There is no tenderness.  Musculoskeletal: Normal range of motion.  Neurological: She is alert and oriented to person, place, and time. No cranial nerve deficit. She exhibits normal muscle tone. Coordination normal.  Skin: Skin is warm and dry. She is not diaphoretic.  Nursing note and vitals reviewed.    ED Treatments / Results  Labs (all labs ordered are listed, but only abnormal results are displayed) Labs Reviewed  BASIC METABOLIC PANEL  CBC WITH DIFFERENTIAL/PLATELET  SEDIMENTATION RATE    EKG  EKG  Interpretation None       Radiology No results found.  Procedures Procedures (including critical care time)  Medications Ordered in ED Medications - No data to display   Initial Impression / Assessment and Plan / ED Course  I have reviewed the triage vital signs and the nursing notes.  Pertinent labs & imaging results that were available during my care of the patient were reviewed by me and considered in my medical decision making (see chart for details).  Patient presents with complaints of pain to the right side of her head. She reports headache for the past month, however now tenderness to her scalp since early this morning. Her workup reveals a negative head CT and essentially unremarkable laboratory studies. She does have a sedimentation rate of 35, however normal when adjusted for age.  I have considered, but doubt temporal arteritis. There is no  evidence for stroke or hemorrhage. He is neurologically intact and I believe appropriate for discharge. She is to follow-up with her primary Dr. if not improving in the next several days.  Final Clinical Impressions(s) / ED Diagnoses   Final diagnoses:  None    New Prescriptions New Prescriptions   No medications on file     Veryl Speak, MD 05/12/17 1755

## 2017-05-16 ENCOUNTER — Ambulatory Visit (INDEPENDENT_AMBULATORY_CARE_PROVIDER_SITE_OTHER): Payer: Medicare Other | Admitting: *Deleted

## 2017-05-16 DIAGNOSIS — I495 Sick sinus syndrome: Secondary | ICD-10-CM

## 2017-05-18 LAB — CUP PACEART REMOTE DEVICE CHECK
Battery Impedance: 208 Ohm
Battery Remaining Longevity: 122 mo
Battery Voltage: 2.79 V
Brady Statistic AP VP Percent: 0 %
Brady Statistic AP VS Percent: 78 %
Brady Statistic AS VP Percent: 0 %
Brady Statistic AS VS Percent: 22 %
Implantable Lead Implant Date: 20040827
Implantable Lead Location: 753859
Implantable Lead Model: 4092
Lead Channel Impedance Value: 463 Ohm
Lead Channel Impedance Value: 963 Ohm
Lead Channel Pacing Threshold Amplitude: 0.375 V
Lead Channel Pacing Threshold Pulse Width: 0.4 ms
Lead Channel Setting Pacing Amplitude: 2 V
Lead Channel Setting Pacing Amplitude: 2.5 V
Lead Channel Setting Pacing Pulse Width: 0.4 ms
Lead Channel Setting Sensing Sensitivity: 4 mV
MDC IDC LEAD IMPLANT DT: 20040827
MDC IDC LEAD LOCATION: 753860
MDC IDC MSMT LEADCHNL RV PACING THRESHOLD AMPLITUDE: 1.5 V
MDC IDC MSMT LEADCHNL RV PACING THRESHOLD PULSEWIDTH: 0.4 ms
MDC IDC PG IMPLANT DT: 20140519
MDC IDC SESS DTM: 20180820182622

## 2017-05-18 NOTE — Progress Notes (Signed)
Remote pacemaker transmission.   

## 2017-05-19 DIAGNOSIS — L239 Allergic contact dermatitis, unspecified cause: Secondary | ICD-10-CM | POA: Diagnosis not present

## 2017-05-27 ENCOUNTER — Encounter: Payer: Self-pay | Admitting: Cardiology

## 2017-06-02 DIAGNOSIS — I482 Chronic atrial fibrillation: Secondary | ICD-10-CM | POA: Diagnosis not present

## 2017-06-02 DIAGNOSIS — I480 Paroxysmal atrial fibrillation: Secondary | ICD-10-CM | POA: Diagnosis not present

## 2017-06-29 DIAGNOSIS — I482 Chronic atrial fibrillation: Secondary | ICD-10-CM | POA: Diagnosis not present

## 2017-07-07 ENCOUNTER — Other Ambulatory Visit: Payer: Self-pay | Admitting: Cardiovascular Disease

## 2017-07-27 DIAGNOSIS — I482 Chronic atrial fibrillation: Secondary | ICD-10-CM | POA: Diagnosis not present

## 2017-08-10 DIAGNOSIS — M19032 Primary osteoarthritis, left wrist: Secondary | ICD-10-CM | POA: Diagnosis not present

## 2017-08-15 ENCOUNTER — Ambulatory Visit (INDEPENDENT_AMBULATORY_CARE_PROVIDER_SITE_OTHER): Payer: Medicare Other | Admitting: *Deleted

## 2017-08-15 ENCOUNTER — Telehealth: Payer: Self-pay | Admitting: Cardiology

## 2017-08-15 DIAGNOSIS — I495 Sick sinus syndrome: Secondary | ICD-10-CM

## 2017-08-15 NOTE — Telephone Encounter (Signed)
Spoke with pt and reminded pt of remote transmission that is due today. Pt verbalized understanding.   

## 2017-08-16 ENCOUNTER — Telehealth: Payer: Self-pay | Admitting: Internal Medicine

## 2017-08-16 NOTE — Progress Notes (Signed)
Remote pacemaker transmission.   

## 2017-08-16 NOTE — Telephone Encounter (Signed)
Confirmed that we successfully received remote transmission.

## 2017-08-16 NOTE — Telephone Encounter (Signed)
New message   Patient wants to confirm transmission. Please call  1. Has your device fired? NO  2. Is you device beeping? NO  3. Are you experiencing draining or swelling at device site? NO  4. Are you calling to see if we received your device transmission? YES  5. Have you passed out? NO    Please route to [place]

## 2017-08-17 DIAGNOSIS — R10811 Right upper quadrant abdominal tenderness: Secondary | ICD-10-CM | POA: Diagnosis not present

## 2017-08-17 DIAGNOSIS — M19032 Primary osteoarthritis, left wrist: Secondary | ICD-10-CM | POA: Diagnosis not present

## 2017-08-17 DIAGNOSIS — K219 Gastro-esophageal reflux disease without esophagitis: Secondary | ICD-10-CM | POA: Diagnosis not present

## 2017-08-17 LAB — CUP PACEART REMOTE DEVICE CHECK
Battery Remaining Longevity: 119 mo
Battery Voltage: 2.79 V
Brady Statistic AP VS Percent: 80 %
Brady Statistic AS VS Percent: 19 %
Implantable Lead Implant Date: 20040827
Implantable Lead Location: 753859
Lead Channel Impedance Value: 482 Ohm
Lead Channel Pacing Threshold Amplitude: 1.5 V
Lead Channel Pacing Threshold Pulse Width: 0.4 ms
Lead Channel Pacing Threshold Pulse Width: 0.4 ms
Lead Channel Setting Pacing Amplitude: 2 V
Lead Channel Setting Pacing Pulse Width: 0.4 ms
Lead Channel Setting Sensing Sensitivity: 4 mV
MDC IDC LEAD IMPLANT DT: 20040827
MDC IDC LEAD LOCATION: 753860
MDC IDC MSMT BATTERY IMPEDANCE: 232 Ohm
MDC IDC MSMT LEADCHNL RA PACING THRESHOLD AMPLITUDE: 0.5 V
MDC IDC MSMT LEADCHNL RV IMPEDANCE VALUE: 903 Ohm
MDC IDC PG IMPLANT DT: 20140519
MDC IDC SESS DTM: 20181120144758
MDC IDC SET LEADCHNL RV PACING AMPLITUDE: 2.5 V
MDC IDC STAT BRADY AP VP PERCENT: 0 %
MDC IDC STAT BRADY AS VP PERCENT: 0 %

## 2017-08-22 ENCOUNTER — Ambulatory Visit: Payer: Medicare Other | Admitting: "Endocrinology

## 2017-08-25 ENCOUNTER — Encounter: Payer: Self-pay | Admitting: Cardiology

## 2017-08-29 DIAGNOSIS — I480 Paroxysmal atrial fibrillation: Secondary | ICD-10-CM | POA: Diagnosis not present

## 2017-09-09 DIAGNOSIS — E039 Hypothyroidism, unspecified: Secondary | ICD-10-CM | POA: Diagnosis not present

## 2017-09-09 DIAGNOSIS — I482 Chronic atrial fibrillation: Secondary | ICD-10-CM | POA: Diagnosis not present

## 2017-09-09 DIAGNOSIS — I10 Essential (primary) hypertension: Secondary | ICD-10-CM | POA: Diagnosis not present

## 2017-09-09 DIAGNOSIS — E782 Mixed hyperlipidemia: Secondary | ICD-10-CM | POA: Diagnosis not present

## 2017-09-16 DIAGNOSIS — I482 Chronic atrial fibrillation: Secondary | ICD-10-CM | POA: Diagnosis not present

## 2017-09-23 DIAGNOSIS — R1084 Generalized abdominal pain: Secondary | ICD-10-CM | POA: Diagnosis not present

## 2017-09-23 DIAGNOSIS — I480 Paroxysmal atrial fibrillation: Secondary | ICD-10-CM | POA: Diagnosis not present

## 2017-09-30 DIAGNOSIS — R1084 Generalized abdominal pain: Secondary | ICD-10-CM | POA: Diagnosis not present

## 2017-09-30 DIAGNOSIS — I480 Paroxysmal atrial fibrillation: Secondary | ICD-10-CM | POA: Diagnosis not present

## 2017-10-02 ENCOUNTER — Other Ambulatory Visit: Payer: Self-pay | Admitting: "Endocrinology

## 2017-10-03 DIAGNOSIS — R1084 Generalized abdominal pain: Secondary | ICD-10-CM | POA: Diagnosis not present

## 2017-10-03 DIAGNOSIS — K8020 Calculus of gallbladder without cholecystitis without obstruction: Secondary | ICD-10-CM | POA: Diagnosis not present

## 2017-10-11 DIAGNOSIS — M546 Pain in thoracic spine: Secondary | ICD-10-CM | POA: Diagnosis not present

## 2017-10-11 DIAGNOSIS — K219 Gastro-esophageal reflux disease without esophagitis: Secondary | ICD-10-CM | POA: Diagnosis not present

## 2017-10-11 DIAGNOSIS — I480 Paroxysmal atrial fibrillation: Secondary | ICD-10-CM | POA: Diagnosis not present

## 2017-10-11 DIAGNOSIS — R1084 Generalized abdominal pain: Secondary | ICD-10-CM | POA: Diagnosis not present

## 2017-11-04 DIAGNOSIS — D485 Neoplasm of uncertain behavior of skin: Secondary | ICD-10-CM | POA: Diagnosis not present

## 2017-11-11 DIAGNOSIS — I482 Chronic atrial fibrillation: Secondary | ICD-10-CM | POA: Diagnosis not present

## 2017-11-14 ENCOUNTER — Ambulatory Visit (INDEPENDENT_AMBULATORY_CARE_PROVIDER_SITE_OTHER): Payer: Medicare Other | Admitting: *Deleted

## 2017-11-14 ENCOUNTER — Telehealth: Payer: Self-pay | Admitting: Cardiology

## 2017-11-14 DIAGNOSIS — I495 Sick sinus syndrome: Secondary | ICD-10-CM | POA: Diagnosis not present

## 2017-11-14 NOTE — Telephone Encounter (Signed)
LMOVM reminding pt to send remote transmission.   

## 2017-11-15 LAB — CUP PACEART REMOTE DEVICE CHECK
Brady Statistic AP VS Percent: 77 %
Brady Statistic AS VP Percent: 0 %
Date Time Interrogation Session: 20190219150312
Implantable Lead Implant Date: 20040827
Implantable Lead Location: 753859
Lead Channel Pacing Threshold Amplitude: 0.5 V
Lead Channel Pacing Threshold Amplitude: 1.625 V
Lead Channel Pacing Threshold Pulse Width: 0.4 ms
Lead Channel Pacing Threshold Pulse Width: 0.4 ms
MDC IDC LEAD IMPLANT DT: 20040827
MDC IDC LEAD LOCATION: 753860
MDC IDC MSMT BATTERY IMPEDANCE: 232 Ohm
MDC IDC MSMT BATTERY REMAINING LONGEVITY: 118 mo
MDC IDC MSMT BATTERY VOLTAGE: 2.79 V
MDC IDC MSMT LEADCHNL RA IMPEDANCE VALUE: 469 Ohm
MDC IDC MSMT LEADCHNL RV IMPEDANCE VALUE: 1060 Ohm
MDC IDC PG IMPLANT DT: 20140519
MDC IDC SET LEADCHNL RA PACING AMPLITUDE: 2 V
MDC IDC SET LEADCHNL RV PACING AMPLITUDE: 2.5 V
MDC IDC SET LEADCHNL RV PACING PULSEWIDTH: 0.4 ms
MDC IDC SET LEADCHNL RV SENSING SENSITIVITY: 4 mV
MDC IDC STAT BRADY AP VP PERCENT: 1 %
MDC IDC STAT BRADY AS VS PERCENT: 22 %

## 2017-11-15 NOTE — Progress Notes (Signed)
Remote pacemaker transmission.   

## 2017-11-17 ENCOUNTER — Encounter: Payer: Self-pay | Admitting: Cardiology

## 2017-11-29 ENCOUNTER — Other Ambulatory Visit: Payer: Self-pay | Admitting: Cardiovascular Disease

## 2017-12-08 DIAGNOSIS — Z7901 Long term (current) use of anticoagulants: Secondary | ICD-10-CM | POA: Diagnosis not present

## 2017-12-15 DIAGNOSIS — I482 Chronic atrial fibrillation: Secondary | ICD-10-CM | POA: Diagnosis not present

## 2017-12-21 DIAGNOSIS — C44319 Basal cell carcinoma of skin of other parts of face: Secondary | ICD-10-CM | POA: Diagnosis not present

## 2018-01-17 DIAGNOSIS — I4891 Unspecified atrial fibrillation: Secondary | ICD-10-CM | POA: Diagnosis not present

## 2018-01-17 DIAGNOSIS — Z7901 Long term (current) use of anticoagulants: Secondary | ICD-10-CM | POA: Diagnosis not present

## 2018-01-27 DIAGNOSIS — E039 Hypothyroidism, unspecified: Secondary | ICD-10-CM | POA: Diagnosis not present

## 2018-01-27 DIAGNOSIS — I482 Chronic atrial fibrillation: Secondary | ICD-10-CM | POA: Diagnosis not present

## 2018-01-27 DIAGNOSIS — L719 Rosacea, unspecified: Secondary | ICD-10-CM | POA: Diagnosis not present

## 2018-01-27 DIAGNOSIS — I10 Essential (primary) hypertension: Secondary | ICD-10-CM | POA: Diagnosis not present

## 2018-01-27 DIAGNOSIS — E782 Mixed hyperlipidemia: Secondary | ICD-10-CM | POA: Diagnosis not present

## 2018-01-28 ENCOUNTER — Other Ambulatory Visit: Payer: Self-pay | Admitting: Cardiovascular Disease

## 2018-02-03 DIAGNOSIS — L719 Rosacea, unspecified: Secondary | ICD-10-CM | POA: Diagnosis not present

## 2018-02-13 ENCOUNTER — Telehealth: Payer: Self-pay | Admitting: Cardiology

## 2018-02-13 ENCOUNTER — Ambulatory Visit (INDEPENDENT_AMBULATORY_CARE_PROVIDER_SITE_OTHER): Payer: Medicare Other | Admitting: *Deleted

## 2018-02-13 DIAGNOSIS — I495 Sick sinus syndrome: Secondary | ICD-10-CM | POA: Diagnosis not present

## 2018-02-13 NOTE — Telephone Encounter (Signed)
Spoke with pt and reminded pt of remote transmission that is due today. Pt verbalized understanding.   

## 2018-02-14 ENCOUNTER — Encounter: Payer: Self-pay | Admitting: Cardiology

## 2018-02-14 LAB — CUP PACEART REMOTE DEVICE CHECK
Battery Remaining Longevity: 119 mo
Battery Voltage: 2.79 V
Brady Statistic AP VP Percent: 0 %
Brady Statistic AP VS Percent: 82 %
Brady Statistic AS VP Percent: 0 %
Implantable Lead Implant Date: 20040827
Implantable Lead Location: 753859
Implantable Lead Location: 753860
Implantable Pulse Generator Implant Date: 20140519
Lead Channel Impedance Value: 503 Ohm
Lead Channel Pacing Threshold Amplitude: 0.5 V
Lead Channel Pacing Threshold Amplitude: 1.75 V
Lead Channel Pacing Threshold Pulse Width: 0.4 ms
Lead Channel Pacing Threshold Pulse Width: 0.4 ms
MDC IDC LEAD IMPLANT DT: 20040827
MDC IDC MSMT BATTERY IMPEDANCE: 232 Ohm
MDC IDC MSMT LEADCHNL RV IMPEDANCE VALUE: 849 Ohm
MDC IDC SESS DTM: 20190520211538
MDC IDC SET LEADCHNL RA PACING AMPLITUDE: 2 V
MDC IDC SET LEADCHNL RV PACING AMPLITUDE: 2.5 V
MDC IDC SET LEADCHNL RV PACING PULSEWIDTH: 0.4 ms
MDC IDC SET LEADCHNL RV SENSING SENSITIVITY: 4 mV
MDC IDC STAT BRADY AS VS PERCENT: 18 %

## 2018-02-14 NOTE — Progress Notes (Signed)
Remote pacemaker transmission.   

## 2018-02-16 DIAGNOSIS — Z7901 Long term (current) use of anticoagulants: Secondary | ICD-10-CM | POA: Diagnosis not present

## 2018-02-16 DIAGNOSIS — I4891 Unspecified atrial fibrillation: Secondary | ICD-10-CM | POA: Diagnosis not present

## 2018-03-15 DIAGNOSIS — H6123 Impacted cerumen, bilateral: Secondary | ICD-10-CM | POA: Diagnosis not present

## 2018-03-20 DIAGNOSIS — I4891 Unspecified atrial fibrillation: Secondary | ICD-10-CM | POA: Diagnosis not present

## 2018-03-23 DIAGNOSIS — R1084 Generalized abdominal pain: Secondary | ICD-10-CM | POA: Diagnosis not present

## 2018-03-23 DIAGNOSIS — J019 Acute sinusitis, unspecified: Secondary | ICD-10-CM | POA: Diagnosis not present

## 2018-03-31 DIAGNOSIS — R05 Cough: Secondary | ICD-10-CM | POA: Diagnosis not present

## 2018-03-31 DIAGNOSIS — J019 Acute sinusitis, unspecified: Secondary | ICD-10-CM | POA: Diagnosis not present

## 2018-04-19 DIAGNOSIS — I482 Chronic atrial fibrillation: Secondary | ICD-10-CM | POA: Diagnosis not present

## 2018-04-28 ENCOUNTER — Other Ambulatory Visit: Payer: Self-pay | Admitting: Cardiovascular Disease

## 2018-04-28 ENCOUNTER — Encounter: Payer: Self-pay | Admitting: Internal Medicine

## 2018-04-28 ENCOUNTER — Ambulatory Visit (INDEPENDENT_AMBULATORY_CARE_PROVIDER_SITE_OTHER): Payer: Medicare Other | Admitting: Internal Medicine

## 2018-04-28 VITALS — BP 131/77 | HR 80 | Ht 63.0 in | Wt 140.8 lb

## 2018-04-28 DIAGNOSIS — I495 Sick sinus syndrome: Secondary | ICD-10-CM | POA: Diagnosis not present

## 2018-04-28 DIAGNOSIS — I10 Essential (primary) hypertension: Secondary | ICD-10-CM

## 2018-04-28 DIAGNOSIS — Z950 Presence of cardiac pacemaker: Secondary | ICD-10-CM | POA: Diagnosis not present

## 2018-04-28 DIAGNOSIS — I480 Paroxysmal atrial fibrillation: Secondary | ICD-10-CM

## 2018-04-28 NOTE — Patient Instructions (Addendum)
Medication Instructions:  Continue all current medications.  Labwork: none  Testing/Procedures: none  Follow-Up: 1 year   Any Other Special Instructions Will Be Listed Below (If Applicable).  Remote monitoring is used to monitor your Pacemaker of ICD from home. This monitoring reduces the number of office visits required to check your device to one time per year. It allows [place] to keep an eye on the functioning of your device to ensure it is working properly. You are scheduled for a device check from home on 05/15/2018. You may send your transmission at any time that day. If you have a wireless device, the transmission will be sent automatically. After your physician reviews your transmission, you will receive a postcard with your next transmission date.  2 gm low sodium diet - info sheet given today.   If you need a refill on your cardiac medications before your next appointment, please call your pharmacy.

## 2018-04-28 NOTE — Progress Notes (Signed)
PCP: Rosalee Kaufman, PA-C Primary Cardiologist: Dr Bronson Ing Primary EP:  Dr Rayann Heman  Melanie [name] is a 84 y.o. female who presents today for routine electrophysiology followup.  Since last being seen in our clinic, the patient reports doing very well.  Today, she denies symptoms of palpitations, chest pain, shortness of breath,  lower extremity edema, dizziness, presyncope, or syncope.  The patient is otherwise without complaint today.   Past Medical History:  Diagnosis Date  . CAD (coronary artery disease)    Two vessel, quiescent; 70% LAD aretery disease and well collateralized, 100% RCA by cardiac cath in 2004; NL LVF; low risk Cardioloite; EF 74%, 11/10  . Dyslipidemia   . Hypercalcemia   . Hypertension   . Hypothyroidism   . Paroxysmal atrial fibrillation (HCC)    on coumadin and amiodarone  . Sinus node dysfunction (HCC)    s/p Medtronic dual-chamber pacemaker  . Vertigo    Past Surgical History:  Procedure Laterality Date  . ABDOMINAL HYSTERECTOMY    . FRACTURE SURGERY    . PACEMAKER GENERATOR CHANGE N/A 02/12/2013   Procedure: PACEMAKER GENERATOR CHANGE;  Surgeon: Deboraha Sprang, MD;  Location: [facility] CATH LAB;  Service: Cardiovascular;  Laterality: N/A;  . PACEMAKER INSERTION  05/24/03   MDT Kappa    ROS- all systems are reviewed and negative except as per HPI above  Current Outpatient Medications  Medication Sig Dispense Refill  . amiodarone (PACERONE) 200 MG tablet TAKE 1/2 TABLET BY MOUTH EVERY DAY (CUT IN HALF PLEASE) 45 tablet 0  . amLODipine (NORVASC) 5 MG tablet TAKE 1 TABLET BY MOUTH EVERY DAY 90 tablet 3  . clonazePAM (KLONOPIN) 0.5 MG tablet Take 0.5 mg by mouth at bedtime. For restless leg syndrome    . ezetimibe (ZETIA) 10 MG tablet Take 10 mg by mouth daily.    . isosorbide mononitrate (IMDUR) 30 MG 24 hr tablet Take 15 mg by mouth every morning.     . Levothyroxine Sodium 100 MCG CAPS Take 100 mcg by mouth daily before breakfast.     .  metoprolol tartrate (LOPRESSOR) 25 MG tablet TAKE ONE-HALF TABLET BY MOUTH TWICE DAILY (please cut in half) 90 tablet 3  . warfarin (COUMADIN) 2 MG tablet Take 2 mg by mouth. Take 2mg  every day except Monday; skip Mondays - or take as directed by MD     No current facility-administered medications for this visit.     Physical Exam: Vitals:   04/28/18 1008  BP: 131/77  Pulse: 80  SpO2: 96%  Weight: 140 lb 12.8 oz (63.9 kg)  Height: 5\' 3"  (1.6 m)    GEN- The patient is well appearing, alert and oriented x 3 today.   Head- normocephalic, atraumatic Eyes-  Sclera clear, conjunctiva pink Ears- hearing intact Oropharynx- clear Lungs- Clear to ausculation bilaterally, normal work of breathing Chest- pacemaker pocket is well healed Heart- Regular rate and rhythm, no murmurs, rubs or gallops, PMI not laterally displaced GI- soft, NT, ND, + BS Extremities- no clubbing, cyanosis, or edema  Pacemaker interrogation- reviewed in detail today,  See PACEART report   Assessment and Plan:  1. Symptomatic sinus bradycardia  Normal pacemaker function See Pace Art report No changes today  2. afib Controlled with amiodarone (afib burden 3.1%) chads2vasc score is 5.  Continue anticoagulation I have advised that she needs LFTs, TFts, twice per year.  She is very clear that she wishes to have these by PCP.  I will reach  out to Dr Edythe Lynn office for her most recent lab results.  3. HTN Stable No change required today  carelink Return to see me in a year Follow-up with Dr Bronson Ing in 6 months  Thompson Grayer MD, [facility] 04/28/2018 10:46 AM

## 2018-05-15 ENCOUNTER — Ambulatory Visit (INDEPENDENT_AMBULATORY_CARE_PROVIDER_SITE_OTHER): Payer: Medicare Other | Admitting: *Deleted

## 2018-05-15 DIAGNOSIS — I495 Sick sinus syndrome: Secondary | ICD-10-CM | POA: Diagnosis not present

## 2018-05-16 ENCOUNTER — Telehealth: Payer: Self-pay | Admitting: Cardiology

## 2018-05-16 NOTE — Telephone Encounter (Signed)
LMOVM reminding pt to send remote transmission.   

## 2018-05-17 ENCOUNTER — Encounter: Payer: Self-pay | Admitting: Cardiology

## 2018-05-17 NOTE — Progress Notes (Signed)
Remote pacemaker transmission.   

## 2018-05-21 LAB — CUP PACEART INCLINIC DEVICE CHECK
Battery Impedance: 281 Ohm
Battery Voltage: 2.8 V
Brady Statistic AS VS Percent: 16 %
Date Time Interrogation Session: 20190802144534
Implantable Lead Implant Date: 20040827
Implantable Lead Location: 753860
Implantable Lead Model: 4092
Lead Channel Impedance Value: 527 Ohm
Lead Channel Sensing Intrinsic Amplitude: 8 mV
Lead Channel Setting Pacing Amplitude: 2 V
Lead Channel Setting Pacing Amplitude: 2.5 V
MDC IDC LEAD IMPLANT DT: 20040827
MDC IDC LEAD LOCATION: 753859
MDC IDC MSMT BATTERY REMAINING LONGEVITY: 112 mo
MDC IDC MSMT LEADCHNL RA PACING THRESHOLD AMPLITUDE: 0.5 V
MDC IDC MSMT LEADCHNL RA PACING THRESHOLD PULSEWIDTH: 0.4 ms
MDC IDC MSMT LEADCHNL RV IMPEDANCE VALUE: 966 Ohm
MDC IDC MSMT LEADCHNL RV PACING THRESHOLD AMPLITUDE: 0.75 V
MDC IDC MSMT LEADCHNL RV PACING THRESHOLD PULSEWIDTH: 0.4 ms
MDC IDC PG IMPLANT DT: 20140519
MDC IDC SET LEADCHNL RV PACING PULSEWIDTH: 0.4 ms
MDC IDC SET LEADCHNL RV SENSING SENSITIVITY: 4 mV
MDC IDC STAT BRADY AP VP PERCENT: 0 %
MDC IDC STAT BRADY AP VS PERCENT: 83 %
MDC IDC STAT BRADY AS VP PERCENT: 0 %

## 2018-05-22 DIAGNOSIS — I482 Chronic atrial fibrillation: Secondary | ICD-10-CM | POA: Diagnosis not present

## 2018-06-06 ENCOUNTER — Ambulatory Visit (INDEPENDENT_AMBULATORY_CARE_PROVIDER_SITE_OTHER): Payer: Medicare Other | Admitting: Cardiovascular Disease

## 2018-06-06 ENCOUNTER — Encounter: Payer: Self-pay | Admitting: Cardiovascular Disease

## 2018-06-06 VITALS — BP 144/68 | HR 86 | Ht 62.0 in | Wt 141.0 lb

## 2018-06-06 DIAGNOSIS — Z79899 Other long term (current) drug therapy: Secondary | ICD-10-CM

## 2018-06-06 DIAGNOSIS — I25118 Atherosclerotic heart disease of native coronary artery with other forms of angina pectoris: Secondary | ICD-10-CM

## 2018-06-06 DIAGNOSIS — Z950 Presence of cardiac pacemaker: Secondary | ICD-10-CM

## 2018-06-06 DIAGNOSIS — Z7901 Long term (current) use of anticoagulants: Secondary | ICD-10-CM

## 2018-06-06 DIAGNOSIS — E785 Hyperlipidemia, unspecified: Secondary | ICD-10-CM

## 2018-06-06 DIAGNOSIS — I480 Paroxysmal atrial fibrillation: Secondary | ICD-10-CM | POA: Diagnosis not present

## 2018-06-06 DIAGNOSIS — I10 Essential (primary) hypertension: Secondary | ICD-10-CM | POA: Diagnosis not present

## 2018-06-06 NOTE — Progress Notes (Signed)
SUBJECTIVE: The patient presents for routine follow-up.  She has coronary artery disease, paroxysmal atrial fibrillation, and has a pacemaker.  She saw Dr. Rayann Heman in early August 2019 and was doing well at that time.  TSH and LFTs were normal on 03/23/2017.  I will have to request a copy of her most recent report.  She denies chest pain and shortness of breath.  She seldom has palpitations.  She occasionally has symptoms related to GERD alleviated with belching.  She seldom has ankle swelling.    Review of Systems: As per "subjective", otherwise negative.  Allergies  Allergen Reactions  . Ciprofloxacin Other (See Comments)    Unknown; patient states they are unaware of having a reaction  . Hctz [Hydrochlorothiazide] Other (See Comments)    Unknown; patient states they are unaware of having a reaction  . Lopid [Gemfibrozil] Other (See Comments)    Unknown; patient states they are unaware of having a reaction     Current Outpatient Medications  Medication Sig Dispense Refill  . amiodarone (PACERONE) 200 MG tablet TAKE 1/2 TABLET BY MOUTH EVERY DAY (CUT IN HALF PLEASE) 45 tablet 0  . amLODipine (NORVASC) 5 MG tablet TAKE 1 TABLET BY MOUTH EVERY DAY 90 tablet 3  . clonazePAM (KLONOPIN) 0.5 MG tablet Take 0.5 mg by mouth at bedtime. For restless leg syndrome    . ezetimibe (ZETIA) 10 MG tablet Take 10 mg by mouth daily.    . isosorbide mononitrate (IMDUR) 30 MG 24 hr tablet Take 15 mg by mouth every morning.     . Levothyroxine Sodium 100 MCG CAPS Take 100 mcg by mouth daily before breakfast.     . metoprolol tartrate (LOPRESSOR) 25 MG tablet TAKE ONE-HALF TABLET BY MOUTH TWICE DAILY (please cut in half) 90 tablet 3  . warfarin (COUMADIN) 2 MG tablet Take 2 mg by mouth. Take 2mg  every day except Monday; skip Mondays - or take as directed by MD     No current facility-administered medications for this visit.     Past Medical History:  Diagnosis Date  . CAD (coronary artery  disease)    Two vessel, quiescent; 70% LAD aretery disease and well collateralized, 100% RCA by cardiac cath in 2004; NL LVF; low risk Cardioloite; EF 74%, 11/10  . Dyslipidemia   . Hypercalcemia   . Hypertension   . Hypothyroidism   . Paroxysmal atrial fibrillation (HCC)    on coumadin and amiodarone  . Sinus node dysfunction (HCC)    s/p Medtronic dual-chamber pacemaker  . Vertigo     Past Surgical History:  Procedure Laterality Date  . ABDOMINAL HYSTERECTOMY    . FRACTURE SURGERY    . PACEMAKER GENERATOR CHANGE N/A 02/12/2013   Procedure: PACEMAKER GENERATOR CHANGE;  Surgeon: Deboraha Sprang, MD;  Location: [facility] CATH LAB;  Service: Cardiovascular;  Laterality: N/A;  . PACEMAKER INSERTION  05/24/03   MDT Kappa    Social History   Socioeconomic History  . Marital status: Single    Spouse name: Not on file  . Number of children: Not on file  . Years of education: Not on file  . Highest education level: Not on file  Occupational History  . Not on file  Social Needs  . Financial resource strain: Not on file  . Food insecurity:    Worry: Not on file    Inability: Not on file  . Transportation needs:    Medical: Not on file  Non-medical: Not on file  Tobacco Use  . Smoking status: Never Smoker  . Smokeless tobacco: Never Used  Substance and Sexual Activity  . Alcohol use: No  . Drug use: No  . Sexual activity: Not on file  Lifestyle  . Physical activity:    Days per week: Not on file    Minutes per session: Not on file  . Stress: Not on file  Relationships  . Social connections:    Talks on phone: Not on file    Gets together: Not on file    Attends religious service: Not on file    Active member of club or organization: Not on file    Attends meetings of clubs or organizations: Not on file    Relationship status: Not on file  . Intimate partner violence:    Fear of current or ex partner: Not on file    Emotionally abused: Not on file    Physically abused: Not  on file    Forced sexual activity: Not on file  Other Topics Concern  . Not on file  Social History Narrative  . Not on file     Vitals:   06/06/18 1252  BP: (!) 144/68  Pulse: 86  SpO2: 94%  Weight: 141 lb (64 kg)  Height: 5\' 2"  (1.575 m)    Wt Readings from Last 3 Encounters:  06/06/18 141 lb (64 kg)  04/28/18 140 lb 12.8 oz (63.9 kg)  05/12/17 148 lb (67.1 kg)     PHYSICAL EXAM General: NAD HEENT: Normal. Neck: No JVD, no thyromegaly. Lungs: Clear to auscultation bilaterally with normal respiratory effort. CV: Regular rate and rhythm, normal S1/S2, no S3/S4, no murmur. No pretibial or periankle edema.  No carotid bruit.  Bilateral lower extremity venous varicosities. Abdomen: Soft, nontender, no distention.  Neurologic: Alert and oriented.  Psych: Normal affect. Skin: Normal. Musculoskeletal: No gross deformities.    ECG: Reviewed above under Subjective   Labs: Lab Results  Component Value Date/Time   K 4.5 05/12/2017 03:37 PM   BUN 20 05/12/2017 03:37 PM   CREATININE 1.10 (H) 05/12/2017 03:37 PM   CREATININE 1.07 (H) 12/09/2016 09:36 AM   ALT 23 01/26/2017 09:19 AM   TSH 2.60 12/09/2016 09:36 AM   HGB 12.1 05/12/2017 03:37 PM     Lipids: No results found for: LDLCALC, LDLDIRECT, CHOL, TRIG, HDL     ASSESSMENT AND PLAN: 1. CAD: Symptomatically stable on current medical therapy which includes amlodipine, metoprolol, and long-acting nitrates. Not on statins any longer.  2. Essential HTN: Reasonably controlled for age. No changes.  3. Hyperlipidemia: Not on statins.  4. Paroxysmal atrial fibrillation with palpitations: Maintained on amiodarone, metoprolol, and warfarin.  I will contact PCP to obtain most recent thyroid and liver function tests.  5. Pacemaker: Followed by Dr. Rayann Heman.  I reviewed the device interrogation report dated 05/21/2018.  There was 3.1% atrial fibrillation burden.  There were no high ventricular rates.    Disposition:  Follow up 6 months   Kate Sable, M.D., F.A.C.C.

## 2018-06-06 NOTE — Patient Instructions (Signed)

## 2018-06-09 LAB — CUP PACEART REMOTE DEVICE CHECK
Battery Impedance: 281 Ohm
Brady Statistic AP VP Percent: 0 %
Brady Statistic AS VP Percent: 0 %
Brady Statistic AS VS Percent: 1 %
Date Time Interrogation Session: 20190821153122
Implantable Lead Implant Date: 20040827
Implantable Lead Location: 753859
Implantable Lead Model: 4092
Implantable Pulse Generator Implant Date: 20140519
Lead Channel Pacing Threshold Amplitude: 0.375 V
Lead Channel Pacing Threshold Amplitude: 1.375 V
Lead Channel Sensing Intrinsic Amplitude: 8 mV
Lead Channel Setting Pacing Amplitude: 2 V
MDC IDC LEAD IMPLANT DT: 20040827
MDC IDC LEAD LOCATION: 753860
MDC IDC MSMT BATTERY REMAINING LONGEVITY: 110 mo
MDC IDC MSMT BATTERY VOLTAGE: 2.8 V
MDC IDC MSMT LEADCHNL RA IMPEDANCE VALUE: 475 Ohm
MDC IDC MSMT LEADCHNL RA PACING THRESHOLD PULSEWIDTH: 0.4 ms
MDC IDC MSMT LEADCHNL RV IMPEDANCE VALUE: 1019 Ohm
MDC IDC MSMT LEADCHNL RV PACING THRESHOLD PULSEWIDTH: 0.4 ms
MDC IDC SET LEADCHNL RV PACING AMPLITUDE: 2.5 V
MDC IDC SET LEADCHNL RV PACING PULSEWIDTH: 0.4 ms
MDC IDC SET LEADCHNL RV SENSING SENSITIVITY: 2.8 mV
MDC IDC STAT BRADY AP VS PERCENT: 99 %

## 2018-06-20 DIAGNOSIS — I482 Chronic atrial fibrillation: Secondary | ICD-10-CM | POA: Diagnosis not present

## 2018-06-26 DIAGNOSIS — R9431 Abnormal electrocardiogram [ECG] [EKG]: Secondary | ICD-10-CM | POA: Diagnosis not present

## 2018-06-26 DIAGNOSIS — S299XXA Unspecified injury of thorax, initial encounter: Secondary | ICD-10-CM | POA: Diagnosis not present

## 2018-06-26 DIAGNOSIS — D68318 Other hemorrhagic disorder due to intrinsic circulating anticoagulants, antibodies, or inhibitors: Secondary | ICD-10-CM | POA: Diagnosis not present

## 2018-06-26 DIAGNOSIS — S43121A Dislocation of right acromioclavicular joint, 100%-200% displacement, initial encounter: Secondary | ICD-10-CM | POA: Diagnosis not present

## 2018-06-26 DIAGNOSIS — R51 Headache: Secondary | ICD-10-CM | POA: Diagnosis not present

## 2018-06-26 DIAGNOSIS — S4991XA Unspecified injury of right shoulder and upper arm, initial encounter: Secondary | ICD-10-CM | POA: Diagnosis not present

## 2018-06-26 DIAGNOSIS — M25511 Pain in right shoulder: Secondary | ICD-10-CM | POA: Diagnosis not present

## 2018-06-26 DIAGNOSIS — R0781 Pleurodynia: Secondary | ICD-10-CM | POA: Diagnosis not present

## 2018-06-26 DIAGNOSIS — W19XXXA Unspecified fall, initial encounter: Secondary | ICD-10-CM | POA: Diagnosis not present

## 2018-06-26 DIAGNOSIS — S0990XA Unspecified injury of head, initial encounter: Secondary | ICD-10-CM | POA: Diagnosis not present

## 2018-06-26 DIAGNOSIS — W06XXXA Fall from bed, initial encounter: Secondary | ICD-10-CM | POA: Diagnosis not present

## 2018-06-26 DIAGNOSIS — S0181XA Laceration without foreign body of other part of head, initial encounter: Secondary | ICD-10-CM | POA: Diagnosis not present

## 2018-06-26 DIAGNOSIS — S43101A Unspecified dislocation of right acromioclavicular joint, initial encounter: Secondary | ICD-10-CM | POA: Diagnosis not present

## 2018-06-27 DIAGNOSIS — S0181XA Laceration without foreign body of other part of head, initial encounter: Secondary | ICD-10-CM | POA: Diagnosis not present

## 2018-06-27 DIAGNOSIS — R0689 Other abnormalities of breathing: Secondary | ICD-10-CM | POA: Diagnosis not present

## 2018-06-27 DIAGNOSIS — R51 Headache: Secondary | ICD-10-CM | POA: Diagnosis not present

## 2018-06-27 DIAGNOSIS — E039 Hypothyroidism, unspecified: Secondary | ICD-10-CM | POA: Diagnosis not present

## 2018-06-27 DIAGNOSIS — M940 Chondrocostal junction syndrome [Tietze]: Secondary | ICD-10-CM | POA: Diagnosis not present

## 2018-06-27 DIAGNOSIS — I4891 Unspecified atrial fibrillation: Secondary | ICD-10-CM | POA: Diagnosis not present

## 2018-06-27 DIAGNOSIS — R0789 Other chest pain: Secondary | ICD-10-CM | POA: Diagnosis not present

## 2018-06-27 DIAGNOSIS — S43101A Unspecified dislocation of right acromioclavicular joint, initial encounter: Secondary | ICD-10-CM | POA: Diagnosis not present

## 2018-06-27 DIAGNOSIS — W06XXXA Fall from bed, initial encounter: Secondary | ICD-10-CM | POA: Diagnosis not present

## 2018-06-27 DIAGNOSIS — Z79899 Other long term (current) drug therapy: Secondary | ICD-10-CM | POA: Diagnosis not present

## 2018-06-27 DIAGNOSIS — M25511 Pain in right shoulder: Secondary | ICD-10-CM | POA: Diagnosis not present

## 2018-06-27 DIAGNOSIS — Z7901 Long term (current) use of anticoagulants: Secondary | ICD-10-CM | POA: Diagnosis not present

## 2018-06-27 DIAGNOSIS — S299XXA Unspecified injury of thorax, initial encounter: Secondary | ICD-10-CM | POA: Diagnosis not present

## 2018-06-27 DIAGNOSIS — Z23 Encounter for immunization: Secondary | ICD-10-CM | POA: Diagnosis not present

## 2018-06-27 DIAGNOSIS — I480 Paroxysmal atrial fibrillation: Secondary | ICD-10-CM | POA: Diagnosis not present

## 2018-06-27 DIAGNOSIS — I10 Essential (primary) hypertension: Secondary | ICD-10-CM | POA: Diagnosis not present

## 2018-06-27 DIAGNOSIS — R0781 Pleurodynia: Secondary | ICD-10-CM | POA: Diagnosis not present

## 2018-06-27 DIAGNOSIS — R9431 Abnormal electrocardiogram [ECG] [EKG]: Secondary | ICD-10-CM | POA: Diagnosis not present

## 2018-06-27 DIAGNOSIS — D68318 Other hemorrhagic disorder due to intrinsic circulating anticoagulants, antibodies, or inhibitors: Secondary | ICD-10-CM | POA: Diagnosis not present

## 2018-06-27 DIAGNOSIS — R296 Repeated falls: Secondary | ICD-10-CM | POA: Diagnosis not present

## 2018-06-27 DIAGNOSIS — S0990XA Unspecified injury of head, initial encounter: Secondary | ICD-10-CM | POA: Diagnosis not present

## 2018-06-27 DIAGNOSIS — Z950 Presence of cardiac pacemaker: Secondary | ICD-10-CM | POA: Diagnosis not present

## 2018-06-27 DIAGNOSIS — K219 Gastro-esophageal reflux disease without esophagitis: Secondary | ICD-10-CM | POA: Diagnosis not present

## 2018-06-27 DIAGNOSIS — S4991XA Unspecified injury of right shoulder and upper arm, initial encounter: Secondary | ICD-10-CM | POA: Diagnosis not present

## 2018-06-27 DIAGNOSIS — R0902 Hypoxemia: Secondary | ICD-10-CM | POA: Diagnosis not present

## 2018-06-28 ENCOUNTER — Other Ambulatory Visit: Payer: Self-pay | Admitting: Cardiovascular Disease

## 2018-06-28 DIAGNOSIS — I10 Essential (primary) hypertension: Secondary | ICD-10-CM | POA: Diagnosis not present

## 2018-06-28 DIAGNOSIS — I4891 Unspecified atrial fibrillation: Secondary | ICD-10-CM | POA: Diagnosis not present

## 2018-06-28 DIAGNOSIS — R0789 Other chest pain: Secondary | ICD-10-CM | POA: Diagnosis not present

## 2018-06-28 DIAGNOSIS — R0902 Hypoxemia: Secondary | ICD-10-CM | POA: Diagnosis not present

## 2018-06-28 DIAGNOSIS — S43101A Unspecified dislocation of right acromioclavicular joint, initial encounter: Secondary | ICD-10-CM | POA: Diagnosis not present

## 2018-06-29 DIAGNOSIS — R0902 Hypoxemia: Secondary | ICD-10-CM | POA: Diagnosis not present

## 2018-06-29 DIAGNOSIS — W19XXXA Unspecified fall, initial encounter: Secondary | ICD-10-CM | POA: Diagnosis not present

## 2018-06-29 DIAGNOSIS — I10 Essential (primary) hypertension: Secondary | ICD-10-CM | POA: Diagnosis not present

## 2018-06-29 DIAGNOSIS — R4020 Unspecified coma: Secondary | ICD-10-CM | POA: Diagnosis not present

## 2018-06-29 DIAGNOSIS — R918 Other nonspecific abnormal finding of lung field: Secondary | ICD-10-CM | POA: Diagnosis not present

## 2018-06-30 DIAGNOSIS — S4991XA Unspecified injury of right shoulder and upper arm, initial encounter: Secondary | ICD-10-CM | POA: Diagnosis not present

## 2018-06-30 DIAGNOSIS — I10 Essential (primary) hypertension: Secondary | ICD-10-CM | POA: Diagnosis not present

## 2018-06-30 DIAGNOSIS — S0181XD Laceration without foreign body of other part of head, subsequent encounter: Secondary | ICD-10-CM | POA: Diagnosis not present

## 2018-06-30 DIAGNOSIS — R509 Fever, unspecified: Secondary | ICD-10-CM | POA: Diagnosis not present

## 2018-06-30 DIAGNOSIS — G894 Chronic pain syndrome: Secondary | ICD-10-CM | POA: Diagnosis not present

## 2018-06-30 DIAGNOSIS — Z79899 Other long term (current) drug therapy: Secondary | ICD-10-CM | POA: Diagnosis not present

## 2018-06-30 DIAGNOSIS — M79621 Pain in right upper arm: Secondary | ICD-10-CM | POA: Diagnosis not present

## 2018-06-30 DIAGNOSIS — R262 Difficulty in walking, not elsewhere classified: Secondary | ICD-10-CM | POA: Diagnosis not present

## 2018-06-30 DIAGNOSIS — R1311 Dysphagia, oral phase: Secondary | ICD-10-CM | POA: Diagnosis not present

## 2018-06-30 DIAGNOSIS — M6281 Muscle weakness (generalized): Secondary | ICD-10-CM | POA: Diagnosis not present

## 2018-06-30 DIAGNOSIS — S20211A Contusion of right front wall of thorax, initial encounter: Secondary | ICD-10-CM | POA: Diagnosis not present

## 2018-06-30 DIAGNOSIS — S43121D Dislocation of right acromioclavicular joint, 100%-200% displacement, subsequent encounter: Secondary | ICD-10-CM | POA: Diagnosis not present

## 2018-06-30 DIAGNOSIS — S20211D Contusion of right front wall of thorax, subsequent encounter: Secondary | ICD-10-CM | POA: Diagnosis not present

## 2018-06-30 DIAGNOSIS — R918 Other nonspecific abnormal finding of lung field: Secondary | ICD-10-CM | POA: Diagnosis not present

## 2018-06-30 DIAGNOSIS — I4891 Unspecified atrial fibrillation: Secondary | ICD-10-CM | POA: Diagnosis not present

## 2018-06-30 DIAGNOSIS — S43101A Unspecified dislocation of right acromioclavicular joint, initial encounter: Secondary | ICD-10-CM | POA: Diagnosis not present

## 2018-06-30 DIAGNOSIS — Z950 Presence of cardiac pacemaker: Secondary | ICD-10-CM | POA: Diagnosis not present

## 2018-06-30 DIAGNOSIS — K219 Gastro-esophageal reflux disease without esophagitis: Secondary | ICD-10-CM | POA: Diagnosis not present

## 2018-06-30 DIAGNOSIS — S2231XA Fracture of one rib, right side, initial encounter for closed fracture: Secondary | ICD-10-CM | POA: Diagnosis not present

## 2018-06-30 DIAGNOSIS — W19XXXA Unspecified fall, initial encounter: Secondary | ICD-10-CM | POA: Diagnosis not present

## 2018-06-30 DIAGNOSIS — I480 Paroxysmal atrial fibrillation: Secondary | ICD-10-CM | POA: Diagnosis not present

## 2018-06-30 DIAGNOSIS — E039 Hypothyroidism, unspecified: Secondary | ICD-10-CM | POA: Diagnosis not present

## 2018-06-30 DIAGNOSIS — R0902 Hypoxemia: Secondary | ICD-10-CM | POA: Diagnosis not present

## 2018-07-05 DIAGNOSIS — S0990XA Unspecified injury of head, initial encounter: Secondary | ICD-10-CM | POA: Diagnosis not present

## 2018-07-05 DIAGNOSIS — K649 Unspecified hemorrhoids: Secondary | ICD-10-CM | POA: Diagnosis not present

## 2018-07-05 DIAGNOSIS — S20211D Contusion of right front wall of thorax, subsequent encounter: Secondary | ICD-10-CM | POA: Diagnosis not present

## 2018-07-05 DIAGNOSIS — R410 Disorientation, unspecified: Secondary | ICD-10-CM | POA: Diagnosis not present

## 2018-07-05 DIAGNOSIS — F5102 Adjustment insomnia: Secondary | ICD-10-CM | POA: Diagnosis not present

## 2018-07-05 DIAGNOSIS — S43121D Dislocation of right acromioclavicular joint, 100%-200% displacement, subsequent encounter: Secondary | ICD-10-CM | POA: Diagnosis not present

## 2018-07-05 DIAGNOSIS — S43101D Unspecified dislocation of right acromioclavicular joint, subsequent encounter: Secondary | ICD-10-CM | POA: Diagnosis not present

## 2018-07-05 DIAGNOSIS — R269 Unspecified abnormalities of gait and mobility: Secondary | ICD-10-CM | POA: Diagnosis not present

## 2018-07-05 DIAGNOSIS — E039 Hypothyroidism, unspecified: Secondary | ICD-10-CM | POA: Diagnosis not present

## 2018-07-05 DIAGNOSIS — R531 Weakness: Secondary | ICD-10-CM | POA: Diagnosis not present

## 2018-07-05 DIAGNOSIS — S43101A Unspecified dislocation of right acromioclavicular joint, initial encounter: Secondary | ICD-10-CM | POA: Diagnosis not present

## 2018-07-05 DIAGNOSIS — Z950 Presence of cardiac pacemaker: Secondary | ICD-10-CM | POA: Diagnosis not present

## 2018-07-05 DIAGNOSIS — G894 Chronic pain syndrome: Secondary | ICD-10-CM | POA: Diagnosis not present

## 2018-07-05 DIAGNOSIS — R0902 Hypoxemia: Secondary | ICD-10-CM | POA: Diagnosis not present

## 2018-07-05 DIAGNOSIS — R1311 Dysphagia, oral phase: Secondary | ICD-10-CM | POA: Diagnosis not present

## 2018-07-05 DIAGNOSIS — K219 Gastro-esophageal reflux disease without esophagitis: Secondary | ICD-10-CM | POA: Diagnosis not present

## 2018-07-05 DIAGNOSIS — S20211A Contusion of right front wall of thorax, initial encounter: Secondary | ICD-10-CM | POA: Diagnosis not present

## 2018-07-05 DIAGNOSIS — R51 Headache: Secondary | ICD-10-CM | POA: Diagnosis not present

## 2018-07-05 DIAGNOSIS — M1610 Unilateral primary osteoarthritis, unspecified hip: Secondary | ICD-10-CM | POA: Diagnosis not present

## 2018-07-05 DIAGNOSIS — S0181XD Laceration without foreign body of other part of head, subsequent encounter: Secondary | ICD-10-CM | POA: Diagnosis not present

## 2018-07-05 DIAGNOSIS — I4811 Longstanding persistent atrial fibrillation: Secondary | ICD-10-CM | POA: Diagnosis not present

## 2018-07-05 DIAGNOSIS — I10 Essential (primary) hypertension: Secondary | ICD-10-CM | POA: Diagnosis not present

## 2018-07-05 DIAGNOSIS — K5900 Constipation, unspecified: Secondary | ICD-10-CM | POA: Diagnosis not present

## 2018-07-05 DIAGNOSIS — I4891 Unspecified atrial fibrillation: Secondary | ICD-10-CM | POA: Diagnosis not present

## 2018-07-05 DIAGNOSIS — M6281 Muscle weakness (generalized): Secondary | ICD-10-CM | POA: Diagnosis not present

## 2018-07-05 DIAGNOSIS — R42 Dizziness and giddiness: Secondary | ICD-10-CM | POA: Diagnosis not present

## 2018-07-05 DIAGNOSIS — R262 Difficulty in walking, not elsewhere classified: Secondary | ICD-10-CM | POA: Diagnosis not present

## 2018-07-08 DIAGNOSIS — I4811 Longstanding persistent atrial fibrillation: Secondary | ICD-10-CM | POA: Diagnosis not present

## 2018-07-08 DIAGNOSIS — I10 Essential (primary) hypertension: Secondary | ICD-10-CM | POA: Diagnosis not present

## 2018-07-08 DIAGNOSIS — S43101D Unspecified dislocation of right acromioclavicular joint, subsequent encounter: Secondary | ICD-10-CM | POA: Diagnosis not present

## 2018-07-08 DIAGNOSIS — S20211D Contusion of right front wall of thorax, subsequent encounter: Secondary | ICD-10-CM | POA: Diagnosis not present

## 2018-07-12 DIAGNOSIS — S0181XD Laceration without foreign body of other part of head, subsequent encounter: Secondary | ICD-10-CM | POA: Diagnosis not present

## 2018-07-12 DIAGNOSIS — S20211D Contusion of right front wall of thorax, subsequent encounter: Secondary | ICD-10-CM | POA: Diagnosis not present

## 2018-07-12 DIAGNOSIS — S43101D Unspecified dislocation of right acromioclavicular joint, subsequent encounter: Secondary | ICD-10-CM | POA: Diagnosis not present

## 2018-07-17 DIAGNOSIS — S43101D Unspecified dislocation of right acromioclavicular joint, subsequent encounter: Secondary | ICD-10-CM | POA: Diagnosis not present

## 2018-07-20 DIAGNOSIS — S0990XA Unspecified injury of head, initial encounter: Secondary | ICD-10-CM | POA: Diagnosis not present

## 2018-07-20 DIAGNOSIS — R410 Disorientation, unspecified: Secondary | ICD-10-CM | POA: Diagnosis not present

## 2018-07-20 DIAGNOSIS — R51 Headache: Secondary | ICD-10-CM | POA: Diagnosis not present

## 2018-07-20 DIAGNOSIS — R42 Dizziness and giddiness: Secondary | ICD-10-CM | POA: Diagnosis not present

## 2018-07-24 DIAGNOSIS — R42 Dizziness and giddiness: Secondary | ICD-10-CM | POA: Diagnosis not present

## 2018-07-24 DIAGNOSIS — S43101D Unspecified dislocation of right acromioclavicular joint, subsequent encounter: Secondary | ICD-10-CM | POA: Diagnosis not present

## 2018-07-24 DIAGNOSIS — K5900 Constipation, unspecified: Secondary | ICD-10-CM | POA: Diagnosis not present

## 2018-07-24 DIAGNOSIS — K649 Unspecified hemorrhoids: Secondary | ICD-10-CM | POA: Diagnosis not present

## 2018-07-26 DIAGNOSIS — S43101D Unspecified dislocation of right acromioclavicular joint, subsequent encounter: Secondary | ICD-10-CM | POA: Diagnosis not present

## 2018-08-02 DIAGNOSIS — I4811 Longstanding persistent atrial fibrillation: Secondary | ICD-10-CM | POA: Diagnosis not present

## 2018-08-02 DIAGNOSIS — I10 Essential (primary) hypertension: Secondary | ICD-10-CM | POA: Diagnosis not present

## 2018-08-02 DIAGNOSIS — S43101D Unspecified dislocation of right acromioclavicular joint, subsequent encounter: Secondary | ICD-10-CM | POA: Diagnosis not present

## 2018-08-02 DIAGNOSIS — S20211D Contusion of right front wall of thorax, subsequent encounter: Secondary | ICD-10-CM | POA: Diagnosis not present

## 2018-08-05 DIAGNOSIS — I10 Essential (primary) hypertension: Secondary | ICD-10-CM | POA: Diagnosis not present

## 2018-08-05 DIAGNOSIS — M1990 Unspecified osteoarthritis, unspecified site: Secondary | ICD-10-CM | POA: Diagnosis not present

## 2018-08-05 DIAGNOSIS — W19XXXD Unspecified fall, subsequent encounter: Secondary | ICD-10-CM | POA: Diagnosis not present

## 2018-08-05 DIAGNOSIS — Z950 Presence of cardiac pacemaker: Secondary | ICD-10-CM | POA: Diagnosis not present

## 2018-08-05 DIAGNOSIS — S20211D Contusion of right front wall of thorax, subsequent encounter: Secondary | ICD-10-CM | POA: Diagnosis not present

## 2018-08-05 DIAGNOSIS — S43121D Dislocation of right acromioclavicular joint, 100%-200% displacement, subsequent encounter: Secondary | ICD-10-CM | POA: Diagnosis not present

## 2018-08-05 DIAGNOSIS — I4811 Longstanding persistent atrial fibrillation: Secondary | ICD-10-CM | POA: Diagnosis not present

## 2018-08-05 DIAGNOSIS — Z7901 Long term (current) use of anticoagulants: Secondary | ICD-10-CM | POA: Diagnosis not present

## 2018-08-05 DIAGNOSIS — K219 Gastro-esophageal reflux disease without esophagitis: Secondary | ICD-10-CM | POA: Diagnosis not present

## 2018-08-07 DIAGNOSIS — Z950 Presence of cardiac pacemaker: Secondary | ICD-10-CM | POA: Diagnosis not present

## 2018-08-07 DIAGNOSIS — M1990 Unspecified osteoarthritis, unspecified site: Secondary | ICD-10-CM | POA: Diagnosis not present

## 2018-08-07 DIAGNOSIS — I4811 Longstanding persistent atrial fibrillation: Secondary | ICD-10-CM | POA: Diagnosis not present

## 2018-08-07 DIAGNOSIS — Z7901 Long term (current) use of anticoagulants: Secondary | ICD-10-CM | POA: Diagnosis not present

## 2018-08-07 DIAGNOSIS — I10 Essential (primary) hypertension: Secondary | ICD-10-CM | POA: Diagnosis not present

## 2018-08-07 DIAGNOSIS — S20211D Contusion of right front wall of thorax, subsequent encounter: Secondary | ICD-10-CM | POA: Diagnosis not present

## 2018-08-07 DIAGNOSIS — K219 Gastro-esophageal reflux disease without esophagitis: Secondary | ICD-10-CM | POA: Diagnosis not present

## 2018-08-07 DIAGNOSIS — I480 Paroxysmal atrial fibrillation: Secondary | ICD-10-CM | POA: Diagnosis not present

## 2018-08-07 DIAGNOSIS — S43121D Dislocation of right acromioclavicular joint, 100%-200% displacement, subsequent encounter: Secondary | ICD-10-CM | POA: Diagnosis not present

## 2018-08-07 DIAGNOSIS — W19XXXD Unspecified fall, subsequent encounter: Secondary | ICD-10-CM | POA: Diagnosis not present

## 2018-08-09 DIAGNOSIS — M1990 Unspecified osteoarthritis, unspecified site: Secondary | ICD-10-CM | POA: Diagnosis not present

## 2018-08-09 DIAGNOSIS — S20211D Contusion of right front wall of thorax, subsequent encounter: Secondary | ICD-10-CM | POA: Diagnosis not present

## 2018-08-09 DIAGNOSIS — S43121D Dislocation of right acromioclavicular joint, 100%-200% displacement, subsequent encounter: Secondary | ICD-10-CM | POA: Diagnosis not present

## 2018-08-09 DIAGNOSIS — W19XXXD Unspecified fall, subsequent encounter: Secondary | ICD-10-CM | POA: Diagnosis not present

## 2018-08-09 DIAGNOSIS — Z950 Presence of cardiac pacemaker: Secondary | ICD-10-CM | POA: Diagnosis not present

## 2018-08-09 DIAGNOSIS — K219 Gastro-esophageal reflux disease without esophagitis: Secondary | ICD-10-CM | POA: Diagnosis not present

## 2018-08-09 DIAGNOSIS — I4811 Longstanding persistent atrial fibrillation: Secondary | ICD-10-CM | POA: Diagnosis not present

## 2018-08-09 DIAGNOSIS — I10 Essential (primary) hypertension: Secondary | ICD-10-CM | POA: Diagnosis not present

## 2018-08-09 DIAGNOSIS — Z7901 Long term (current) use of anticoagulants: Secondary | ICD-10-CM | POA: Diagnosis not present

## 2018-08-10 DIAGNOSIS — M1990 Unspecified osteoarthritis, unspecified site: Secondary | ICD-10-CM | POA: Diagnosis not present

## 2018-08-10 DIAGNOSIS — S43121D Dislocation of right acromioclavicular joint, 100%-200% displacement, subsequent encounter: Secondary | ICD-10-CM | POA: Diagnosis not present

## 2018-08-10 DIAGNOSIS — Z950 Presence of cardiac pacemaker: Secondary | ICD-10-CM | POA: Diagnosis not present

## 2018-08-10 DIAGNOSIS — K219 Gastro-esophageal reflux disease without esophagitis: Secondary | ICD-10-CM | POA: Diagnosis not present

## 2018-08-10 DIAGNOSIS — W19XXXD Unspecified fall, subsequent encounter: Secondary | ICD-10-CM | POA: Diagnosis not present

## 2018-08-10 DIAGNOSIS — I10 Essential (primary) hypertension: Secondary | ICD-10-CM | POA: Diagnosis not present

## 2018-08-10 DIAGNOSIS — Z7901 Long term (current) use of anticoagulants: Secondary | ICD-10-CM | POA: Diagnosis not present

## 2018-08-10 DIAGNOSIS — I4811 Longstanding persistent atrial fibrillation: Secondary | ICD-10-CM | POA: Diagnosis not present

## 2018-08-10 DIAGNOSIS — S20211D Contusion of right front wall of thorax, subsequent encounter: Secondary | ICD-10-CM | POA: Diagnosis not present

## 2018-08-14 ENCOUNTER — Ambulatory Visit (INDEPENDENT_AMBULATORY_CARE_PROVIDER_SITE_OTHER): Payer: Medicare Other

## 2018-08-14 ENCOUNTER — Telehealth: Payer: Self-pay

## 2018-08-14 DIAGNOSIS — S20211D Contusion of right front wall of thorax, subsequent encounter: Secondary | ICD-10-CM | POA: Diagnosis not present

## 2018-08-14 DIAGNOSIS — I495 Sick sinus syndrome: Secondary | ICD-10-CM | POA: Diagnosis not present

## 2018-08-14 DIAGNOSIS — Z950 Presence of cardiac pacemaker: Secondary | ICD-10-CM | POA: Diagnosis not present

## 2018-08-14 DIAGNOSIS — I10 Essential (primary) hypertension: Secondary | ICD-10-CM | POA: Diagnosis not present

## 2018-08-14 DIAGNOSIS — K219 Gastro-esophageal reflux disease without esophagitis: Secondary | ICD-10-CM | POA: Diagnosis not present

## 2018-08-14 DIAGNOSIS — W19XXXD Unspecified fall, subsequent encounter: Secondary | ICD-10-CM | POA: Diagnosis not present

## 2018-08-14 DIAGNOSIS — Z7901 Long term (current) use of anticoagulants: Secondary | ICD-10-CM | POA: Diagnosis not present

## 2018-08-14 DIAGNOSIS — I4811 Longstanding persistent atrial fibrillation: Secondary | ICD-10-CM | POA: Diagnosis not present

## 2018-08-14 DIAGNOSIS — M1990 Unspecified osteoarthritis, unspecified site: Secondary | ICD-10-CM | POA: Diagnosis not present

## 2018-08-14 DIAGNOSIS — S43121D Dislocation of right acromioclavicular joint, 100%-200% displacement, subsequent encounter: Secondary | ICD-10-CM | POA: Diagnosis not present

## 2018-08-14 NOTE — Telephone Encounter (Signed)
Spoke with pt and reminded pt of remote transmission that is due today. Pt verbalized understanding.   

## 2018-08-14 NOTE — Progress Notes (Signed)
Remote pacemaker transmission.   

## 2018-08-15 DIAGNOSIS — Z950 Presence of cardiac pacemaker: Secondary | ICD-10-CM | POA: Diagnosis not present

## 2018-08-15 DIAGNOSIS — Z7901 Long term (current) use of anticoagulants: Secondary | ICD-10-CM | POA: Diagnosis not present

## 2018-08-15 DIAGNOSIS — S43121D Dislocation of right acromioclavicular joint, 100%-200% displacement, subsequent encounter: Secondary | ICD-10-CM | POA: Diagnosis not present

## 2018-08-15 DIAGNOSIS — M1990 Unspecified osteoarthritis, unspecified site: Secondary | ICD-10-CM | POA: Diagnosis not present

## 2018-08-15 DIAGNOSIS — I10 Essential (primary) hypertension: Secondary | ICD-10-CM | POA: Diagnosis not present

## 2018-08-15 DIAGNOSIS — K219 Gastro-esophageal reflux disease without esophagitis: Secondary | ICD-10-CM | POA: Diagnosis not present

## 2018-08-15 DIAGNOSIS — I4811 Longstanding persistent atrial fibrillation: Secondary | ICD-10-CM | POA: Diagnosis not present

## 2018-08-15 DIAGNOSIS — S20211D Contusion of right front wall of thorax, subsequent encounter: Secondary | ICD-10-CM | POA: Diagnosis not present

## 2018-08-15 DIAGNOSIS — W19XXXD Unspecified fall, subsequent encounter: Secondary | ICD-10-CM | POA: Diagnosis not present

## 2018-08-17 ENCOUNTER — Encounter: Payer: Self-pay | Admitting: Cardiology

## 2018-08-17 DIAGNOSIS — M1990 Unspecified osteoarthritis, unspecified site: Secondary | ICD-10-CM | POA: Diagnosis not present

## 2018-08-17 DIAGNOSIS — Z950 Presence of cardiac pacemaker: Secondary | ICD-10-CM | POA: Diagnosis not present

## 2018-08-17 DIAGNOSIS — I10 Essential (primary) hypertension: Secondary | ICD-10-CM | POA: Diagnosis not present

## 2018-08-17 DIAGNOSIS — Z7901 Long term (current) use of anticoagulants: Secondary | ICD-10-CM | POA: Diagnosis not present

## 2018-08-17 DIAGNOSIS — S43121D Dislocation of right acromioclavicular joint, 100%-200% displacement, subsequent encounter: Secondary | ICD-10-CM | POA: Diagnosis not present

## 2018-08-17 DIAGNOSIS — I4811 Longstanding persistent atrial fibrillation: Secondary | ICD-10-CM | POA: Diagnosis not present

## 2018-08-17 DIAGNOSIS — K219 Gastro-esophageal reflux disease without esophagitis: Secondary | ICD-10-CM | POA: Diagnosis not present

## 2018-08-17 DIAGNOSIS — W19XXXD Unspecified fall, subsequent encounter: Secondary | ICD-10-CM | POA: Diagnosis not present

## 2018-08-17 DIAGNOSIS — S20211D Contusion of right front wall of thorax, subsequent encounter: Secondary | ICD-10-CM | POA: Diagnosis not present

## 2018-08-21 DIAGNOSIS — I10 Essential (primary) hypertension: Secondary | ICD-10-CM | POA: Diagnosis not present

## 2018-08-21 DIAGNOSIS — S43121D Dislocation of right acromioclavicular joint, 100%-200% displacement, subsequent encounter: Secondary | ICD-10-CM | POA: Diagnosis not present

## 2018-08-21 DIAGNOSIS — W19XXXD Unspecified fall, subsequent encounter: Secondary | ICD-10-CM | POA: Diagnosis not present

## 2018-08-21 DIAGNOSIS — M1990 Unspecified osteoarthritis, unspecified site: Secondary | ICD-10-CM | POA: Diagnosis not present

## 2018-08-21 DIAGNOSIS — Z950 Presence of cardiac pacemaker: Secondary | ICD-10-CM | POA: Diagnosis not present

## 2018-08-21 DIAGNOSIS — Z7901 Long term (current) use of anticoagulants: Secondary | ICD-10-CM | POA: Diagnosis not present

## 2018-08-21 DIAGNOSIS — K219 Gastro-esophageal reflux disease without esophagitis: Secondary | ICD-10-CM | POA: Diagnosis not present

## 2018-08-21 DIAGNOSIS — I4811 Longstanding persistent atrial fibrillation: Secondary | ICD-10-CM | POA: Diagnosis not present

## 2018-08-21 DIAGNOSIS — S20211D Contusion of right front wall of thorax, subsequent encounter: Secondary | ICD-10-CM | POA: Diagnosis not present

## 2018-08-22 DIAGNOSIS — K219 Gastro-esophageal reflux disease without esophagitis: Secondary | ICD-10-CM | POA: Diagnosis not present

## 2018-08-22 DIAGNOSIS — Z950 Presence of cardiac pacemaker: Secondary | ICD-10-CM | POA: Diagnosis not present

## 2018-08-22 DIAGNOSIS — S43121D Dislocation of right acromioclavicular joint, 100%-200% displacement, subsequent encounter: Secondary | ICD-10-CM | POA: Diagnosis not present

## 2018-08-22 DIAGNOSIS — I4811 Longstanding persistent atrial fibrillation: Secondary | ICD-10-CM | POA: Diagnosis not present

## 2018-08-22 DIAGNOSIS — W19XXXD Unspecified fall, subsequent encounter: Secondary | ICD-10-CM | POA: Diagnosis not present

## 2018-08-22 DIAGNOSIS — I10 Essential (primary) hypertension: Secondary | ICD-10-CM | POA: Diagnosis not present

## 2018-08-22 DIAGNOSIS — Z7901 Long term (current) use of anticoagulants: Secondary | ICD-10-CM | POA: Diagnosis not present

## 2018-08-22 DIAGNOSIS — S20211D Contusion of right front wall of thorax, subsequent encounter: Secondary | ICD-10-CM | POA: Diagnosis not present

## 2018-08-22 DIAGNOSIS — M1990 Unspecified osteoarthritis, unspecified site: Secondary | ICD-10-CM | POA: Diagnosis not present

## 2018-08-23 DIAGNOSIS — B351 Tinea unguium: Secondary | ICD-10-CM | POA: Diagnosis not present

## 2018-08-23 DIAGNOSIS — M79675 Pain in left toe(s): Secondary | ICD-10-CM | POA: Diagnosis not present

## 2018-08-25 DIAGNOSIS — K219 Gastro-esophageal reflux disease without esophagitis: Secondary | ICD-10-CM | POA: Diagnosis not present

## 2018-08-25 DIAGNOSIS — I4811 Longstanding persistent atrial fibrillation: Secondary | ICD-10-CM | POA: Diagnosis not present

## 2018-08-25 DIAGNOSIS — S43121D Dislocation of right acromioclavicular joint, 100%-200% displacement, subsequent encounter: Secondary | ICD-10-CM | POA: Diagnosis not present

## 2018-08-25 DIAGNOSIS — M1990 Unspecified osteoarthritis, unspecified site: Secondary | ICD-10-CM | POA: Diagnosis not present

## 2018-08-25 DIAGNOSIS — S20211D Contusion of right front wall of thorax, subsequent encounter: Secondary | ICD-10-CM | POA: Diagnosis not present

## 2018-08-25 DIAGNOSIS — Z7901 Long term (current) use of anticoagulants: Secondary | ICD-10-CM | POA: Diagnosis not present

## 2018-08-25 DIAGNOSIS — I10 Essential (primary) hypertension: Secondary | ICD-10-CM | POA: Diagnosis not present

## 2018-08-25 DIAGNOSIS — W19XXXD Unspecified fall, subsequent encounter: Secondary | ICD-10-CM | POA: Diagnosis not present

## 2018-08-25 DIAGNOSIS — Z950 Presence of cardiac pacemaker: Secondary | ICD-10-CM | POA: Diagnosis not present

## 2018-08-26 DIAGNOSIS — F5102 Adjustment insomnia: Secondary | ICD-10-CM | POA: Diagnosis not present

## 2018-08-28 DIAGNOSIS — I4811 Longstanding persistent atrial fibrillation: Secondary | ICD-10-CM | POA: Diagnosis not present

## 2018-08-28 DIAGNOSIS — Z7901 Long term (current) use of anticoagulants: Secondary | ICD-10-CM | POA: Diagnosis not present

## 2018-08-28 DIAGNOSIS — S20211D Contusion of right front wall of thorax, subsequent encounter: Secondary | ICD-10-CM | POA: Diagnosis not present

## 2018-08-28 DIAGNOSIS — W19XXXD Unspecified fall, subsequent encounter: Secondary | ICD-10-CM | POA: Diagnosis not present

## 2018-08-28 DIAGNOSIS — K219 Gastro-esophageal reflux disease without esophagitis: Secondary | ICD-10-CM | POA: Diagnosis not present

## 2018-08-28 DIAGNOSIS — I10 Essential (primary) hypertension: Secondary | ICD-10-CM | POA: Diagnosis not present

## 2018-08-28 DIAGNOSIS — Z950 Presence of cardiac pacemaker: Secondary | ICD-10-CM | POA: Diagnosis not present

## 2018-08-28 DIAGNOSIS — S43121D Dislocation of right acromioclavicular joint, 100%-200% displacement, subsequent encounter: Secondary | ICD-10-CM | POA: Diagnosis not present

## 2018-08-28 DIAGNOSIS — M1990 Unspecified osteoarthritis, unspecified site: Secondary | ICD-10-CM | POA: Diagnosis not present

## 2018-08-29 DIAGNOSIS — S20211D Contusion of right front wall of thorax, subsequent encounter: Secondary | ICD-10-CM | POA: Diagnosis not present

## 2018-08-29 DIAGNOSIS — K219 Gastro-esophageal reflux disease without esophagitis: Secondary | ICD-10-CM | POA: Diagnosis not present

## 2018-08-29 DIAGNOSIS — S43121D Dislocation of right acromioclavicular joint, 100%-200% displacement, subsequent encounter: Secondary | ICD-10-CM | POA: Diagnosis not present

## 2018-08-29 DIAGNOSIS — W19XXXD Unspecified fall, subsequent encounter: Secondary | ICD-10-CM | POA: Diagnosis not present

## 2018-08-29 DIAGNOSIS — I4811 Longstanding persistent atrial fibrillation: Secondary | ICD-10-CM | POA: Diagnosis not present

## 2018-08-29 DIAGNOSIS — Z950 Presence of cardiac pacemaker: Secondary | ICD-10-CM | POA: Diagnosis not present

## 2018-08-29 DIAGNOSIS — Z7901 Long term (current) use of anticoagulants: Secondary | ICD-10-CM | POA: Diagnosis not present

## 2018-08-29 DIAGNOSIS — M1990 Unspecified osteoarthritis, unspecified site: Secondary | ICD-10-CM | POA: Diagnosis not present

## 2018-08-29 DIAGNOSIS — I10 Essential (primary) hypertension: Secondary | ICD-10-CM | POA: Diagnosis not present

## 2018-08-30 DIAGNOSIS — M1990 Unspecified osteoarthritis, unspecified site: Secondary | ICD-10-CM | POA: Diagnosis not present

## 2018-08-30 DIAGNOSIS — I4811 Longstanding persistent atrial fibrillation: Secondary | ICD-10-CM | POA: Diagnosis not present

## 2018-08-30 DIAGNOSIS — S43121D Dislocation of right acromioclavicular joint, 100%-200% displacement, subsequent encounter: Secondary | ICD-10-CM | POA: Diagnosis not present

## 2018-08-30 DIAGNOSIS — Z950 Presence of cardiac pacemaker: Secondary | ICD-10-CM | POA: Diagnosis not present

## 2018-08-30 DIAGNOSIS — Z7901 Long term (current) use of anticoagulants: Secondary | ICD-10-CM | POA: Diagnosis not present

## 2018-08-30 DIAGNOSIS — I10 Essential (primary) hypertension: Secondary | ICD-10-CM | POA: Diagnosis not present

## 2018-08-30 DIAGNOSIS — S20211D Contusion of right front wall of thorax, subsequent encounter: Secondary | ICD-10-CM | POA: Diagnosis not present

## 2018-08-30 DIAGNOSIS — K219 Gastro-esophageal reflux disease without esophagitis: Secondary | ICD-10-CM | POA: Diagnosis not present

## 2018-08-30 DIAGNOSIS — W19XXXD Unspecified fall, subsequent encounter: Secondary | ICD-10-CM | POA: Diagnosis not present

## 2018-09-03 DIAGNOSIS — R0902 Hypoxemia: Secondary | ICD-10-CM | POA: Diagnosis not present

## 2018-09-03 DIAGNOSIS — I480 Paroxysmal atrial fibrillation: Secondary | ICD-10-CM | POA: Diagnosis not present

## 2018-09-03 DIAGNOSIS — W19XXXA Unspecified fall, initial encounter: Secondary | ICD-10-CM | POA: Diagnosis not present

## 2018-09-03 DIAGNOSIS — S43006A Unspecified dislocation of unspecified shoulder joint, initial encounter: Secondary | ICD-10-CM | POA: Diagnosis not present

## 2018-09-03 DIAGNOSIS — S0181XA Laceration without foreign body of other part of head, initial encounter: Secondary | ICD-10-CM | POA: Diagnosis not present

## 2018-09-04 DIAGNOSIS — I482 Chronic atrial fibrillation, unspecified: Secondary | ICD-10-CM | POA: Diagnosis not present

## 2018-09-11 DIAGNOSIS — R600 Localized edema: Secondary | ICD-10-CM | POA: Diagnosis not present

## 2018-09-11 DIAGNOSIS — R21 Rash and other nonspecific skin eruption: Secondary | ICD-10-CM | POA: Diagnosis not present

## 2018-09-18 DIAGNOSIS — R109 Unspecified abdominal pain: Secondary | ICD-10-CM | POA: Diagnosis not present

## 2018-09-18 DIAGNOSIS — R300 Dysuria: Secondary | ICD-10-CM | POA: Diagnosis not present

## 2018-10-05 DIAGNOSIS — I480 Paroxysmal atrial fibrillation: Secondary | ICD-10-CM | POA: Diagnosis not present

## 2018-10-10 LAB — CUP PACEART REMOTE DEVICE CHECK
Battery Impedance: 280 Ohm
Brady Statistic AP VS Percent: 88 %
Brady Statistic AS VP Percent: 0 %
Date Time Interrogation Session: 20191118183122
Implantable Lead Implant Date: 20040827
Implantable Lead Location: 753859
Lead Channel Pacing Threshold Amplitude: 0.5 V
Lead Channel Pacing Threshold Amplitude: 1.125 V
Lead Channel Pacing Threshold Pulse Width: 0.4 ms
Lead Channel Pacing Threshold Pulse Width: 0.4 ms
Lead Channel Setting Pacing Amplitude: 2.5 V
MDC IDC LEAD IMPLANT DT: 20040827
MDC IDC LEAD LOCATION: 753860
MDC IDC MSMT BATTERY REMAINING LONGEVITY: 112 mo
MDC IDC MSMT BATTERY VOLTAGE: 2.79 V
MDC IDC MSMT LEADCHNL RA IMPEDANCE VALUE: 488 Ohm
MDC IDC MSMT LEADCHNL RV IMPEDANCE VALUE: 898 Ohm
MDC IDC PG IMPLANT DT: 20140519
MDC IDC SET LEADCHNL RA PACING AMPLITUDE: 2 V
MDC IDC SET LEADCHNL RV PACING PULSEWIDTH: 0.4 ms
MDC IDC SET LEADCHNL RV SENSING SENSITIVITY: 2.8 mV
MDC IDC STAT BRADY AP VP PERCENT: 0 %
MDC IDC STAT BRADY AS VS PERCENT: 11 %

## 2018-10-25 DIAGNOSIS — R21 Rash and other nonspecific skin eruption: Secondary | ICD-10-CM | POA: Diagnosis not present

## 2018-10-25 DIAGNOSIS — M25561 Pain in right knee: Secondary | ICD-10-CM | POA: Diagnosis not present

## 2018-10-25 DIAGNOSIS — R600 Localized edema: Secondary | ICD-10-CM | POA: Diagnosis not present

## 2018-11-09 DIAGNOSIS — I4891 Unspecified atrial fibrillation: Secondary | ICD-10-CM | POA: Diagnosis not present

## 2018-11-09 DIAGNOSIS — R300 Dysuria: Secondary | ICD-10-CM | POA: Diagnosis not present

## 2018-11-13 ENCOUNTER — Ambulatory Visit (INDEPENDENT_AMBULATORY_CARE_PROVIDER_SITE_OTHER): Payer: Medicare Other

## 2018-11-13 DIAGNOSIS — I495 Sick sinus syndrome: Secondary | ICD-10-CM

## 2018-11-15 ENCOUNTER — Telehealth: Payer: Self-pay

## 2018-11-15 LAB — CUP PACEART REMOTE DEVICE CHECK
Battery Impedance: 305 Ohm
Battery Remaining Longevity: 109 mo
Battery Voltage: 2.79 V
Brady Statistic AP VP Percent: 0 %
Brady Statistic AP VS Percent: 93 %
Brady Statistic AS VP Percent: 0 %
Date Time Interrogation Session: 20200218180118
Implantable Lead Implant Date: 20040827
Implantable Lead Implant Date: 20040827
Implantable Lead Location: 753859
Implantable Lead Model: 4092
Implantable Pulse Generator Implant Date: 20140519
Lead Channel Impedance Value: 1043 Ohm
Lead Channel Impedance Value: 519 Ohm
Lead Channel Pacing Threshold Amplitude: 0.5 V
Lead Channel Pacing Threshold Amplitude: 1.625 V
Lead Channel Pacing Threshold Pulse Width: 0.4 ms
Lead Channel Setting Pacing Amplitude: 2.5 V
Lead Channel Setting Pacing Pulse Width: 0.4 ms
Lead Channel Setting Sensing Sensitivity: 4 mV
MDC IDC LEAD LOCATION: 753860
MDC IDC MSMT LEADCHNL RV PACING THRESHOLD PULSEWIDTH: 0.4 ms
MDC IDC SET LEADCHNL RA PACING AMPLITUDE: 2 V
MDC IDC STAT BRADY AS VS PERCENT: 7 %

## 2018-11-15 NOTE — Telephone Encounter (Signed)
Spoke with patient to remind of missed remote transmission 

## 2018-11-22 NOTE — Progress Notes (Signed)
Remote pacemaker transmission.   

## 2018-12-08 DIAGNOSIS — I482 Chronic atrial fibrillation, unspecified: Secondary | ICD-10-CM | POA: Diagnosis not present

## 2018-12-18 ENCOUNTER — Telehealth: Payer: Self-pay | Admitting: Cardiovascular Disease

## 2018-12-18 NOTE — Telephone Encounter (Signed)
I called and spoke with the patient about rescheduling her appointment given the COVID19 pandemic, in order to avoid unnecessary exposure.  She is stable from a cardiac standpoint with respect to symptoms and is very agreeable to having her appointment rescheduled to a later date and was thankful for the call. 

## 2018-12-18 NOTE — Telephone Encounter (Signed)
Appointment has been rescheduled to 01/08/2019.  

## 2018-12-19 ENCOUNTER — Ambulatory Visit: Payer: Medicare Other | Admitting: Cardiovascular Disease

## 2018-12-20 DIAGNOSIS — R0902 Hypoxemia: Secondary | ICD-10-CM | POA: Diagnosis not present

## 2019-01-04 ENCOUNTER — Encounter: Payer: Self-pay | Admitting: *Deleted

## 2019-01-04 ENCOUNTER — Telehealth: Payer: Self-pay | Admitting: *Deleted

## 2019-01-04 NOTE — Telephone Encounter (Signed)
Medications and allergies reviewed with patient and awaiting call back from [facility] staff to review meds again since patient was unable to give doses and names of most medications.  Aware to check BP, HR and weight prior to visit on that day [facility] admission end of September-requested  The patient verbally consented for a telehealth visit for 01/08/2019 with [facility] and understands that her insurance company will be billed for the encounter.

## 2019-01-04 NOTE — Telephone Encounter (Signed)
Medications reviewed with Shelton Silvas at [facility].

## 2019-01-08 ENCOUNTER — Encounter: Payer: Self-pay | Admitting: Cardiology

## 2019-01-08 ENCOUNTER — Telehealth (INDEPENDENT_AMBULATORY_CARE_PROVIDER_SITE_OTHER): Payer: Medicare Other | Admitting: Cardiology

## 2019-01-08 VITALS — BP 126/68 | HR 74 | Ht 62.0 in | Wt 156.0 lb

## 2019-01-08 DIAGNOSIS — I480 Paroxysmal atrial fibrillation: Secondary | ICD-10-CM

## 2019-01-08 DIAGNOSIS — Z7189 Other specified counseling: Secondary | ICD-10-CM

## 2019-01-08 DIAGNOSIS — R600 Localized edema: Secondary | ICD-10-CM | POA: Insufficient documentation

## 2019-01-08 DIAGNOSIS — I2510 Atherosclerotic heart disease of native coronary artery without angina pectoris: Secondary | ICD-10-CM

## 2019-01-08 DIAGNOSIS — I10 Essential (primary) hypertension: Secondary | ICD-10-CM

## 2019-01-08 DIAGNOSIS — Z950 Presence of cardiac pacemaker: Secondary | ICD-10-CM

## 2019-01-08 MED ORDER — MEDICAL COMPRESSION STOCKINGS MISC: 1.0000 | 0 refills | Status: DC

## 2019-01-08 NOTE — Progress Notes (Signed)
Virtual Visit via Video Note   This visit type was conducted due to national recommendations for restrictions regarding the COVID-19 Pandemic (e.g. social distancing) in an effort to limit this patient's exposure and mitigate transmission in our community.  Due to her co-morbid illnesses, this patient is at least at moderate risk for complications without adequate follow up.  This format is felt to be most appropriate for this patient at this time.  All issues noted in this document were discussed and addressed.  A limited physical exam was performed with this format.  Please refer to the patient's chart for her consent to telehealth for [facility].   Evaluation Performed:  Follow-up visit  Date:  01/08/2019   ID:  Melanie [name], DOB January 21, 1926, MRN [id number]  Patient Location: [place]  Provider Location: Home  PCP:  Quintin Alto Silvestre Moment, MD  Cardiologist:  Kate Sable, MD  Electrophysiologist:  Thompson Grayer, MD   Chief Complaint:  6 month follow up CAD, PAF  History of Present Illness:    Melanie [name] is a 85 y.o. female who presents via audio/video conferencing for a telehealth visit today.    The patient does not have symptoms concerning for COVID-19 infection (fever, chills, cough, or new shortness of breath).   Mandee Pluta has a past medical history significant for CAD, paroxysmal atrial fibrillation and pacemaker for symptomatic bradycardia. She is currently residing at [facility]. The resident nurse is with her and helps with questions, assessment and labs.   Pt feels occ fast heart beat but does not olst long, a few minutes without symptoms. She denies chest pain/pressure/tightness. She walks in the building without shortness of breath. No lightheadedness or syncope. She has increased foot/ankle edema which she is concerned about. No orthopnea.   She does not add salt ot her food. The nurse says that the provided food seems to be  salty at the facility. She elvtae hser legs and that hleps.   She had taken lasix 40 bid last week per PCP but renal function worsened so back to daily.  She goes to [facility] next door and they do labs. Her niece is a Charity fundraiser there.   Most recent labs at [facility] 12/20/2018 SCr 1.73, BUN 27, K+ 4.5, Na 141  Past Medical History:  Diagnosis Date  . CAD (coronary artery disease)    Two vessel, quiescent; 70% LAD aretery disease and well collateralized, 100% RCA by cardiac cath in 2004; NL LVF; low risk Cardioloite; EF 74%, 11/10  . Dyslipidemia   . Hypercalcemia   . Hypertension   . Hypothyroidism   . Paroxysmal atrial fibrillation (HCC)    on coumadin and amiodarone  . Sinus node dysfunction (HCC)    s/p Medtronic dual-chamber pacemaker  . Vertigo    Past Surgical History:  Procedure Laterality Date  . ABDOMINAL HYSTERECTOMY    . FRACTURE SURGERY    . PACEMAKER GENERATOR CHANGE N/A 02/12/2013   Procedure: PACEMAKER GENERATOR CHANGE;  Surgeon: Deboraha Sprang, MD;  Location: [facility] CATH LAB;  Service: Cardiovascular;  Laterality: N/A;  . PACEMAKER INSERTION  05/24/03   MDT Kappa     Current Meds  Medication Sig  . amiodarone (PACERONE) 200 MG tablet Take 200 mg by mouth daily.  Marland Kitchen amLODipine (NORVASC) 10 MG tablet Take 10 mg by mouth daily.  . clonazePAM (KLONOPIN) 0.5 MG tablet Take 0.5 mg by mouth at bedtime. For restless leg syndrome  . Elastic Bandages & Supports (  MEDICAL COMPRESSION STOCKINGS) MISC 1 each by Does not apply route as directed. Knee Hi Compression Stockings to be measured prior to order-medium pressure Dx: Lower extremity edema & CHF  . ezetimibe (ZETIA) 10 MG tablet Take 10 mg by mouth daily.  . furosemide (LASIX) 40 MG tablet Take 40 mg by mouth daily.  . isosorbide mononitrate (IMDUR) 30 MG 24 hr tablet Take 30 mg by mouth every morning.   Marland Kitchen levothyroxine (SYNTHROID, LEVOTHROID) 75 MCG tablet Take 75 mcg by mouth daily.  . metoprolol succinate  (TOPROL-XL) 25 MG 24 hr tablet Take 25 mg by mouth daily.  . mirtazapine (REMERON) 15 MG tablet Take 15 mg by mouth at bedtime.  . pantoprazole (PROTONIX) 40 MG tablet Take 40 mg by mouth daily.  Marland Kitchen senna (SENOKOT) 8.6 MG tablet Take 2 tablets by mouth daily.  Marland Kitchen warfarin (COUMADIN) 2 MG tablet Take 2 mg by mouth. Take 40m every day except Monday; skip Mondays - or take as directed by MD  . [DISCONTINUED] Elastic Bandages & Supports ([place] Does not apply route.     Allergies:   Ciprofloxacin; Hctz [hydrochlorothiazide]; and Lopid [gemfibrozil]   Social History   Tobacco Use  . Smoking status: Never Smoker  . Smokeless tobacco: Never Used  Substance Use Topics  . Alcohol use: No  . Drug use: No     Family Hx: The patient's family history includes Diabetes in her sister; Hypertension in her sister; Stroke in her brother and father. There is no history of Coronary artery disease.  ROS:   Please see the history of present illness.     All other systems reviewed and are negative.   Prior CV studies:   The following studies were reviewed today:  No recent.   Labs/Other Tests and Data Reviewed:    EKG:  No ECG reviewed.  Recent Labs: No results found for requested labs within last 8760 hours.   Recent Lipid Panel No results found for: CHOL, TRIG, HDL, CHOLHDL, LDLCALC, LDLDIRECT  Wt Readings from Last 3 Encounters:  01/08/19 156 lb (70.8 kg)  06/06/18 141 lb (64 kg)  04/28/18 140 lb 12.8 oz (63.9 kg)     Objective:    Vital Signs:  BP 126/68   Pulse 74   Ht _0  (1.575 m)   Wt 156 lb (70.8 kg)   BMI 28.53 kg/m    Well nourished, well developed, obese female in no acute distress. Pulmonary: Pt able to speak in complete sentences, no audible wheezing or difficulty breathing over the phone. Nurse at ALF reports clear lungs.  Extremities: 1+ left ankle/foot edema, trace right ankle edema. Neuro: pt oriented X4. No focal neuro deficits  reported by pt or noted on video call.  Psych: Pt is pleasant and appropriate mood.   ASSESSMENT & PLAN:    Lower extremity edema -Pt with ankle edema which she says is recent. No DOE or orthonea.  -Lasix had been increased to BID last week by PCP but her SCr increased to 1.73 so was reduced back to daily. Pt reports increased in lasix did not increase her UOP much and did not improve edema.  -Pt is very strict in not adding salt to food and tries to stay away form salty foods. She is on a NAS diet at ALF but pt states that food often tastes salty. -Pt advised to elevate legs whenever possible.  -Will order compression stockings. Reviewed proper wear with pt and the  nurse at ALF. -Pt will be followed by PCP which is adjacent to her ALF for adjustment in her diuretics.  -Reviewed for pt to report if she develops DOE or orthopnea.    CAD -On amlodipine, metoprolol and long acting nitrates. Not on statin any longer. No aspirin due to need for anticoagulation.  -Asymptomatic.   Paroxysmal atrial fibrillation -On amiodarone for rhythm control. Afib burden per PPM 0.7% at last download in 10/2018.  -CHA2DS2/VAS Stroke Risk Score 5 (vasc dz, HTN, age (4), female). Pt is on warfarin for stroke risk reduction. Hgb stable in 08/2018 stable by last labs avail. No unusual bleeing except for minor bleeding with hemorrhoids.  -Pt should have LFTs, PFTs twice a year. Followed by PCP per pt request. Last available labs in 08/2018 showed normal AST, ALT. Sl elevated Alk phos. TSH normal at 4.04. Will need repeat labs in June or when COVID restrictions lifted.  -Pt with occ brief fast heart beat with no associated symptoms.  -continue current therapy.   Hypertension -On amlodipine 10 mg, furosemide 40 mg daily, Imdur 30 mg daily, Toprol XL 25 mg daily. -Last renal function OK with SCr 1.13 in 08/2018.  -BP well controlled. Continue current therapy.   S/P PPM for symptomatic bradycardia -Medtronic PPM  managed by Dr. Rayann Heman.    COVID-19 Education: The signs and symptoms of COVID-19 were discussed with the patient and how to seek care for testing (follow up with PCP or arrange E-visit).  The importance of social distancing was discussed today.  Time:   Today, I have spent 23 minutes with the patient with telehealth technology discussing the above problems.     Medication Adjustments/Labs and Tests Ordered: Current medicines are reviewed at length with the patient today.  Concerns regarding medicines are outlined above.  Tests Ordered: No orders of the defined types were placed in this encounter.  Medication Changes: Meds ordered this encounter  Medications  . Elastic Bandages & Supports (MEDICAL COMPRESSION STOCKINGS) MISC    Sig: 1 each by Does not apply route as directed. Knee Hi Compression Stockings to be measured prior to order-medium pressure Dx: Lower extremity edema & CHF    Dispense:  1 each    Refill:  0    Fax to [facility] [phone]    Disposition:  Follow up in 4 month(s)  Signed, Daune Perch, NP  01/08/2019 11:38 AM    [place]

## 2019-01-08 NOTE — Patient Instructions (Addendum)
Do the following things EVERY DAY:   1. Weigh yourself EVERY morning after you go to the bathroom but before you eat or drink anything. Write this number down in a weight log/diary. If you gain 3 pounds overnight or 5 pounds in a week, call the office.   2. Take your medicines as prescribed. If you have concerns about your medications, please call Melanie [name] before you stop taking them.    3. Eat low salt foods-Limit salt (sodium) to 2000 mg per day. This will help prevent your body from holding onto fluid. Read food labels as many processed foods have a lot of sodium, especially canned goods and prepackaged meats. If you would like some assistance choosing low sodium foods, we would be happy to set you up with a nutritionist.   4. Stay as active as you can everyday. Staying active will give you more energy and make your muscles stronger. Start with 5 minutes at a time and work your way up to 30 minutes a day. Break up your activities--do some in the morning and some in the afternoon. Start with 3 days per week and work your way up to 5 days as you can.  If you have chest pain, feel short of breath, dizzy, or lightheaded, STOP. If you don't feel better after a short rest, call 911. If you do feel better, call the office to let Melanie [name] know you have symptoms with exercise.   5. Limit all fluids for the day to less than 2 liters. Fluid includes all drinks, coffee, juice, ice chips, soup, jello, and all other liquids.    No changes to your medications   You have been given a prescription for compression stockings. [facility]axed to [facility]   No lab work or test ordered today   Your physician recommends that you schedule a follow-up appointment in: 4 months with Melanie [name].    Edema Edema  Un edema se produce cuando hay mucho lquido en el cuerpo o debajo de la piel. Un edema puede hacer que las piernas, los pies y los tobillos se hinchen. La hinchazn tambin es frecuente en los tejidos ms blandos, por  ejemplo, alrededor [employer]. Esta es una enfermedad frecuente. Es ms frecuente a medida que una persona envejece. Hay muchas causas posibles de edema. Consumir grandes cantidades de sal (sodio) y estar de pie o sentado durante mucho tiempo puede causar edema en las piernas, los pies y los tobillos. Cuando hace calor, el edema puede empeorar. Generalmente, el edema es indoloro. La piel puede parecer hinchada o tener un aspecto brilloso. Siga estas indicaciones en su casa:  Cuando est sentado o acostado, mantenga la parte del cuerpo que est hinchada elevada por encima del nivel del corazn.  No se quede quieto ni permanezca de pie durante [employer].  No use ropa ajustada. No use ligas en la parte superior de las piernas.  Ejercite ls piernas. Esto ayuda a Forensic psychologist.  Use vendajes elsticos o medias de compresin segn las indicaciones del mdico.  Lleve una dieta con bajo contenido de sal (baja en sodio) para reducir los lquidos, como se lo haya indicado el mdico.  Dependiendo de la causa de su hinchazn, es posible que deba limitar la cantidad de lquido que bebe (restriccin de lquido).  Tome los medicamentos de venta libre y los recetados solamente como se lo haya indicado el mdico. Comunquese con un mdico si:  El tratamiento no funciona.  Tiene enfermedades cardacas, hepticas o  renales, y observa sntomas de edema.  Aumenta de peso de [place] repentina y sin motivo aparente. Solicite ayuda de inmediato si:  Tiene dificultad para respirar o Therapist, art.  No puede respirar cuando se acuesta.  Tiene dolor, enrojecimiento o calor en las zonas hinchadas.  Tiene una enfermedad cardaca, heptica o renal, y le aparece un edema de repente.  Tiene fiebre y los sntomas empeoran de manera sbita. Resumen  Un edema se produce cuando hay mucho lquido en el cuerpo o debajo de la piel.  Un edema puede hacer que las piernas, los pies y los tobillos se  hinchen. La hinchazn tambin es frecuente en los tejidos ms blandos, por ejemplo, alrededor [employer].  Cuando est sentado o acostado, levante (eleve) la parte del cuerpo que est hinchada por encima del nivel del corazn.  Siga las indicaciones del mdico con respecto a la dieta y a la cantidad de lquido que puede beber (restriccin de lquidos). Esta informacin no tiene Marine scientist el consejo del mdico. Asegrese de hacerle al mdico cualquier pregunta que tenga. Document Released: 07/04/2013 Document Revised: 04/25/2017 Document Reviewed: 04/25/2017 Elsevier Interactive Patient Education  2019 [employer].

## 2019-01-10 DIAGNOSIS — I110 Hypertensive heart disease with heart failure: Secondary | ICD-10-CM | POA: Diagnosis not present

## 2019-01-10 DIAGNOSIS — I2510 Atherosclerotic heart disease of native coronary artery without angina pectoris: Secondary | ICD-10-CM | POA: Diagnosis not present

## 2019-01-10 DIAGNOSIS — E039 Hypothyroidism, unspecified: Secondary | ICD-10-CM | POA: Diagnosis not present

## 2019-01-10 DIAGNOSIS — Z950 Presence of cardiac pacemaker: Secondary | ICD-10-CM | POA: Diagnosis not present

## 2019-01-10 DIAGNOSIS — Z9181 History of falling: Secondary | ICD-10-CM | POA: Diagnosis not present

## 2019-01-10 DIAGNOSIS — R42 Dizziness and giddiness: Secondary | ICD-10-CM | POA: Diagnosis not present

## 2019-01-10 DIAGNOSIS — I480 Paroxysmal atrial fibrillation: Secondary | ICD-10-CM | POA: Diagnosis not present

## 2019-01-10 DIAGNOSIS — Z7901 Long term (current) use of anticoagulants: Secondary | ICD-10-CM | POA: Diagnosis not present

## 2019-01-10 DIAGNOSIS — I509 Heart failure, unspecified: Secondary | ICD-10-CM | POA: Diagnosis not present

## 2019-01-12 DIAGNOSIS — I509 Heart failure, unspecified: Secondary | ICD-10-CM | POA: Diagnosis not present

## 2019-01-12 DIAGNOSIS — I480 Paroxysmal atrial fibrillation: Secondary | ICD-10-CM | POA: Diagnosis not present

## 2019-01-12 DIAGNOSIS — E039 Hypothyroidism, unspecified: Secondary | ICD-10-CM | POA: Diagnosis not present

## 2019-01-12 DIAGNOSIS — R42 Dizziness and giddiness: Secondary | ICD-10-CM | POA: Diagnosis not present

## 2019-01-12 DIAGNOSIS — Z7901 Long term (current) use of anticoagulants: Secondary | ICD-10-CM | POA: Diagnosis not present

## 2019-01-12 DIAGNOSIS — I2510 Atherosclerotic heart disease of native coronary artery without angina pectoris: Secondary | ICD-10-CM | POA: Diagnosis not present

## 2019-01-12 DIAGNOSIS — I110 Hypertensive heart disease with heart failure: Secondary | ICD-10-CM | POA: Diagnosis not present

## 2019-01-12 DIAGNOSIS — Z9181 History of falling: Secondary | ICD-10-CM | POA: Diagnosis not present

## 2019-01-12 DIAGNOSIS — I482 Chronic atrial fibrillation, unspecified: Secondary | ICD-10-CM | POA: Diagnosis not present

## 2019-01-12 DIAGNOSIS — Z950 Presence of cardiac pacemaker: Secondary | ICD-10-CM | POA: Diagnosis not present

## 2019-01-15 DIAGNOSIS — R42 Dizziness and giddiness: Secondary | ICD-10-CM | POA: Diagnosis not present

## 2019-01-15 DIAGNOSIS — I110 Hypertensive heart disease with heart failure: Secondary | ICD-10-CM | POA: Diagnosis not present

## 2019-01-15 DIAGNOSIS — Z7901 Long term (current) use of anticoagulants: Secondary | ICD-10-CM | POA: Diagnosis not present

## 2019-01-15 DIAGNOSIS — R300 Dysuria: Secondary | ICD-10-CM | POA: Diagnosis not present

## 2019-01-15 DIAGNOSIS — I2510 Atherosclerotic heart disease of native coronary artery without angina pectoris: Secondary | ICD-10-CM | POA: Diagnosis not present

## 2019-01-15 DIAGNOSIS — I509 Heart failure, unspecified: Secondary | ICD-10-CM | POA: Diagnosis not present

## 2019-01-15 DIAGNOSIS — Z9181 History of falling: Secondary | ICD-10-CM | POA: Diagnosis not present

## 2019-01-15 DIAGNOSIS — Z950 Presence of cardiac pacemaker: Secondary | ICD-10-CM | POA: Diagnosis not present

## 2019-01-15 DIAGNOSIS — I480 Paroxysmal atrial fibrillation: Secondary | ICD-10-CM | POA: Diagnosis not present

## 2019-01-15 DIAGNOSIS — E039 Hypothyroidism, unspecified: Secondary | ICD-10-CM | POA: Diagnosis not present

## 2019-01-19 DIAGNOSIS — I480 Paroxysmal atrial fibrillation: Secondary | ICD-10-CM | POA: Diagnosis not present

## 2019-01-19 DIAGNOSIS — E039 Hypothyroidism, unspecified: Secondary | ICD-10-CM | POA: Diagnosis not present

## 2019-01-19 DIAGNOSIS — R42 Dizziness and giddiness: Secondary | ICD-10-CM | POA: Diagnosis not present

## 2019-01-19 DIAGNOSIS — Z7901 Long term (current) use of anticoagulants: Secondary | ICD-10-CM | POA: Diagnosis not present

## 2019-01-19 DIAGNOSIS — Z950 Presence of cardiac pacemaker: Secondary | ICD-10-CM | POA: Diagnosis not present

## 2019-01-19 DIAGNOSIS — I509 Heart failure, unspecified: Secondary | ICD-10-CM | POA: Diagnosis not present

## 2019-01-19 DIAGNOSIS — I2510 Atherosclerotic heart disease of native coronary artery without angina pectoris: Secondary | ICD-10-CM | POA: Diagnosis not present

## 2019-01-19 DIAGNOSIS — I110 Hypertensive heart disease with heart failure: Secondary | ICD-10-CM | POA: Diagnosis not present

## 2019-01-19 DIAGNOSIS — Z9181 History of falling: Secondary | ICD-10-CM | POA: Diagnosis not present

## 2019-01-23 DIAGNOSIS — I110 Hypertensive heart disease with heart failure: Secondary | ICD-10-CM | POA: Diagnosis not present

## 2019-01-23 DIAGNOSIS — I509 Heart failure, unspecified: Secondary | ICD-10-CM | POA: Diagnosis not present

## 2019-01-23 DIAGNOSIS — I2510 Atherosclerotic heart disease of native coronary artery without angina pectoris: Secondary | ICD-10-CM | POA: Diagnosis not present

## 2019-01-23 DIAGNOSIS — Z7901 Long term (current) use of anticoagulants: Secondary | ICD-10-CM | POA: Diagnosis not present

## 2019-01-23 DIAGNOSIS — Z9181 History of falling: Secondary | ICD-10-CM | POA: Diagnosis not present

## 2019-01-23 DIAGNOSIS — I480 Paroxysmal atrial fibrillation: Secondary | ICD-10-CM | POA: Diagnosis not present

## 2019-01-23 DIAGNOSIS — R42 Dizziness and giddiness: Secondary | ICD-10-CM | POA: Diagnosis not present

## 2019-01-23 DIAGNOSIS — E039 Hypothyroidism, unspecified: Secondary | ICD-10-CM | POA: Diagnosis not present

## 2019-01-23 DIAGNOSIS — Z950 Presence of cardiac pacemaker: Secondary | ICD-10-CM | POA: Diagnosis not present

## 2019-01-25 DIAGNOSIS — I482 Chronic atrial fibrillation, unspecified: Secondary | ICD-10-CM | POA: Diagnosis not present

## 2019-01-26 DIAGNOSIS — Z9181 History of falling: Secondary | ICD-10-CM | POA: Diagnosis not present

## 2019-01-26 DIAGNOSIS — E039 Hypothyroidism, unspecified: Secondary | ICD-10-CM | POA: Diagnosis not present

## 2019-01-26 DIAGNOSIS — I509 Heart failure, unspecified: Secondary | ICD-10-CM | POA: Diagnosis not present

## 2019-01-26 DIAGNOSIS — Z7901 Long term (current) use of anticoagulants: Secondary | ICD-10-CM | POA: Diagnosis not present

## 2019-01-26 DIAGNOSIS — I110 Hypertensive heart disease with heart failure: Secondary | ICD-10-CM | POA: Diagnosis not present

## 2019-01-26 DIAGNOSIS — I480 Paroxysmal atrial fibrillation: Secondary | ICD-10-CM | POA: Diagnosis not present

## 2019-01-26 DIAGNOSIS — R42 Dizziness and giddiness: Secondary | ICD-10-CM | POA: Diagnosis not present

## 2019-01-26 DIAGNOSIS — I2510 Atherosclerotic heart disease of native coronary artery without angina pectoris: Secondary | ICD-10-CM | POA: Diagnosis not present

## 2019-01-26 DIAGNOSIS — Z950 Presence of cardiac pacemaker: Secondary | ICD-10-CM | POA: Diagnosis not present

## 2019-01-30 DIAGNOSIS — I509 Heart failure, unspecified: Secondary | ICD-10-CM | POA: Diagnosis not present

## 2019-01-30 DIAGNOSIS — I110 Hypertensive heart disease with heart failure: Secondary | ICD-10-CM | POA: Diagnosis not present

## 2019-01-30 DIAGNOSIS — I480 Paroxysmal atrial fibrillation: Secondary | ICD-10-CM | POA: Diagnosis not present

## 2019-01-30 DIAGNOSIS — I2510 Atherosclerotic heart disease of native coronary artery without angina pectoris: Secondary | ICD-10-CM | POA: Diagnosis not present

## 2019-01-30 DIAGNOSIS — R42 Dizziness and giddiness: Secondary | ICD-10-CM | POA: Diagnosis not present

## 2019-01-30 DIAGNOSIS — E039 Hypothyroidism, unspecified: Secondary | ICD-10-CM | POA: Diagnosis not present

## 2019-01-30 DIAGNOSIS — Z9181 History of falling: Secondary | ICD-10-CM | POA: Diagnosis not present

## 2019-01-30 DIAGNOSIS — Z7901 Long term (current) use of anticoagulants: Secondary | ICD-10-CM | POA: Diagnosis not present

## 2019-01-30 DIAGNOSIS — Z950 Presence of cardiac pacemaker: Secondary | ICD-10-CM | POA: Diagnosis not present

## 2019-02-06 DIAGNOSIS — Z7901 Long term (current) use of anticoagulants: Secondary | ICD-10-CM | POA: Diagnosis not present

## 2019-02-06 DIAGNOSIS — I509 Heart failure, unspecified: Secondary | ICD-10-CM | POA: Diagnosis not present

## 2019-02-06 DIAGNOSIS — E039 Hypothyroidism, unspecified: Secondary | ICD-10-CM | POA: Diagnosis not present

## 2019-02-06 DIAGNOSIS — Z9181 History of falling: Secondary | ICD-10-CM | POA: Diagnosis not present

## 2019-02-06 DIAGNOSIS — R42 Dizziness and giddiness: Secondary | ICD-10-CM | POA: Diagnosis not present

## 2019-02-06 DIAGNOSIS — I2510 Atherosclerotic heart disease of native coronary artery without angina pectoris: Secondary | ICD-10-CM | POA: Diagnosis not present

## 2019-02-06 DIAGNOSIS — Z950 Presence of cardiac pacemaker: Secondary | ICD-10-CM | POA: Diagnosis not present

## 2019-02-06 DIAGNOSIS — I110 Hypertensive heart disease with heart failure: Secondary | ICD-10-CM | POA: Diagnosis not present

## 2019-02-06 DIAGNOSIS — I480 Paroxysmal atrial fibrillation: Secondary | ICD-10-CM | POA: Diagnosis not present

## 2019-02-12 ENCOUNTER — Other Ambulatory Visit: Payer: Self-pay

## 2019-02-12 ENCOUNTER — Ambulatory Visit (INDEPENDENT_AMBULATORY_CARE_PROVIDER_SITE_OTHER): Payer: Medicare Other | Admitting: *Deleted

## 2019-02-12 DIAGNOSIS — I480 Paroxysmal atrial fibrillation: Secondary | ICD-10-CM | POA: Diagnosis not present

## 2019-02-13 ENCOUNTER — Telehealth: Payer: Self-pay

## 2019-02-13 NOTE — Telephone Encounter (Signed)
Spoke with patient to remind of missed remote transmission 

## 2019-02-17 LAB — CUP PACEART REMOTE DEVICE CHECK
Battery Impedance: 330 Ohm
Battery Remaining Longevity: 105 mo
Battery Voltage: 2.79 V
Brady Statistic AP VP Percent: 0 %
Brady Statistic AP VS Percent: 94 %
Brady Statistic AS VP Percent: 0 %
Brady Statistic AS VS Percent: 6 %
Date Time Interrogation Session: 20200522142502
Implantable Lead Implant Date: 20040827
Implantable Lead Implant Date: 20040827
Implantable Lead Location: 753859
Implantable Lead Location: 753860
Implantable Lead Model: 4092
Implantable Pulse Generator Implant Date: 20140519
Lead Channel Impedance Value: 489 Ohm
Lead Channel Impedance Value: 895 Ohm
Lead Channel Pacing Threshold Amplitude: 0.5 V
Lead Channel Pacing Threshold Amplitude: 1.5 V
Lead Channel Pacing Threshold Pulse Width: 0.4 ms
Lead Channel Pacing Threshold Pulse Width: 0.4 ms
Lead Channel Setting Pacing Amplitude: 2 V
Lead Channel Setting Pacing Amplitude: 2.5 V
Lead Channel Setting Pacing Pulse Width: 0.4 ms
Lead Channel Setting Sensing Sensitivity: 4 mV

## 2019-02-22 ENCOUNTER — Encounter: Payer: Self-pay | Admitting: Cardiology

## 2019-02-22 NOTE — Progress Notes (Signed)
Remote pacemaker transmission.   

## 2019-03-02 DIAGNOSIS — R739 Hyperglycemia, unspecified: Secondary | ICD-10-CM | POA: Diagnosis not present

## 2019-03-02 DIAGNOSIS — I482 Chronic atrial fibrillation, unspecified: Secondary | ICD-10-CM | POA: Diagnosis not present

## 2019-03-02 DIAGNOSIS — E039 Hypothyroidism, unspecified: Secondary | ICD-10-CM | POA: Diagnosis not present

## 2019-03-02 DIAGNOSIS — K219 Gastro-esophageal reflux disease without esophagitis: Secondary | ICD-10-CM | POA: Diagnosis not present

## 2019-03-17 DIAGNOSIS — Z7901 Long term (current) use of anticoagulants: Secondary | ICD-10-CM | POA: Diagnosis not present

## 2019-03-17 DIAGNOSIS — E7800 Pure hypercholesterolemia, unspecified: Secondary | ICD-10-CM | POA: Diagnosis not present

## 2019-03-17 DIAGNOSIS — K219 Gastro-esophageal reflux disease without esophagitis: Secondary | ICD-10-CM | POA: Diagnosis not present

## 2019-03-17 DIAGNOSIS — E039 Hypothyroidism, unspecified: Secondary | ICD-10-CM | POA: Diagnosis not present

## 2019-03-17 DIAGNOSIS — I4891 Unspecified atrial fibrillation: Secondary | ICD-10-CM | POA: Diagnosis not present

## 2019-03-17 DIAGNOSIS — W06XXXA Fall from bed, initial encounter: Secondary | ICD-10-CM | POA: Diagnosis not present

## 2019-03-17 DIAGNOSIS — S199XXA Unspecified injury of neck, initial encounter: Secondary | ICD-10-CM | POA: Diagnosis not present

## 2019-03-17 DIAGNOSIS — R040 Epistaxis: Secondary | ICD-10-CM | POA: Diagnosis not present

## 2019-03-17 DIAGNOSIS — Z79899 Other long term (current) drug therapy: Secondary | ICD-10-CM | POA: Diagnosis not present

## 2019-03-17 DIAGNOSIS — W19XXXA Unspecified fall, initial encounter: Secondary | ICD-10-CM | POA: Diagnosis not present

## 2019-03-17 DIAGNOSIS — R0902 Hypoxemia: Secondary | ICD-10-CM | POA: Diagnosis not present

## 2019-03-17 DIAGNOSIS — I10 Essential (primary) hypertension: Secondary | ICD-10-CM | POA: Diagnosis not present

## 2019-03-17 DIAGNOSIS — S0990XA Unspecified injury of head, initial encounter: Secondary | ICD-10-CM | POA: Diagnosis not present

## 2019-03-17 DIAGNOSIS — S0993XA Unspecified injury of face, initial encounter: Secondary | ICD-10-CM | POA: Diagnosis not present

## 2019-03-17 DIAGNOSIS — S0083XA Contusion of other part of head, initial encounter: Secondary | ICD-10-CM | POA: Diagnosis not present

## 2019-03-26 DIAGNOSIS — R350 Frequency of micturition: Secondary | ICD-10-CM | POA: Diagnosis not present

## 2019-03-26 DIAGNOSIS — R300 Dysuria: Secondary | ICD-10-CM | POA: Diagnosis not present

## 2019-03-26 DIAGNOSIS — I480 Paroxysmal atrial fibrillation: Secondary | ICD-10-CM | POA: Diagnosis not present

## 2019-03-26 DIAGNOSIS — R339 Retention of urine, unspecified: Secondary | ICD-10-CM | POA: Diagnosis not present

## 2019-03-27 DIAGNOSIS — E039 Hypothyroidism, unspecified: Secondary | ICD-10-CM | POA: Diagnosis not present

## 2019-03-27 DIAGNOSIS — I10 Essential (primary) hypertension: Secondary | ICD-10-CM | POA: Diagnosis not present

## 2019-03-27 DIAGNOSIS — I4891 Unspecified atrial fibrillation: Secondary | ICD-10-CM | POA: Diagnosis not present

## 2019-03-29 DIAGNOSIS — I482 Chronic atrial fibrillation, unspecified: Secondary | ICD-10-CM | POA: Diagnosis not present

## 2019-03-29 DIAGNOSIS — Z7901 Long term (current) use of anticoagulants: Secondary | ICD-10-CM | POA: Diagnosis not present

## 2019-04-02 DIAGNOSIS — I482 Chronic atrial fibrillation, unspecified: Secondary | ICD-10-CM | POA: Diagnosis not present

## 2019-04-05 DIAGNOSIS — I480 Paroxysmal atrial fibrillation: Secondary | ICD-10-CM | POA: Diagnosis not present

## 2019-04-12 DIAGNOSIS — E039 Hypothyroidism, unspecified: Secondary | ICD-10-CM | POA: Diagnosis not present

## 2019-04-12 DIAGNOSIS — R1030 Lower abdominal pain, unspecified: Secondary | ICD-10-CM | POA: Diagnosis not present

## 2019-04-12 DIAGNOSIS — R350 Frequency of micturition: Secondary | ICD-10-CM | POA: Diagnosis not present

## 2019-04-30 DIAGNOSIS — R300 Dysuria: Secondary | ICD-10-CM | POA: Diagnosis not present

## 2019-05-04 ENCOUNTER — Encounter: Payer: Medicare Other | Admitting: Internal Medicine

## 2019-05-04 DIAGNOSIS — I480 Paroxysmal atrial fibrillation: Secondary | ICD-10-CM | POA: Diagnosis not present

## 2019-05-04 DIAGNOSIS — Z7901 Long term (current) use of anticoagulants: Secondary | ICD-10-CM | POA: Diagnosis not present

## 2019-05-14 ENCOUNTER — Encounter: Payer: Self-pay | Admitting: *Deleted

## 2019-05-14 ENCOUNTER — Ambulatory Visit (INDEPENDENT_AMBULATORY_CARE_PROVIDER_SITE_OTHER): Payer: Medicare Other | Admitting: *Deleted

## 2019-05-14 ENCOUNTER — Encounter: Payer: Self-pay | Admitting: Cardiovascular Disease

## 2019-05-14 ENCOUNTER — Telehealth (INDEPENDENT_AMBULATORY_CARE_PROVIDER_SITE_OTHER): Payer: Medicare Other | Admitting: Cardiovascular Disease

## 2019-05-14 VITALS — BP 137/69 | HR 68 | Ht 63.0 in | Wt 158.4 lb

## 2019-05-14 DIAGNOSIS — I495 Sick sinus syndrome: Secondary | ICD-10-CM

## 2019-05-14 DIAGNOSIS — I10 Essential (primary) hypertension: Secondary | ICD-10-CM

## 2019-05-14 DIAGNOSIS — I480 Paroxysmal atrial fibrillation: Secondary | ICD-10-CM

## 2019-05-14 DIAGNOSIS — R600 Localized edema: Secondary | ICD-10-CM

## 2019-05-14 DIAGNOSIS — I25118 Atherosclerotic heart disease of native coronary artery with other forms of angina pectoris: Secondary | ICD-10-CM

## 2019-05-14 DIAGNOSIS — E785 Hyperlipidemia, unspecified: Secondary | ICD-10-CM

## 2019-05-14 DIAGNOSIS — Z950 Presence of cardiac pacemaker: Secondary | ICD-10-CM

## 2019-05-14 DIAGNOSIS — Z7901 Long term (current) use of anticoagulants: Secondary | ICD-10-CM

## 2019-05-14 DIAGNOSIS — Z79899 Other long term (current) drug therapy: Secondary | ICD-10-CM

## 2019-05-14 NOTE — Progress Notes (Signed)
Virtual Visit via Telephone Note   This visit type was conducted due to national recommendations for restrictions regarding the COVID-19 Pandemic (e.g. social distancing) in an effort to limit this patient's exposure and mitigate transmission in our community.  Due to her co-morbid illnesses, this patient is at least at moderate risk for complications without adequate follow up.  This format is felt to be most appropriate for this patient at this time.  The patient did not have access to video technology/had technical difficulties with video requiring transitioning to audio format only (telephone).  All issues noted in this document were discussed and addressed.  No physical exam could be performed with this format.  Please refer to the patient's chart for her  consent to telehealth for Melanie [name].   Date:  05/14/2019   ID:  Melanie [name], DOB 1926/07/25, MRN [id number]  Patient Location: Home Provider Location: Office  PCP:  Manon Hilding, MD  Cardiologist:  Kate Sable, MD  Electrophysiologist:  Thompson Grayer, MD   Evaluation Performed:  Follow-Up Visit  Chief Complaint:  CAD  History of Present Illness:    Melanie [name] is a 85 y.o. female with coronary artery disease, paroxysmal atrial fibrillation, and has a pacemaker for symptomatic bradycardia.  She currently resides at [facility] facility.  I spoke with her nurse, Kristi.  Her niece is a Charity fundraiser at [employer].   She wears TED hose throughout the day.  The patient does not have symptoms concerning for COVID-19 infection (fever, chills, cough, or new shortness of breath).    Past Medical History:  Diagnosis Date  . CAD (coronary artery disease)    Two vessel, quiescent; 70% LAD aretery disease and well collateralized, 100% RCA by cardiac cath in 2004; NL LVF; low risk Cardioloite; EF 74%, 11/10  . Dyslipidemia   . Hypercalcemia   . Hypertension   . Hypothyroidism   . Paroxysmal atrial  fibrillation (HCC)    on coumadin and amiodarone  . Sinus node dysfunction (HCC)    s/p Medtronic dual-chamber pacemaker  . Vertigo    Past Surgical History:  Procedure Laterality Date  . ABDOMINAL HYSTERECTOMY    . FRACTURE SURGERY    . PACEMAKER GENERATOR CHANGE N/A 02/12/2013   Procedure: PACEMAKER GENERATOR CHANGE;  Surgeon: Deboraha Sprang, MD;  Location: [facility] CATH LAB;  Service: Cardiovascular;  Laterality: N/A;  . PACEMAKER INSERTION  05/24/03   MDT Kappa     Current Meds  Medication Sig  . acetaminophen (TYLENOL) 325 MG tablet Take 650 mg by mouth every 6 (six) hours as needed.  Marland Kitchen amiodarone (PACERONE) 200 MG tablet Take 200 mg by mouth daily.  Marland Kitchen amLODipine (NORVASC) 10 MG tablet Take 10 mg by mouth daily.  . cetirizine (ZYRTEC) 10 MG tablet Take 10 mg by mouth daily.  . clonazePAM (KLONOPIN) 0.5 MG tablet Take 0.5 mg by mouth at bedtime. For restless leg syndrome  . Elastic Bandages & Supports (MEDICAL COMPRESSION STOCKINGS) MISC 1 each by Does not apply route as directed. Knee Hi Compression Stockings to be measured prior to order-medium pressure Dx: Lower extremity edema & CHF  . ezetimibe (ZETIA) 10 MG tablet Take 10 mg by mouth daily.  . furosemide (LASIX) 40 MG tablet Take 40 mg by mouth daily.  . isosorbide mononitrate (IMDUR) 30 MG 24 hr tablet Take 30 mg by mouth every morning.   Marland Kitchen levothyroxine (SYNTHROID) 100 MCG tablet Take 100 mcg by mouth daily before breakfast.  . metoprolol  succinate (TOPROL-XL) 25 MG 24 hr tablet Take 25 mg by mouth daily.  . Probiotic Product (PRO-BIOTIC BLEND PO) Take by mouth.  . senna (SENOKOT) 8.6 MG tablet Take 2 tablets by mouth daily.  Marland Kitchen warfarin (COUMADIN) 2 MG tablet Take 2 mg by mouth. Take 2mg  every day except Monday; skip Mondays - or take as directed by MD  . [DISCONTINUED] ezetimibe (ZETIA) 10 MG tablet Take 10 mg by mouth daily.  . [DISCONTINUED] levothyroxine (SYNTHROID, LEVOTHROID) 75 MCG tablet Take 75 mcg by mouth daily.  .  [DISCONTINUED] pantoprazole (PROTONIX) 40 MG tablet Take 40 mg by mouth daily.     Allergies:   Ciprofloxacin, Hctz [hydrochlorothiazide], and Lopid [gemfibrozil]   Social History   Tobacco Use  . Smoking status: Never Smoker  . Smokeless tobacco: Never Used  Substance Use Topics  . Alcohol use: No  . Drug use: No     Family Hx: The patient's family history includes Diabetes in her sister; Hypertension in her sister; Stroke in her brother and father. There is no history of Coronary artery disease.  ROS:   Please see the history of present illness.     All other systems reviewed and are negative.   Prior CV studies:   The following studies were reviewed today:    Labs/Other Tests and Data Reviewed:    EKG:  No ECG reviewed.  Recent Labs: No results found for requested labs within last 8760 hours.   Recent Lipid Panel No results found for: CHOL, TRIG, HDL, CHOLHDL, LDLCALC, LDLDIRECT  Wt Readings from Last 3 Encounters:  05/14/19 158 lb 6.4 oz (71.8 kg)  01/08/19 156 lb (70.8 kg)  06/06/18 141 lb (64 kg)     Objective:    Vital Signs:  BP 137/69   Pulse 68   Ht 5\' 3"  (1.6 m)   Wt 158 lb 6.4 oz (71.8 kg)   BMI 28.06 kg/m    VITAL SIGNS:  reviewed  ASSESSMENT & PLAN:    1. YNW:[id number] stable on current medical therapy which includes amlodipine, metoprolol, and long-acting nitrates. Not on statin. No ASA as she is on warfarin.  2. Essential NGE:XBMWU pressure is normal. No changes.  3. Hyperlipidemia:Not on statin.  4. Paroxysmal atrial fibrillationwith palpitations:Maintained on amiodarone, metoprolol, and warfarin.  I will contact PCP to obtain most recent thyroid and liver function tests.  Denies bleeding problems.  5. Pacemaker:Followed by Dr. Rayann Heman. I reviewed the device interrogation report from 02/16/19 and there was normal device function.  6.  Lower extremity edema: She uses TED hose throughout the day.  No leg swelling at  present.  She takes Lasix as well.    COVID-19 Education: The signs and symptoms of COVID-19 were discussed with the patient and how to seek care for testing (follow up with PCP or arrange E-visit).  The importance of social distancing was discussed today.  Time:   Today, I have spent 15 minutes with the patient with telehealth technology discussing the above problems.     Medication Adjustments/Labs and Tests Ordered: Current medicines are reviewed at length with the patient today.  Concerns regarding medicines are outlined above.   Tests Ordered: No orders of the defined types were placed in this encounter.   Medication Changes: No orders of the defined types were placed in this encounter.   Follow Up:  Virtual Visit in 6 month(s)  Signed, Kate Sable, MD  05/14/2019 10:44 AM    [place]

## 2019-05-14 NOTE — Patient Instructions (Signed)
Your physician wants you to follow-up in: Melanie [name] will receive a reminder letter in the mail two months in advance. If you don't receive a letter, please call our office to schedule the follow-up appointment.  Your physician recommends that you continue on your current medications as directed. Please refer to the Current Medication list given to you today.  Thank you for choosing [facility]!!

## 2019-05-15 ENCOUNTER — Telehealth: Payer: Self-pay

## 2019-05-15 LAB — CUP PACEART REMOTE DEVICE CHECK
Battery Impedance: 379 Ohm
Battery Remaining Longevity: 100 mo
Battery Voltage: 2.8 V
Brady Statistic AP VP Percent: 0 %
Brady Statistic AP VS Percent: 95 %
Brady Statistic AS VP Percent: 0 %
Brady Statistic AS VS Percent: 4 %
Date Time Interrogation Session: 20200818153931
Implantable Lead Implant Date: 20040827
Implantable Lead Implant Date: 20040827
Implantable Lead Location: 753859
Implantable Lead Location: 753860
Implantable Lead Model: 4092
Implantable Pulse Generator Implant Date: 20140519
Lead Channel Impedance Value: 495 Ohm
Lead Channel Impedance Value: 980 Ohm
Lead Channel Pacing Threshold Amplitude: 0.625 V
Lead Channel Pacing Threshold Amplitude: 1.25 V
Lead Channel Pacing Threshold Pulse Width: 0.4 ms
Lead Channel Pacing Threshold Pulse Width: 0.4 ms
Lead Channel Setting Pacing Amplitude: 2 V
Lead Channel Setting Pacing Amplitude: 2.5 V
Lead Channel Setting Pacing Pulse Width: 0.4 ms
Lead Channel Setting Sensing Sensitivity: 4 mV

## 2019-05-15 NOTE — Telephone Encounter (Signed)
Spoke with patient to remind of missed remote transmission 

## 2019-05-22 NOTE — Progress Notes (Signed)
Remote pacemaker transmission.   

## 2019-05-28 DIAGNOSIS — E782 Mixed hyperlipidemia: Secondary | ICD-10-CM | POA: Diagnosis not present

## 2019-05-28 DIAGNOSIS — I10 Essential (primary) hypertension: Secondary | ICD-10-CM | POA: Diagnosis not present

## 2019-05-29 DIAGNOSIS — B351 Tinea unguium: Secondary | ICD-10-CM | POA: Diagnosis not present

## 2019-05-29 DIAGNOSIS — M79675 Pain in left toe(s): Secondary | ICD-10-CM | POA: Diagnosis not present

## 2019-05-29 DIAGNOSIS — E039 Hypothyroidism, unspecified: Secondary | ICD-10-CM | POA: Diagnosis not present

## 2019-05-29 DIAGNOSIS — M79674 Pain in right toe(s): Secondary | ICD-10-CM | POA: Diagnosis not present

## 2019-05-31 ENCOUNTER — Encounter: Payer: Self-pay | Admitting: *Deleted

## 2019-05-31 ENCOUNTER — Telehealth: Payer: Self-pay | Admitting: *Deleted

## 2019-05-31 NOTE — Telephone Encounter (Signed)
Medications and allergies reviewed with patient. Will have vitals available tomorrow before visit.

## 2019-06-01 ENCOUNTER — Telehealth (INDEPENDENT_AMBULATORY_CARE_PROVIDER_SITE_OTHER): Payer: Medicare Other | Admitting: Internal Medicine

## 2019-06-01 ENCOUNTER — Encounter: Payer: Self-pay | Admitting: Internal Medicine

## 2019-06-01 VITALS — BP 138/72 | HR 67 | Ht 63.0 in | Wt 163.0 lb

## 2019-06-01 DIAGNOSIS — I10 Essential (primary) hypertension: Secondary | ICD-10-CM

## 2019-06-01 DIAGNOSIS — I495 Sick sinus syndrome: Secondary | ICD-10-CM

## 2019-06-01 DIAGNOSIS — I480 Paroxysmal atrial fibrillation: Secondary | ICD-10-CM | POA: Diagnosis not present

## 2019-06-01 MED ORDER — AMIODARONE HCL 100 MG PO TABS: 100.0000 mg | ORAL_TABLET | Freq: Every day | ORAL | Status: DC

## 2019-06-01 NOTE — Addendum Note (Signed)
Addended by: Laurine Blazer on: 06/01/2019 03:42 PM   Modules accepted: Orders

## 2019-06-01 NOTE — Progress Notes (Signed)
Electrophysiology TeleHealth Note  Due to national recommendations of social distancing due to [place] 19, an audio telehealth visit is felt to be most appropriate for this patient at this time.  Verbal consent was obtained by me for the telehealth visit today.  The patient does not have capability for a virtual visit.  A phone visit is therefore required today.   Date:  06/01/2019   ID:  Melanie [name], DOB 07/08/1926, MRN [id number]  Location: patient's home  Provider location:  [facility]  Evaluation Performed: Follow-up visit  PCP:  Manon Hilding, MD   Electrophysiologist:  Dr Rayann Heman  Chief Complaint:  palpitations  History of Present Illness:    Melanie [name] is a 85 y.o. female who presents via telehealth conferencing today.  Since last being seen in our clinic, the patient reports doing very well.  She is not very active.  She has chronic back pain and hip pain.  Mobility is limited. She has stable edema.  Today, she denies symptoms of palpitations, chest pain, shortness of breath,  dizziness, presyncope, or syncope.  The patient is otherwise without complaint today.  The patient denies symptoms of fevers, chills, cough, or new SOB worrisome for COVID 19.  Past Medical History:  Diagnosis Date  . CAD (coronary artery disease)    Two vessel, quiescent; 70% LAD aretery disease and well collateralized, 100% RCA by cardiac cath in 2004; NL LVF; low risk Cardioloite; EF 74%, 11/10  . Dyslipidemia   . Hypercalcemia   . Hypertension   . Hypothyroidism   . Paroxysmal atrial fibrillation (HCC)    on coumadin and amiodarone  . Sinus node dysfunction (HCC)    s/p Medtronic dual-chamber pacemaker  . Vertigo     Past Surgical History:  Procedure Laterality Date  . ABDOMINAL HYSTERECTOMY    . FRACTURE SURGERY    . PACEMAKER GENERATOR CHANGE N/A 02/12/2013   Procedure: PACEMAKER GENERATOR CHANGE;  Surgeon: Deboraha Sprang, MD;  Location: [facility] CATH LAB;  Service: Cardiovascular;   Laterality: N/A;  . PACEMAKER INSERTION  05/24/03   MDT [place]    Current Outpatient Medications  Medication Sig Dispense Refill  . acetaminophen (TYLENOL) 325 MG tablet Take 650 mg by mouth every 6 (six) hours as needed.    Marland Kitchen amiodarone (PACERONE) 200 MG tablet Take 200 mg by mouth daily.    Marland Kitchen amLODipine (NORVASC) 10 MG tablet Take 10 mg by mouth daily.    . clonazePAM (KLONOPIN) 0.5 MG tablet Take 0.5 mg by mouth at bedtime. For restless leg syndrome    . Elastic Bandages & Supports (MEDICAL COMPRESSION STOCKINGS) MISC 1 each by Does not apply route as directed. Knee Hi Compression Stockings to be measured prior to order-medium pressure Dx: Lower extremity edema & CHF 1 each 0  . ezetimibe (ZETIA) 10 MG tablet Take 10 mg by mouth daily.    . furosemide (LASIX) 40 MG tablet Take 40 mg by mouth daily.    . isosorbide mononitrate (IMDUR) 30 MG 24 hr tablet Take 30 mg by mouth every morning.     Marland Kitchen levothyroxine (SYNTHROID) 100 MCG tablet Take 100 mcg by mouth daily before breakfast.    . metoprolol succinate (TOPROL-XL) 25 MG 24 hr tablet Take 25 mg by mouth daily.    . Probiotic Product (PRO-BIOTIC BLEND PO) Take by mouth as needed.     . warfarin (COUMADIN) 2 MG tablet Take 2 mg by mouth. Take 2mg  every day except Monday;  skip Mondays - or take as directed by MD     No current facility-administered medications for this visit.     Allergies:   Ciprofloxacin, Hctz [hydrochlorothiazide], and Lopid [gemfibrozil]   Social History:  The patient  reports that she has never smoked. She has never used smokeless tobacco. She reports that she does not drink alcohol or use drugs.   Family History:  The patient's  family history includes Diabetes in her sister; Hypertension in her sister; Stroke in her brother and father.   ROS:  Please see the history of present illness.   All other systems are personally reviewed and negative.    Exam:    Vital Signs:  BP 138/72   Pulse 67   Ht 5\' 3"  (1.6 m)    Wt 163 lb (73.9 kg)   BMI 28.87 kg/m   Well sounding , alert and conversant   Labs/Other Tests and Data Reviewed:    Recent Labs: No results found for requested labs within last 8760 hours.   Wt Readings from Last 3 Encounters:  06/01/19 163 lb (73.9 kg)  05/14/19 158 lb 6.4 oz (71.8 kg)  01/08/19 156 lb (70.8 kg)     Last device remote is reviewed from [place] PDF which reveals normal device function, afib burden is 0.4%   ASSESSMENT & PLAN:    1.  Sinus bradycardia Remotes are uptodate Normal device function by remotes  2. afib Well controlled with amiodarone Reduce amiodarone to 100mg  daily afib burden is 0.4 % (3.1% last year) Continue coumadin for chads2vasc score of 5 She has LFTs, TFTs followed by [facility]  3. HTN Stable No change required today   Follow-up:  Remotes Return in a year   Patient Risk:  after full review of this patients clinical status, I feel that they are at moderate risk at this time.  Today, I have spent 15 minutes with the patient with telehealth technology discussing arrhythmia management .    Army Fossa, MD  06/01/2019 3:03 PM     [address] [place] [address] [phone] (office) [phone] (fax)

## 2019-06-01 NOTE — Patient Instructions (Addendum)
Medication Instructions:   Decrease Amiodarone to 100mg  daily.   Continue all other medications.    Labwork: none  Testing/Procedures: none  Follow-Up: 1 year - Dr. Rayann Heman   Any Other Special Instructions Will Be Listed Below (If Applicable).  If you need a refill on your cardiac medications before your next appointment, please call your pharmacy.

## 2019-06-05 DIAGNOSIS — R300 Dysuria: Secondary | ICD-10-CM | POA: Diagnosis not present

## 2019-06-05 DIAGNOSIS — Z7901 Long term (current) use of anticoagulants: Secondary | ICD-10-CM | POA: Diagnosis not present

## 2019-06-05 DIAGNOSIS — I482 Chronic atrial fibrillation, unspecified: Secondary | ICD-10-CM | POA: Diagnosis not present

## 2019-06-12 DIAGNOSIS — I482 Chronic atrial fibrillation, unspecified: Secondary | ICD-10-CM | POA: Diagnosis not present

## 2019-06-26 DIAGNOSIS — I482 Chronic atrial fibrillation, unspecified: Secondary | ICD-10-CM | POA: Diagnosis not present

## 2019-07-09 DIAGNOSIS — R300 Dysuria: Secondary | ICD-10-CM | POA: Diagnosis not present

## 2019-07-27 DIAGNOSIS — E782 Mixed hyperlipidemia: Secondary | ICD-10-CM | POA: Diagnosis not present

## 2019-07-27 DIAGNOSIS — I482 Chronic atrial fibrillation, unspecified: Secondary | ICD-10-CM | POA: Diagnosis not present

## 2019-07-27 DIAGNOSIS — I10 Essential (primary) hypertension: Secondary | ICD-10-CM | POA: Diagnosis not present

## 2019-07-28 DIAGNOSIS — R300 Dysuria: Secondary | ICD-10-CM | POA: Diagnosis not present

## 2019-08-08 DIAGNOSIS — I482 Chronic atrial fibrillation, unspecified: Secondary | ICD-10-CM | POA: Diagnosis not present

## 2019-08-13 ENCOUNTER — Ambulatory Visit: Payer: Medicare Other | Admitting: *Deleted

## 2019-08-16 LAB — CUP PACEART REMOTE DEVICE CHECK
Battery Impedance: 404 Ohm
Battery Remaining Longevity: 98 mo
Battery Voltage: 2.79 V
Brady Statistic AP VP Percent: 0 %
Brady Statistic AP VS Percent: 96 %
Brady Statistic AS VP Percent: 0 %
Brady Statistic AS VS Percent: 4 %
Date Time Interrogation Session: 20201119120111
Implantable Lead Implant Date: 20040827
Implantable Lead Implant Date: 20040827
Implantable Lead Location: 753859
Implantable Lead Location: 753860
Implantable Lead Model: 4092
Implantable Pulse Generator Implant Date: 20140519
Lead Channel Impedance Value: 482 Ohm
Lead Channel Impedance Value: 890 Ohm
Lead Channel Pacing Threshold Amplitude: 0.625 V
Lead Channel Pacing Threshold Amplitude: 0.875 V
Lead Channel Pacing Threshold Pulse Width: 0.4 ms
Lead Channel Pacing Threshold Pulse Width: 0.4 ms
Lead Channel Setting Pacing Amplitude: 2 V
Lead Channel Setting Pacing Amplitude: 2.5 V
Lead Channel Setting Pacing Pulse Width: 0.4 ms
Lead Channel Setting Sensing Sensitivity: 4 mV

## 2019-08-31 DIAGNOSIS — Z7901 Long term (current) use of anticoagulants: Secondary | ICD-10-CM | POA: Diagnosis not present

## 2019-08-31 DIAGNOSIS — K219 Gastro-esophageal reflux disease without esophagitis: Secondary | ICD-10-CM | POA: Diagnosis not present

## 2019-08-31 DIAGNOSIS — J1289 Other viral pneumonia: Secondary | ICD-10-CM | POA: Diagnosis not present

## 2019-08-31 DIAGNOSIS — J189 Pneumonia, unspecified organism: Secondary | ICD-10-CM | POA: Diagnosis not present

## 2019-08-31 DIAGNOSIS — R197 Diarrhea, unspecified: Secondary | ICD-10-CM | POA: Diagnosis not present

## 2019-08-31 DIAGNOSIS — G894 Chronic pain syndrome: Secondary | ICD-10-CM | POA: Diagnosis not present

## 2019-08-31 DIAGNOSIS — R000 Tachycardia, unspecified: Secondary | ICD-10-CM | POA: Diagnosis not present

## 2019-08-31 DIAGNOSIS — E785 Hyperlipidemia, unspecified: Secondary | ICD-10-CM | POA: Diagnosis not present

## 2019-08-31 DIAGNOSIS — I4811 Longstanding persistent atrial fibrillation: Secondary | ICD-10-CM | POA: Diagnosis not present

## 2019-08-31 DIAGNOSIS — G2581 Restless legs syndrome: Secondary | ICD-10-CM | POA: Diagnosis not present

## 2019-08-31 DIAGNOSIS — Z9981 Dependence on supplemental oxygen: Secondary | ICD-10-CM | POA: Diagnosis not present

## 2019-08-31 DIAGNOSIS — R791 Abnormal coagulation profile: Secondary | ICD-10-CM | POA: Diagnosis not present

## 2019-08-31 DIAGNOSIS — E7800 Pure hypercholesterolemia, unspecified: Secondary | ICD-10-CM | POA: Diagnosis not present

## 2019-08-31 DIAGNOSIS — Z950 Presence of cardiac pacemaker: Secondary | ICD-10-CM | POA: Diagnosis not present

## 2019-08-31 DIAGNOSIS — R5381 Other malaise: Secondary | ICD-10-CM | POA: Diagnosis not present

## 2019-08-31 DIAGNOSIS — M6281 Muscle weakness (generalized): Secondary | ICD-10-CM | POA: Diagnosis not present

## 2019-08-31 DIAGNOSIS — M545 Low back pain: Secondary | ICD-10-CM | POA: Diagnosis not present

## 2019-08-31 DIAGNOSIS — J181 Lobar pneumonia, unspecified organism: Secondary | ICD-10-CM | POA: Diagnosis not present

## 2019-08-31 DIAGNOSIS — I4891 Unspecified atrial fibrillation: Secondary | ICD-10-CM | POA: Diagnosis not present

## 2019-08-31 DIAGNOSIS — I739 Peripheral vascular disease, unspecified: Secondary | ICD-10-CM | POA: Diagnosis not present

## 2019-08-31 DIAGNOSIS — R001 Bradycardia, unspecified: Secondary | ICD-10-CM | POA: Diagnosis not present

## 2019-08-31 DIAGNOSIS — U071 COVID-19: Secondary | ICD-10-CM | POA: Diagnosis not present

## 2019-08-31 DIAGNOSIS — I10 Essential (primary) hypertension: Secondary | ICD-10-CM | POA: Diagnosis not present

## 2019-08-31 DIAGNOSIS — E039 Hypothyroidism, unspecified: Secondary | ICD-10-CM | POA: Diagnosis not present

## 2019-08-31 DIAGNOSIS — R531 Weakness: Secondary | ICD-10-CM | POA: Diagnosis not present

## 2019-08-31 DIAGNOSIS — I110 Hypertensive heart disease with heart failure: Secondary | ICD-10-CM | POA: Diagnosis not present

## 2019-08-31 DIAGNOSIS — D6869 Other thrombophilia: Secondary | ICD-10-CM | POA: Diagnosis not present

## 2019-08-31 DIAGNOSIS — Z743 Need for continuous supervision: Secondary | ICD-10-CM | POA: Diagnosis not present

## 2019-08-31 DIAGNOSIS — Z79899 Other long term (current) drug therapy: Secondary | ICD-10-CM | POA: Diagnosis not present

## 2019-08-31 DIAGNOSIS — R0902 Hypoxemia: Secondary | ICD-10-CM | POA: Diagnosis not present

## 2019-09-02 DIAGNOSIS — U071 COVID-19: Secondary | ICD-10-CM | POA: Diagnosis not present

## 2019-09-02 DIAGNOSIS — I4811 Longstanding persistent atrial fibrillation: Secondary | ICD-10-CM | POA: Diagnosis not present

## 2019-09-02 DIAGNOSIS — R197 Diarrhea, unspecified: Secondary | ICD-10-CM | POA: Diagnosis not present

## 2019-09-02 DIAGNOSIS — I10 Essential (primary) hypertension: Secondary | ICD-10-CM | POA: Diagnosis not present

## 2019-09-06 DIAGNOSIS — J1289 Other viral pneumonia: Secondary | ICD-10-CM | POA: Diagnosis not present

## 2019-09-06 DIAGNOSIS — Z7901 Long term (current) use of anticoagulants: Secondary | ICD-10-CM | POA: Diagnosis not present

## 2019-09-06 DIAGNOSIS — I4891 Unspecified atrial fibrillation: Secondary | ICD-10-CM | POA: Diagnosis not present

## 2019-09-06 DIAGNOSIS — K219 Gastro-esophageal reflux disease without esophagitis: Secondary | ICD-10-CM | POA: Diagnosis not present

## 2019-09-06 DIAGNOSIS — Z79899 Other long term (current) drug therapy: Secondary | ICD-10-CM | POA: Diagnosis not present

## 2019-09-06 DIAGNOSIS — J189 Pneumonia, unspecified organism: Secondary | ICD-10-CM | POA: Diagnosis not present

## 2019-09-06 DIAGNOSIS — I10 Essential (primary) hypertension: Secondary | ICD-10-CM | POA: Diagnosis not present

## 2019-09-06 DIAGNOSIS — J181 Lobar pneumonia, unspecified organism: Secondary | ICD-10-CM | POA: Diagnosis not present

## 2019-09-06 DIAGNOSIS — U071 COVID-19: Secondary | ICD-10-CM | POA: Diagnosis not present

## 2019-09-06 DIAGNOSIS — E7800 Pure hypercholesterolemia, unspecified: Secondary | ICD-10-CM | POA: Diagnosis not present

## 2019-09-06 DIAGNOSIS — E039 Hypothyroidism, unspecified: Secondary | ICD-10-CM | POA: Diagnosis not present

## 2019-09-19 DIAGNOSIS — R001 Bradycardia, unspecified: Secondary | ICD-10-CM | POA: Diagnosis not present

## 2019-09-19 DIAGNOSIS — M6281 Muscle weakness (generalized): Secondary | ICD-10-CM | POA: Diagnosis not present

## 2019-09-19 DIAGNOSIS — Z7901 Long term (current) use of anticoagulants: Secondary | ICD-10-CM | POA: Diagnosis not present

## 2019-09-19 DIAGNOSIS — Z950 Presence of cardiac pacemaker: Secondary | ICD-10-CM | POA: Diagnosis not present

## 2019-09-19 DIAGNOSIS — I10 Essential (primary) hypertension: Secondary | ICD-10-CM | POA: Diagnosis not present

## 2019-09-19 DIAGNOSIS — G894 Chronic pain syndrome: Secondary | ICD-10-CM | POA: Diagnosis not present

## 2019-09-19 DIAGNOSIS — I739 Peripheral vascular disease, unspecified: Secondary | ICD-10-CM | POA: Diagnosis not present

## 2019-09-19 DIAGNOSIS — U071 COVID-19: Secondary | ICD-10-CM | POA: Diagnosis not present

## 2019-09-19 DIAGNOSIS — E039 Hypothyroidism, unspecified: Secondary | ICD-10-CM | POA: Diagnosis not present

## 2019-09-19 DIAGNOSIS — M545 Low back pain: Secondary | ICD-10-CM | POA: Diagnosis not present

## 2019-09-19 DIAGNOSIS — K219 Gastro-esophageal reflux disease without esophagitis: Secondary | ICD-10-CM | POA: Diagnosis not present

## 2019-09-19 DIAGNOSIS — G2581 Restless legs syndrome: Secondary | ICD-10-CM | POA: Diagnosis not present

## 2019-09-19 DIAGNOSIS — I4811 Longstanding persistent atrial fibrillation: Secondary | ICD-10-CM | POA: Diagnosis not present

## 2019-09-19 DIAGNOSIS — Z9981 Dependence on supplemental oxygen: Secondary | ICD-10-CM | POA: Diagnosis not present

## 2019-09-24 DIAGNOSIS — I4811 Longstanding persistent atrial fibrillation: Secondary | ICD-10-CM | POA: Diagnosis not present

## 2019-09-24 DIAGNOSIS — G894 Chronic pain syndrome: Secondary | ICD-10-CM | POA: Diagnosis not present

## 2019-09-24 DIAGNOSIS — R350 Frequency of micturition: Secondary | ICD-10-CM | POA: Diagnosis not present

## 2019-09-24 DIAGNOSIS — M6281 Muscle weakness (generalized): Secondary | ICD-10-CM | POA: Diagnosis not present

## 2019-09-24 DIAGNOSIS — I10 Essential (primary) hypertension: Secondary | ICD-10-CM | POA: Diagnosis not present

## 2019-09-24 DIAGNOSIS — G2581 Restless legs syndrome: Secondary | ICD-10-CM | POA: Diagnosis not present

## 2019-09-24 DIAGNOSIS — R001 Bradycardia, unspecified: Secondary | ICD-10-CM | POA: Diagnosis not present

## 2019-09-24 DIAGNOSIS — Z7901 Long term (current) use of anticoagulants: Secondary | ICD-10-CM | POA: Diagnosis not present

## 2019-09-24 DIAGNOSIS — E039 Hypothyroidism, unspecified: Secondary | ICD-10-CM | POA: Diagnosis not present

## 2019-09-24 DIAGNOSIS — Z950 Presence of cardiac pacemaker: Secondary | ICD-10-CM | POA: Diagnosis not present

## 2019-09-24 DIAGNOSIS — I739 Peripheral vascular disease, unspecified: Secondary | ICD-10-CM | POA: Diagnosis not present

## 2019-09-24 DIAGNOSIS — Z9981 Dependence on supplemental oxygen: Secondary | ICD-10-CM | POA: Diagnosis not present

## 2019-09-24 DIAGNOSIS — U071 COVID-19: Secondary | ICD-10-CM | POA: Diagnosis not present

## 2019-09-24 DIAGNOSIS — M545 Low back pain: Secondary | ICD-10-CM | POA: Diagnosis not present

## 2019-09-26 DIAGNOSIS — Z950 Presence of cardiac pacemaker: Secondary | ICD-10-CM | POA: Diagnosis not present

## 2019-09-26 DIAGNOSIS — Z9981 Dependence on supplemental oxygen: Secondary | ICD-10-CM | POA: Diagnosis not present

## 2019-09-26 DIAGNOSIS — M545 Low back pain: Secondary | ICD-10-CM | POA: Diagnosis not present

## 2019-09-26 DIAGNOSIS — I10 Essential (primary) hypertension: Secondary | ICD-10-CM | POA: Diagnosis not present

## 2019-09-26 DIAGNOSIS — G894 Chronic pain syndrome: Secondary | ICD-10-CM | POA: Diagnosis not present

## 2019-09-26 DIAGNOSIS — Z7901 Long term (current) use of anticoagulants: Secondary | ICD-10-CM | POA: Diagnosis not present

## 2019-09-26 DIAGNOSIS — U071 COVID-19: Secondary | ICD-10-CM | POA: Diagnosis not present

## 2019-09-26 DIAGNOSIS — E039 Hypothyroidism, unspecified: Secondary | ICD-10-CM | POA: Diagnosis not present

## 2019-09-26 DIAGNOSIS — M6281 Muscle weakness (generalized): Secondary | ICD-10-CM | POA: Diagnosis not present

## 2019-09-26 DIAGNOSIS — I4811 Longstanding persistent atrial fibrillation: Secondary | ICD-10-CM | POA: Diagnosis not present

## 2019-09-26 DIAGNOSIS — G2581 Restless legs syndrome: Secondary | ICD-10-CM | POA: Diagnosis not present

## 2019-09-26 DIAGNOSIS — I739 Peripheral vascular disease, unspecified: Secondary | ICD-10-CM | POA: Diagnosis not present

## 2019-09-26 DIAGNOSIS — R001 Bradycardia, unspecified: Secondary | ICD-10-CM | POA: Diagnosis not present

## 2019-09-27 DIAGNOSIS — E039 Hypothyroidism, unspecified: Secondary | ICD-10-CM | POA: Diagnosis not present

## 2019-09-27 DIAGNOSIS — M545 Low back pain: Secondary | ICD-10-CM | POA: Diagnosis not present

## 2019-09-27 DIAGNOSIS — Z9981 Dependence on supplemental oxygen: Secondary | ICD-10-CM | POA: Diagnosis not present

## 2019-09-27 DIAGNOSIS — R001 Bradycardia, unspecified: Secondary | ICD-10-CM | POA: Diagnosis not present

## 2019-09-27 DIAGNOSIS — Z950 Presence of cardiac pacemaker: Secondary | ICD-10-CM | POA: Diagnosis not present

## 2019-09-27 DIAGNOSIS — U071 COVID-19: Secondary | ICD-10-CM | POA: Diagnosis not present

## 2019-09-27 DIAGNOSIS — I10 Essential (primary) hypertension: Secondary | ICD-10-CM | POA: Diagnosis not present

## 2019-09-27 DIAGNOSIS — G2581 Restless legs syndrome: Secondary | ICD-10-CM | POA: Diagnosis not present

## 2019-09-27 DIAGNOSIS — M6281 Muscle weakness (generalized): Secondary | ICD-10-CM | POA: Diagnosis not present

## 2019-09-27 DIAGNOSIS — G894 Chronic pain syndrome: Secondary | ICD-10-CM | POA: Diagnosis not present

## 2019-09-27 DIAGNOSIS — I739 Peripheral vascular disease, unspecified: Secondary | ICD-10-CM | POA: Diagnosis not present

## 2019-09-27 DIAGNOSIS — I4811 Longstanding persistent atrial fibrillation: Secondary | ICD-10-CM | POA: Diagnosis not present

## 2019-09-27 DIAGNOSIS — Z7901 Long term (current) use of anticoagulants: Secondary | ICD-10-CM | POA: Diagnosis not present

## 2019-10-01 DIAGNOSIS — M6281 Muscle weakness (generalized): Secondary | ICD-10-CM | POA: Diagnosis not present

## 2019-10-01 DIAGNOSIS — M545 Low back pain: Secondary | ICD-10-CM | POA: Diagnosis not present

## 2019-10-01 DIAGNOSIS — G894 Chronic pain syndrome: Secondary | ICD-10-CM | POA: Diagnosis not present

## 2019-10-01 DIAGNOSIS — Z7901 Long term (current) use of anticoagulants: Secondary | ICD-10-CM | POA: Diagnosis not present

## 2019-10-01 DIAGNOSIS — I739 Peripheral vascular disease, unspecified: Secondary | ICD-10-CM | POA: Diagnosis not present

## 2019-10-01 DIAGNOSIS — Z9981 Dependence on supplemental oxygen: Secondary | ICD-10-CM | POA: Diagnosis not present

## 2019-10-01 DIAGNOSIS — I10 Essential (primary) hypertension: Secondary | ICD-10-CM | POA: Diagnosis not present

## 2019-10-01 DIAGNOSIS — E039 Hypothyroidism, unspecified: Secondary | ICD-10-CM | POA: Diagnosis not present

## 2019-10-01 DIAGNOSIS — U071 COVID-19: Secondary | ICD-10-CM | POA: Diagnosis not present

## 2019-10-01 DIAGNOSIS — G2581 Restless legs syndrome: Secondary | ICD-10-CM | POA: Diagnosis not present

## 2019-10-01 DIAGNOSIS — I4811 Longstanding persistent atrial fibrillation: Secondary | ICD-10-CM | POA: Diagnosis not present

## 2019-10-01 DIAGNOSIS — R001 Bradycardia, unspecified: Secondary | ICD-10-CM | POA: Diagnosis not present

## 2019-10-01 DIAGNOSIS — Z950 Presence of cardiac pacemaker: Secondary | ICD-10-CM | POA: Diagnosis not present

## 2019-10-02 DIAGNOSIS — Z950 Presence of cardiac pacemaker: Secondary | ICD-10-CM | POA: Diagnosis not present

## 2019-10-02 DIAGNOSIS — R001 Bradycardia, unspecified: Secondary | ICD-10-CM | POA: Diagnosis not present

## 2019-10-02 DIAGNOSIS — G894 Chronic pain syndrome: Secondary | ICD-10-CM | POA: Diagnosis not present

## 2019-10-02 DIAGNOSIS — I4811 Longstanding persistent atrial fibrillation: Secondary | ICD-10-CM | POA: Diagnosis not present

## 2019-10-02 DIAGNOSIS — M545 Low back pain: Secondary | ICD-10-CM | POA: Diagnosis not present

## 2019-10-02 DIAGNOSIS — Z9981 Dependence on supplemental oxygen: Secondary | ICD-10-CM | POA: Diagnosis not present

## 2019-10-02 DIAGNOSIS — M6281 Muscle weakness (generalized): Secondary | ICD-10-CM | POA: Diagnosis not present

## 2019-10-02 DIAGNOSIS — Z7901 Long term (current) use of anticoagulants: Secondary | ICD-10-CM | POA: Diagnosis not present

## 2019-10-02 DIAGNOSIS — G2581 Restless legs syndrome: Secondary | ICD-10-CM | POA: Diagnosis not present

## 2019-10-02 DIAGNOSIS — E039 Hypothyroidism, unspecified: Secondary | ICD-10-CM | POA: Diagnosis not present

## 2019-10-02 DIAGNOSIS — I10 Essential (primary) hypertension: Secondary | ICD-10-CM | POA: Diagnosis not present

## 2019-10-02 DIAGNOSIS — I739 Peripheral vascular disease, unspecified: Secondary | ICD-10-CM | POA: Diagnosis not present

## 2019-10-02 DIAGNOSIS — U071 COVID-19: Secondary | ICD-10-CM | POA: Diagnosis not present

## 2019-10-03 DIAGNOSIS — M545 Low back pain: Secondary | ICD-10-CM | POA: Diagnosis not present

## 2019-10-03 DIAGNOSIS — G2581 Restless legs syndrome: Secondary | ICD-10-CM | POA: Diagnosis not present

## 2019-10-03 DIAGNOSIS — I739 Peripheral vascular disease, unspecified: Secondary | ICD-10-CM | POA: Diagnosis not present

## 2019-10-03 DIAGNOSIS — E039 Hypothyroidism, unspecified: Secondary | ICD-10-CM | POA: Diagnosis not present

## 2019-10-03 DIAGNOSIS — G894 Chronic pain syndrome: Secondary | ICD-10-CM | POA: Diagnosis not present

## 2019-10-03 DIAGNOSIS — I4811 Longstanding persistent atrial fibrillation: Secondary | ICD-10-CM | POA: Diagnosis not present

## 2019-10-03 DIAGNOSIS — M6281 Muscle weakness (generalized): Secondary | ICD-10-CM | POA: Diagnosis not present

## 2019-10-03 DIAGNOSIS — Z9981 Dependence on supplemental oxygen: Secondary | ICD-10-CM | POA: Diagnosis not present

## 2019-10-03 DIAGNOSIS — U071 COVID-19: Secondary | ICD-10-CM | POA: Diagnosis not present

## 2019-10-03 DIAGNOSIS — R001 Bradycardia, unspecified: Secondary | ICD-10-CM | POA: Diagnosis not present

## 2019-10-03 DIAGNOSIS — I10 Essential (primary) hypertension: Secondary | ICD-10-CM | POA: Diagnosis not present

## 2019-10-03 DIAGNOSIS — Z950 Presence of cardiac pacemaker: Secondary | ICD-10-CM | POA: Diagnosis not present

## 2019-10-03 DIAGNOSIS — Z7901 Long term (current) use of anticoagulants: Secondary | ICD-10-CM | POA: Diagnosis not present

## 2019-10-04 DIAGNOSIS — Z7901 Long term (current) use of anticoagulants: Secondary | ICD-10-CM | POA: Diagnosis not present

## 2019-10-04 DIAGNOSIS — I10 Essential (primary) hypertension: Secondary | ICD-10-CM | POA: Diagnosis not present

## 2019-10-04 DIAGNOSIS — Z9981 Dependence on supplemental oxygen: Secondary | ICD-10-CM | POA: Diagnosis not present

## 2019-10-04 DIAGNOSIS — R001 Bradycardia, unspecified: Secondary | ICD-10-CM | POA: Diagnosis not present

## 2019-10-04 DIAGNOSIS — I739 Peripheral vascular disease, unspecified: Secondary | ICD-10-CM | POA: Diagnosis not present

## 2019-10-04 DIAGNOSIS — M6281 Muscle weakness (generalized): Secondary | ICD-10-CM | POA: Diagnosis not present

## 2019-10-04 DIAGNOSIS — E039 Hypothyroidism, unspecified: Secondary | ICD-10-CM | POA: Diagnosis not present

## 2019-10-04 DIAGNOSIS — M545 Low back pain: Secondary | ICD-10-CM | POA: Diagnosis not present

## 2019-10-04 DIAGNOSIS — G2581 Restless legs syndrome: Secondary | ICD-10-CM | POA: Diagnosis not present

## 2019-10-04 DIAGNOSIS — I4811 Longstanding persistent atrial fibrillation: Secondary | ICD-10-CM | POA: Diagnosis not present

## 2019-10-04 DIAGNOSIS — Z950 Presence of cardiac pacemaker: Secondary | ICD-10-CM | POA: Diagnosis not present

## 2019-10-04 DIAGNOSIS — U071 COVID-19: Secondary | ICD-10-CM | POA: Diagnosis not present

## 2019-10-04 DIAGNOSIS — G894 Chronic pain syndrome: Secondary | ICD-10-CM | POA: Diagnosis not present

## 2019-10-05 DIAGNOSIS — G894 Chronic pain syndrome: Secondary | ICD-10-CM | POA: Diagnosis not present

## 2019-10-05 DIAGNOSIS — R001 Bradycardia, unspecified: Secondary | ICD-10-CM | POA: Diagnosis not present

## 2019-10-05 DIAGNOSIS — Z7901 Long term (current) use of anticoagulants: Secondary | ICD-10-CM | POA: Diagnosis not present

## 2019-10-05 DIAGNOSIS — R5383 Other fatigue: Secondary | ICD-10-CM | POA: Diagnosis not present

## 2019-10-05 DIAGNOSIS — U071 COVID-19: Secondary | ICD-10-CM | POA: Diagnosis not present

## 2019-10-05 DIAGNOSIS — E039 Hypothyroidism, unspecified: Secondary | ICD-10-CM | POA: Diagnosis not present

## 2019-10-05 DIAGNOSIS — I739 Peripheral vascular disease, unspecified: Secondary | ICD-10-CM | POA: Diagnosis not present

## 2019-10-05 DIAGNOSIS — M545 Low back pain: Secondary | ICD-10-CM | POA: Diagnosis not present

## 2019-10-05 DIAGNOSIS — Z9981 Dependence on supplemental oxygen: Secondary | ICD-10-CM | POA: Diagnosis not present

## 2019-10-05 DIAGNOSIS — R350 Frequency of micturition: Secondary | ICD-10-CM | POA: Diagnosis not present

## 2019-10-05 DIAGNOSIS — I4811 Longstanding persistent atrial fibrillation: Secondary | ICD-10-CM | POA: Diagnosis not present

## 2019-10-05 DIAGNOSIS — M6281 Muscle weakness (generalized): Secondary | ICD-10-CM | POA: Diagnosis not present

## 2019-10-05 DIAGNOSIS — Z950 Presence of cardiac pacemaker: Secondary | ICD-10-CM | POA: Diagnosis not present

## 2019-10-05 DIAGNOSIS — G2581 Restless legs syndrome: Secondary | ICD-10-CM | POA: Diagnosis not present

## 2019-10-05 DIAGNOSIS — I10 Essential (primary) hypertension: Secondary | ICD-10-CM | POA: Diagnosis not present

## 2019-10-06 DIAGNOSIS — R300 Dysuria: Secondary | ICD-10-CM | POA: Diagnosis not present

## 2019-10-08 DIAGNOSIS — M6281 Muscle weakness (generalized): Secondary | ICD-10-CM | POA: Diagnosis not present

## 2019-10-08 DIAGNOSIS — I739 Peripheral vascular disease, unspecified: Secondary | ICD-10-CM | POA: Diagnosis not present

## 2019-10-08 DIAGNOSIS — M545 Low back pain: Secondary | ICD-10-CM | POA: Diagnosis not present

## 2019-10-08 DIAGNOSIS — I4811 Longstanding persistent atrial fibrillation: Secondary | ICD-10-CM | POA: Diagnosis not present

## 2019-10-08 DIAGNOSIS — U071 COVID-19: Secondary | ICD-10-CM | POA: Diagnosis not present

## 2019-10-08 DIAGNOSIS — E039 Hypothyroidism, unspecified: Secondary | ICD-10-CM | POA: Diagnosis not present

## 2019-10-08 DIAGNOSIS — Z950 Presence of cardiac pacemaker: Secondary | ICD-10-CM | POA: Diagnosis not present

## 2019-10-08 DIAGNOSIS — R001 Bradycardia, unspecified: Secondary | ICD-10-CM | POA: Diagnosis not present

## 2019-10-08 DIAGNOSIS — G894 Chronic pain syndrome: Secondary | ICD-10-CM | POA: Diagnosis not present

## 2019-10-08 DIAGNOSIS — Z9981 Dependence on supplemental oxygen: Secondary | ICD-10-CM | POA: Diagnosis not present

## 2019-10-08 DIAGNOSIS — G2581 Restless legs syndrome: Secondary | ICD-10-CM | POA: Diagnosis not present

## 2019-10-08 DIAGNOSIS — Z7901 Long term (current) use of anticoagulants: Secondary | ICD-10-CM | POA: Diagnosis not present

## 2019-10-08 DIAGNOSIS — I10 Essential (primary) hypertension: Secondary | ICD-10-CM | POA: Diagnosis not present

## 2019-10-10 DIAGNOSIS — Z9981 Dependence on supplemental oxygen: Secondary | ICD-10-CM | POA: Diagnosis not present

## 2019-10-10 DIAGNOSIS — I4811 Longstanding persistent atrial fibrillation: Secondary | ICD-10-CM | POA: Diagnosis not present

## 2019-10-10 DIAGNOSIS — E039 Hypothyroidism, unspecified: Secondary | ICD-10-CM | POA: Diagnosis not present

## 2019-10-10 DIAGNOSIS — U071 COVID-19: Secondary | ICD-10-CM | POA: Diagnosis not present

## 2019-10-10 DIAGNOSIS — Z950 Presence of cardiac pacemaker: Secondary | ICD-10-CM | POA: Diagnosis not present

## 2019-10-10 DIAGNOSIS — M545 Low back pain: Secondary | ICD-10-CM | POA: Diagnosis not present

## 2019-10-10 DIAGNOSIS — Z7901 Long term (current) use of anticoagulants: Secondary | ICD-10-CM | POA: Diagnosis not present

## 2019-10-10 DIAGNOSIS — R001 Bradycardia, unspecified: Secondary | ICD-10-CM | POA: Diagnosis not present

## 2019-10-10 DIAGNOSIS — G894 Chronic pain syndrome: Secondary | ICD-10-CM | POA: Diagnosis not present

## 2019-10-10 DIAGNOSIS — I10 Essential (primary) hypertension: Secondary | ICD-10-CM | POA: Diagnosis not present

## 2019-10-10 DIAGNOSIS — G2581 Restless legs syndrome: Secondary | ICD-10-CM | POA: Diagnosis not present

## 2019-10-10 DIAGNOSIS — I739 Peripheral vascular disease, unspecified: Secondary | ICD-10-CM | POA: Diagnosis not present

## 2019-10-10 DIAGNOSIS — M6281 Muscle weakness (generalized): Secondary | ICD-10-CM | POA: Diagnosis not present

## 2019-10-12 DIAGNOSIS — U071 COVID-19: Secondary | ICD-10-CM | POA: Diagnosis not present

## 2019-10-12 DIAGNOSIS — I4811 Longstanding persistent atrial fibrillation: Secondary | ICD-10-CM | POA: Diagnosis not present

## 2019-10-12 DIAGNOSIS — M545 Low back pain: Secondary | ICD-10-CM | POA: Diagnosis not present

## 2019-10-12 DIAGNOSIS — I739 Peripheral vascular disease, unspecified: Secondary | ICD-10-CM | POA: Diagnosis not present

## 2019-10-12 DIAGNOSIS — I10 Essential (primary) hypertension: Secondary | ICD-10-CM | POA: Diagnosis not present

## 2019-10-12 DIAGNOSIS — G894 Chronic pain syndrome: Secondary | ICD-10-CM | POA: Diagnosis not present

## 2019-10-12 DIAGNOSIS — G2581 Restless legs syndrome: Secondary | ICD-10-CM | POA: Diagnosis not present

## 2019-10-12 DIAGNOSIS — Z9981 Dependence on supplemental oxygen: Secondary | ICD-10-CM | POA: Diagnosis not present

## 2019-10-12 DIAGNOSIS — R001 Bradycardia, unspecified: Secondary | ICD-10-CM | POA: Diagnosis not present

## 2019-10-12 DIAGNOSIS — E039 Hypothyroidism, unspecified: Secondary | ICD-10-CM | POA: Diagnosis not present

## 2019-10-12 DIAGNOSIS — Z7901 Long term (current) use of anticoagulants: Secondary | ICD-10-CM | POA: Diagnosis not present

## 2019-10-12 DIAGNOSIS — M6281 Muscle weakness (generalized): Secondary | ICD-10-CM | POA: Diagnosis not present

## 2019-10-12 DIAGNOSIS — Z950 Presence of cardiac pacemaker: Secondary | ICD-10-CM | POA: Diagnosis not present

## 2019-10-15 DIAGNOSIS — I10 Essential (primary) hypertension: Secondary | ICD-10-CM | POA: Diagnosis not present

## 2019-10-15 DIAGNOSIS — R001 Bradycardia, unspecified: Secondary | ICD-10-CM | POA: Diagnosis not present

## 2019-10-15 DIAGNOSIS — G894 Chronic pain syndrome: Secondary | ICD-10-CM | POA: Diagnosis not present

## 2019-10-15 DIAGNOSIS — M545 Low back pain: Secondary | ICD-10-CM | POA: Diagnosis not present

## 2019-10-15 DIAGNOSIS — G2581 Restless legs syndrome: Secondary | ICD-10-CM | POA: Diagnosis not present

## 2019-10-15 DIAGNOSIS — Z950 Presence of cardiac pacemaker: Secondary | ICD-10-CM | POA: Diagnosis not present

## 2019-10-15 DIAGNOSIS — Z7901 Long term (current) use of anticoagulants: Secondary | ICD-10-CM | POA: Diagnosis not present

## 2019-10-15 DIAGNOSIS — U071 COVID-19: Secondary | ICD-10-CM | POA: Diagnosis not present

## 2019-10-15 DIAGNOSIS — M6281 Muscle weakness (generalized): Secondary | ICD-10-CM | POA: Diagnosis not present

## 2019-10-15 DIAGNOSIS — E039 Hypothyroidism, unspecified: Secondary | ICD-10-CM | POA: Diagnosis not present

## 2019-10-15 DIAGNOSIS — I4811 Longstanding persistent atrial fibrillation: Secondary | ICD-10-CM | POA: Diagnosis not present

## 2019-10-15 DIAGNOSIS — Z9981 Dependence on supplemental oxygen: Secondary | ICD-10-CM | POA: Diagnosis not present

## 2019-10-15 DIAGNOSIS — I739 Peripheral vascular disease, unspecified: Secondary | ICD-10-CM | POA: Diagnosis not present

## 2019-10-17 DIAGNOSIS — G2581 Restless legs syndrome: Secondary | ICD-10-CM | POA: Diagnosis not present

## 2019-10-17 DIAGNOSIS — E039 Hypothyroidism, unspecified: Secondary | ICD-10-CM | POA: Diagnosis not present

## 2019-10-17 DIAGNOSIS — I4811 Longstanding persistent atrial fibrillation: Secondary | ICD-10-CM | POA: Diagnosis not present

## 2019-10-17 DIAGNOSIS — Z9981 Dependence on supplemental oxygen: Secondary | ICD-10-CM | POA: Diagnosis not present

## 2019-10-17 DIAGNOSIS — Z7901 Long term (current) use of anticoagulants: Secondary | ICD-10-CM | POA: Diagnosis not present

## 2019-10-17 DIAGNOSIS — I480 Paroxysmal atrial fibrillation: Secondary | ICD-10-CM | POA: Diagnosis not present

## 2019-10-17 DIAGNOSIS — I10 Essential (primary) hypertension: Secondary | ICD-10-CM | POA: Diagnosis not present

## 2019-10-17 DIAGNOSIS — Z950 Presence of cardiac pacemaker: Secondary | ICD-10-CM | POA: Diagnosis not present

## 2019-10-17 DIAGNOSIS — U071 COVID-19: Secondary | ICD-10-CM | POA: Diagnosis not present

## 2019-10-17 DIAGNOSIS — R001 Bradycardia, unspecified: Secondary | ICD-10-CM | POA: Diagnosis not present

## 2019-10-17 DIAGNOSIS — M6281 Muscle weakness (generalized): Secondary | ICD-10-CM | POA: Diagnosis not present

## 2019-10-17 DIAGNOSIS — G894 Chronic pain syndrome: Secondary | ICD-10-CM | POA: Diagnosis not present

## 2019-10-17 DIAGNOSIS — M545 Low back pain: Secondary | ICD-10-CM | POA: Diagnosis not present

## 2019-10-17 DIAGNOSIS — I739 Peripheral vascular disease, unspecified: Secondary | ICD-10-CM | POA: Diagnosis not present

## 2019-10-18 DIAGNOSIS — G2581 Restless legs syndrome: Secondary | ICD-10-CM | POA: Diagnosis not present

## 2019-10-18 DIAGNOSIS — I4811 Longstanding persistent atrial fibrillation: Secondary | ICD-10-CM | POA: Diagnosis not present

## 2019-10-18 DIAGNOSIS — G894 Chronic pain syndrome: Secondary | ICD-10-CM | POA: Diagnosis not present

## 2019-10-18 DIAGNOSIS — M545 Low back pain: Secondary | ICD-10-CM | POA: Diagnosis not present

## 2019-10-18 DIAGNOSIS — R300 Dysuria: Secondary | ICD-10-CM | POA: Diagnosis not present

## 2019-10-18 DIAGNOSIS — Z7901 Long term (current) use of anticoagulants: Secondary | ICD-10-CM | POA: Diagnosis not present

## 2019-10-18 DIAGNOSIS — R001 Bradycardia, unspecified: Secondary | ICD-10-CM | POA: Diagnosis not present

## 2019-10-18 DIAGNOSIS — U071 COVID-19: Secondary | ICD-10-CM | POA: Diagnosis not present

## 2019-10-18 DIAGNOSIS — E039 Hypothyroidism, unspecified: Secondary | ICD-10-CM | POA: Diagnosis not present

## 2019-10-18 DIAGNOSIS — M6281 Muscle weakness (generalized): Secondary | ICD-10-CM | POA: Diagnosis not present

## 2019-10-18 DIAGNOSIS — I10 Essential (primary) hypertension: Secondary | ICD-10-CM | POA: Diagnosis not present

## 2019-10-18 DIAGNOSIS — I739 Peripheral vascular disease, unspecified: Secondary | ICD-10-CM | POA: Diagnosis not present

## 2019-10-18 DIAGNOSIS — Z9981 Dependence on supplemental oxygen: Secondary | ICD-10-CM | POA: Diagnosis not present

## 2019-10-18 DIAGNOSIS — Z950 Presence of cardiac pacemaker: Secondary | ICD-10-CM | POA: Diagnosis not present

## 2019-10-19 DIAGNOSIS — R350 Frequency of micturition: Secondary | ICD-10-CM | POA: Diagnosis not present

## 2019-10-19 DIAGNOSIS — U071 COVID-19: Secondary | ICD-10-CM | POA: Diagnosis not present

## 2019-10-22 DIAGNOSIS — U071 COVID-19: Secondary | ICD-10-CM | POA: Diagnosis not present

## 2019-10-22 DIAGNOSIS — R001 Bradycardia, unspecified: Secondary | ICD-10-CM | POA: Diagnosis not present

## 2019-10-22 DIAGNOSIS — M6281 Muscle weakness (generalized): Secondary | ICD-10-CM | POA: Diagnosis not present

## 2019-10-22 DIAGNOSIS — Z7901 Long term (current) use of anticoagulants: Secondary | ICD-10-CM | POA: Diagnosis not present

## 2019-10-22 DIAGNOSIS — I10 Essential (primary) hypertension: Secondary | ICD-10-CM | POA: Diagnosis not present

## 2019-10-22 DIAGNOSIS — G2581 Restless legs syndrome: Secondary | ICD-10-CM | POA: Diagnosis not present

## 2019-10-22 DIAGNOSIS — I4811 Longstanding persistent atrial fibrillation: Secondary | ICD-10-CM | POA: Diagnosis not present

## 2019-10-22 DIAGNOSIS — M545 Low back pain: Secondary | ICD-10-CM | POA: Diagnosis not present

## 2019-10-22 DIAGNOSIS — G894 Chronic pain syndrome: Secondary | ICD-10-CM | POA: Diagnosis not present

## 2019-10-22 DIAGNOSIS — Z950 Presence of cardiac pacemaker: Secondary | ICD-10-CM | POA: Diagnosis not present

## 2019-10-22 DIAGNOSIS — Z9981 Dependence on supplemental oxygen: Secondary | ICD-10-CM | POA: Diagnosis not present

## 2019-10-22 DIAGNOSIS — I739 Peripheral vascular disease, unspecified: Secondary | ICD-10-CM | POA: Diagnosis not present

## 2019-10-22 DIAGNOSIS — E039 Hypothyroidism, unspecified: Secondary | ICD-10-CM | POA: Diagnosis not present

## 2019-10-23 DIAGNOSIS — Z950 Presence of cardiac pacemaker: Secondary | ICD-10-CM | POA: Diagnosis not present

## 2019-10-23 DIAGNOSIS — Z7901 Long term (current) use of anticoagulants: Secondary | ICD-10-CM | POA: Diagnosis not present

## 2019-10-23 DIAGNOSIS — U071 COVID-19: Secondary | ICD-10-CM | POA: Diagnosis not present

## 2019-10-23 DIAGNOSIS — M6281 Muscle weakness (generalized): Secondary | ICD-10-CM | POA: Diagnosis not present

## 2019-10-23 DIAGNOSIS — I4811 Longstanding persistent atrial fibrillation: Secondary | ICD-10-CM | POA: Diagnosis not present

## 2019-10-23 DIAGNOSIS — M545 Low back pain: Secondary | ICD-10-CM | POA: Diagnosis not present

## 2019-10-23 DIAGNOSIS — R001 Bradycardia, unspecified: Secondary | ICD-10-CM | POA: Diagnosis not present

## 2019-10-23 DIAGNOSIS — G2581 Restless legs syndrome: Secondary | ICD-10-CM | POA: Diagnosis not present

## 2019-10-23 DIAGNOSIS — Z9981 Dependence on supplemental oxygen: Secondary | ICD-10-CM | POA: Diagnosis not present

## 2019-10-23 DIAGNOSIS — I739 Peripheral vascular disease, unspecified: Secondary | ICD-10-CM | POA: Diagnosis not present

## 2019-10-23 DIAGNOSIS — I10 Essential (primary) hypertension: Secondary | ICD-10-CM | POA: Diagnosis not present

## 2019-10-23 DIAGNOSIS — E039 Hypothyroidism, unspecified: Secondary | ICD-10-CM | POA: Diagnosis not present

## 2019-10-23 DIAGNOSIS — G894 Chronic pain syndrome: Secondary | ICD-10-CM | POA: Diagnosis not present

## 2019-10-24 DIAGNOSIS — G894 Chronic pain syndrome: Secondary | ICD-10-CM | POA: Diagnosis not present

## 2019-10-24 DIAGNOSIS — G2581 Restless legs syndrome: Secondary | ICD-10-CM | POA: Diagnosis not present

## 2019-10-24 DIAGNOSIS — I739 Peripheral vascular disease, unspecified: Secondary | ICD-10-CM | POA: Diagnosis not present

## 2019-10-24 DIAGNOSIS — I10 Essential (primary) hypertension: Secondary | ICD-10-CM | POA: Diagnosis not present

## 2019-10-24 DIAGNOSIS — I4811 Longstanding persistent atrial fibrillation: Secondary | ICD-10-CM | POA: Diagnosis not present

## 2019-10-24 DIAGNOSIS — U071 COVID-19: Secondary | ICD-10-CM | POA: Diagnosis not present

## 2019-10-24 DIAGNOSIS — Z7901 Long term (current) use of anticoagulants: Secondary | ICD-10-CM | POA: Diagnosis not present

## 2019-10-24 DIAGNOSIS — E039 Hypothyroidism, unspecified: Secondary | ICD-10-CM | POA: Diagnosis not present

## 2019-10-24 DIAGNOSIS — M6281 Muscle weakness (generalized): Secondary | ICD-10-CM | POA: Diagnosis not present

## 2019-10-24 DIAGNOSIS — Z9981 Dependence on supplemental oxygen: Secondary | ICD-10-CM | POA: Diagnosis not present

## 2019-10-24 DIAGNOSIS — M545 Low back pain: Secondary | ICD-10-CM | POA: Diagnosis not present

## 2019-10-24 DIAGNOSIS — Z950 Presence of cardiac pacemaker: Secondary | ICD-10-CM | POA: Diagnosis not present

## 2019-10-24 DIAGNOSIS — R001 Bradycardia, unspecified: Secondary | ICD-10-CM | POA: Diagnosis not present

## 2019-10-26 DIAGNOSIS — E7849 Other hyperlipidemia: Secondary | ICD-10-CM | POA: Diagnosis not present

## 2019-10-26 DIAGNOSIS — I482 Chronic atrial fibrillation, unspecified: Secondary | ICD-10-CM | POA: Diagnosis not present

## 2019-10-29 DIAGNOSIS — M6281 Muscle weakness (generalized): Secondary | ICD-10-CM | POA: Diagnosis not present

## 2019-10-29 DIAGNOSIS — G894 Chronic pain syndrome: Secondary | ICD-10-CM | POA: Diagnosis not present

## 2019-10-29 DIAGNOSIS — I4811 Longstanding persistent atrial fibrillation: Secondary | ICD-10-CM | POA: Diagnosis not present

## 2019-10-29 DIAGNOSIS — R001 Bradycardia, unspecified: Secondary | ICD-10-CM | POA: Diagnosis not present

## 2019-10-29 DIAGNOSIS — G2581 Restless legs syndrome: Secondary | ICD-10-CM | POA: Diagnosis not present

## 2019-10-29 DIAGNOSIS — Z7901 Long term (current) use of anticoagulants: Secondary | ICD-10-CM | POA: Diagnosis not present

## 2019-10-29 DIAGNOSIS — E039 Hypothyroidism, unspecified: Secondary | ICD-10-CM | POA: Diagnosis not present

## 2019-10-29 DIAGNOSIS — Z950 Presence of cardiac pacemaker: Secondary | ICD-10-CM | POA: Diagnosis not present

## 2019-10-29 DIAGNOSIS — Z9981 Dependence on supplemental oxygen: Secondary | ICD-10-CM | POA: Diagnosis not present

## 2019-10-29 DIAGNOSIS — M545 Low back pain: Secondary | ICD-10-CM | POA: Diagnosis not present

## 2019-10-29 DIAGNOSIS — I10 Essential (primary) hypertension: Secondary | ICD-10-CM | POA: Diagnosis not present

## 2019-10-29 DIAGNOSIS — I739 Peripheral vascular disease, unspecified: Secondary | ICD-10-CM | POA: Diagnosis not present

## 2019-11-02 DIAGNOSIS — M6281 Muscle weakness (generalized): Secondary | ICD-10-CM | POA: Diagnosis not present

## 2019-11-02 DIAGNOSIS — M545 Low back pain: Secondary | ICD-10-CM | POA: Diagnosis not present

## 2019-11-02 DIAGNOSIS — I4811 Longstanding persistent atrial fibrillation: Secondary | ICD-10-CM | POA: Diagnosis not present

## 2019-11-02 DIAGNOSIS — G2581 Restless legs syndrome: Secondary | ICD-10-CM | POA: Diagnosis not present

## 2019-11-02 DIAGNOSIS — Z950 Presence of cardiac pacemaker: Secondary | ICD-10-CM | POA: Diagnosis not present

## 2019-11-02 DIAGNOSIS — E039 Hypothyroidism, unspecified: Secondary | ICD-10-CM | POA: Diagnosis not present

## 2019-11-02 DIAGNOSIS — Z9981 Dependence on supplemental oxygen: Secondary | ICD-10-CM | POA: Diagnosis not present

## 2019-11-02 DIAGNOSIS — Z7901 Long term (current) use of anticoagulants: Secondary | ICD-10-CM | POA: Diagnosis not present

## 2019-11-02 DIAGNOSIS — R001 Bradycardia, unspecified: Secondary | ICD-10-CM | POA: Diagnosis not present

## 2019-11-02 DIAGNOSIS — I10 Essential (primary) hypertension: Secondary | ICD-10-CM | POA: Diagnosis not present

## 2019-11-02 DIAGNOSIS — I739 Peripheral vascular disease, unspecified: Secondary | ICD-10-CM | POA: Diagnosis not present

## 2019-11-02 DIAGNOSIS — G894 Chronic pain syndrome: Secondary | ICD-10-CM | POA: Diagnosis not present

## 2019-11-06 DIAGNOSIS — G2581 Restless legs syndrome: Secondary | ICD-10-CM | POA: Diagnosis not present

## 2019-11-06 DIAGNOSIS — I739 Peripheral vascular disease, unspecified: Secondary | ICD-10-CM | POA: Diagnosis not present

## 2019-11-06 DIAGNOSIS — G894 Chronic pain syndrome: Secondary | ICD-10-CM | POA: Diagnosis not present

## 2019-11-06 DIAGNOSIS — R001 Bradycardia, unspecified: Secondary | ICD-10-CM | POA: Diagnosis not present

## 2019-11-06 DIAGNOSIS — Z950 Presence of cardiac pacemaker: Secondary | ICD-10-CM | POA: Diagnosis not present

## 2019-11-06 DIAGNOSIS — M545 Low back pain: Secondary | ICD-10-CM | POA: Diagnosis not present

## 2019-11-06 DIAGNOSIS — I4811 Longstanding persistent atrial fibrillation: Secondary | ICD-10-CM | POA: Diagnosis not present

## 2019-11-06 DIAGNOSIS — Z9981 Dependence on supplemental oxygen: Secondary | ICD-10-CM | POA: Diagnosis not present

## 2019-11-06 DIAGNOSIS — I10 Essential (primary) hypertension: Secondary | ICD-10-CM | POA: Diagnosis not present

## 2019-11-06 DIAGNOSIS — M6281 Muscle weakness (generalized): Secondary | ICD-10-CM | POA: Diagnosis not present

## 2019-11-06 DIAGNOSIS — Z7901 Long term (current) use of anticoagulants: Secondary | ICD-10-CM | POA: Diagnosis not present

## 2019-11-06 DIAGNOSIS — E039 Hypothyroidism, unspecified: Secondary | ICD-10-CM | POA: Diagnosis not present

## 2019-11-12 ENCOUNTER — Other Ambulatory Visit: Payer: Self-pay

## 2019-11-19 DIAGNOSIS — K219 Gastro-esophageal reflux disease without esophagitis: Secondary | ICD-10-CM | POA: Diagnosis not present

## 2019-11-19 DIAGNOSIS — R300 Dysuria: Secondary | ICD-10-CM | POA: Diagnosis not present

## 2019-11-19 DIAGNOSIS — I482 Chronic atrial fibrillation, unspecified: Secondary | ICD-10-CM | POA: Diagnosis not present

## 2019-11-19 DIAGNOSIS — U071 COVID-19: Secondary | ICD-10-CM | POA: Diagnosis not present

## 2019-11-19 DIAGNOSIS — Z13220 Encounter for screening for lipoid disorders: Secondary | ICD-10-CM | POA: Diagnosis not present

## 2019-11-19 DIAGNOSIS — Z950 Presence of cardiac pacemaker: Secondary | ICD-10-CM | POA: Diagnosis not present

## 2019-11-23 ENCOUNTER — Telehealth: Payer: Self-pay | Admitting: Internal Medicine

## 2019-11-23 ENCOUNTER — Ambulatory Visit (INDEPENDENT_AMBULATORY_CARE_PROVIDER_SITE_OTHER): Payer: Medicare Other | Admitting: *Deleted

## 2019-11-23 DIAGNOSIS — E039 Hypothyroidism, unspecified: Secondary | ICD-10-CM | POA: Diagnosis not present

## 2019-11-23 DIAGNOSIS — Z950 Presence of cardiac pacemaker: Secondary | ICD-10-CM | POA: Diagnosis not present

## 2019-11-23 DIAGNOSIS — I10 Essential (primary) hypertension: Secondary | ICD-10-CM | POA: Diagnosis not present

## 2019-11-23 NOTE — Telephone Encounter (Signed)
Await further needs.

## 2019-11-23 NOTE — Telephone Encounter (Signed)
Spoke with pt, assisted with manual transmission.  Pt does not report any current symptoms only that her HR was elevated this AM.    Current rate showing around 80-85, last Atrial HR episode noted was 2/16, no HVR episodes noted.

## 2019-11-23 NOTE — Telephone Encounter (Signed)
STAT if HR is under 50 or over 120 (normal HR is 60-100 beats per minute)  1) What is your heart rate? 100 (estimate)  2) Do you have a log of your heart rate readings (document readings)? No  3) Do you have any other symptoms? No

## 2019-11-23 NOTE — Telephone Encounter (Signed)
Call back received from Pt.  Pt is concerned because she saw her PCP recently and per the Pt the PCP said something was wrong with her pacemaker.  Advised Pt I will contact her PCP and get her last OV note.  Tried to call that office and it is closed.  Advised Pt I would get notes on Monday and call her as soon as I got them.  Provided reassurance that her pacemaker is working normally.  Will contact PCP [phone]

## 2019-11-24 LAB — CUP PACEART REMOTE DEVICE CHECK
Battery Impedance: 452 Ohm
Battery Remaining Longevity: 93 mo
Battery Voltage: 2.79 V
Brady Statistic AP VP Percent: 0 %
Brady Statistic AP VS Percent: 94 %
Brady Statistic AS VP Percent: 0 %
Brady Statistic AS VS Percent: 5 %
Date Time Interrogation Session: 20210226154404
Implantable Lead Implant Date: 20040827
Implantable Lead Implant Date: 20040827
Implantable Lead Location: 753859
Implantable Lead Location: 753860
Implantable Lead Model: 4092
Implantable Pulse Generator Implant Date: 20140519
Lead Channel Impedance Value: 469 Ohm
Lead Channel Impedance Value: 935 Ohm
Lead Channel Pacing Threshold Amplitude: 0.5 V
Lead Channel Pacing Threshold Amplitude: 1.375 V
Lead Channel Pacing Threshold Pulse Width: 0.4 ms
Lead Channel Pacing Threshold Pulse Width: 0.4 ms
Lead Channel Setting Pacing Amplitude: 2 V
Lead Channel Setting Pacing Amplitude: 2.5 V
Lead Channel Setting Pacing Pulse Width: 0.4 ms
Lead Channel Setting Sensing Sensitivity: 4 mV

## 2019-11-26 NOTE — Telephone Encounter (Signed)
Received OV note from 34.  Per review of note, Pt saw PA with concerns regarding palpitations.  Per review of same note-Pt was reduced to amiodarone 50 mg daily.  Pt with breakthrough palpitations on lower dose of amiodarone (not ordered by Dr. Rayann Heman.).  Will attempt to get additional information from PA regarding why Pt was reduced to amiodarone 50 mg daily.  Will then f/u with Dr. Rayann Heman.  Advised Pt would assist further on Wednesday

## 2019-11-27 ENCOUNTER — Emergency Department (HOSPITAL_COMMUNITY): Payer: Medicare Other

## 2019-11-27 ENCOUNTER — Encounter (HOSPITAL_COMMUNITY): Payer: Self-pay | Admitting: Emergency Medicine

## 2019-11-27 ENCOUNTER — Emergency Department (HOSPITAL_COMMUNITY)
Admission: EM | Admit: 2019-11-27 | Discharge: 2019-11-27 | Disposition: A | Discharge status: Home / Self Care | Payer: Medicare Other | Attending: Emergency Medicine | Admitting: Emergency Medicine

## 2019-11-27 ENCOUNTER — Other Ambulatory Visit: Payer: Self-pay

## 2019-11-27 DIAGNOSIS — Z950 Presence of cardiac pacemaker: Secondary | ICD-10-CM | POA: Insufficient documentation

## 2019-11-27 DIAGNOSIS — E039 Hypothyroidism, unspecified: Secondary | ICD-10-CM | POA: Insufficient documentation

## 2019-11-27 DIAGNOSIS — K5730 Diverticulosis of large intestine without perforation or abscess without bleeding: Secondary | ICD-10-CM | POA: Diagnosis not present

## 2019-11-27 DIAGNOSIS — I10 Essential (primary) hypertension: Secondary | ICD-10-CM | POA: Insufficient documentation

## 2019-11-27 DIAGNOSIS — I2510 Atherosclerotic heart disease of native coronary artery without angina pectoris: Secondary | ICD-10-CM | POA: Insufficient documentation

## 2019-11-27 DIAGNOSIS — K5900 Constipation, unspecified: Secondary | ICD-10-CM | POA: Diagnosis not present

## 2019-11-27 DIAGNOSIS — R1032 Left lower quadrant pain: Secondary | ICD-10-CM | POA: Diagnosis not present

## 2019-11-27 DIAGNOSIS — Z79899 Other long term (current) drug therapy: Secondary | ICD-10-CM | POA: Insufficient documentation

## 2019-11-27 DIAGNOSIS — R1033 Periumbilical pain: Secondary | ICD-10-CM | POA: Diagnosis not present

## 2019-11-27 LAB — CBC WITH DIFFERENTIAL/PLATELET
Abs Immature Granulocytes: 0.03 10*3/uL (ref 0.00–0.07)
Basophils Absolute: 0 10*3/uL (ref 0.0–0.1)
Basophils Relative: 0 %
Eosinophils Absolute: 0.1 10*3/uL (ref 0.0–0.5)
Eosinophils Relative: 2 %
HCT: 36 % (ref 36.0–46.0)
Hemoglobin: 11.2 g/dL — ABNORMAL LOW (ref 12.0–15.0)
Immature Granulocytes: 0 %
Lymphocytes Relative: 30 %
Lymphs Abs: 2.4 10*3/uL (ref 0.7–4.0)
MCH: 28.9 pg (ref 26.0–34.0)
MCHC: 31.1 g/dL (ref 30.0–36.0)
MCV: 92.8 fL (ref 80.0–100.0)
Monocytes Absolute: 0.8 10*3/uL (ref 0.1–1.0)
Monocytes Relative: 10 %
Neutro Abs: 4.5 10*3/uL (ref 1.7–7.7)
Neutrophils Relative %: 58 %
Platelets: 193 10*3/uL (ref 150–400)
RBC: 3.88 MIL/uL (ref 3.87–5.11)
RDW: 15.6 % — ABNORMAL HIGH (ref 11.5–15.5)
WBC: 7.8 10*3/uL (ref 4.0–10.5)
nRBC: 0 % (ref 0.0–0.2)

## 2019-11-27 LAB — HEPATIC FUNCTION PANEL
ALT: 22 U/L (ref 0–44)
AST: 39 U/L (ref 15–41)
Albumin: 3.7 g/dL (ref 3.5–5.0)
Alkaline Phosphatase: 107 U/L (ref 38–126)
Bilirubin, Direct: 0.1 mg/dL (ref 0.0–0.2)
Indirect Bilirubin: 0.3 mg/dL (ref 0.3–0.9)
Total Bilirubin: 0.4 mg/dL (ref 0.3–1.2)
Total Protein: 7.6 g/dL (ref 6.5–8.1)

## 2019-11-27 LAB — BASIC METABOLIC PANEL
Anion gap: 9 (ref 5–15)
BUN: 16 mg/dL (ref 8–23)
CO2: 25 mmol/L (ref 22–32)
Calcium: 9.9 mg/dL (ref 8.9–10.3)
Chloride: 101 mmol/L (ref 98–111)
Creatinine, Ser: 1.29 mg/dL — ABNORMAL HIGH (ref 0.44–1.00)
GFR calc Af Amer: 42 mL/min — ABNORMAL LOW (ref >60)
GFR calc non Af Amer: 36 mL/min — ABNORMAL LOW (ref >60)
Glucose, Bld: 112 mg/dL — ABNORMAL HIGH (ref 70–99)
Potassium: 3.9 mmol/L (ref 3.5–5.1)
Sodium: 135 mmol/L (ref 135–145)

## 2019-11-27 LAB — URINALYSIS, ROUTINE W REFLEX MICROSCOPIC
Bilirubin Urine: NEGATIVE
Glucose, UA: NEGATIVE mg/dL
Hgb urine dipstick: NEGATIVE
Ketones, ur: NEGATIVE mg/dL
Leukocytes,Ua: NEGATIVE
Nitrite: NEGATIVE
Protein, ur: NEGATIVE mg/dL
Specific Gravity, Urine: 1.011 (ref 1.005–1.030)
pH: 5 (ref 5.0–8.0)

## 2019-11-27 LAB — POC OCCULT BLOOD, ED: Fecal Occult Bld: NEGATIVE

## 2019-11-27 MED ORDER — BISACODYL 5 MG PO TBEC: 5.0000 mg | DELAYED_RELEASE_TABLET | Freq: Two times a day (BID) | ORAL | 0 refills | Status: DC | PRN

## 2019-11-27 MED ORDER — BISACODYL 5 MG PO TBEC
5.0000 mg | DELAYED_RELEASE_TABLET | Freq: Once | ORAL | Status: AC
  Administered 2019-11-27: 5 mg via ORAL
  Filled 2019-11-27: qty 1

## 2019-11-27 MED ORDER — IOHEXOL 300 MG/ML  SOLN
75.0000 mL | Freq: Once | INTRAMUSCULAR | Status: AC | PRN
  Administered 2019-11-27: 75 mL via INTRAVENOUS

## 2019-11-27 NOTE — ED Provider Notes (Signed)
  Face-to-face evaluation   History: She is here for evaluation of decreased stooling, and decreased oral intake for several days.  She feels like her abdomen is "hard."  Physical exam: Awake and alert and cooperative.  She does not appear uncomfortable.  Abdomen is soft, with question for mass in the right lower quadrant.  Heart regular rate and rhythm without murmur.  Lungs clear.  Medical screening examination/treatment/procedure(s) were conducted as a shared visit with non-physician practitioner(s) and myself.  I personally evaluated the patient during the encounter    Daleen Bo, MD 12/01/19 [phone]

## 2019-11-27 NOTE — ED Triage Notes (Signed)
Pt has not had a bowel movement in a long time.  States she has taken miralax and stool softeners with no relief

## 2019-11-27 NOTE — ED Provider Notes (Signed)
[facility] EMERGENCY DEPARTMENT Provider Note   CSN: [id number] Arrival date & time: 11/27/19  1820     History Chief Complaint  Patient presents with  . Constipation    Melanie [name] is a 86 y.o. female with history of CAD, hypertension, hypothyroidism, paroxysmal atrial fibrillation presenting for evaluation of constipation.  She reports having very hard stools, last passed a very small stool yesterday but without relief of her abdominal discomfort and distention.  She has hemorrhoids which causes rectal pain which has been a chronic concern.  She denies rectal pressure.  She is also had no nausea, fevers or chills.  She takes daily stool softeners and milk of magnesia which have not been helpful.  She has maintained a fair appetite, she denies dysuria, chest pain, shortness of breath or other complaints.  She does have mid back pain which has been chronic for months.  HPI     Past Medical History:  Diagnosis Date  . CAD (coronary artery disease)    Two vessel, quiescent; 70% LAD aretery disease and well collateralized, 100% RCA by cardiac cath in 2004; NL LVF; low risk Cardioloite; EF 74%, 11/10  . Dyslipidemia   . Hypercalcemia   . Hypertension   . Hypothyroidism   . Paroxysmal atrial fibrillation (HCC)    on coumadin and amiodarone  . Sinus node dysfunction (HCC)    s/p Medtronic dual-chamber pacemaker  . Vertigo     Patient Active Problem List   Diagnosis Date Noted  . Lower extremity edema 01/08/2019  . Hyperparathyroidism, primary ([place]) 12/16/2016  . Femur fracture, right ([place]) 06/11/2012  . Anemia 08/25/2011  . Long term current use of anticoagulant 12/12/2010  . Hypothyroidism 08/12/2009  . HYPERCALCEMIA 08/12/2009  . Sinoatrial node dysfunction ([place]) 08/12/2009  . DYSPNEA ON EXERTION 08/12/2009  . PACEMAKER-Medtronic 08/12/2009  . HYPERLIPIDEMIA-MIXED 07/11/2008  . HYPERTENSION, BENIGN 07/11/2008  . CAD, NATIVE VESSEL 07/11/2008  . ATRIAL FIBRILLATION,  PAROXYSMAL 07/11/2008    Past Surgical History:  Procedure Laterality Date  . ABDOMINAL HYSTERECTOMY    . FRACTURE SURGERY    . PACEMAKER GENERATOR CHANGE N/A 02/12/2013   Procedure: PACEMAKER GENERATOR CHANGE;  Surgeon: Deboraha Sprang, MD;  Location: [facility] CATH LAB;  Service: Cardiovascular;  Laterality: N/A;  . PACEMAKER INSERTION  05/24/03   MDT [facility]     OB History   No obstetric history on file.     Family History  Problem Relation Age of Onset  . Stroke Father   . Diabetes Sister   . Hypertension Sister   . Stroke Brother   . Coronary artery disease Neg Hx     Social History   Tobacco Use  . Smoking status: Never Smoker  . Smokeless tobacco: Never Used  Substance Use Topics  . Alcohol use: No  . Drug use: No    Home Medications Prior to Admission medications   Medication Sig Start Date End Date Taking? Authorizing Provider  acetaminophen (TYLENOL) 325 MG tablet Take 650 mg by mouth every 6 (six) hours as needed.    [provider]  amiodarone (PACERONE) 100 MG tablet Take 1 tablet (100 mg total) by mouth daily. 06/01/19   Allred, Jeneen Rinks, MD  amiodarone (PACERONE) 200 MG tablet Take 50 mg by mouth daily. 11/12/19   [provider]  amLODipine (NORVASC) 10 MG tablet Take 10 mg by mouth daily. 12/25/18   [provider]  bisacodyl (DULCOLAX) 5 MG EC tablet Take 1 tablet (5 mg total) by  mouth 2 (two) times daily as needed for moderate constipation. 11/27/19   Evalee Jefferson, PA-C  cetirizine (ZYRTEC) 10 MG tablet Take 10 mg by mouth at bedtime. 11/09/19   [provider]  clonazePAM (KLONOPIN) 0.5 MG tablet Take 0.5 mg by mouth at bedtime. For restless leg syndrome    [provider]  DOK 100 MG capsule Take 100 mg by mouth 2 (two) times daily. 11/13/19   [provider]  Elastic Bandages & Supports ([place]) [place] 1 each by Does not apply route as directed. Knee Hi Compression Stockings to be measured prior  to order-medium pressure Dx: Lower extremity edema & CHF 01/08/19   Daune Perch, NP  ezetimibe (ZETIA) 10 MG tablet Take 10 mg by mouth daily.    [provider]  furosemide (LASIX) 40 MG tablet Take 40 mg by mouth daily. 12/27/18   [provider]  HM CRANBERRY 500 MG TABS Take 1 tablet by mouth daily. 11/09/19   [provider]  isosorbide mononitrate (IMDUR) 30 MG 24 hr tablet Take 30 mg by mouth every morning.     [provider]  levothyroxine (SYNTHROID) 100 MCG tablet Take 100 mcg by mouth daily before breakfast.    [provider]  levothyroxine (SYNTHROID) 112 MCG tablet Take 112 mcg by mouth daily. 11/21/19   [provider]  metoprolol succinate (TOPROL-XL) 25 MG 24 hr tablet Take 25 mg by mouth daily. 12/25/18   [provider]  metoprolol tartrate (LOPRESSOR) 25 MG tablet Take 25 mg by mouth daily. 11/12/19   [provider]  pantoprazole (PROTONIX) 40 MG tablet Take 40 mg by mouth daily. 11/21/19   [provider]  polyethylene glycol powder (GLYCOLAX/MIRALAX) 17 GM/SCOOP powder SMARTSIG:1 Capful(s) By Mouth Daily 11/21/19   [provider]  Probiotic Product (PRO-BIOTIC BLEND PO) Take by mouth as needed.     [provider]  warfarin (COUMADIN) 2 MG tablet Take 2 mg by mouth. Take 2mg  every day except Monday; skip Mondays - or take as directed by MD    [provider]    Allergies    Ciprofloxacin, Hctz [hydrochlorothiazide], and Lopid [gemfibrozil]  Review of Systems   Review of Systems  Constitutional: Negative for chills and fever.  HENT: Negative for congestion and sore throat.   Eyes: Negative.   Respiratory: Negative for chest tightness and shortness of breath.   Cardiovascular: Negative for chest pain.  Gastrointestinal: Positive for abdominal distention, abdominal pain and constipation. Negative for nausea and vomiting.  Genitourinary: Negative.  Negative for  dysuria.  Musculoskeletal: Positive for back pain. Negative for arthralgias, joint swelling and neck pain.  Skin: Negative.  Negative for rash and wound.  Neurological: Negative for dizziness, weakness, light-headedness, numbness and headaches.  Psychiatric/Behavioral: Negative.     Physical Exam Updated Vital Signs BP (!) 138/58   Pulse (!) 59   Temp 98.7 F (37.1 C) (Oral)   Resp 16   Ht 5\' 3"  (1.6 m)   Wt 69.4 kg   SpO2 96%   BMI 27.10 kg/m   Physical Exam Vitals and nursing note reviewed.  Constitutional:      Appearance: She is well-developed.  HENT:     Head: Normocephalic and atraumatic.  Eyes:     Conjunctiva/sclera: Conjunctivae normal.  Cardiovascular:     Rate and Rhythm: Normal rate and regular rhythm.     Heart sounds: Normal heart sounds.  Pulmonary:     Effort: Pulmonary  effort is normal.     Breath sounds: Normal breath sounds. No wheezing.  Abdominal:     General: Bowel sounds are normal. There is distension.     Palpations: Abdomen is soft.     Tenderness: There is abdominal tenderness in the periumbilical area and left lower quadrant. There is no right CVA tenderness, left CVA tenderness or guarding.  Musculoskeletal:        General: Normal range of motion.     Cervical back: Normal range of motion.  Skin:    General: Skin is warm and dry.  Neurological:     Mental Status: She is alert.     ED Results / Procedures / Treatments   Labs (all labs ordered are listed, but only abnormal results are displayed) Labs Reviewed  BASIC METABOLIC PANEL - Abnormal; Notable for the following components:      Result Value   Glucose, Bld 112 (*)    Creatinine, Ser 1.29 (*)    GFR calc non Af Amer 36 (*)    GFR calc Af Amer 42 (*)    All other components within normal limits  CBC WITH DIFFERENTIAL/PLATELET - Abnormal; Notable for the following components:   Hemoglobin 11.2 (*)    RDW 15.6 (*)    All other components within normal limits  HEPATIC  FUNCTION PANEL  URINALYSIS, ROUTINE W REFLEX MICROSCOPIC  POC OCCULT BLOOD, ED    EKG None  Radiology DG Abdomen 1 View  Result Date: 11/27/2019 CLINICAL DATA:  86 year old female with constipation. EXAM: ABDOMEN - 1 VIEW COMPARISON:  Abdominal ultrasound dated 01/26/2017 and CT abdomen pelvis dated 08/10/2014. FINDINGS: Moderate colonic stool burden. There is no bowel dilatation or evidence of obstruction. No free air or radiopaque calculi. Atherosclerotic calcification of the aorta. Degenerative changes of the spine. Partially visualized right femoral internal fixation. No acute osseous pathology. IMPRESSION: Moderate colonic stool burden.  No bowel obstruction. Electronically Signed   By: Anner Crete M.D.   On: 11/27/2019 19:45   CT ABDOMEN PELVIS W CONTRAST  Result Date: 11/27/2019 CLINICAL DATA:  Abdomen distension EXAM: CT ABDOMEN AND PELVIS WITH CONTRAST TECHNIQUE: Multidetector CT imaging of the abdomen and pelvis was performed using the standard protocol following bolus administration of intravenous contrast. CONTRAST:  31mL OMNIPAQUE IOHEXOL 300 MG/ML  SOLN COMPARISON:  KUB 11/27/2019, CT 08/10/2014 FINDINGS: Lower chest: Lung bases demonstrate no acute consolidation or effusion. Subpleural fibrosis, progressed since 2015. Cardiomegaly with partially visualized intracardiac pacing leads. Mitral calcifications. Small hiatal hernia. Hepatobiliary: Liver contour appears slightly nodular and lobulated. No calcified gallstone. No biliary dilatation. Stable right hepatic cysts. Pancreas: Unremarkable. No pancreatic ductal dilatation or surrounding inflammatory changes. Spleen: Hypodense splenic lesions, some of which were present on comparison study. 1 cm new hypodense posterior spleen lesion, series 2, image number 21. Adrenals/Urinary Tract: Adrenal glands are normal. Kidneys show no hydronephrosis. Bilateral renal cysts. Funneled appearance of the anterior bladder with wall thickening.  Stomach/Bowel: Stomach nonenlarged. No dilated small bowel. Large volume of stool in the colon. Left colon diverticula without acute inflammatory process. Appendix not well seen but no right lower quadrant inflammatory process. Vascular/Lymphatic: Extensive aortic atherosclerosis without aneurysm. No significant adenopathy Reproductive: Status post hysterectomy. No adnexal masses. Other: Negative for free air or free fluid. Musculoskeletal: Possible acute to subacute superior endplate fracture at 624THL with lucency at the posterior vertebral body. IMPRESSION: 1. Nonobstructed bowel gas pattern. Large volume of stool in the colon suggesting constipation. Left colon diverticular disease without acute  inflammatory process. 2. Lobulated liver contour suggesting possible cirrhosis. 3. New 1 cm hypodense lesion within the posterior spleen. CT follow-up in 6 months may be considered to document stability, given the presence of other similar appearing lesions in the spleen, several of which are stable compared to the prior CT. 4. Focal anterior bladder wall thickening, consider focal cystitis versus neoplasm. Direct visualization as clinically indicated. 5. Possible acute or subacute superior endplate fracture at 624THL with suspected lucency in the posterior vertebral body. Electronically Signed   By: Donavan Foil M.D.   On: 11/27/2019 21:12    Procedures Procedures (including critical care time)  Medications Ordered in ED Medications  bisacodyl (DULCOLAX) EC tablet 5 mg (has no administration in time range)  iohexol (OMNIPAQUE) 300 MG/ML solution 75 mL (75 mLs Intravenous Contrast Given 11/27/19 2042)    ED Course  I have reviewed the triage vital signs and the nursing notes.  Pertinent labs & imaging results that were available during my care of the patient were reviewed by me and considered in my medical decision making (see chart for details).    MDM Rules/Calculators/A&P                      Labs and  imaging reviewed with multiple findings per CT imaging.  First, patient has significant constipation without obstruction or mass.  There is a suggestion of acute versus subacute T11 compression fracture which is compatible with her complaint of chronic back pain.  There is an area of bladder wall thickening, possibly cystitis versus neoplasm.  She has no history of dysuria and her UA here is negative for acute infection.  Splenic nodule as an incidental finding.  Patient will be placed on MiraLAX for hopeful improvement in her symptoms of constipation.  Other CT findings were discussed and recommended follow-up with her PCP for follow-up care. Final Clinical Impression(s) / ED Diagnoses Final diagnoses:  Constipation, unspecified constipation type    Rx / DC Orders ED Discharge Orders         Ordered    bisacodyl (DULCOLAX) 5 MG EC tablet  2 times daily PRN     11/27/19 2233           Evalee Jefferson, PA-C 11/27/19 2238    Daleen Bo, MD 12/01/19 [phone]

## 2019-11-27 NOTE — ED Notes (Signed)
Pt from [facility], sent here for constipation.  Pt states she is on stool softeners and miralax.  Pt states she was able to pass very small amount of hard stool yesterday.

## 2019-11-27 NOTE — ED Notes (Signed)
Nurse at bedside during rectal exam.

## 2019-11-27 NOTE — Discharge Instructions (Signed)
You do have some moderate constipation, but no other worrisome findings as the source of your constipation such as intestinal blockages.  You may continue using your home MiraLAX to help keep your stools soft.  I have added a laxative to help stimulate a bowel movement.  Use this medication as needed.  You have been given a dose this evening.  Make sure you are drinking plenty of fluids which can also help prevent constipation.    Your CT scan does show a couple of other issues that may require further evaluation.  1.  There is a suggestion of an old compression fracture of your 11th thoracic vertebrae.  This may be the source of your chronic back pain.  #2 there is a nodule in your spleen which appears benign, it is recommended however, a repeat CT scan in about 6 months to make sure it is unchanged.  #3 there is an area of bladder wall thickening which may be from irritation or inflammation, however a bladder neoplasm or tumor cannot be completely excluded based on the CT scan.  Please call your primary MD to discuss these findings who can arrange follow-up care per his recommendations.

## 2019-11-28 NOTE — Telephone Encounter (Signed)
Outreach made to PCP to request addl information regarding why Pt's amiodarone was decreased.  PA not available.  Left message to return this nurse call with addl information.   Discussed with Dr. Rayann Heman.   Per Dr. Rayann Heman schedule Pt in person visit at [facility] office this Friday.  Attempted to call Pt.  No answer/no VM  Call placed to Pt's sister per DPR.  Pt's sister will advise Pt she has f/u with Dr. Rayann Heman at [facility] office 11/30/19 at 9:30 am.  No further action needed.

## 2019-11-30 ENCOUNTER — Encounter: Payer: Self-pay | Admitting: Internal Medicine

## 2019-11-30 ENCOUNTER — Other Ambulatory Visit: Payer: Self-pay

## 2019-11-30 ENCOUNTER — Ambulatory Visit (INDEPENDENT_AMBULATORY_CARE_PROVIDER_SITE_OTHER): Payer: Medicare Other | Admitting: Internal Medicine

## 2019-11-30 VITALS — BP 124/70 | HR 64 | Ht 63.0 in | Wt 157.8 lb

## 2019-11-30 DIAGNOSIS — I480 Paroxysmal atrial fibrillation: Secondary | ICD-10-CM | POA: Diagnosis not present

## 2019-11-30 DIAGNOSIS — I10 Essential (primary) hypertension: Secondary | ICD-10-CM

## 2019-11-30 DIAGNOSIS — K219 Gastro-esophageal reflux disease without esophagitis: Secondary | ICD-10-CM | POA: Diagnosis not present

## 2019-11-30 DIAGNOSIS — I482 Chronic atrial fibrillation, unspecified: Secondary | ICD-10-CM | POA: Diagnosis not present

## 2019-11-30 DIAGNOSIS — I495 Sick sinus syndrome: Secondary | ICD-10-CM | POA: Diagnosis not present

## 2019-11-30 DIAGNOSIS — K5900 Constipation, unspecified: Secondary | ICD-10-CM | POA: Diagnosis not present

## 2019-11-30 DIAGNOSIS — Z950 Presence of cardiac pacemaker: Secondary | ICD-10-CM | POA: Diagnosis not present

## 2019-11-30 DIAGNOSIS — S22000A Wedge compression fracture of unspecified thoracic vertebra, initial encounter for closed fracture: Secondary | ICD-10-CM | POA: Diagnosis not present

## 2019-11-30 LAB — CUP PACEART INCLINIC DEVICE CHECK
Battery Impedance: 428 Ohm
Battery Remaining Longevity: 96 mo
Battery Voltage: 2.79 V
Brady Statistic AP VP Percent: 0.2 %
Brady Statistic AP VS Percent: 94.4 %
Brady Statistic AS VP Percent: 0.1 % — CL
Brady Statistic AS VS Percent: 5.4 %
Date Time Interrogation Session: 20210305122705
Implantable Lead Implant Date: 20040827
Implantable Lead Implant Date: 20040827
Implantable Lead Location: 753859
Implantable Lead Location: 753860
Implantable Lead Model: 4092
Implantable Pulse Generator Implant Date: 20140519
Lead Channel Impedance Value: 489 Ohm
Lead Channel Impedance Value: 910 Ohm
Lead Channel Pacing Threshold Amplitude: 0.5 V
Lead Channel Pacing Threshold Amplitude: 1 V
Lead Channel Pacing Threshold Pulse Width: 0.4 ms
Lead Channel Pacing Threshold Pulse Width: 0.4 ms
Lead Channel Sensing Intrinsic Amplitude: 1.4 mV
Lead Channel Sensing Intrinsic Amplitude: 8 mV
Lead Channel Setting Pacing Amplitude: 2 V
Lead Channel Setting Pacing Amplitude: 2.5 V
Lead Channel Setting Pacing Pulse Width: 0.4 ms
Lead Channel Setting Sensing Sensitivity: 4 mV

## 2019-11-30 MED ORDER — AMIODARONE HCL 100 MG PO TABS: 100.0000 mg | ORAL_TABLET | Freq: Every day | ORAL | Status: DC

## 2019-11-30 NOTE — Progress Notes (Signed)
PCP: [facility]   Primary EP:  Dr Rayann Heman  Melanie [name] is a 86 y.o. female who presents today for routine electrophysiology followup.  Since last being seen in our clinic, the patient reports doing very well for her age.  She has occasional heart "awareness" which she describes as a prominence of her heart beat, more noticeable when laying on her left side.  She denies frequent tachypalpitations or irregularity.  Her amiodarone has been reduced by primary care to 50mg  QOD.  She is referred by primary care back to me today for further investigation.  Today, she denies symptoms of chest pain, shortness of breath,  lower extremity edema, dizziness, presyncope, or syncope.  Her primary concern is with constipation.  The patient is otherwise without complaint today.   Past Medical History:  Diagnosis Date  . CAD (coronary artery disease)    Two vessel, quiescent; 70% LAD aretery disease and well collateralized, 100% RCA by cardiac cath in 2004; NL LVF; low risk Cardioloite; EF 74%, 11/10  . Dyslipidemia   . Hypercalcemia   . Hypertension   . Hypothyroidism   . Paroxysmal atrial fibrillation (HCC)    on coumadin and amiodarone  . Sinus node dysfunction (HCC)    s/p Medtronic dual-chamber pacemaker  . Vertigo    Past Surgical History:  Procedure Laterality Date  . ABDOMINAL HYSTERECTOMY    . FRACTURE SURGERY    . PACEMAKER GENERATOR CHANGE N/A 02/12/2013   Procedure: PACEMAKER GENERATOR CHANGE;  Surgeon: Deboraha Sprang, MD;  Location: [facility] CATH LAB;  Service: Cardiovascular;  Laterality: N/A;  . PACEMAKER INSERTION  05/24/03   MDT Kappa    ROS- all systems are reviewed and negative except as per HPI above  Current Outpatient Medications  Medication Sig Dispense Refill  . acetaminophen (TYLENOL) 325 MG tablet Take 650 mg by mouth every 6 (six) hours as needed.    Marland Kitchen amiodarone (PACERONE) 100 MG tablet Take 25 mg by mouth daily.    Marland Kitchen amLODipine (NORVASC) 10 MG tablet Take  10 mg by mouth daily.    . bisacodyl (DULCOLAX) 5 MG EC tablet Take 1 tablet (5 mg total) by mouth 2 (two) times daily as needed for moderate constipation. 14 tablet 0  . cetirizine (ZYRTEC) 10 MG tablet Take 10 mg by mouth at bedtime.    . clonazePAM (KLONOPIN) 0.5 MG tablet Take 0.5 mg by mouth at bedtime. For restless leg syndrome    . DOK 100 MG capsule Take 100 mg by mouth 2 (two) times daily.    Regino Schultze Bandages & Supports (MEDICAL COMPRESSION STOCKINGS) MISC 1 each by Does not apply route as directed. Knee Hi Compression Stockings to be measured prior to order-medium pressure Dx: Lower extremity edema & CHF 1 each 0  . ezetimibe (ZETIA) 10 MG tablet Take 10 mg by mouth daily.    . furosemide (LASIX) 40 MG tablet Take 40 mg by mouth daily.    Marland Kitchen HM CRANBERRY 500 MG TABS Take 1 tablet by mouth daily.    . isosorbide mononitrate (IMDUR) 30 MG 24 hr tablet Take 30 mg by mouth every morning.     Marland Kitchen levothyroxine (SYNTHROID) 100 MCG tablet Take 100 mcg by mouth daily before breakfast.    . levothyroxine (SYNTHROID) 112 MCG tablet Take 112 mcg by mouth daily.    . metoprolol tartrate (LOPRESSOR) 25 MG tablet Take 25 mg by mouth daily.    . metoprolol tartrate (LOPRESSOR) 25 MG  tablet Take 25 mg by mouth daily.    . pantoprazole (PROTONIX) 40 MG tablet Take 40 mg by mouth daily.    . polyethylene glycol powder (GLYCOLAX/MIRALAX) 17 GM/SCOOP powder SMARTSIG:1 Capful(s) By Mouth Daily    . Probiotic Product (PRO-BIOTIC BLEND PO) Take by mouth as needed.     . warfarin (COUMADIN) 2 MG tablet Take 2 mg by mouth. Take 2mg  every day except Monday; skip Mondays - or take as directed by MD     No current facility-administered medications for this visit.    Physical Exam: Vitals:   11/30/19 0924  BP: 124/70  Pulse: 64  SpO2: 97%  Weight: 157 lb 12.8 oz (71.6 kg)  Height: 5\' 3"  (1.6 m)    GEN- The patient is elderly appearing, alert and oriented x 3 today.   Head- normocephalic,  atraumatic Eyes-  Sclera clear, conjunctiva pink Ears- hearing intact Oropharynx- clear Lungs-   normal work of breathing Chest- pacemaker pocket is well healed Heart- Regular rate and rhythm  Extremities- no clubbing, cyanosis, or edema  Pacemaker interrogation- reviewed in detail today,  See PACEART report   Assessment and Plan:  1. Symptomatic sinus bradycardia  Normal pacemaker function See Pace Art report No changes today she is not device dependant today  2. Paroxysmal atrial fibrillation afib burden by ppm is 0.4% (unchanged from prior) Labs are followed by [facility].  The importance of close monitoring of her LFTs, TFTs and PFTs to avoid toxicity was considered today. I have returned her amiodarone to 100mg  daily.   chads2vasc score is 5.  She is on coumadin  3. HTN Stable No change required today  4. Heart awareness She has symptoms of heart awareness, possibly due to anxiety or elevated BP.  This does not correlate with an arrhythmia burden and is not due to afib Continue AF management as above She should watch her BP during episodes.  We also discussed that some awareness of heart beat may be normal.  No further workup is planned  Return to see me in September as scheduled Continue remote monitoring in the interim.  Thompson Grayer MD, [facility] 11/30/2019 11:01 AM

## 2019-11-30 NOTE — Patient Instructions (Addendum)
Medication Instructions:   Amiodarone needs to be 100mg  daily.   Continue all other medications.    Labwork: none  Testing/Procedures: none  Follow-Up: Already scheduled for September.    Any Other Special Instructions Will Be Listed Below (If Applicable).  If you need a refill on your cardiac medications before your next appointment, please call your pharmacy.

## 2019-12-13 DIAGNOSIS — I10 Essential (primary) hypertension: Secondary | ICD-10-CM | POA: Diagnosis not present

## 2019-12-13 DIAGNOSIS — S22080A Wedge compression fracture of T11-T12 vertebra, initial encounter for closed fracture: Secondary | ICD-10-CM | POA: Diagnosis not present

## 2019-12-17 DIAGNOSIS — I482 Chronic atrial fibrillation, unspecified: Secondary | ICD-10-CM | POA: Diagnosis not present

## 2019-12-20 DIAGNOSIS — R300 Dysuria: Secondary | ICD-10-CM | POA: Diagnosis not present

## 2019-12-21 ENCOUNTER — Encounter: Payer: Self-pay | Admitting: Cardiovascular Disease

## 2019-12-21 ENCOUNTER — Telehealth (INDEPENDENT_AMBULATORY_CARE_PROVIDER_SITE_OTHER): Payer: Medicare Other | Admitting: Cardiovascular Disease

## 2019-12-21 VITALS — Ht 63.0 in | Wt 156.0 lb

## 2019-12-21 DIAGNOSIS — Z7189 Other specified counseling: Secondary | ICD-10-CM

## 2019-12-21 DIAGNOSIS — I10 Essential (primary) hypertension: Secondary | ICD-10-CM

## 2019-12-21 DIAGNOSIS — I480 Paroxysmal atrial fibrillation: Secondary | ICD-10-CM

## 2019-12-21 DIAGNOSIS — I2510 Atherosclerotic heart disease of native coronary artery without angina pectoris: Secondary | ICD-10-CM | POA: Diagnosis not present

## 2019-12-21 DIAGNOSIS — R600 Localized edema: Secondary | ICD-10-CM | POA: Diagnosis not present

## 2019-12-21 DIAGNOSIS — I25118 Atherosclerotic heart disease of native coronary artery with other forms of angina pectoris: Secondary | ICD-10-CM

## 2019-12-21 DIAGNOSIS — E785 Hyperlipidemia, unspecified: Secondary | ICD-10-CM | POA: Diagnosis not present

## 2019-12-21 DIAGNOSIS — Z950 Presence of cardiac pacemaker: Secondary | ICD-10-CM

## 2019-12-21 DIAGNOSIS — Z7901 Long term (current) use of anticoagulants: Secondary | ICD-10-CM

## 2019-12-21 NOTE — Progress Notes (Signed)
Virtual Visit via Telephone Note   This visit type was conducted due to national recommendations for restrictions regarding the COVID-19 Pandemic (e.g. social distancing) in an effort to limit this patient's exposure and mitigate transmission in our community.  Due to her co-morbid illnesses, this patient is at least at moderate risk for complications without adequate follow up.  This format is felt to be most appropriate for this patient at this time.  The patient did not have access to video technology/had technical difficulties with video requiring transitioning to audio format only (telephone).  All issues noted in this document were discussed and addressed.  No physical exam could be performed with this format.  Please refer to the patient's chart for her  consent to telehealth for [facility]   The patient was identified using 2 identifiers.  Date:  12/21/2019   ID:  Melanie [name], DOB 1926/05/07, MRN [id number]  Patient Location: Home Provider Location: Home  PCP:  [facility]  Cardiologist:  Kate Sable, MD  Electrophysiologist:  Thompson Grayer, MD   Evaluation Performed:  Follow-Up Visit  Chief Complaint:  CAD  History of Present Illness:    Melanie [name] is a 86 y.o. female with coronary artery disease, paroxysmal atrial fibrillation, and has a pacemaker for symptomatic bradycardia.  She was evaluated by Dr. Rayann Heman most recently on 11/30/2019 who increased amiodarone 200 mg daily.  Her niece is a Charity fundraiser at [employer].   She denies chest pain. She occasionally has a sensation of her heart racing at night when she lies down. She says "it's not bad at all".   She's no longer using compression stockings.   Past Medical History:  Diagnosis Date  . CAD (coronary artery disease)    Two vessel, quiescent; 70% LAD aretery disease and well collateralized, 100% RCA by cardiac cath in 2004; NL LVF; low risk Cardioloite; EF 74%, 11/10  . Dyslipidemia   .  Hypercalcemia   . Hypertension   . Hypothyroidism   . Paroxysmal atrial fibrillation (HCC)    on coumadin and amiodarone  . Sinus node dysfunction (HCC)    s/p Medtronic dual-chamber pacemaker  . Vertigo    Past Surgical History:  Procedure Laterality Date  . ABDOMINAL HYSTERECTOMY    . FRACTURE SURGERY    . PACEMAKER GENERATOR CHANGE N/A 02/12/2013   Procedure: PACEMAKER GENERATOR CHANGE;  Surgeon: Deboraha Sprang, MD;  Location: [facility] CATH LAB;  Service: Cardiovascular;  Laterality: N/A;  . PACEMAKER INSERTION  05/24/03   MDT Kappa     Current Meds  Medication Sig  . acetaminophen (TYLENOL) 325 MG tablet Take 650 mg by mouth every 6 (six) hours as needed.  Marland Kitchen amiodarone (PACERONE) 100 MG tablet Take 1 tablet (100 mg total) by mouth daily.  Marland Kitchen amLODipine (NORVASC) 10 MG tablet Take 10 mg by mouth daily.  . bisacodyl (DULCOLAX) 5 MG EC tablet Take 1 tablet (5 mg total) by mouth 2 (two) times daily as needed for moderate constipation.  . cetirizine (ZYRTEC) 10 MG tablet Take 10 mg by mouth at bedtime.  . clonazePAM (KLONOPIN) 0.5 MG tablet Take 0.5 mg by mouth at bedtime. For restless leg syndrome  . DOK 100 MG capsule Take 100 mg by mouth 2 (two) times daily.  Regino Schultze Bandages & Supports (MEDICAL COMPRESSION STOCKINGS) MISC 1 each by Does not apply route as directed. Knee Hi Compression Stockings to be measured prior to order-medium pressure Dx: Lower extremity edema & CHF  . ezetimibe (  ZETIA) 10 MG tablet Take 10 mg by mouth daily.  . furosemide (LASIX) 40 MG tablet Take 40 mg by mouth daily.  Marland Kitchen HM CRANBERRY 500 MG TABS Take 1 tablet by mouth daily.  . isosorbide mononitrate (IMDUR) 30 MG 24 hr tablet Take 30 mg by mouth every morning.   Marland Kitchen levothyroxine (SYNTHROID) 100 MCG tablet Take 100 mcg by mouth daily before breakfast.  . levothyroxine (SYNTHROID) 112 MCG tablet Take 112 mcg by mouth daily.  . metoprolol tartrate (LOPRESSOR) 25 MG tablet Take 25 mg by mouth daily.  .  pantoprazole (PROTONIX) 40 MG tablet Take 40 mg by mouth daily.  . polyethylene glycol powder (GLYCOLAX/MIRALAX) 17 GM/SCOOP powder SMARTSIG:1 Capful(s) By Mouth Daily  . Probiotic Product (PRO-BIOTIC BLEND PO) Take by mouth as needed.   . warfarin (COUMADIN) 2 MG tablet Take 2 mg by mouth. Take 2mg  every day except Monday; skip Mondays - or take as directed by MD     Allergies:   Ciprofloxacin, Hctz [hydrochlorothiazide], and Lopid [gemfibrozil]   Social History   Tobacco Use  . Smoking status: Never Smoker  . Smokeless tobacco: Never Used  Substance Use Topics  . Alcohol use: No  . Drug use: No     Family Hx: The patient's family history includes Diabetes in her sister; Hypertension in her sister; Stroke in her brother and father. There is no history of Coronary artery disease.  ROS:   Please see the history of present illness.     All other systems reviewed and are negative.   Prior CV studies:   The following studies were reviewed today:  NA  Labs/Other Tests and Data Reviewed:    EKG:  No ECG reviewed.  Recent Labs: 11/27/2019: ALT 22; BUN 16; Creatinine, Ser 1.29; Hemoglobin 11.2; Platelets 193; Potassium 3.9; Sodium 135   Recent Lipid Panel No results found for: CHOL, TRIG, HDL, CHOLHDL, LDLCALC, LDLDIRECT  Wt Readings from Last 3 Encounters:  12/21/19 156 lb (70.8 kg)  11/30/19 157 lb 12.8 oz (71.6 kg)  11/27/19 153 lb (69.4 kg)     Objective:    Vital Signs:  Ht 5\' 3"  (1.6 m)   Wt 156 lb (70.8 kg)   BMI 27.63 kg/m    VITAL SIGNS:  reviewed  ASSESSMENT & PLAN:    1. [id number] stable on current medical therapy which includes amlodipine, metoprolol,and long-acting nitrates. Not on statin and takes Zetia. No ASA as she is on warfarin.  2. Essential HTN:No changes.  3. Hyperlipidemia:Not on statin and takes Zetia.   4. Paroxysmal atrial fibrillation:Maintained on amiodarone, metoprolol,and warfarin. TFT's, PFT's, and LFT's followed  by PCP. Follows with Dr. Rayann Heman. Denies bleeding problems.  5. Pacemaker:Followed by Dr. Rayann Heman. Normal device function.  6.  Lower extremity edema: She's no longer using compression stockings. No leg swelling at present.  She takes Lasix as well.    COVID-19 Education: The signs and symptoms of COVID-19 were discussed with the patient and how to seek care for testing (follow up with PCP or arrange E-visit).  The importance of social distancing was discussed today.  Time:   Today, I have spent 20 minutes with the patient with telehealth technology discussing the above problems.     Medication Adjustments/Labs and Tests Ordered: Current medicines are reviewed at length with the patient today.  Concerns regarding medicines are outlined above.   Tests Ordered: No orders of the defined types were placed in this encounter.   Medication Changes: No orders  of the defined types were placed in this encounter.   Follow Up:  Virtual Visit  in 6 month(s)  Signed, Kate Sable, MD  12/21/2019 11:43 AM    [place]

## 2019-12-21 NOTE — Patient Instructions (Signed)
Medication Instructions:  Continue all current medications.   Labwork: none  Testing/Procedures: none  Follow-Up: 6 months   Any Other Special Instructions Will Be Listed Below (If Applicable).   If you need a refill on your cardiac medications before your next appointment, please call your pharmacy.  

## 2019-12-26 DIAGNOSIS — E7849 Other hyperlipidemia: Secondary | ICD-10-CM | POA: Diagnosis not present

## 2019-12-26 DIAGNOSIS — I10 Essential (primary) hypertension: Secondary | ICD-10-CM | POA: Diagnosis not present

## 2020-01-08 DIAGNOSIS — E039 Hypothyroidism, unspecified: Secondary | ICD-10-CM | POA: Diagnosis not present

## 2020-01-24 DIAGNOSIS — N3020 Other chronic cystitis without hematuria: Secondary | ICD-10-CM | POA: Diagnosis not present

## 2020-01-25 DIAGNOSIS — R300 Dysuria: Secondary | ICD-10-CM | POA: Diagnosis not present

## 2020-01-25 DIAGNOSIS — Z950 Presence of cardiac pacemaker: Secondary | ICD-10-CM | POA: Diagnosis not present

## 2020-01-25 DIAGNOSIS — I482 Chronic atrial fibrillation, unspecified: Secondary | ICD-10-CM | POA: Diagnosis not present

## 2020-02-01 DIAGNOSIS — R110 Nausea: Secondary | ICD-10-CM | POA: Diagnosis not present

## 2020-02-01 DIAGNOSIS — N390 Urinary tract infection, site not specified: Secondary | ICD-10-CM | POA: Diagnosis not present

## 2020-02-08 DIAGNOSIS — Z950 Presence of cardiac pacemaker: Secondary | ICD-10-CM | POA: Diagnosis not present

## 2020-02-08 DIAGNOSIS — N390 Urinary tract infection, site not specified: Secondary | ICD-10-CM | POA: Diagnosis not present

## 2020-02-08 DIAGNOSIS — K219 Gastro-esophageal reflux disease without esophagitis: Secondary | ICD-10-CM | POA: Diagnosis not present

## 2020-02-08 DIAGNOSIS — S22000A Wedge compression fracture of unspecified thoracic vertebra, initial encounter for closed fracture: Secondary | ICD-10-CM | POA: Diagnosis not present

## 2020-02-08 DIAGNOSIS — R110 Nausea: Secondary | ICD-10-CM | POA: Diagnosis not present

## 2020-02-08 DIAGNOSIS — K5900 Constipation, unspecified: Secondary | ICD-10-CM | POA: Diagnosis not present

## 2020-02-08 DIAGNOSIS — I482 Chronic atrial fibrillation, unspecified: Secondary | ICD-10-CM | POA: Diagnosis not present

## 2020-02-29 DIAGNOSIS — I482 Chronic atrial fibrillation, unspecified: Secondary | ICD-10-CM | POA: Diagnosis not present

## 2020-03-17 ENCOUNTER — Emergency Department (HOSPITAL_COMMUNITY)
Admission: EM | Admit: 2020-03-17 | Discharge: 2020-03-17 | Disposition: A | Discharge status: Home / Self Care | Payer: Medicare Other | Attending: Emergency Medicine | Admitting: Emergency Medicine

## 2020-03-17 ENCOUNTER — Encounter (HOSPITAL_COMMUNITY): Payer: Self-pay | Admitting: *Deleted

## 2020-03-17 DIAGNOSIS — R5381 Other malaise: Secondary | ICD-10-CM | POA: Diagnosis not present

## 2020-03-17 DIAGNOSIS — Z743 Need for continuous supervision: Secondary | ICD-10-CM | POA: Diagnosis not present

## 2020-03-17 DIAGNOSIS — R339 Retention of urine, unspecified: Secondary | ICD-10-CM | POA: Diagnosis not present

## 2020-03-17 DIAGNOSIS — Z79899 Other long term (current) drug therapy: Secondary | ICD-10-CM | POA: Diagnosis not present

## 2020-03-17 DIAGNOSIS — R609 Edema, unspecified: Secondary | ICD-10-CM | POA: Diagnosis not present

## 2020-03-17 DIAGNOSIS — R39198 Other difficulties with micturition: Secondary | ICD-10-CM | POA: Diagnosis not present

## 2020-03-17 DIAGNOSIS — Z7901 Long term (current) use of anticoagulants: Secondary | ICD-10-CM | POA: Insufficient documentation

## 2020-03-17 DIAGNOSIS — R0902 Hypoxemia: Secondary | ICD-10-CM | POA: Diagnosis not present

## 2020-03-17 LAB — CBC WITH DIFFERENTIAL/PLATELET
Abs Immature Granulocytes: 0.05 10*3/uL (ref 0.00–0.07)
Basophils Absolute: 0 10*3/uL (ref 0.0–0.1)
Basophils Relative: 1 %
Eosinophils Absolute: 0.2 10*3/uL (ref 0.0–0.5)
Eosinophils Relative: 2 %
HCT: 36.8 % (ref 36.0–46.0)
Hemoglobin: 11.4 g/dL — ABNORMAL LOW (ref 12.0–15.0)
Immature Granulocytes: 1 %
Lymphocytes Relative: 41 %
Lymphs Abs: 3.2 10*3/uL (ref 0.7–4.0)
MCH: 28.2 pg (ref 26.0–34.0)
MCHC: 31 g/dL (ref 30.0–36.0)
MCV: 91.1 fL (ref 80.0–100.0)
Monocytes Absolute: 0.8 10*3/uL (ref 0.1–1.0)
Monocytes Relative: 11 %
Neutro Abs: 3.5 10*3/uL (ref 1.7–7.7)
Neutrophils Relative %: 44 %
Platelets: 166 10*3/uL (ref 150–400)
RBC: 4.04 MIL/uL (ref 3.87–5.11)
RDW: 14.7 % (ref 11.5–15.5)
WBC: 7.8 10*3/uL (ref 4.0–10.5)
nRBC: 0 % (ref 0.0–0.2)

## 2020-03-17 LAB — COMPREHENSIVE METABOLIC PANEL
ALT: 21 U/L (ref 0–44)
AST: 26 U/L (ref 15–41)
Albumin: 3.7 g/dL (ref 3.5–5.0)
Alkaline Phosphatase: 104 U/L (ref 38–126)
Anion gap: 9 (ref 5–15)
BUN: 24 mg/dL — ABNORMAL HIGH (ref 8–23)
CO2: 26 mmol/L (ref 22–32)
Calcium: 10.2 mg/dL (ref 8.9–10.3)
Chloride: 102 mmol/L (ref 98–111)
Creatinine, Ser: 1.34 mg/dL — ABNORMAL HIGH (ref 0.44–1.00)
GFR calc Af Amer: 40 mL/min — ABNORMAL LOW (ref >60)
GFR calc non Af Amer: 35 mL/min — ABNORMAL LOW (ref >60)
Glucose, Bld: 114 mg/dL — ABNORMAL HIGH (ref 70–99)
Potassium: 4.4 mmol/L (ref 3.5–5.1)
Sodium: 137 mmol/L (ref 135–145)
Total Bilirubin: 0.4 mg/dL (ref 0.3–1.2)
Total Protein: 7.8 g/dL (ref 6.5–8.1)

## 2020-03-17 LAB — URINALYSIS, ROUTINE W REFLEX MICROSCOPIC
Bilirubin Urine: NEGATIVE
Glucose, UA: NEGATIVE mg/dL
Hgb urine dipstick: NEGATIVE
Ketones, ur: NEGATIVE mg/dL
Leukocytes,Ua: NEGATIVE
Nitrite: NEGATIVE
Protein, ur: NEGATIVE mg/dL
Specific Gravity, Urine: 1.008 (ref 1.005–1.030)
pH: 5 (ref 5.0–8.0)

## 2020-03-17 NOTE — ED Triage Notes (Signed)
States she has not urinated today and only once yesterday

## 2020-03-17 NOTE — ED Provider Notes (Signed)
[facility] EMERGENCY DEPARTMENT Provider Note   CSN: [id number] Arrival date & time: 03/17/20  1232     History Chief Complaint  Patient presents with  . Urinary Retention    Melanie [name] is a 86 y.o. female.  HPI     86 year old female comes in a chief complaint of urinary retention. Patient reports that she started having trouble urinating yesterday.  She was only able to urinate once yesterday.  Today she had urge to urinate, but had minimal output.  Before I saw her, she had a large void.  She denies any pain with urination.  She has had similar symptoms in the past and treated for UTI.  She also reports that she has seen Dr. Gloriann Loan, urology for assessment on her difficulty in voiding and that she is due for a repeat visit in August she thinks. Patient complains of mild lower quadrant abdominal discomfort.  She denies any bleeding with urine or stools.  Patient's abdominal pain is new.  Past Medical History:  Diagnosis Date  . CAD (coronary artery disease)    Two vessel, quiescent; 70% LAD aretery disease and well collateralized, 100% RCA by cardiac cath in 2004; NL LVF; low risk Cardioloite; EF 74%, 11/10  . Dyslipidemia   . Hypercalcemia   . Hypertension   . Hypothyroidism   . Paroxysmal atrial fibrillation (HCC)    on coumadin and amiodarone  . Sinus node dysfunction (HCC)    s/p Medtronic dual-chamber pacemaker  . Vertigo     Patient Active Problem List   Diagnosis Date Noted  . Lower extremity edema 01/08/2019  . Hyperparathyroidism, primary ([place]) 12/16/2016  . Femur fracture, right ([place]) 06/11/2012  . Anemia 08/25/2011  . Long term current use of anticoagulant 12/12/2010  . Hypothyroidism 08/12/2009  . HYPERCALCEMIA 08/12/2009  . Sinoatrial node dysfunction ([place]) 08/12/2009  . DYSPNEA ON EXERTION 08/12/2009  . PACEMAKER-Medtronic 08/12/2009  . HYPERLIPIDEMIA-MIXED 07/11/2008  . HYPERTENSION, BENIGN 07/11/2008  . CAD, NATIVE VESSEL 07/11/2008  . ATRIAL  FIBRILLATION, PAROXYSMAL 07/11/2008    Past Surgical History:  Procedure Laterality Date  . ABDOMINAL HYSTERECTOMY    . FRACTURE SURGERY    . PACEMAKER GENERATOR CHANGE N/A 02/12/2013   Procedure: PACEMAKER GENERATOR CHANGE;  Surgeon: Deboraha Sprang, MD;  Location: [facility] CATH LAB;  Service: Cardiovascular;  Laterality: N/A;  . PACEMAKER INSERTION  05/24/03   MDT Kappa     OB History   No obstetric history on file.     Family History  Problem Relation Age of Onset  . Stroke Father   . Diabetes Sister   . Hypertension Sister   . Stroke Brother   . Coronary artery disease Neg Hx     Social History   Tobacco Use  . Smoking status: Never Smoker  . Smokeless tobacco: Never Used  Substance Use Topics  . Alcohol use: No  . Drug use: No    Home Medications Prior to Admission medications   Medication Sig Start Date End Date Taking? Authorizing Provider  acetaminophen (TYLENOL) 325 MG tablet Take 650 mg by mouth every 6 (six) hours as needed.    [provider]  amiodarone (PACERONE) 100 MG tablet Take 1 tablet (100 mg total) by mouth daily. 11/30/19   Allred, Jeneen Rinks, MD  amLODipine (NORVASC) 10 MG tablet Take 10 mg by mouth daily. 12/25/18   [provider]  bisacodyl (DULCOLAX) 5 MG EC tablet Take 1 tablet (5 mg total) by mouth 2 (two) times  daily as needed for moderate constipation. 11/27/19   Evalee Jefferson, PA-C  cetirizine (ZYRTEC) 10 MG tablet Take 10 mg by mouth at bedtime. 11/09/19   [provider]  clonazePAM (KLONOPIN) 0.5 MG tablet Take 0.5 mg by mouth at bedtime. For restless leg syndrome    [provider]  DOK 100 MG capsule Take 100 mg by mouth 2 (two) times daily. 11/13/19   [provider]  Elastic Bandages & Supports ([place]) [place] 1 each by Does not apply route as directed. Knee Hi Compression Stockings to be measured prior to order-medium pressure Dx: Lower extremity edema & CHF 01/08/19   Daune Perch, NP   ezetimibe (ZETIA) 10 MG tablet Take 10 mg by mouth daily.    [provider]  furosemide (LASIX) 40 MG tablet Take 40 mg by mouth daily. 12/27/18   [provider]  HM CRANBERRY 500 MG TABS Take 1 tablet by mouth daily. 11/09/19   [provider]  isosorbide mononitrate (IMDUR) 30 MG 24 hr tablet Take 30 mg by mouth every morning.     [provider]  levothyroxine (SYNTHROID) 100 MCG tablet Take 100 mcg by mouth daily before breakfast.    [provider]  levothyroxine (SYNTHROID) 112 MCG tablet Take 112 mcg by mouth daily. 11/21/19   [provider]  metoprolol tartrate (LOPRESSOR) 25 MG tablet Take 25 mg by mouth daily.    [provider]  pantoprazole (PROTONIX) 40 MG tablet Take 40 mg by mouth daily. 11/21/19   [provider]  polyethylene glycol powder (GLYCOLAX/MIRALAX) 17 GM/SCOOP powder SMARTSIG:1 Capful(s) By Mouth Daily 11/21/19   [provider]  Probiotic Product (PRO-BIOTIC BLEND PO) Take by mouth as needed.     [provider]  warfarin (COUMADIN) 2 MG tablet Take 2 mg by mouth. Take 2mg  every day except Monday; skip Mondays - or take as directed by MD    [provider]    Allergies    Ciprofloxacin, Hctz [hydrochlorothiazide], and Lopid [gemfibrozil]  Review of Systems   Review of Systems  Constitutional: Positive for activity change.  Gastrointestinal: Negative for abdominal distention, abdominal pain, nausea and vomiting.  Genitourinary: Positive for decreased urine volume and pelvic pain. Negative for dysuria, flank pain, frequency and vaginal bleeding.  Musculoskeletal: Positive for back pain.  Allergic/Immunologic: Negative for immunocompromised state.    Physical Exam Updated Vital Signs BP 124/64   Pulse 70   Temp 98.3 F (36.8 C)   Resp 14   Wt 69.5 kg   SpO2 98%   BMI 27.16 kg/m   Physical Exam Vitals and nursing note reviewed.  Constitutional:       Appearance: She is well-developed.  HENT:     Head: Normocephalic and atraumatic.  Cardiovascular:     Rate and Rhythm: Normal rate.  Pulmonary:     Effort: Pulmonary effort is normal.  Abdominal:     General: Bowel sounds are normal. There is distension.     Palpations: There is no mass.     Tenderness: There is no abdominal tenderness.  Musculoskeletal:     Cervical back: Normal range of motion and neck supple.  Skin:    General: Skin is warm and dry.  Neurological:     Mental Status: She is alert and oriented to person, place, and time.     ED Results / Procedures / Treatments   Labs (all labs ordered are listed, but only abnormal results are displayed)  Labs Reviewed  URINALYSIS, ROUTINE W REFLEX MICROSCOPIC - Abnormal; Notable for the following components:      Result Value   Color, Urine STRAW (*)    All other components within normal limits  COMPREHENSIVE METABOLIC PANEL - Abnormal; Notable for the following components:   Glucose, Bld 114 (*)    BUN 24 (*)    Creatinine, Ser 1.34 (*)    GFR calc non Af Amer 35 (*)    GFR calc Af Amer 40 (*)    All other components within normal limits  CBC WITH DIFFERENTIAL/PLATELET - Abnormal; Notable for the following components:   Hemoglobin 11.4 (*)    All other components within normal limits  URINE CULTURE    EKG None  Radiology No results found.  Procedures Procedures (including critical care time)  Medications Ordered in ED Medications - No data to display  ED Course  I have reviewed the triage vital signs and the nursing notes.  Pertinent labs & imaging results that were available during my care of the patient were reviewed by me and considered in my medical decision making (see chart for details).    MDM Rules/Calculators/A&P                          86 year old female comes in a chief complaint of difficulty in voiding. She has history of CAD, UTI, complete hysterectomy.  She reports diminished voiding  since yesterday.  We will order bladder scan, however patient had a large void, 400 cc per RN.  Postvoid residual was 13 cc.  She is noted to have mild abdominal distention without any peritoneal findings.  Patient is not having clear UTI-like symptoms, however she has been treated with that diagnosis in the past.  Reviewed the CT scan she had done in March.  At that time it appears that the bladder was inflamed and there was concern that the radiologic finding could be because of UTI versus neoplasm.  I am a bit concerned that her difficulty voiding could be related to bladder dysfunction, possibly even tumor.  Fortunately patient is already established with urology.  I will send a message to Dr. Gloriann Loan about this visit.  Patient's urine analysis does not reveal any evidence of infection.  Urine culture sent.  Hemodynamically she is stable and not showing any signs of an infection.  The patient appears reasonably screened and/or stabilized for discharge and I doubt any other medical condition or other [facility] requiring further screening, evaluation, or treatment in the ED at this time prior to discharge.   Results from the ER workup discussed with the patient face to face and all questions answered to the best of my ability. The patient is safe for discharge with strict return precautions.   Final Clinical Impression(s) / ED Diagnoses Final diagnoses:  Difficulty voiding    Rx / DC Orders ED Discharge Orders    None       Varney Biles, MD 03/17/20 1816

## 2020-03-17 NOTE — Discharge Instructions (Addendum)
We signed the ER with difficulty in voiding. The results in the ER are reassuring.  No signs of infection.  Recommend that you follow-up with a urologist for further evaluation for your difficulty in voiding. Dr. Gloriann Loan, from urology has also been informed about this visit today.

## 2020-03-19 LAB — URINE CULTURE

## 2020-03-26 DIAGNOSIS — I482 Chronic atrial fibrillation, unspecified: Secondary | ICD-10-CM | POA: Diagnosis not present

## 2020-03-26 DIAGNOSIS — I10 Essential (primary) hypertension: Secondary | ICD-10-CM | POA: Diagnosis not present

## 2020-03-26 DIAGNOSIS — E039 Hypothyroidism, unspecified: Secondary | ICD-10-CM | POA: Diagnosis not present

## 2020-03-26 DIAGNOSIS — E7849 Other hyperlipidemia: Secondary | ICD-10-CM | POA: Diagnosis not present

## 2020-03-31 IMAGING — CT CT ABD-PELV W/ CM
2 of 5 series · 15 of 46 positions shown, 17 images · IV contrast (Omnipaque or Isovue)
Comparison: KUB 11/27/2019, CT 08/10/2014

CLINICAL DATA: Abdomen distension

EXAM:
CT ABDOMEN AND PELVIS WITH CONTRAST
TECHNIQUE: Multidetector CT imaging of the abdomen and pelvis was performed
using the standard protocol following bolus administration of
intravenous contrast.
CONTRAST:  75mL OMNIPAQUE IOHEXOL 300 MG/ML  SOLN

[Series 2: axial st · axial · 0.68mm/px · z∈[+870,+1270]mm · 12 of 94 slices shown, 14 images]
[im 7/94  soft-tissue]
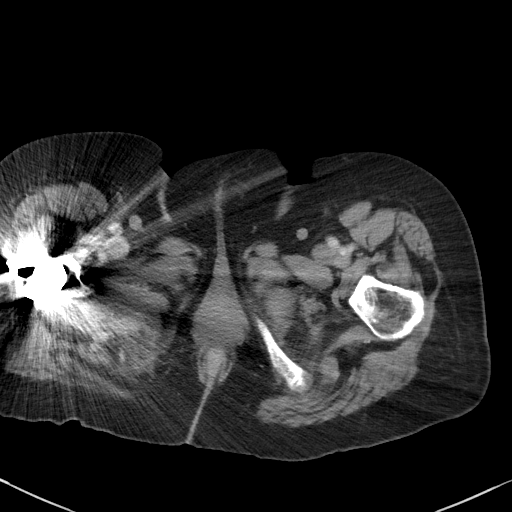
[im 7/94  bone]
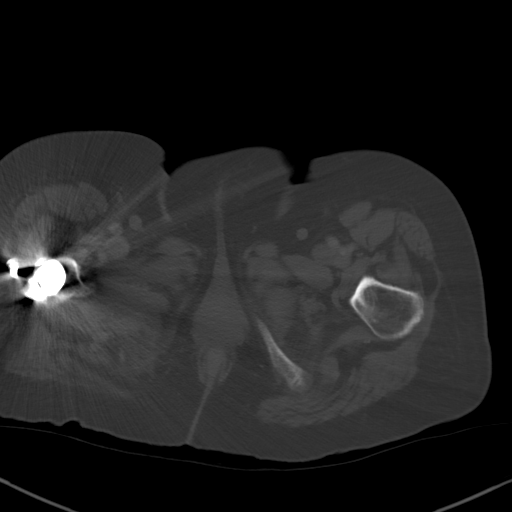
[im 14/94  soft-tissue]
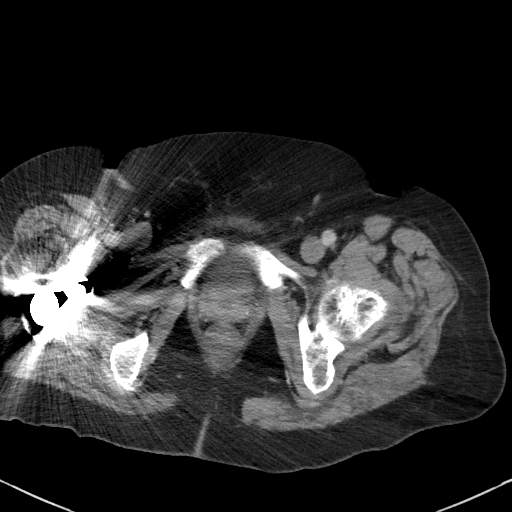
[im 20/94  soft-tissue]
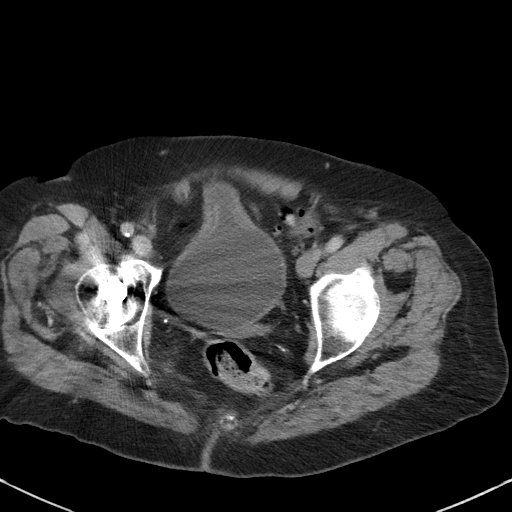
[im 27/94  soft-tissue]
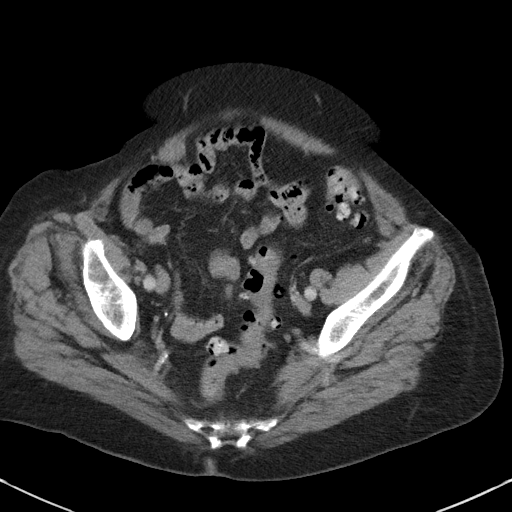
[im 34/94  soft-tissue]
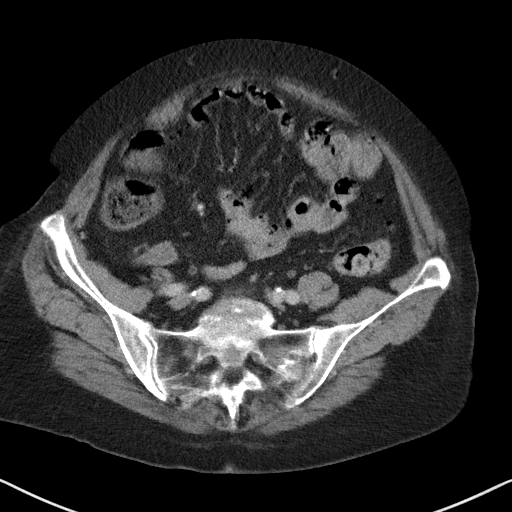
[im 40/94  soft-tissue]
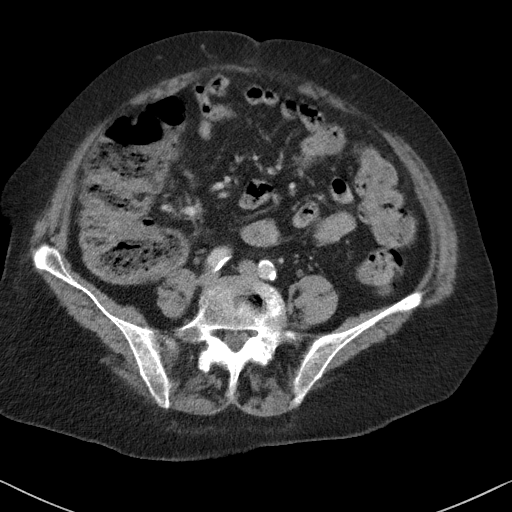
[im 54/94  soft-tissue]
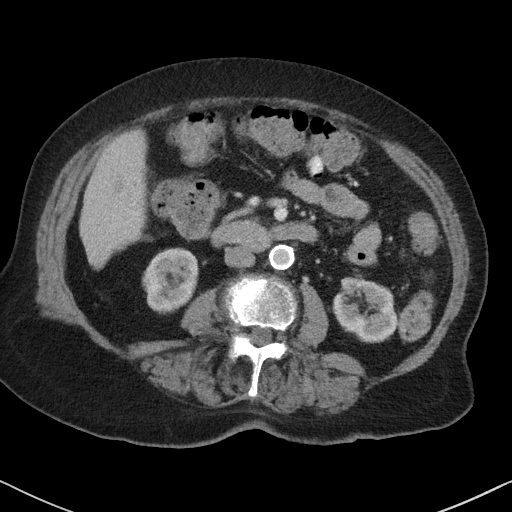
[im 60/94  soft-tissue]
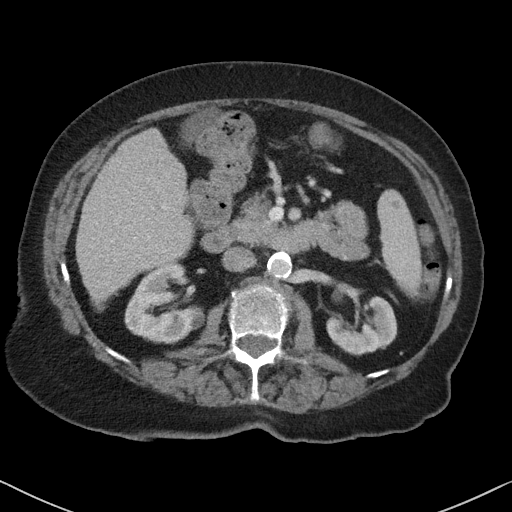
[im 67/94  soft-tissue]
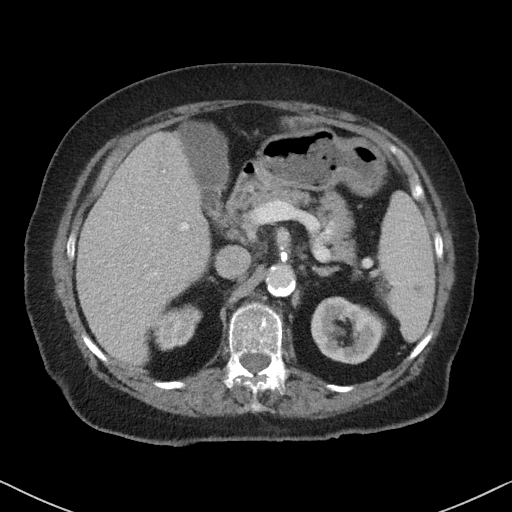
[im 67/94  bone]
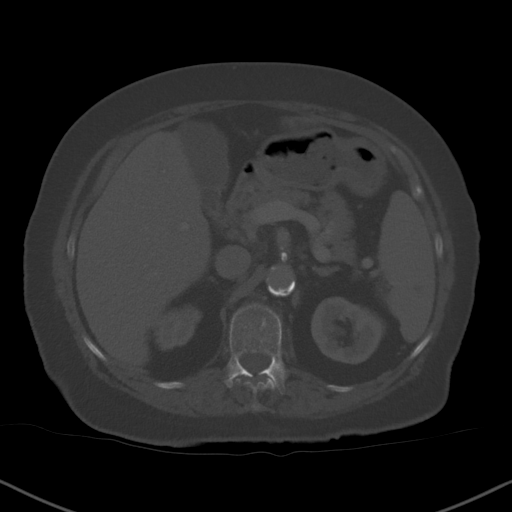
[im 74/94  soft-tissue]
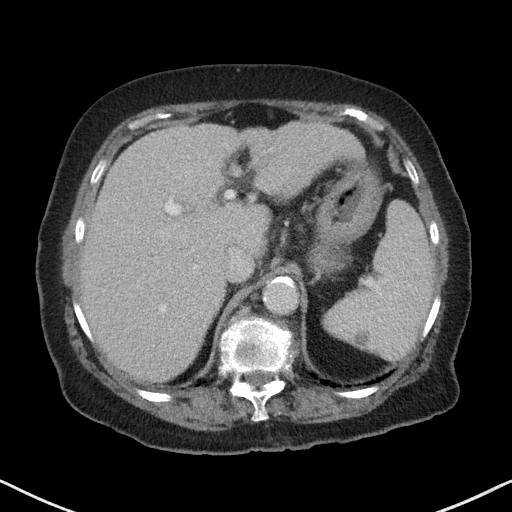
[im 80/94  soft-tissue]
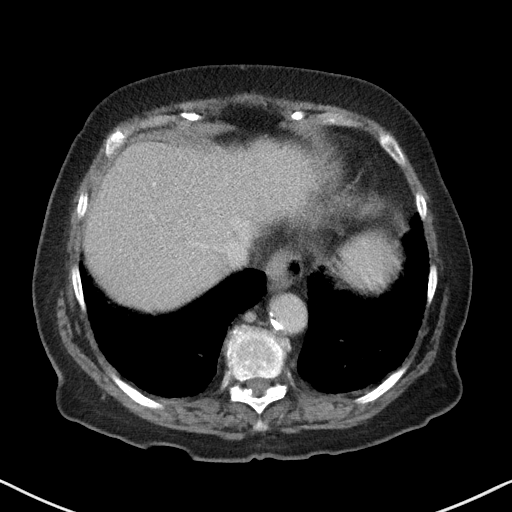
[im 87/94  soft-tissue]
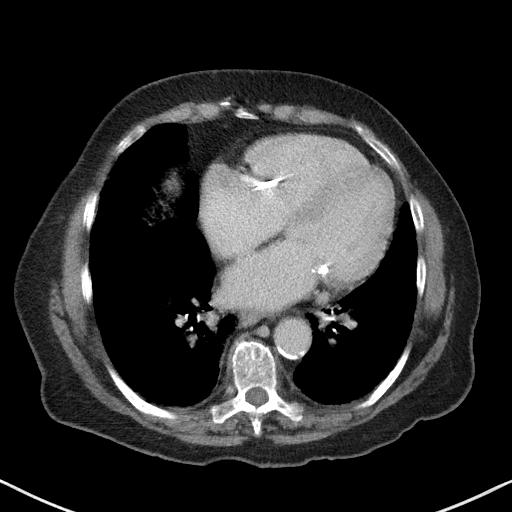

[Series 5: coronal st · coronal · 0.65mm/px · 3 of 90 slices shown]
[im 30/90  soft-tissue]
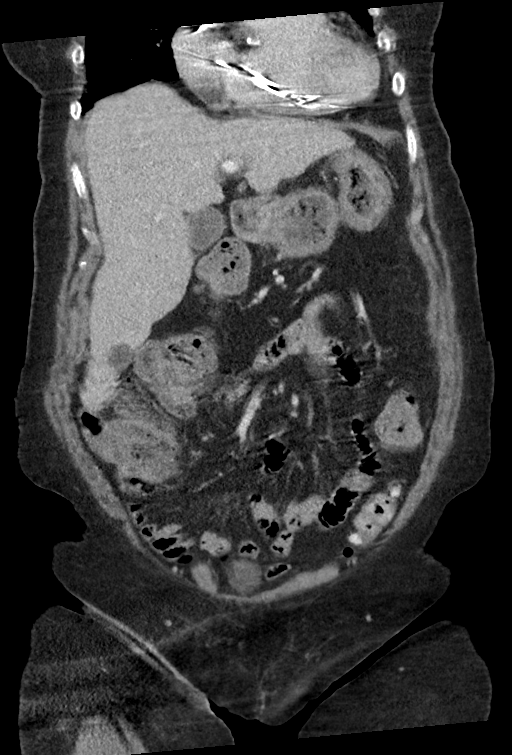
[im 40/90  soft-tissue]
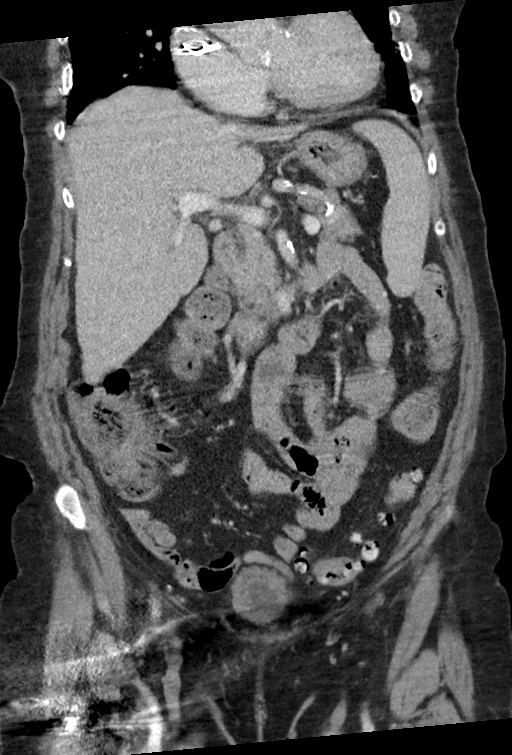
[im 50/90  soft-tissue]
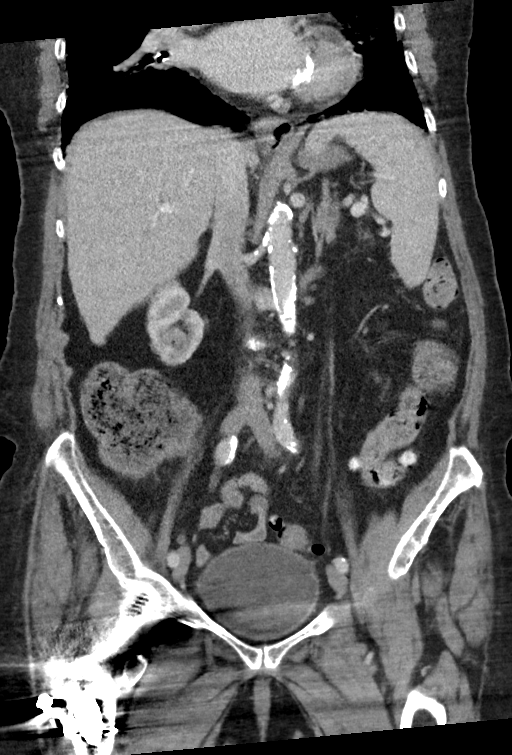

[15 of 46 positions shown; findings below may reference images not displayed]

FINDINGS: Lower chest: Lung bases demonstrate no acute consolidation or
effusion. Subpleural fibrosis, progressed since 7241. Cardiomegaly
with partially visualized intracardiac pacing leads. Mitral
calcifications. Small hiatal hernia.

Hepatobiliary: Liver contour appears slightly nodular and lobulated.
No calcified gallstone. No biliary dilatation. Stable right hepatic
cysts.

Pancreas: Unremarkable. No pancreatic ductal dilatation or
surrounding inflammatory changes.

Spleen: Hypodense splenic lesions, some of which were present on
comparison study. 1 cm new hypodense posterior spleen lesion, series
2, image number 21.

Adrenals/Urinary Tract: Adrenal glands are normal. Kidneys show no
hydronephrosis. Bilateral renal cysts. Funneled appearance of the
anterior bladder with wall thickening.

Stomach/Bowel: Stomach nonenlarged. No dilated small bowel. Large
volume of stool in the colon. Left colon diverticula without acute
inflammatory process. Appendix not well seen but no right lower
quadrant inflammatory process.

Vascular/Lymphatic: Extensive aortic atherosclerosis without
aneurysm. No significant adenopathy

Reproductive: Status post hysterectomy. No adnexal masses.

Other: Negative for free air or free fluid.

Musculoskeletal: Possible acute to subacute superior endplate
fracture at T11 with lucency at the posterior vertebral body.
IMPRESSION: 1. Nonobstructed bowel gas pattern. Large volume of stool in the
colon suggesting constipation. Left colon diverticular disease
without acute inflammatory process.
2. Lobulated liver contour suggesting possible cirrhosis.
3. New 1 cm hypodense lesion within the posterior spleen. CT
follow-up in 6 months may be considered to document stability, given
the presence of other similar appearing lesions in the spleen,
several of which are stable compared to the prior CT.
4. Focal anterior bladder wall thickening, consider focal cystitis
versus neoplasm. Direct visualization as clinically indicated.
5. Possible acute or subacute superior endplate fracture at T11 with
suspected lucency in the posterior vertebral body.

## 2020-04-08 DIAGNOSIS — L600 Ingrowing nail: Secondary | ICD-10-CM | POA: Diagnosis not present

## 2020-04-08 DIAGNOSIS — M79675 Pain in left toe(s): Secondary | ICD-10-CM | POA: Diagnosis not present

## 2020-04-08 DIAGNOSIS — M79674 Pain in right toe(s): Secondary | ICD-10-CM | POA: Diagnosis not present

## 2020-04-16 DIAGNOSIS — U071 COVID-19: Secondary | ICD-10-CM | POA: Diagnosis not present

## 2020-04-23 DIAGNOSIS — N3020 Other chronic cystitis without hematuria: Secondary | ICD-10-CM | POA: Diagnosis not present

## 2020-04-25 DIAGNOSIS — I482 Chronic atrial fibrillation, unspecified: Secondary | ICD-10-CM | POA: Diagnosis not present

## 2020-04-25 DIAGNOSIS — E7849 Other hyperlipidemia: Secondary | ICD-10-CM | POA: Diagnosis not present

## 2020-04-25 DIAGNOSIS — I10 Essential (primary) hypertension: Secondary | ICD-10-CM | POA: Diagnosis not present

## 2020-04-25 DIAGNOSIS — E039 Hypothyroidism, unspecified: Secondary | ICD-10-CM | POA: Diagnosis not present

## 2020-05-08 DIAGNOSIS — K219 Gastro-esophageal reflux disease without esophagitis: Secondary | ICD-10-CM | POA: Diagnosis not present

## 2020-05-08 DIAGNOSIS — S22000A Wedge compression fracture of unspecified thoracic vertebra, initial encounter for closed fracture: Secondary | ICD-10-CM | POA: Diagnosis not present

## 2020-05-08 DIAGNOSIS — K5900 Constipation, unspecified: Secondary | ICD-10-CM | POA: Diagnosis not present

## 2020-05-08 DIAGNOSIS — Z950 Presence of cardiac pacemaker: Secondary | ICD-10-CM | POA: Diagnosis not present

## 2020-05-08 DIAGNOSIS — I482 Chronic atrial fibrillation, unspecified: Secondary | ICD-10-CM | POA: Diagnosis not present

## 2020-05-14 DIAGNOSIS — H9202 Otalgia, left ear: Secondary | ICD-10-CM | POA: Diagnosis not present

## 2020-05-14 DIAGNOSIS — M542 Cervicalgia: Secondary | ICD-10-CM | POA: Diagnosis not present

## 2020-05-16 DIAGNOSIS — R918 Other nonspecific abnormal finding of lung field: Secondary | ICD-10-CM | POA: Diagnosis not present

## 2020-05-16 DIAGNOSIS — E039 Hypothyroidism, unspecified: Secondary | ICD-10-CM | POA: Diagnosis not present

## 2020-05-16 DIAGNOSIS — N390 Urinary tract infection, site not specified: Secondary | ICD-10-CM | POA: Diagnosis not present

## 2020-05-16 DIAGNOSIS — R52 Pain, unspecified: Secondary | ICD-10-CM | POA: Diagnosis not present

## 2020-05-16 DIAGNOSIS — K449 Diaphragmatic hernia without obstruction or gangrene: Secondary | ICD-10-CM | POA: Diagnosis not present

## 2020-05-16 DIAGNOSIS — K5730 Diverticulosis of large intestine without perforation or abscess without bleeding: Secondary | ICD-10-CM | POA: Diagnosis not present

## 2020-05-16 DIAGNOSIS — G894 Chronic pain syndrome: Secondary | ICD-10-CM | POA: Diagnosis not present

## 2020-05-16 DIAGNOSIS — R69 Illness, unspecified: Secondary | ICD-10-CM | POA: Diagnosis not present

## 2020-05-16 DIAGNOSIS — Z7901 Long term (current) use of anticoagulants: Secondary | ICD-10-CM | POA: Diagnosis not present

## 2020-05-16 DIAGNOSIS — N281 Cyst of kidney, acquired: Secondary | ICD-10-CM | POA: Diagnosis not present

## 2020-05-16 DIAGNOSIS — Z792 Long term (current) use of antibiotics: Secondary | ICD-10-CM | POA: Diagnosis not present

## 2020-05-16 DIAGNOSIS — K219 Gastro-esophageal reflux disease without esophagitis: Secondary | ICD-10-CM | POA: Diagnosis not present

## 2020-05-16 DIAGNOSIS — R5381 Other malaise: Secondary | ICD-10-CM | POA: Diagnosis not present

## 2020-05-16 DIAGNOSIS — I480 Paroxysmal atrial fibrillation: Secondary | ICD-10-CM | POA: Diagnosis not present

## 2020-05-16 DIAGNOSIS — I10 Essential (primary) hypertension: Secondary | ICD-10-CM | POA: Diagnosis not present

## 2020-05-16 DIAGNOSIS — I4891 Unspecified atrial fibrillation: Secondary | ICD-10-CM | POA: Diagnosis not present

## 2020-05-16 DIAGNOSIS — Z881 Allergy status to other antibiotic agents status: Secondary | ICD-10-CM | POA: Diagnosis not present

## 2020-05-16 DIAGNOSIS — R404 Transient alteration of awareness: Secondary | ICD-10-CM | POA: Diagnosis not present

## 2020-05-16 DIAGNOSIS — M542 Cervicalgia: Secondary | ICD-10-CM | POA: Diagnosis not present

## 2020-05-16 DIAGNOSIS — Z7401 Bed confinement status: Secondary | ICD-10-CM | POA: Diagnosis not present

## 2020-05-16 DIAGNOSIS — D649 Anemia, unspecified: Secondary | ICD-10-CM | POA: Diagnosis not present

## 2020-05-16 DIAGNOSIS — Z743 Need for continuous supervision: Secondary | ICD-10-CM | POA: Diagnosis not present

## 2020-05-17 DIAGNOSIS — I4891 Unspecified atrial fibrillation: Secondary | ICD-10-CM | POA: Diagnosis not present

## 2020-05-17 DIAGNOSIS — N390 Urinary tract infection, site not specified: Secondary | ICD-10-CM | POA: Diagnosis not present

## 2020-05-17 DIAGNOSIS — D649 Anemia, unspecified: Secondary | ICD-10-CM | POA: Diagnosis not present

## 2020-05-17 DIAGNOSIS — U071 COVID-19: Secondary | ICD-10-CM | POA: Diagnosis not present

## 2020-05-18 DIAGNOSIS — M542 Cervicalgia: Secondary | ICD-10-CM | POA: Diagnosis not present

## 2020-05-18 DIAGNOSIS — N390 Urinary tract infection, site not specified: Secondary | ICD-10-CM | POA: Diagnosis not present

## 2020-05-18 DIAGNOSIS — D649 Anemia, unspecified: Secondary | ICD-10-CM | POA: Diagnosis not present

## 2020-05-18 DIAGNOSIS — I4891 Unspecified atrial fibrillation: Secondary | ICD-10-CM | POA: Diagnosis not present

## 2020-05-19 DIAGNOSIS — I4891 Unspecified atrial fibrillation: Secondary | ICD-10-CM | POA: Diagnosis not present

## 2020-05-19 DIAGNOSIS — D649 Anemia, unspecified: Secondary | ICD-10-CM | POA: Diagnosis not present

## 2020-05-19 DIAGNOSIS — M542 Cervicalgia: Secondary | ICD-10-CM | POA: Diagnosis not present

## 2020-05-19 DIAGNOSIS — N390 Urinary tract infection, site not specified: Secondary | ICD-10-CM | POA: Diagnosis not present

## 2020-05-20 DIAGNOSIS — N390 Urinary tract infection, site not specified: Secondary | ICD-10-CM | POA: Diagnosis not present

## 2020-05-20 DIAGNOSIS — M542 Cervicalgia: Secondary | ICD-10-CM | POA: Diagnosis not present

## 2020-05-20 DIAGNOSIS — I480 Paroxysmal atrial fibrillation: Secondary | ICD-10-CM | POA: Diagnosis not present

## 2020-05-20 DIAGNOSIS — D649 Anemia, unspecified: Secondary | ICD-10-CM | POA: Diagnosis not present

## 2020-05-21 DIAGNOSIS — M542 Cervicalgia: Secondary | ICD-10-CM | POA: Diagnosis not present

## 2020-05-21 DIAGNOSIS — N390 Urinary tract infection, site not specified: Secondary | ICD-10-CM | POA: Diagnosis not present

## 2020-05-21 DIAGNOSIS — D649 Anemia, unspecified: Secondary | ICD-10-CM | POA: Diagnosis not present

## 2020-05-21 DIAGNOSIS — I4891 Unspecified atrial fibrillation: Secondary | ICD-10-CM | POA: Diagnosis not present

## 2020-05-22 DIAGNOSIS — D649 Anemia, unspecified: Secondary | ICD-10-CM | POA: Diagnosis not present

## 2020-05-22 DIAGNOSIS — I4891 Unspecified atrial fibrillation: Secondary | ICD-10-CM | POA: Diagnosis not present

## 2020-05-22 DIAGNOSIS — R69 Illness, unspecified: Secondary | ICD-10-CM | POA: Diagnosis not present

## 2020-05-22 DIAGNOSIS — R5381 Other malaise: Secondary | ICD-10-CM | POA: Diagnosis not present

## 2020-05-22 DIAGNOSIS — N390 Urinary tract infection, site not specified: Secondary | ICD-10-CM | POA: Diagnosis not present

## 2020-05-22 DIAGNOSIS — Z743 Need for continuous supervision: Secondary | ICD-10-CM | POA: Diagnosis not present

## 2020-05-22 DIAGNOSIS — Z7401 Bed confinement status: Secondary | ICD-10-CM | POA: Diagnosis not present

## 2020-05-25 DIAGNOSIS — S0990XA Unspecified injury of head, initial encounter: Secondary | ICD-10-CM | POA: Diagnosis not present

## 2020-05-25 DIAGNOSIS — I4891 Unspecified atrial fibrillation: Secondary | ICD-10-CM | POA: Diagnosis not present

## 2020-05-25 DIAGNOSIS — W06XXXA Fall from bed, initial encounter: Secondary | ICD-10-CM | POA: Diagnosis not present

## 2020-05-25 DIAGNOSIS — Z743 Need for continuous supervision: Secondary | ICD-10-CM | POA: Diagnosis not present

## 2020-05-25 DIAGNOSIS — S0003XA Contusion of scalp, initial encounter: Secondary | ICD-10-CM | POA: Diagnosis not present

## 2020-05-25 DIAGNOSIS — I700 Atherosclerosis of aorta: Secondary | ICD-10-CM | POA: Diagnosis not present

## 2020-05-25 DIAGNOSIS — G8929 Other chronic pain: Secondary | ICD-10-CM | POA: Diagnosis not present

## 2020-05-25 DIAGNOSIS — W19XXXA Unspecified fall, initial encounter: Secondary | ICD-10-CM | POA: Diagnosis not present

## 2020-05-25 DIAGNOSIS — K5730 Diverticulosis of large intestine without perforation or abscess without bleeding: Secondary | ICD-10-CM | POA: Diagnosis not present

## 2020-05-25 DIAGNOSIS — S199XXA Unspecified injury of neck, initial encounter: Secondary | ICD-10-CM | POA: Diagnosis not present

## 2020-05-25 DIAGNOSIS — M2578 Osteophyte, vertebrae: Secondary | ICD-10-CM | POA: Diagnosis not present

## 2020-05-25 DIAGNOSIS — Z043 Encounter for examination and observation following other accident: Secondary | ICD-10-CM | POA: Diagnosis not present

## 2020-05-25 DIAGNOSIS — S0001XA Abrasion of scalp, initial encounter: Secondary | ICD-10-CM | POA: Diagnosis not present

## 2020-05-25 DIAGNOSIS — R404 Transient alteration of awareness: Secondary | ICD-10-CM | POA: Diagnosis not present

## 2020-05-25 DIAGNOSIS — S32029A Unspecified fracture of second lumbar vertebra, initial encounter for closed fracture: Secondary | ICD-10-CM | POA: Diagnosis not present

## 2020-05-25 DIAGNOSIS — I7090 Unspecified atherosclerosis: Secondary | ICD-10-CM | POA: Diagnosis not present

## 2020-05-25 DIAGNOSIS — I6529 Occlusion and stenosis of unspecified carotid artery: Secondary | ICD-10-CM | POA: Diagnosis not present

## 2020-05-25 DIAGNOSIS — S300XXA Contusion of lower back and pelvis, initial encounter: Secondary | ICD-10-CM | POA: Diagnosis not present

## 2020-05-25 DIAGNOSIS — S32028A Other fracture of second lumbar vertebra, initial encounter for closed fracture: Secondary | ICD-10-CM | POA: Diagnosis not present

## 2020-05-25 DIAGNOSIS — R6889 Other general symptoms and signs: Secondary | ICD-10-CM | POA: Diagnosis not present

## 2020-05-27 DIAGNOSIS — E039 Hypothyroidism, unspecified: Secondary | ICD-10-CM | POA: Diagnosis not present

## 2020-05-27 DIAGNOSIS — I10 Essential (primary) hypertension: Secondary | ICD-10-CM | POA: Diagnosis not present

## 2020-05-27 DIAGNOSIS — I482 Chronic atrial fibrillation, unspecified: Secondary | ICD-10-CM | POA: Diagnosis not present

## 2020-05-27 DIAGNOSIS — E7849 Other hyperlipidemia: Secondary | ICD-10-CM | POA: Diagnosis not present

## 2020-05-30 ENCOUNTER — Encounter: Payer: Medicare Other | Admitting: Internal Medicine

## 2020-05-30 DIAGNOSIS — I2510 Atherosclerotic heart disease of native coronary artery without angina pectoris: Secondary | ICD-10-CM | POA: Diagnosis not present

## 2020-05-30 DIAGNOSIS — E785 Hyperlipidemia, unspecified: Secondary | ICD-10-CM | POA: Diagnosis not present

## 2020-05-30 DIAGNOSIS — E039 Hypothyroidism, unspecified: Secondary | ICD-10-CM | POA: Diagnosis not present

## 2020-05-30 DIAGNOSIS — I119 Hypertensive heart disease without heart failure: Secondary | ICD-10-CM | POA: Diagnosis not present

## 2020-05-30 DIAGNOSIS — D649 Anemia, unspecified: Secondary | ICD-10-CM | POA: Diagnosis not present

## 2020-05-30 DIAGNOSIS — Z950 Presence of cardiac pacemaker: Secondary | ICD-10-CM | POA: Diagnosis not present

## 2020-05-30 DIAGNOSIS — I480 Paroxysmal atrial fibrillation: Secondary | ICD-10-CM | POA: Diagnosis not present

## 2020-05-30 DIAGNOSIS — Z87440 Personal history of urinary (tract) infections: Secondary | ICD-10-CM | POA: Diagnosis not present

## 2020-05-31 DIAGNOSIS — S39012A Strain of muscle, fascia and tendon of lower back, initial encounter: Secondary | ICD-10-CM | POA: Diagnosis not present

## 2020-05-31 DIAGNOSIS — R300 Dysuria: Secondary | ICD-10-CM | POA: Diagnosis not present

## 2020-05-31 DIAGNOSIS — M549 Dorsalgia, unspecified: Secondary | ICD-10-CM | POA: Diagnosis not present

## 2020-06-03 DIAGNOSIS — D649 Anemia, unspecified: Secondary | ICD-10-CM | POA: Diagnosis not present

## 2020-06-03 DIAGNOSIS — I119 Hypertensive heart disease without heart failure: Secondary | ICD-10-CM | POA: Diagnosis not present

## 2020-06-03 DIAGNOSIS — E039 Hypothyroidism, unspecified: Secondary | ICD-10-CM | POA: Diagnosis not present

## 2020-06-03 DIAGNOSIS — Z950 Presence of cardiac pacemaker: Secondary | ICD-10-CM | POA: Diagnosis not present

## 2020-06-03 DIAGNOSIS — E785 Hyperlipidemia, unspecified: Secondary | ICD-10-CM | POA: Diagnosis not present

## 2020-06-03 DIAGNOSIS — I2510 Atherosclerotic heart disease of native coronary artery without angina pectoris: Secondary | ICD-10-CM | POA: Diagnosis not present

## 2020-06-03 DIAGNOSIS — Z87440 Personal history of urinary (tract) infections: Secondary | ICD-10-CM | POA: Diagnosis not present

## 2020-06-03 DIAGNOSIS — I480 Paroxysmal atrial fibrillation: Secondary | ICD-10-CM | POA: Diagnosis not present

## 2020-06-06 DIAGNOSIS — Z950 Presence of cardiac pacemaker: Secondary | ICD-10-CM | POA: Diagnosis not present

## 2020-06-06 DIAGNOSIS — I119 Hypertensive heart disease without heart failure: Secondary | ICD-10-CM | POA: Diagnosis not present

## 2020-06-06 DIAGNOSIS — Z87440 Personal history of urinary (tract) infections: Secondary | ICD-10-CM | POA: Diagnosis not present

## 2020-06-06 DIAGNOSIS — E785 Hyperlipidemia, unspecified: Secondary | ICD-10-CM | POA: Diagnosis not present

## 2020-06-06 DIAGNOSIS — E039 Hypothyroidism, unspecified: Secondary | ICD-10-CM | POA: Diagnosis not present

## 2020-06-06 DIAGNOSIS — I2510 Atherosclerotic heart disease of native coronary artery without angina pectoris: Secondary | ICD-10-CM | POA: Diagnosis not present

## 2020-06-06 DIAGNOSIS — I480 Paroxysmal atrial fibrillation: Secondary | ICD-10-CM | POA: Diagnosis not present

## 2020-06-06 DIAGNOSIS — D649 Anemia, unspecified: Secondary | ICD-10-CM | POA: Diagnosis not present

## 2020-06-07 DIAGNOSIS — E785 Hyperlipidemia, unspecified: Secondary | ICD-10-CM | POA: Diagnosis not present

## 2020-06-07 DIAGNOSIS — I480 Paroxysmal atrial fibrillation: Secondary | ICD-10-CM | POA: Diagnosis not present

## 2020-06-07 DIAGNOSIS — D649 Anemia, unspecified: Secondary | ICD-10-CM | POA: Diagnosis not present

## 2020-06-07 DIAGNOSIS — I2510 Atherosclerotic heart disease of native coronary artery without angina pectoris: Secondary | ICD-10-CM | POA: Diagnosis not present

## 2020-06-07 DIAGNOSIS — Z950 Presence of cardiac pacemaker: Secondary | ICD-10-CM | POA: Diagnosis not present

## 2020-06-07 DIAGNOSIS — I119 Hypertensive heart disease without heart failure: Secondary | ICD-10-CM | POA: Diagnosis not present

## 2020-06-07 DIAGNOSIS — E039 Hypothyroidism, unspecified: Secondary | ICD-10-CM | POA: Diagnosis not present

## 2020-06-07 DIAGNOSIS — Z87440 Personal history of urinary (tract) infections: Secondary | ICD-10-CM | POA: Diagnosis not present

## 2020-06-10 DIAGNOSIS — E039 Hypothyroidism, unspecified: Secondary | ICD-10-CM | POA: Diagnosis not present

## 2020-06-10 DIAGNOSIS — Z87440 Personal history of urinary (tract) infections: Secondary | ICD-10-CM | POA: Diagnosis not present

## 2020-06-10 DIAGNOSIS — Z950 Presence of cardiac pacemaker: Secondary | ICD-10-CM | POA: Diagnosis not present

## 2020-06-10 DIAGNOSIS — E785 Hyperlipidemia, unspecified: Secondary | ICD-10-CM | POA: Diagnosis not present

## 2020-06-10 DIAGNOSIS — I480 Paroxysmal atrial fibrillation: Secondary | ICD-10-CM | POA: Diagnosis not present

## 2020-06-10 DIAGNOSIS — I2510 Atherosclerotic heart disease of native coronary artery without angina pectoris: Secondary | ICD-10-CM | POA: Diagnosis not present

## 2020-06-10 DIAGNOSIS — I119 Hypertensive heart disease without heart failure: Secondary | ICD-10-CM | POA: Diagnosis not present

## 2020-06-10 DIAGNOSIS — D649 Anemia, unspecified: Secondary | ICD-10-CM | POA: Diagnosis not present

## 2020-06-11 DIAGNOSIS — E039 Hypothyroidism, unspecified: Secondary | ICD-10-CM | POA: Diagnosis not present

## 2020-06-11 DIAGNOSIS — D649 Anemia, unspecified: Secondary | ICD-10-CM | POA: Diagnosis not present

## 2020-06-11 DIAGNOSIS — Z87440 Personal history of urinary (tract) infections: Secondary | ICD-10-CM | POA: Diagnosis not present

## 2020-06-11 DIAGNOSIS — E785 Hyperlipidemia, unspecified: Secondary | ICD-10-CM | POA: Diagnosis not present

## 2020-06-11 DIAGNOSIS — I119 Hypertensive heart disease without heart failure: Secondary | ICD-10-CM | POA: Diagnosis not present

## 2020-06-11 DIAGNOSIS — I480 Paroxysmal atrial fibrillation: Secondary | ICD-10-CM | POA: Diagnosis not present

## 2020-06-11 DIAGNOSIS — Z950 Presence of cardiac pacemaker: Secondary | ICD-10-CM | POA: Diagnosis not present

## 2020-06-11 DIAGNOSIS — I2510 Atherosclerotic heart disease of native coronary artery without angina pectoris: Secondary | ICD-10-CM | POA: Diagnosis not present

## 2020-06-15 NOTE — Progress Notes (Signed)
THIS IS A TELEMEDICINE VISIT   Patient location: Home  Office location: Office  Cardiology Office Note  Date: 06/16/2020   ID: Melanie [name], DOB [birth year], MRN [id number]  PCP:  [facility]  Cardiologist:  No primary care provider on file. Electrophysiologist:  Thompson Grayer, MD   Chief Complaint: CAD  History of Present Illness: Melanie [name] is a 86 y.o. female with a history of CAD, HLD, HTN, PAF, Pacemaker s/p symptomatic bradycardia.   Last encounter with Dr Bronson Ing via telemedicine 12/21/2019. She had recently seen Dr Rayann Heman on 11/30/2019 who increased her amiodarone. Her CAD was symptomatically stable on current medical therapy which included amlodipine, metoprolol and long-acting nitrates.  Not on statin but taking Zetia.  Not on aspirin due to taking warfarin.  Following with Dr. Rayann Heman for atrial fibrillation and device management.  TFTs, PFTs, and LFTs followed by PCP.  Pacemaker was functioning normally.  She was no longer using compression stockings and had no leg swelling at last visit.  She continued her Lasix.  Saw Dr Rayann Heman 11/30/2019: She denied any palpitations or tachcardia, Her PCP had reduced her Amiodarone to 50 mg po QOD  Recent ED presentations and admission with difficulty voiding and delirium with associated UTI, atrial fibrillation. Recent fall from bed at SNF.Closed compression fx of L2.    Spoke with patient today at the skilled nursing facility where she resides.  She denies any significant issues other than some chronic back pain.  She has a close compression fracture of L2.  She had a recent fall from her bed.  She has had some delirium associated with recent UTI and difficulty voiding.  She denies any issues with her atrial fibrillation.  Denies any palpitations or arrhythmias.  States she is not very active due to significant back pain has been laying down a lot.  States her swelling in her legs has improved since she lays around a lot  versus being up and around ambulating as before.  She denies any significant exertional dyspnea, PND, orthopnea, bleeding issues.  States she recently had some labs at [facility] but was not told what the results were.   Past Medical History:  Diagnosis Date  . CAD (coronary artery disease)    Two vessel, quiescent; 70% LAD aretery disease and well collateralized, 100% RCA by cardiac cath in 2004; NL LVF; low risk Cardioloite; EF 74%, 11/10  . Dyslipidemia   . Hypercalcemia   . Hypertension   . Hypothyroidism   . Paroxysmal atrial fibrillation (HCC)    on coumadin and amiodarone  . Sinus node dysfunction (HCC)    s/p Medtronic dual-chamber pacemaker  . Vertigo     Past Surgical History:  Procedure Laterality Date  . ABDOMINAL HYSTERECTOMY    . FRACTURE SURGERY    . PACEMAKER GENERATOR CHANGE N/A 02/12/2013   Procedure: PACEMAKER GENERATOR CHANGE;  Surgeon: Deboraha Sprang, MD;  Location: [facility] CATH LAB;  Service: Cardiovascular;  Laterality: N/A;  . PACEMAKER INSERTION  05/24/03   MDT Kappa    Current Outpatient Medications  Medication Sig Dispense Refill  . acetaminophen (TYLENOL) 325 MG tablet Take 650 mg by mouth every 6 (six) hours as needed.    Marland Kitchen amiodarone (PACERONE) 100 MG tablet Take 1 tablet (100 mg total) by mouth daily.    Marland Kitchen amLODipine (NORVASC) 10 MG tablet Take 10 mg by mouth daily.    . bisacodyl (DULCOLAX) 5 MG EC tablet Take 1 tablet (5 mg total) by mouth  2 (two) times daily as needed for moderate constipation. 14 tablet 0  . cetirizine (ZYRTEC) 10 MG tablet Take 10 mg by mouth at bedtime.    . clonazePAM (KLONOPIN) 0.5 MG tablet Take 0.5 mg by mouth at bedtime. For restless leg syndrome    . DOK 100 MG capsule Take 100 mg by mouth 2 (two) times daily.    Regino Schultze Bandages & Supports (MEDICAL COMPRESSION STOCKINGS) MISC 1 each by Does not apply route as directed. Knee Hi Compression Stockings to be measured prior to order-medium pressure Dx: Lower extremity edema & CHF  1 each 0  . ezetimibe (ZETIA) 10 MG tablet Take 10 mg by mouth daily.    . furosemide (LASIX) 40 MG tablet Take 40 mg by mouth daily.    Marland Kitchen HM CRANBERRY 500 MG TABS Take 1 tablet by mouth daily.    . isosorbide mononitrate (IMDUR) 30 MG 24 hr tablet Take 30 mg by mouth every morning.     Marland Kitchen levothyroxine (SYNTHROID) 100 MCG tablet Take 100 mcg by mouth daily before breakfast.    . levothyroxine (SYNTHROID) 112 MCG tablet Take 112 mcg by mouth daily.    . metoprolol tartrate (LOPRESSOR) 25 MG tablet Take 25 mg by mouth daily.    . pantoprazole (PROTONIX) 40 MG tablet Take 40 mg by mouth daily.    . polyethylene glycol powder (GLYCOLAX/MIRALAX) 17 GM/SCOOP powder SMARTSIG:1 Capful(s) By Mouth Daily    . Probiotic Product (PRO-BIOTIC BLEND PO) Take by mouth as needed.     . warfarin (COUMADIN) 2 MG tablet Take 2 mg by mouth. Take 2mg  every day except Monday; skip Mondays - or take as directed by MD     No current facility-administered medications for this visit.   Allergies:  Ciprofloxacin, Hctz [hydrochlorothiazide], and Lopid [gemfibrozil]   Social History: The patient  reports that she has never smoked. She has never used smokeless tobacco. She reports that she does not drink alcohol and does not use drugs.   Family History: The patient's family history includes Diabetes in her sister; Hypertension in her sister; Stroke in her brother and father.   ROS:  Please see the history of present illness. Otherwise, complete review of systems is positive for none.  All other systems are reviewed and negative.   Physical Exam: VS:  Ht 5\' 3"  (1.6 m)   Wt 150 lb (68 kg)   BMI 26.57 kg/m , BMI Body mass index is 26.57 kg/m.  Wt Readings from Last 3 Encounters:  06/16/20 150 lb (68 kg)  03/17/20 153 lb 5 oz (69.5 kg)  12/21/19 156 lb (70.8 kg)   Patient had a normal speech pattern and responded appropriately to questions.  No evidence of acute distress, wheezing, cough or dyspnea noted.   ECG:  No EKG today due to telemedicine visit  Recent Labwork: 03/17/2020: ALT 21; AST 26; BUN 24; Creatinine, Ser 1.34; Hemoglobin 11.4; Platelets 166; Potassium 4.4; Sodium 137  No results found for: CHOL, TRIG, HDL, CHOLHDL, VLDL, LDLCALC, LDLDIRECT  Other Studies Reviewed Today:   Assessment and Plan:  1. CAD in native artery   2. Essential hypertension   3. Mixed hyperlipidemia   4. PAF (paroxysmal atrial fibrillation) (HCC)   5. Cardiac pacemaker in situ   6. Lower extremity edema    1. CAD in native artery Denies any current anginal or exertional symptoms.  Continue Imdur 30 mg daily.  2. Essential hypertension No recordable blood pressure today.  Patient  is in a skilled nursing facility.  Continue amlodipine 10 mg daily, metoprolol 25 mg p.o. twice daily.  3. Mixed hyperlipidemia Continue Zetia 10 mg daily.  Patient had recent labs at PCP.  We will attempt to obtain those results.  4. PAF (paroxysmal atrial fibrillation) (HCC) Continue amiodarone 100 mg daily.  Coumadin 2 mg every day except Mondays.  Skip Mondays.  5. Cardiac pacemaker in situ Pacemaker was functioning normal on November 30, 2019 with an clinic device check with Dr. Rayann Heman.  6. Lower extremity edema Patient states her lower extremity edema has improved since she is usually lying flat in bed due to significant back pain and limited mobility.  She has an L2 compression fracture which causes her significant pain.  States she is only taken Tylenol for pain.  Medication Adjustments/Labs and Tests Ordered: Current medicines are reviewed at length with the patient today.  Concerns regarding medicines are outlined above.   Time spent with patient  15  minutes during telemedicine visit.    Disposition: Follow-up with Dr. Domenic Polite or APP 6 months  Signed, Levell July, NP 06/16/2020 11:44 AM    [facility] [place], [place],  [address] Phone: [phone]; Fax: [phone]

## 2020-06-16 ENCOUNTER — Telehealth (INDEPENDENT_AMBULATORY_CARE_PROVIDER_SITE_OTHER): Payer: Medicare Other | Admitting: Family Medicine

## 2020-06-16 ENCOUNTER — Telehealth: Payer: Medicare Other | Admitting: Cardiovascular Disease

## 2020-06-16 ENCOUNTER — Encounter: Payer: Self-pay | Admitting: Family Medicine

## 2020-06-16 VITALS — Ht 63.0 in | Wt 150.0 lb

## 2020-06-16 DIAGNOSIS — I480 Paroxysmal atrial fibrillation: Secondary | ICD-10-CM

## 2020-06-16 DIAGNOSIS — I10 Essential (primary) hypertension: Secondary | ICD-10-CM | POA: Diagnosis not present

## 2020-06-16 DIAGNOSIS — E782 Mixed hyperlipidemia: Secondary | ICD-10-CM

## 2020-06-16 DIAGNOSIS — Z950 Presence of cardiac pacemaker: Secondary | ICD-10-CM

## 2020-06-16 DIAGNOSIS — I2510 Atherosclerotic heart disease of native coronary artery without angina pectoris: Secondary | ICD-10-CM

## 2020-06-16 DIAGNOSIS — R600 Localized edema: Secondary | ICD-10-CM

## 2020-06-16 NOTE — Patient Instructions (Signed)

## 2020-06-17 DIAGNOSIS — I480 Paroxysmal atrial fibrillation: Secondary | ICD-10-CM | POA: Diagnosis not present

## 2020-06-17 DIAGNOSIS — U071 COVID-19: Secondary | ICD-10-CM | POA: Diagnosis not present

## 2020-06-17 DIAGNOSIS — D649 Anemia, unspecified: Secondary | ICD-10-CM | POA: Diagnosis not present

## 2020-06-17 DIAGNOSIS — Z950 Presence of cardiac pacemaker: Secondary | ICD-10-CM | POA: Diagnosis not present

## 2020-06-17 DIAGNOSIS — E039 Hypothyroidism, unspecified: Secondary | ICD-10-CM | POA: Diagnosis not present

## 2020-06-17 DIAGNOSIS — I119 Hypertensive heart disease without heart failure: Secondary | ICD-10-CM | POA: Diagnosis not present

## 2020-06-17 DIAGNOSIS — I2510 Atherosclerotic heart disease of native coronary artery without angina pectoris: Secondary | ICD-10-CM | POA: Diagnosis not present

## 2020-06-17 DIAGNOSIS — E785 Hyperlipidemia, unspecified: Secondary | ICD-10-CM | POA: Diagnosis not present

## 2020-06-17 DIAGNOSIS — Z87440 Personal history of urinary (tract) infections: Secondary | ICD-10-CM | POA: Diagnosis not present

## 2020-06-18 DIAGNOSIS — I2510 Atherosclerotic heart disease of native coronary artery without angina pectoris: Secondary | ICD-10-CM | POA: Diagnosis not present

## 2020-06-18 DIAGNOSIS — I119 Hypertensive heart disease without heart failure: Secondary | ICD-10-CM | POA: Diagnosis not present

## 2020-06-18 DIAGNOSIS — Z87440 Personal history of urinary (tract) infections: Secondary | ICD-10-CM | POA: Diagnosis not present

## 2020-06-18 DIAGNOSIS — E785 Hyperlipidemia, unspecified: Secondary | ICD-10-CM | POA: Diagnosis not present

## 2020-06-18 DIAGNOSIS — E039 Hypothyroidism, unspecified: Secondary | ICD-10-CM | POA: Diagnosis not present

## 2020-06-18 DIAGNOSIS — D649 Anemia, unspecified: Secondary | ICD-10-CM | POA: Diagnosis not present

## 2020-06-18 DIAGNOSIS — Z950 Presence of cardiac pacemaker: Secondary | ICD-10-CM | POA: Diagnosis not present

## 2020-06-18 DIAGNOSIS — I480 Paroxysmal atrial fibrillation: Secondary | ICD-10-CM | POA: Diagnosis not present

## 2020-06-21 DIAGNOSIS — W19XXXA Unspecified fall, initial encounter: Secondary | ICD-10-CM | POA: Diagnosis not present

## 2020-06-21 DIAGNOSIS — M545 Low back pain: Secondary | ICD-10-CM | POA: Diagnosis not present

## 2020-06-21 DIAGNOSIS — R300 Dysuria: Secondary | ICD-10-CM | POA: Diagnosis not present

## 2020-06-26 DIAGNOSIS — E039 Hypothyroidism, unspecified: Secondary | ICD-10-CM | POA: Diagnosis not present

## 2020-06-26 DIAGNOSIS — E7849 Other hyperlipidemia: Secondary | ICD-10-CM | POA: Diagnosis not present

## 2020-06-26 DIAGNOSIS — I10 Essential (primary) hypertension: Secondary | ICD-10-CM | POA: Diagnosis not present

## 2020-06-27 DIAGNOSIS — D649 Anemia, unspecified: Secondary | ICD-10-CM | POA: Diagnosis not present

## 2020-06-27 DIAGNOSIS — I2510 Atherosclerotic heart disease of native coronary artery without angina pectoris: Secondary | ICD-10-CM | POA: Diagnosis not present

## 2020-06-27 DIAGNOSIS — Z950 Presence of cardiac pacemaker: Secondary | ICD-10-CM | POA: Diagnosis not present

## 2020-06-27 DIAGNOSIS — E785 Hyperlipidemia, unspecified: Secondary | ICD-10-CM | POA: Diagnosis not present

## 2020-06-27 DIAGNOSIS — I480 Paroxysmal atrial fibrillation: Secondary | ICD-10-CM | POA: Diagnosis not present

## 2020-06-27 DIAGNOSIS — I119 Hypertensive heart disease without heart failure: Secondary | ICD-10-CM | POA: Diagnosis not present

## 2020-06-27 DIAGNOSIS — Z87440 Personal history of urinary (tract) infections: Secondary | ICD-10-CM | POA: Diagnosis not present

## 2020-06-27 DIAGNOSIS — E039 Hypothyroidism, unspecified: Secondary | ICD-10-CM | POA: Diagnosis not present

## 2020-07-03 DIAGNOSIS — I2510 Atherosclerotic heart disease of native coronary artery without angina pectoris: Secondary | ICD-10-CM | POA: Diagnosis not present

## 2020-07-03 DIAGNOSIS — E039 Hypothyroidism, unspecified: Secondary | ICD-10-CM | POA: Diagnosis not present

## 2020-07-03 DIAGNOSIS — I119 Hypertensive heart disease without heart failure: Secondary | ICD-10-CM | POA: Diagnosis not present

## 2020-07-03 DIAGNOSIS — E785 Hyperlipidemia, unspecified: Secondary | ICD-10-CM | POA: Diagnosis not present

## 2020-07-03 DIAGNOSIS — D649 Anemia, unspecified: Secondary | ICD-10-CM | POA: Diagnosis not present

## 2020-07-03 DIAGNOSIS — Z950 Presence of cardiac pacemaker: Secondary | ICD-10-CM | POA: Diagnosis not present

## 2020-07-03 DIAGNOSIS — Z87440 Personal history of urinary (tract) infections: Secondary | ICD-10-CM | POA: Diagnosis not present

## 2020-07-03 DIAGNOSIS — I480 Paroxysmal atrial fibrillation: Secondary | ICD-10-CM | POA: Diagnosis not present

## 2020-07-04 ENCOUNTER — Telehealth: Payer: Self-pay

## 2020-07-04 NOTE — Telephone Encounter (Signed)
Patient called in and states her monitor is not working and I gave her the number to tech support as she didn't want me to call them while on the phone she wanted to do it herself. Patient states she will call back and let us know what they say

## 2020-07-08 DIAGNOSIS — M545 Low back pain, unspecified: Secondary | ICD-10-CM | POA: Diagnosis not present

## 2020-07-08 DIAGNOSIS — R300 Dysuria: Secondary | ICD-10-CM | POA: Diagnosis not present

## 2020-07-09 DIAGNOSIS — D649 Anemia, unspecified: Secondary | ICD-10-CM | POA: Diagnosis not present

## 2020-07-09 DIAGNOSIS — I2510 Atherosclerotic heart disease of native coronary artery without angina pectoris: Secondary | ICD-10-CM | POA: Diagnosis not present

## 2020-07-09 DIAGNOSIS — E039 Hypothyroidism, unspecified: Secondary | ICD-10-CM | POA: Diagnosis not present

## 2020-07-09 DIAGNOSIS — E785 Hyperlipidemia, unspecified: Secondary | ICD-10-CM | POA: Diagnosis not present

## 2020-07-09 DIAGNOSIS — I119 Hypertensive heart disease without heart failure: Secondary | ICD-10-CM | POA: Diagnosis not present

## 2020-07-09 DIAGNOSIS — Z950 Presence of cardiac pacemaker: Secondary | ICD-10-CM | POA: Diagnosis not present

## 2020-07-09 DIAGNOSIS — Z87440 Personal history of urinary (tract) infections: Secondary | ICD-10-CM | POA: Diagnosis not present

## 2020-07-09 DIAGNOSIS — I480 Paroxysmal atrial fibrillation: Secondary | ICD-10-CM | POA: Diagnosis not present

## 2020-07-11 NOTE — Telephone Encounter (Signed)
Following up about home monitor.   No answer, LMOVM.

## 2020-07-14 DIAGNOSIS — I119 Hypertensive heart disease without heart failure: Secondary | ICD-10-CM | POA: Diagnosis not present

## 2020-07-14 DIAGNOSIS — E039 Hypothyroidism, unspecified: Secondary | ICD-10-CM | POA: Diagnosis not present

## 2020-07-14 DIAGNOSIS — I480 Paroxysmal atrial fibrillation: Secondary | ICD-10-CM | POA: Diagnosis not present

## 2020-07-14 DIAGNOSIS — D649 Anemia, unspecified: Secondary | ICD-10-CM | POA: Diagnosis not present

## 2020-07-14 DIAGNOSIS — Z87440 Personal history of urinary (tract) infections: Secondary | ICD-10-CM | POA: Diagnosis not present

## 2020-07-14 DIAGNOSIS — I2510 Atherosclerotic heart disease of native coronary artery without angina pectoris: Secondary | ICD-10-CM | POA: Diagnosis not present

## 2020-07-14 DIAGNOSIS — Z950 Presence of cardiac pacemaker: Secondary | ICD-10-CM | POA: Diagnosis not present

## 2020-07-14 DIAGNOSIS — E785 Hyperlipidemia, unspecified: Secondary | ICD-10-CM | POA: Diagnosis not present

## 2020-07-16 NOTE — Telephone Encounter (Signed)
LMOM that following up on monitor. Requested call to device clinic , # and office hours provided.

## 2020-07-17 DIAGNOSIS — U071 COVID-19: Secondary | ICD-10-CM | POA: Diagnosis not present

## 2020-07-17 NOTE — Telephone Encounter (Signed)
The pt did receive her new handheld for her monitor. I tried to help her and called [employer] tech support. We tried to help the pt for 30 minutes. I told the pt to let the monitor stay plugged in and let the handheld charge for 24 hours. I told her someone will call her tomorrow to help her send the transmission. The pt verbalized understanding. I assured her that the handheld will charge without the lights being on.

## 2020-07-22 ENCOUNTER — Ambulatory Visit (INDEPENDENT_AMBULATORY_CARE_PROVIDER_SITE_OTHER): Payer: Medicare Other

## 2020-07-22 DIAGNOSIS — I480 Paroxysmal atrial fibrillation: Secondary | ICD-10-CM | POA: Diagnosis not present

## 2020-07-22 LAB — CUP PACEART REMOTE DEVICE CHECK
Battery Impedance: 553 Ohm
Battery Remaining Longevity: 87 mo
Battery Voltage: 2.79 V
Brady Statistic AP VP Percent: 0 %
Brady Statistic AP VS Percent: 84 %
Brady Statistic AS VP Percent: 0 %
Brady Statistic AS VS Percent: 15 %
Date Time Interrogation Session: 20211026101716
Implantable Lead Implant Date: 20040827
Implantable Lead Implant Date: 20040827
Implantable Lead Location: 753859
Implantable Lead Location: 753860
Implantable Lead Model: 4092
Implantable Pulse Generator Implant Date: 20140519
Lead Channel Impedance Value: 482 Ohm
Lead Channel Impedance Value: 969 Ohm
Lead Channel Pacing Threshold Amplitude: 0.5 V
Lead Channel Pacing Threshold Amplitude: 1 V
Lead Channel Pacing Threshold Pulse Width: 0.4 ms
Lead Channel Pacing Threshold Pulse Width: 0.4 ms
Lead Channel Setting Pacing Amplitude: 2 V
Lead Channel Setting Pacing Amplitude: 2.5 V
Lead Channel Setting Pacing Pulse Width: 0.4 ms
Lead Channel Setting Sensing Sensitivity: 2.8 mV

## 2020-07-22 NOTE — Telephone Encounter (Signed)
I helped the pt send the transmission. Transmission received. I added her to the schedule.

## 2020-07-24 NOTE — Progress Notes (Signed)
Remote pacemaker transmission.   

## 2020-07-25 DIAGNOSIS — Z87440 Personal history of urinary (tract) infections: Secondary | ICD-10-CM | POA: Diagnosis not present

## 2020-07-25 DIAGNOSIS — E785 Hyperlipidemia, unspecified: Secondary | ICD-10-CM | POA: Diagnosis not present

## 2020-07-25 DIAGNOSIS — Z950 Presence of cardiac pacemaker: Secondary | ICD-10-CM | POA: Diagnosis not present

## 2020-07-25 DIAGNOSIS — D649 Anemia, unspecified: Secondary | ICD-10-CM | POA: Diagnosis not present

## 2020-07-25 DIAGNOSIS — E039 Hypothyroidism, unspecified: Secondary | ICD-10-CM | POA: Diagnosis not present

## 2020-07-25 DIAGNOSIS — I480 Paroxysmal atrial fibrillation: Secondary | ICD-10-CM | POA: Diagnosis not present

## 2020-07-25 DIAGNOSIS — I119 Hypertensive heart disease without heart failure: Secondary | ICD-10-CM | POA: Diagnosis not present

## 2020-07-25 DIAGNOSIS — I2510 Atherosclerotic heart disease of native coronary artery without angina pectoris: Secondary | ICD-10-CM | POA: Diagnosis not present

## 2020-07-26 DIAGNOSIS — E7849 Other hyperlipidemia: Secondary | ICD-10-CM | POA: Diagnosis not present

## 2020-07-26 DIAGNOSIS — E039 Hypothyroidism, unspecified: Secondary | ICD-10-CM | POA: Diagnosis not present

## 2020-07-26 DIAGNOSIS — I10 Essential (primary) hypertension: Secondary | ICD-10-CM | POA: Diagnosis not present

## 2020-08-07 DIAGNOSIS — K5900 Constipation, unspecified: Secondary | ICD-10-CM | POA: Diagnosis not present

## 2020-08-07 DIAGNOSIS — R300 Dysuria: Secondary | ICD-10-CM | POA: Diagnosis not present

## 2020-08-17 DIAGNOSIS — U071 COVID-19: Secondary | ICD-10-CM | POA: Diagnosis not present

## 2020-08-26 DIAGNOSIS — I482 Chronic atrial fibrillation, unspecified: Secondary | ICD-10-CM | POA: Diagnosis not present

## 2020-08-26 DIAGNOSIS — I10 Essential (primary) hypertension: Secondary | ICD-10-CM | POA: Diagnosis not present

## 2020-08-26 DIAGNOSIS — E7849 Other hyperlipidemia: Secondary | ICD-10-CM | POA: Diagnosis not present

## 2020-08-26 DIAGNOSIS — E039 Hypothyroidism, unspecified: Secondary | ICD-10-CM | POA: Diagnosis not present

## 2020-09-09 DIAGNOSIS — M79671 Pain in right foot: Secondary | ICD-10-CM | POA: Diagnosis not present

## 2020-09-09 DIAGNOSIS — M7989 Other specified soft tissue disorders: Secondary | ICD-10-CM | POA: Diagnosis not present

## 2020-09-16 DIAGNOSIS — U071 COVID-19: Secondary | ICD-10-CM | POA: Diagnosis not present

## 2020-09-26 DIAGNOSIS — E7849 Other hyperlipidemia: Secondary | ICD-10-CM | POA: Diagnosis not present

## 2020-09-26 DIAGNOSIS — I482 Chronic atrial fibrillation, unspecified: Secondary | ICD-10-CM | POA: Diagnosis not present

## 2020-09-26 DIAGNOSIS — I10 Essential (primary) hypertension: Secondary | ICD-10-CM | POA: Diagnosis not present

## 2020-09-26 DIAGNOSIS — E039 Hypothyroidism, unspecified: Secondary | ICD-10-CM | POA: Diagnosis not present

## 2020-10-15 DIAGNOSIS — N3090 Cystitis, unspecified without hematuria: Secondary | ICD-10-CM | POA: Diagnosis not present

## 2020-10-17 DIAGNOSIS — U071 COVID-19: Secondary | ICD-10-CM | POA: Diagnosis not present

## 2020-10-18 DIAGNOSIS — K219 Gastro-esophageal reflux disease without esophagitis: Secondary | ICD-10-CM | POA: Diagnosis not present

## 2020-10-18 DIAGNOSIS — N390 Urinary tract infection, site not specified: Secondary | ICD-10-CM | POA: Diagnosis not present

## 2020-10-18 DIAGNOSIS — I10 Essential (primary) hypertension: Secondary | ICD-10-CM | POA: Diagnosis not present

## 2020-10-18 DIAGNOSIS — K7460 Unspecified cirrhosis of liver: Secondary | ICD-10-CM | POA: Diagnosis not present

## 2020-10-18 DIAGNOSIS — E039 Hypothyroidism, unspecified: Secondary | ICD-10-CM | POA: Diagnosis not present

## 2020-10-18 DIAGNOSIS — I700 Atherosclerosis of aorta: Secondary | ICD-10-CM | POA: Diagnosis not present

## 2020-10-18 DIAGNOSIS — K769 Liver disease, unspecified: Secondary | ICD-10-CM | POA: Diagnosis not present

## 2020-10-18 DIAGNOSIS — S43101A Unspecified dislocation of right acromioclavicular joint, initial encounter: Secondary | ICD-10-CM | POA: Diagnosis not present

## 2020-10-18 DIAGNOSIS — K5730 Diverticulosis of large intestine without perforation or abscess without bleeding: Secondary | ICD-10-CM | POA: Diagnosis not present

## 2020-10-18 DIAGNOSIS — R9431 Abnormal electrocardiogram [ECG] [EKG]: Secondary | ICD-10-CM | POA: Diagnosis not present

## 2020-10-18 DIAGNOSIS — R109 Unspecified abdominal pain: Secondary | ICD-10-CM | POA: Diagnosis not present

## 2020-10-18 DIAGNOSIS — R5381 Other malaise: Secondary | ICD-10-CM | POA: Diagnosis not present

## 2020-10-18 DIAGNOSIS — J439 Emphysema, unspecified: Secondary | ICD-10-CM | POA: Diagnosis not present

## 2020-10-18 DIAGNOSIS — N1832 Chronic kidney disease, stage 3b: Secondary | ICD-10-CM | POA: Diagnosis not present

## 2020-10-18 DIAGNOSIS — R1031 Right lower quadrant pain: Secondary | ICD-10-CM | POA: Diagnosis not present

## 2020-10-18 DIAGNOSIS — G8929 Other chronic pain: Secondary | ICD-10-CM | POA: Diagnosis not present

## 2020-10-18 DIAGNOSIS — I4891 Unspecified atrial fibrillation: Secondary | ICD-10-CM | POA: Diagnosis not present

## 2020-10-18 DIAGNOSIS — Z743 Need for continuous supervision: Secondary | ICD-10-CM | POA: Diagnosis not present

## 2020-10-18 DIAGNOSIS — K8020 Calculus of gallbladder without cholecystitis without obstruction: Secondary | ICD-10-CM | POA: Diagnosis not present

## 2020-10-18 DIAGNOSIS — I517 Cardiomegaly: Secondary | ICD-10-CM | POA: Diagnosis not present

## 2020-10-18 DIAGNOSIS — S299XXA Unspecified injury of thorax, initial encounter: Secondary | ICD-10-CM | POA: Diagnosis not present

## 2020-10-19 DIAGNOSIS — S299XXA Unspecified injury of thorax, initial encounter: Secondary | ICD-10-CM | POA: Diagnosis not present

## 2020-10-19 DIAGNOSIS — S43101A Unspecified dislocation of right acromioclavicular joint, initial encounter: Secondary | ICD-10-CM | POA: Diagnosis not present

## 2020-10-19 DIAGNOSIS — I700 Atherosclerosis of aorta: Secondary | ICD-10-CM | POA: Diagnosis not present

## 2020-10-19 DIAGNOSIS — I517 Cardiomegaly: Secondary | ICD-10-CM | POA: Diagnosis not present

## 2020-10-19 DIAGNOSIS — R109 Unspecified abdominal pain: Secondary | ICD-10-CM | POA: Diagnosis not present

## 2020-10-20 DIAGNOSIS — M6281 Muscle weakness (generalized): Secondary | ICD-10-CM | POA: Diagnosis not present

## 2020-10-20 DIAGNOSIS — G894 Chronic pain syndrome: Secondary | ICD-10-CM | POA: Diagnosis not present

## 2020-10-21 ENCOUNTER — Ambulatory Visit (INDEPENDENT_AMBULATORY_CARE_PROVIDER_SITE_OTHER): Payer: Medicare Other

## 2020-10-21 DIAGNOSIS — I480 Paroxysmal atrial fibrillation: Secondary | ICD-10-CM | POA: Diagnosis not present

## 2020-10-21 LAB — CUP PACEART REMOTE DEVICE CHECK
Battery Impedance: 603 Ohm
Battery Remaining Longevity: 83 mo
Battery Voltage: 2.8 V
Brady Statistic AP VP Percent: 0 %
Brady Statistic AP VS Percent: 82 %
Brady Statistic AS VP Percent: 2 %
Brady Statistic AS VS Percent: 16 %
Date Time Interrogation Session: 20220125153619
Implantable Lead Implant Date: 20040827
Implantable Lead Implant Date: 20040827
Implantable Lead Location: 753859
Implantable Lead Location: 753860
Implantable Lead Model: 4092
Implantable Pulse Generator Implant Date: 20140519
Lead Channel Impedance Value: 488 Ohm
Lead Channel Impedance Value: 907 Ohm
Lead Channel Pacing Threshold Amplitude: 0.375 V
Lead Channel Pacing Threshold Amplitude: 1 V
Lead Channel Pacing Threshold Pulse Width: 0.4 ms
Lead Channel Pacing Threshold Pulse Width: 0.4 ms
Lead Channel Setting Pacing Amplitude: 2 V
Lead Channel Setting Pacing Amplitude: 2.5 V
Lead Channel Setting Pacing Pulse Width: 0.4 ms
Lead Channel Setting Sensing Sensitivity: 4 mV

## 2020-10-22 DIAGNOSIS — R109 Unspecified abdominal pain: Secondary | ICD-10-CM | POA: Diagnosis not present

## 2020-10-22 DIAGNOSIS — L853 Xerosis cutis: Secondary | ICD-10-CM | POA: Diagnosis not present

## 2020-10-25 DIAGNOSIS — I10 Essential (primary) hypertension: Secondary | ICD-10-CM | POA: Diagnosis not present

## 2020-10-25 DIAGNOSIS — I482 Chronic atrial fibrillation, unspecified: Secondary | ICD-10-CM | POA: Diagnosis not present

## 2020-10-25 DIAGNOSIS — E7849 Other hyperlipidemia: Secondary | ICD-10-CM | POA: Diagnosis not present

## 2020-10-25 DIAGNOSIS — E039 Hypothyroidism, unspecified: Secondary | ICD-10-CM | POA: Diagnosis not present

## 2020-10-31 NOTE — Progress Notes (Signed)
Remote pacemaker transmission.   

## 2020-11-06 DIAGNOSIS — R5383 Other fatigue: Secondary | ICD-10-CM | POA: Diagnosis not present

## 2020-11-06 DIAGNOSIS — E039 Hypothyroidism, unspecified: Secondary | ICD-10-CM | POA: Diagnosis not present

## 2020-11-06 DIAGNOSIS — L03019 Cellulitis of unspecified finger: Secondary | ICD-10-CM | POA: Diagnosis not present

## 2020-11-17 DIAGNOSIS — U071 COVID-19: Secondary | ICD-10-CM | POA: Diagnosis not present

## 2020-11-17 DIAGNOSIS — G894 Chronic pain syndrome: Secondary | ICD-10-CM | POA: Diagnosis not present

## 2020-11-17 DIAGNOSIS — M6281 Muscle weakness (generalized): Secondary | ICD-10-CM | POA: Diagnosis not present

## 2020-11-18 DIAGNOSIS — R1310 Dysphagia, unspecified: Secondary | ICD-10-CM | POA: Diagnosis not present

## 2020-11-18 DIAGNOSIS — K1370 Unspecified lesions of oral mucosa: Secondary | ICD-10-CM | POA: Diagnosis not present

## 2020-11-18 DIAGNOSIS — R5383 Other fatigue: Secondary | ICD-10-CM | POA: Diagnosis not present

## 2020-11-18 DIAGNOSIS — L03019 Cellulitis of unspecified finger: Secondary | ICD-10-CM | POA: Diagnosis not present

## 2020-11-25 DIAGNOSIS — M79675 Pain in left toe(s): Secondary | ICD-10-CM | POA: Diagnosis not present

## 2020-11-25 DIAGNOSIS — M79674 Pain in right toe(s): Secondary | ICD-10-CM | POA: Diagnosis not present

## 2020-11-25 DIAGNOSIS — B351 Tinea unguium: Secondary | ICD-10-CM | POA: Diagnosis not present

## 2020-12-12 DIAGNOSIS — M79671 Pain in right foot: Secondary | ICD-10-CM | POA: Diagnosis not present

## 2020-12-15 DIAGNOSIS — U071 COVID-19: Secondary | ICD-10-CM | POA: Diagnosis not present

## 2020-12-18 DIAGNOSIS — M6281 Muscle weakness (generalized): Secondary | ICD-10-CM | POA: Diagnosis not present

## 2020-12-18 DIAGNOSIS — G894 Chronic pain syndrome: Secondary | ICD-10-CM | POA: Diagnosis not present

## 2020-12-24 DIAGNOSIS — E039 Hypothyroidism, unspecified: Secondary | ICD-10-CM | POA: Diagnosis not present

## 2020-12-24 DIAGNOSIS — E7849 Other hyperlipidemia: Secondary | ICD-10-CM | POA: Diagnosis not present

## 2020-12-24 DIAGNOSIS — I482 Chronic atrial fibrillation, unspecified: Secondary | ICD-10-CM | POA: Diagnosis not present

## 2020-12-24 DIAGNOSIS — I10 Essential (primary) hypertension: Secondary | ICD-10-CM | POA: Diagnosis not present

## 2021-01-02 DIAGNOSIS — L989 Disorder of the skin and subcutaneous tissue, unspecified: Secondary | ICD-10-CM | POA: Diagnosis not present

## 2021-01-02 DIAGNOSIS — R609 Edema, unspecified: Secondary | ICD-10-CM | POA: Diagnosis not present

## 2021-01-02 DIAGNOSIS — R300 Dysuria: Secondary | ICD-10-CM | POA: Diagnosis not present

## 2021-01-15 DIAGNOSIS — U071 COVID-19: Secondary | ICD-10-CM | POA: Diagnosis not present

## 2021-01-16 DIAGNOSIS — G894 Chronic pain syndrome: Secondary | ICD-10-CM | POA: Diagnosis not present

## 2021-01-16 DIAGNOSIS — M6281 Muscle weakness (generalized): Secondary | ICD-10-CM | POA: Diagnosis not present

## 2021-01-19 DIAGNOSIS — K644 Residual hemorrhoidal skin tags: Secondary | ICD-10-CM | POA: Diagnosis not present

## 2021-01-19 DIAGNOSIS — K5900 Constipation, unspecified: Secondary | ICD-10-CM | POA: Diagnosis not present

## 2021-01-19 DIAGNOSIS — M545 Low back pain, unspecified: Secondary | ICD-10-CM | POA: Diagnosis not present

## 2021-01-19 DIAGNOSIS — R300 Dysuria: Secondary | ICD-10-CM | POA: Diagnosis not present

## 2021-01-20 ENCOUNTER — Ambulatory Visit (INDEPENDENT_AMBULATORY_CARE_PROVIDER_SITE_OTHER): Payer: Medicare Other

## 2021-01-20 DIAGNOSIS — I480 Paroxysmal atrial fibrillation: Secondary | ICD-10-CM | POA: Diagnosis not present

## 2021-01-21 LAB — CUP PACEART REMOTE DEVICE CHECK
Battery Impedance: 679 Ohm
Battery Remaining Longevity: 78 mo
Battery Voltage: 2.8 V
Brady Statistic AP VP Percent: 1 %
Brady Statistic AP VS Percent: 81 %
Brady Statistic AS VP Percent: 3 %
Brady Statistic AS VS Percent: 16 %
Date Time Interrogation Session: 20220427114747
Implantable Lead Implant Date: 20040827
Implantable Lead Implant Date: 20040827
Implantable Lead Location: 753859
Implantable Lead Location: 753860
Implantable Lead Model: 4092
Implantable Pulse Generator Implant Date: 20140519
Lead Channel Impedance Value: 475 Ohm
Lead Channel Impedance Value: 801 Ohm
Lead Channel Pacing Threshold Amplitude: 0.625 V
Lead Channel Pacing Threshold Amplitude: 1.5 V
Lead Channel Pacing Threshold Pulse Width: 0.4 ms
Lead Channel Pacing Threshold Pulse Width: 0.4 ms
Lead Channel Setting Pacing Amplitude: 2 V
Lead Channel Setting Pacing Amplitude: 2.5 V
Lead Channel Setting Pacing Pulse Width: 0.4 ms
Lead Channel Setting Sensing Sensitivity: 4 mV

## 2021-01-24 DIAGNOSIS — I10 Essential (primary) hypertension: Secondary | ICD-10-CM | POA: Diagnosis not present

## 2021-01-24 DIAGNOSIS — E039 Hypothyroidism, unspecified: Secondary | ICD-10-CM | POA: Diagnosis not present

## 2021-01-24 DIAGNOSIS — E7849 Other hyperlipidemia: Secondary | ICD-10-CM | POA: Diagnosis not present

## 2021-01-24 DIAGNOSIS — I482 Chronic atrial fibrillation, unspecified: Secondary | ICD-10-CM | POA: Diagnosis not present

## 2021-02-11 NOTE — Progress Notes (Signed)
Remote pacemaker transmission.   

## 2021-02-12 DIAGNOSIS — R300 Dysuria: Secondary | ICD-10-CM | POA: Diagnosis not present

## 2021-02-14 DIAGNOSIS — U071 COVID-19: Secondary | ICD-10-CM | POA: Diagnosis not present

## 2021-02-16 DIAGNOSIS — M6281 Muscle weakness (generalized): Secondary | ICD-10-CM | POA: Diagnosis not present

## 2021-02-16 DIAGNOSIS — G894 Chronic pain syndrome: Secondary | ICD-10-CM | POA: Diagnosis not present

## 2021-02-17 DIAGNOSIS — B351 Tinea unguium: Secondary | ICD-10-CM | POA: Diagnosis not present

## 2021-02-17 DIAGNOSIS — M79674 Pain in right toe(s): Secondary | ICD-10-CM | POA: Diagnosis not present

## 2021-02-17 DIAGNOSIS — M79675 Pain in left toe(s): Secondary | ICD-10-CM | POA: Diagnosis not present

## 2021-02-19 DIAGNOSIS — K649 Unspecified hemorrhoids: Secondary | ICD-10-CM | POA: Diagnosis not present

## 2021-02-19 DIAGNOSIS — S39012A Strain of muscle, fascia and tendon of lower back, initial encounter: Secondary | ICD-10-CM | POA: Diagnosis not present

## 2021-02-19 DIAGNOSIS — K5900 Constipation, unspecified: Secondary | ICD-10-CM | POA: Diagnosis not present

## 2021-02-19 DIAGNOSIS — R300 Dysuria: Secondary | ICD-10-CM | POA: Diagnosis not present

## 2021-02-23 DIAGNOSIS — E039 Hypothyroidism, unspecified: Secondary | ICD-10-CM | POA: Diagnosis not present

## 2021-02-23 DIAGNOSIS — I10 Essential (primary) hypertension: Secondary | ICD-10-CM | POA: Diagnosis not present

## 2021-02-23 DIAGNOSIS — E7849 Other hyperlipidemia: Secondary | ICD-10-CM | POA: Diagnosis not present

## 2021-02-23 DIAGNOSIS — I482 Chronic atrial fibrillation, unspecified: Secondary | ICD-10-CM | POA: Diagnosis not present

## 2021-03-04 DIAGNOSIS — R059 Cough, unspecified: Secondary | ICD-10-CM | POA: Diagnosis not present

## 2021-03-04 DIAGNOSIS — R300 Dysuria: Secondary | ICD-10-CM | POA: Diagnosis not present

## 2021-03-04 DIAGNOSIS — D485 Neoplasm of uncertain behavior of skin: Secondary | ICD-10-CM | POA: Diagnosis not present

## 2021-03-04 DIAGNOSIS — S39012A Strain of muscle, fascia and tendon of lower back, initial encounter: Secondary | ICD-10-CM | POA: Diagnosis not present

## 2021-03-04 DIAGNOSIS — K5900 Constipation, unspecified: Secondary | ICD-10-CM | POA: Diagnosis not present

## 2021-03-04 DIAGNOSIS — J069 Acute upper respiratory infection, unspecified: Secondary | ICD-10-CM | POA: Diagnosis not present

## 2021-03-17 DIAGNOSIS — U071 COVID-19: Secondary | ICD-10-CM | POA: Diagnosis not present

## 2021-03-19 DIAGNOSIS — M6281 Muscle weakness (generalized): Secondary | ICD-10-CM | POA: Diagnosis not present

## 2021-03-19 DIAGNOSIS — G894 Chronic pain syndrome: Secondary | ICD-10-CM | POA: Diagnosis not present

## 2021-04-16 DIAGNOSIS — U071 COVID-19: Secondary | ICD-10-CM | POA: Diagnosis not present

## 2021-04-21 ENCOUNTER — Ambulatory Visit (INDEPENDENT_AMBULATORY_CARE_PROVIDER_SITE_OTHER): Payer: Medicare Other

## 2021-04-21 DIAGNOSIS — I495 Sick sinus syndrome: Secondary | ICD-10-CM

## 2021-04-22 DIAGNOSIS — G894 Chronic pain syndrome: Secondary | ICD-10-CM | POA: Diagnosis not present

## 2021-04-22 DIAGNOSIS — M6281 Muscle weakness (generalized): Secondary | ICD-10-CM | POA: Diagnosis not present

## 2021-04-23 LAB — CUP PACEART REMOTE DEVICE CHECK
Battery Impedance: 730 Ohm
Battery Remaining Longevity: 76 mo
Battery Voltage: 2.79 V
Brady Statistic AP VP Percent: 1 %
Brady Statistic AP VS Percent: 83 %
Brady Statistic AS VP Percent: 3 %
Brady Statistic AS VS Percent: 14 %
Date Time Interrogation Session: 20220728100023
Implantable Lead Implant Date: 20040827
Implantable Lead Implant Date: 20040827
Implantable Lead Location: 753859
Implantable Lead Location: 753860
Implantable Lead Model: 4092
Implantable Pulse Generator Implant Date: 20140519
Lead Channel Impedance Value: 1026 Ohm
Lead Channel Impedance Value: 469 Ohm
Lead Channel Pacing Threshold Amplitude: 0.625 V
Lead Channel Pacing Threshold Amplitude: 1.125 V
Lead Channel Pacing Threshold Pulse Width: 0.4 ms
Lead Channel Pacing Threshold Pulse Width: 0.4 ms
Lead Channel Setting Pacing Amplitude: 2 V
Lead Channel Setting Pacing Amplitude: 2.5 V
Lead Channel Setting Pacing Pulse Width: 0.4 ms
Lead Channel Setting Sensing Sensitivity: 2.8 mV

## 2021-05-05 DIAGNOSIS — D485 Neoplasm of uncertain behavior of skin: Secondary | ICD-10-CM | POA: Diagnosis not present

## 2021-05-05 DIAGNOSIS — L821 Other seborrheic keratosis: Secondary | ICD-10-CM | POA: Diagnosis not present

## 2021-05-05 DIAGNOSIS — C44319 Basal cell carcinoma of skin of other parts of face: Secondary | ICD-10-CM | POA: Diagnosis not present

## 2021-05-05 DIAGNOSIS — D1801 Hemangioma of skin and subcutaneous tissue: Secondary | ICD-10-CM | POA: Diagnosis not present

## 2021-05-05 DIAGNOSIS — L905 Scar conditions and fibrosis of skin: Secondary | ICD-10-CM | POA: Diagnosis not present

## 2021-05-05 DIAGNOSIS — Z85828 Personal history of other malignant neoplasm of skin: Secondary | ICD-10-CM | POA: Diagnosis not present

## 2021-05-05 DIAGNOSIS — D692 Other nonthrombocytopenic purpura: Secondary | ICD-10-CM | POA: Diagnosis not present

## 2021-05-15 NOTE — Progress Notes (Signed)
Remote pacemaker transmission.   

## 2021-05-17 DIAGNOSIS — U071 COVID-19: Secondary | ICD-10-CM | POA: Diagnosis not present

## 2021-05-20 DIAGNOSIS — G894 Chronic pain syndrome: Secondary | ICD-10-CM | POA: Diagnosis not present

## 2021-05-20 DIAGNOSIS — M6281 Muscle weakness (generalized): Secondary | ICD-10-CM | POA: Diagnosis not present

## 2021-05-27 DIAGNOSIS — I482 Chronic atrial fibrillation, unspecified: Secondary | ICD-10-CM | POA: Diagnosis not present

## 2021-05-27 DIAGNOSIS — K219 Gastro-esophageal reflux disease without esophagitis: Secondary | ICD-10-CM | POA: Diagnosis not present

## 2021-05-27 DIAGNOSIS — N390 Urinary tract infection, site not specified: Secondary | ICD-10-CM | POA: Diagnosis not present

## 2021-05-27 DIAGNOSIS — D7389 Other diseases of spleen: Secondary | ICD-10-CM | POA: Diagnosis not present

## 2021-05-27 DIAGNOSIS — K649 Unspecified hemorrhoids: Secondary | ICD-10-CM | POA: Diagnosis not present

## 2021-05-27 DIAGNOSIS — E782 Mixed hyperlipidemia: Secondary | ICD-10-CM | POA: Diagnosis not present

## 2021-05-27 DIAGNOSIS — Z950 Presence of cardiac pacemaker: Secondary | ICD-10-CM | POA: Diagnosis not present

## 2021-05-27 DIAGNOSIS — K5900 Constipation, unspecified: Secondary | ICD-10-CM | POA: Diagnosis not present

## 2021-06-08 ENCOUNTER — Other Ambulatory Visit (HOSPITAL_COMMUNITY): Payer: Self-pay | Admitting: Physician Assistant

## 2021-06-08 DIAGNOSIS — H16223 Keratoconjunctivitis sicca, not specified as Sjogren's, bilateral: Secondary | ICD-10-CM | POA: Diagnosis not present

## 2021-06-08 DIAGNOSIS — Z961 Presence of intraocular lens: Secondary | ICD-10-CM | POA: Diagnosis not present

## 2021-06-08 DIAGNOSIS — H3562 Retinal hemorrhage, left eye: Secondary | ICD-10-CM | POA: Diagnosis not present

## 2021-06-08 DIAGNOSIS — H524 Presbyopia: Secondary | ICD-10-CM | POA: Diagnosis not present

## 2021-06-17 DIAGNOSIS — U071 COVID-19: Secondary | ICD-10-CM | POA: Diagnosis not present

## 2021-06-18 ENCOUNTER — Other Ambulatory Visit (HOSPITAL_COMMUNITY): Payer: Medicare Other

## 2021-06-22 DIAGNOSIS — G894 Chronic pain syndrome: Secondary | ICD-10-CM | POA: Diagnosis not present

## 2021-06-22 DIAGNOSIS — M6281 Muscle weakness (generalized): Secondary | ICD-10-CM | POA: Diagnosis not present

## 2021-06-23 DIAGNOSIS — D7389 Other diseases of spleen: Secondary | ICD-10-CM | POA: Diagnosis not present

## 2021-06-23 DIAGNOSIS — K429 Umbilical hernia without obstruction or gangrene: Secondary | ICD-10-CM | POA: Diagnosis not present

## 2021-06-23 DIAGNOSIS — K8020 Calculus of gallbladder without cholecystitis without obstruction: Secondary | ICD-10-CM | POA: Diagnosis not present

## 2021-06-23 DIAGNOSIS — K449 Diaphragmatic hernia without obstruction or gangrene: Secondary | ICD-10-CM | POA: Diagnosis not present

## 2021-06-27 DIAGNOSIS — E039 Hypothyroidism, unspecified: Secondary | ICD-10-CM | POA: Diagnosis not present

## 2021-06-27 DIAGNOSIS — Z79899 Other long term (current) drug therapy: Secondary | ICD-10-CM | POA: Diagnosis not present

## 2021-06-27 DIAGNOSIS — S29011A Strain of muscle and tendon of front wall of thorax, initial encounter: Secondary | ICD-10-CM | POA: Diagnosis not present

## 2021-06-27 DIAGNOSIS — R6889 Other general symptoms and signs: Secondary | ICD-10-CM | POA: Diagnosis not present

## 2021-06-27 DIAGNOSIS — I4891 Unspecified atrial fibrillation: Secondary | ICD-10-CM | POA: Diagnosis not present

## 2021-06-27 DIAGNOSIS — R0789 Other chest pain: Secondary | ICD-10-CM | POA: Diagnosis not present

## 2021-06-27 DIAGNOSIS — R9431 Abnormal electrocardiogram [ECG] [EKG]: Secondary | ICD-10-CM | POA: Diagnosis not present

## 2021-06-27 DIAGNOSIS — Z743 Need for continuous supervision: Secondary | ICD-10-CM | POA: Diagnosis not present

## 2021-06-27 DIAGNOSIS — Z881 Allergy status to other antibiotic agents status: Secondary | ICD-10-CM | POA: Diagnosis not present

## 2021-06-27 DIAGNOSIS — I10 Essential (primary) hypertension: Secondary | ICD-10-CM | POA: Diagnosis not present

## 2021-06-27 DIAGNOSIS — G894 Chronic pain syndrome: Secondary | ICD-10-CM | POA: Diagnosis not present

## 2021-06-27 DIAGNOSIS — K219 Gastro-esophageal reflux disease without esophagitis: Secondary | ICD-10-CM | POA: Diagnosis not present

## 2021-06-27 DIAGNOSIS — R079 Chest pain, unspecified: Secondary | ICD-10-CM | POA: Diagnosis not present

## 2021-06-27 DIAGNOSIS — E119 Type 2 diabetes mellitus without complications: Secondary | ICD-10-CM | POA: Diagnosis not present

## 2021-06-27 DIAGNOSIS — Z888 Allergy status to other drugs, medicaments and biological substances status: Secondary | ICD-10-CM | POA: Diagnosis not present

## 2021-06-27 DIAGNOSIS — X500XXA Overexertion from strenuous movement or load, initial encounter: Secondary | ICD-10-CM | POA: Diagnosis not present

## 2021-06-27 DIAGNOSIS — R0689 Other abnormalities of breathing: Secondary | ICD-10-CM | POA: Diagnosis not present

## 2021-06-29 DIAGNOSIS — D351 Benign neoplasm of parathyroid gland: Secondary | ICD-10-CM | POA: Diagnosis not present

## 2021-07-02 DIAGNOSIS — C44319 Basal cell carcinoma of skin of other parts of face: Secondary | ICD-10-CM | POA: Diagnosis not present

## 2021-07-02 DIAGNOSIS — D485 Neoplasm of uncertain behavior of skin: Secondary | ICD-10-CM | POA: Diagnosis not present

## 2021-07-16 DIAGNOSIS — C44319 Basal cell carcinoma of skin of other parts of face: Secondary | ICD-10-CM | POA: Diagnosis not present

## 2021-07-17 DIAGNOSIS — U071 COVID-19: Secondary | ICD-10-CM | POA: Diagnosis not present

## 2021-07-20 DIAGNOSIS — G894 Chronic pain syndrome: Secondary | ICD-10-CM | POA: Diagnosis not present

## 2021-07-20 DIAGNOSIS — M6281 Muscle weakness (generalized): Secondary | ICD-10-CM | POA: Diagnosis not present

## 2021-07-27 DIAGNOSIS — I482 Chronic atrial fibrillation, unspecified: Secondary | ICD-10-CM | POA: Diagnosis not present

## 2021-07-27 DIAGNOSIS — E039 Hypothyroidism, unspecified: Secondary | ICD-10-CM | POA: Diagnosis not present

## 2021-07-27 DIAGNOSIS — I10 Essential (primary) hypertension: Secondary | ICD-10-CM | POA: Diagnosis not present

## 2021-07-27 DIAGNOSIS — E7849 Other hyperlipidemia: Secondary | ICD-10-CM | POA: Diagnosis not present

## 2021-07-28 DIAGNOSIS — Z7689 Persons encountering health services in other specified circumstances: Secondary | ICD-10-CM | POA: Diagnosis not present

## 2021-08-04 DIAGNOSIS — M79674 Pain in right toe(s): Secondary | ICD-10-CM | POA: Diagnosis not present

## 2021-08-04 DIAGNOSIS — M79675 Pain in left toe(s): Secondary | ICD-10-CM | POA: Diagnosis not present

## 2021-08-04 DIAGNOSIS — B351 Tinea unguium: Secondary | ICD-10-CM | POA: Diagnosis not present

## 2021-08-12 DIAGNOSIS — R21 Rash and other nonspecific skin eruption: Secondary | ICD-10-CM | POA: Diagnosis not present

## 2021-08-17 DIAGNOSIS — M6281 Muscle weakness (generalized): Secondary | ICD-10-CM | POA: Diagnosis not present

## 2021-08-17 DIAGNOSIS — G894 Chronic pain syndrome: Secondary | ICD-10-CM | POA: Diagnosis not present

## 2021-08-17 DIAGNOSIS — U071 COVID-19: Secondary | ICD-10-CM | POA: Diagnosis not present

## 2021-08-26 DIAGNOSIS — I482 Chronic atrial fibrillation, unspecified: Secondary | ICD-10-CM | POA: Diagnosis not present

## 2021-08-26 DIAGNOSIS — E7849 Other hyperlipidemia: Secondary | ICD-10-CM | POA: Diagnosis not present

## 2021-08-26 DIAGNOSIS — E039 Hypothyroidism, unspecified: Secondary | ICD-10-CM | POA: Diagnosis not present

## 2021-08-26 DIAGNOSIS — I10 Essential (primary) hypertension: Secondary | ICD-10-CM | POA: Diagnosis not present

## 2021-08-28 DIAGNOSIS — R300 Dysuria: Secondary | ICD-10-CM | POA: Diagnosis not present

## 2021-09-16 DIAGNOSIS — U071 COVID-19: Secondary | ICD-10-CM | POA: Diagnosis not present

## 2021-09-17 DIAGNOSIS — M6281 Muscle weakness (generalized): Secondary | ICD-10-CM | POA: Diagnosis not present

## 2021-09-17 DIAGNOSIS — G894 Chronic pain syndrome: Secondary | ICD-10-CM | POA: Diagnosis not present

## 2021-10-17 DIAGNOSIS — U071 COVID-19: Secondary | ICD-10-CM | POA: Diagnosis not present

## 2021-10-20 ENCOUNTER — Ambulatory Visit (INDEPENDENT_AMBULATORY_CARE_PROVIDER_SITE_OTHER): Payer: Medicare Other

## 2021-10-20 DIAGNOSIS — I495 Sick sinus syndrome: Secondary | ICD-10-CM

## 2021-10-20 DIAGNOSIS — M6281 Muscle weakness (generalized): Secondary | ICD-10-CM | POA: Diagnosis not present

## 2021-10-20 DIAGNOSIS — G894 Chronic pain syndrome: Secondary | ICD-10-CM | POA: Diagnosis not present

## 2021-10-20 DIAGNOSIS — M79675 Pain in left toe(s): Secondary | ICD-10-CM | POA: Diagnosis not present

## 2021-10-20 DIAGNOSIS — B351 Tinea unguium: Secondary | ICD-10-CM | POA: Diagnosis not present

## 2021-10-20 DIAGNOSIS — M79674 Pain in right toe(s): Secondary | ICD-10-CM | POA: Diagnosis not present

## 2021-10-21 LAB — CUP PACEART REMOTE DEVICE CHECK
Battery Impedance: 859 Ohm
Battery Remaining Longevity: 68 mo
Battery Voltage: 2.79 V
Brady Statistic AP VP Percent: 1 %
Brady Statistic AP VS Percent: 86 %
Brady Statistic AS VP Percent: 2 %
Brady Statistic AS VS Percent: 11 %
Date Time Interrogation Session: 20230125105201
Implantable Lead Implant Date: 20040827
Implantable Lead Implant Date: 20040827
Implantable Lead Location: 753859
Implantable Lead Location: 753860
Implantable Lead Model: 4092
Implantable Pulse Generator Implant Date: 20140519
Lead Channel Impedance Value: 461 Ohm
Lead Channel Impedance Value: 767 Ohm
Lead Channel Pacing Threshold Amplitude: 0.625 V
Lead Channel Pacing Threshold Amplitude: 1 V
Lead Channel Pacing Threshold Pulse Width: 0.4 ms
Lead Channel Pacing Threshold Pulse Width: 0.4 ms
Lead Channel Setting Pacing Amplitude: 2 V
Lead Channel Setting Pacing Amplitude: 2.5 V
Lead Channel Setting Pacing Pulse Width: 0.4 ms
Lead Channel Setting Sensing Sensitivity: 4 mV

## 2021-10-22 DIAGNOSIS — R300 Dysuria: Secondary | ICD-10-CM | POA: Diagnosis not present

## 2021-10-22 LAB — CUP PACEART REMOTE DEVICE CHECK
Battery Impedance: 858 Ohm
Battery Remaining Longevity: 69 mo
Battery Voltage: 2.78 V
Brady Statistic AP VP Percent: 1 %
Brady Statistic AP VS Percent: 86 %
Brady Statistic AS VP Percent: 2 %
Brady Statistic AS VS Percent: 11 %
Date Time Interrogation Session: 20230126095433
Implantable Lead Implant Date: 20040827
Implantable Lead Implant Date: 20040827
Implantable Lead Location: 753859
Implantable Lead Location: 753860
Implantable Lead Model: 4092
Implantable Pulse Generator Implant Date: 20140519
Lead Channel Impedance Value: 1056 Ohm
Lead Channel Impedance Value: 474 Ohm
Lead Channel Pacing Threshold Amplitude: 0.625 V
Lead Channel Pacing Threshold Amplitude: 0.875 V
Lead Channel Pacing Threshold Pulse Width: 0.4 ms
Lead Channel Pacing Threshold Pulse Width: 0.4 ms
Lead Channel Setting Pacing Amplitude: 2 V
Lead Channel Setting Pacing Amplitude: 2.5 V
Lead Channel Setting Pacing Pulse Width: 0.4 ms
Lead Channel Setting Sensing Sensitivity: 4 mV

## 2021-10-28 ENCOUNTER — Other Ambulatory Visit (HOSPITAL_COMMUNITY): Payer: Self-pay | Admitting: Physician Assistant

## 2021-10-28 DIAGNOSIS — D7389 Other diseases of spleen: Secondary | ICD-10-CM

## 2021-10-30 NOTE — Progress Notes (Signed)
Remote pacemaker transmission.   

## 2021-11-17 DIAGNOSIS — U071 COVID-19: Secondary | ICD-10-CM | POA: Diagnosis not present

## 2021-11-18 DIAGNOSIS — G894 Chronic pain syndrome: Secondary | ICD-10-CM | POA: Diagnosis not present

## 2021-11-18 DIAGNOSIS — M6281 Muscle weakness (generalized): Secondary | ICD-10-CM | POA: Diagnosis not present

## 2021-11-24 DIAGNOSIS — I10 Essential (primary) hypertension: Secondary | ICD-10-CM | POA: Diagnosis not present

## 2021-11-24 DIAGNOSIS — E7849 Other hyperlipidemia: Secondary | ICD-10-CM | POA: Diagnosis not present

## 2021-12-16 DIAGNOSIS — M6281 Muscle weakness (generalized): Secondary | ICD-10-CM | POA: Diagnosis not present

## 2021-12-16 DIAGNOSIS — G894 Chronic pain syndrome: Secondary | ICD-10-CM | POA: Diagnosis not present

## 2022-01-15 DIAGNOSIS — U071 COVID-19: Secondary | ICD-10-CM | POA: Diagnosis not present

## 2022-01-18 DIAGNOSIS — G894 Chronic pain syndrome: Secondary | ICD-10-CM | POA: Diagnosis not present

## 2022-01-18 DIAGNOSIS — M6281 Muscle weakness (generalized): Secondary | ICD-10-CM | POA: Diagnosis not present

## 2022-01-19 ENCOUNTER — Ambulatory Visit (INDEPENDENT_AMBULATORY_CARE_PROVIDER_SITE_OTHER): Payer: Medicare Other

## 2022-01-19 DIAGNOSIS — I495 Sick sinus syndrome: Secondary | ICD-10-CM | POA: Diagnosis not present

## 2022-01-20 LAB — CUP PACEART REMOTE DEVICE CHECK
Battery Impedance: 962 Ohm
Battery Remaining Longevity: 64 mo
Battery Voltage: 2.78 V
Brady Statistic AP VP Percent: 1 %
Brady Statistic AP VS Percent: 87 %
Brady Statistic AS VP Percent: 2 %
Brady Statistic AS VS Percent: 11 %
Date Time Interrogation Session: 20230426102752
Implantable Lead Implant Date: 20040827
Implantable Lead Implant Date: 20040827
Implantable Lead Location: 753859
Implantable Lead Location: 753860
Implantable Lead Model: 4092
Implantable Pulse Generator Implant Date: 20140519
Lead Channel Impedance Value: 448 Ohm
Lead Channel Impedance Value: 885 Ohm
Lead Channel Pacing Threshold Amplitude: 0.75 V
Lead Channel Pacing Threshold Amplitude: 1.125 V
Lead Channel Pacing Threshold Pulse Width: 0.4 ms
Lead Channel Pacing Threshold Pulse Width: 0.4 ms
Lead Channel Setting Pacing Amplitude: 2 V
Lead Channel Setting Pacing Amplitude: 2.5 V
Lead Channel Setting Pacing Pulse Width: 0.4 ms
Lead Channel Setting Sensing Sensitivity: 4 mV

## 2022-02-03 NOTE — Progress Notes (Signed)
Remote pacemaker transmission.   

## 2022-02-16 DIAGNOSIS — G894 Chronic pain syndrome: Secondary | ICD-10-CM | POA: Diagnosis not present

## 2022-02-16 DIAGNOSIS — M6281 Muscle weakness (generalized): Secondary | ICD-10-CM | POA: Diagnosis not present

## 2022-02-24 ENCOUNTER — Telehealth: Payer: Self-pay | Admitting: Student

## 2022-02-24 DIAGNOSIS — T699XXA Effect of reduced temperature, unspecified, initial encounter: Secondary | ICD-10-CM | POA: Diagnosis not present

## 2022-02-24 DIAGNOSIS — G894 Chronic pain syndrome: Secondary | ICD-10-CM | POA: Diagnosis not present

## 2022-02-24 DIAGNOSIS — S43101A Unspecified dislocation of right acromioclavicular joint, initial encounter: Secondary | ICD-10-CM | POA: Diagnosis not present

## 2022-02-24 DIAGNOSIS — R072 Precordial pain: Secondary | ICD-10-CM | POA: Diagnosis not present

## 2022-02-24 DIAGNOSIS — Z79899 Other long term (current) drug therapy: Secondary | ICD-10-CM | POA: Diagnosis not present

## 2022-02-24 DIAGNOSIS — E161 Other hypoglycemia: Secondary | ICD-10-CM | POA: Diagnosis not present

## 2022-02-24 DIAGNOSIS — I4891 Unspecified atrial fibrillation: Secondary | ICD-10-CM | POA: Diagnosis not present

## 2022-02-24 DIAGNOSIS — Z8639 Personal history of other endocrine, nutritional and metabolic disease: Secondary | ICD-10-CM | POA: Diagnosis not present

## 2022-02-24 DIAGNOSIS — E7800 Pure hypercholesterolemia, unspecified: Secondary | ICD-10-CM | POA: Diagnosis not present

## 2022-02-24 DIAGNOSIS — R0789 Other chest pain: Secondary | ICD-10-CM | POA: Diagnosis not present

## 2022-02-24 DIAGNOSIS — I10 Essential (primary) hypertension: Secondary | ICD-10-CM | POA: Diagnosis not present

## 2022-02-24 DIAGNOSIS — U071 COVID-19: Secondary | ICD-10-CM | POA: Diagnosis not present

## 2022-02-24 DIAGNOSIS — K219 Gastro-esophageal reflux disease without esophagitis: Secondary | ICD-10-CM | POA: Diagnosis not present

## 2022-02-24 DIAGNOSIS — K7460 Unspecified cirrhosis of liver: Secondary | ICD-10-CM | POA: Diagnosis not present

## 2022-02-24 DIAGNOSIS — M419 Scoliosis, unspecified: Secondary | ICD-10-CM | POA: Diagnosis not present

## 2022-02-24 DIAGNOSIS — A419 Sepsis, unspecified organism: Secondary | ICD-10-CM | POA: Diagnosis not present

## 2022-02-24 DIAGNOSIS — E039 Hypothyroidism, unspecified: Secondary | ICD-10-CM | POA: Diagnosis not present

## 2022-02-24 DIAGNOSIS — E162 Hypoglycemia, unspecified: Secondary | ICD-10-CM | POA: Diagnosis not present

## 2022-02-24 DIAGNOSIS — R109 Unspecified abdominal pain: Secondary | ICD-10-CM | POA: Diagnosis not present

## 2022-02-24 DIAGNOSIS — J811 Chronic pulmonary edema: Secondary | ICD-10-CM | POA: Diagnosis not present

## 2022-02-24 DIAGNOSIS — E1169 Type 2 diabetes mellitus with other specified complication: Secondary | ICD-10-CM | POA: Diagnosis not present

## 2022-02-24 DIAGNOSIS — Z7984 Long term (current) use of oral hypoglycemic drugs: Secondary | ICD-10-CM | POA: Diagnosis not present

## 2022-02-24 DIAGNOSIS — Z8719 Personal history of other diseases of the digestive system: Secondary | ICD-10-CM | POA: Diagnosis not present

## 2022-02-24 DIAGNOSIS — Z9981 Dependence on supplemental oxygen: Secondary | ICD-10-CM | POA: Diagnosis not present

## 2022-02-24 DIAGNOSIS — J439 Emphysema, unspecified: Secondary | ICD-10-CM | POA: Diagnosis not present

## 2022-02-24 DIAGNOSIS — Z743 Need for continuous supervision: Secondary | ICD-10-CM | POA: Diagnosis not present

## 2022-02-24 DIAGNOSIS — Z950 Presence of cardiac pacemaker: Secondary | ICD-10-CM | POA: Diagnosis not present

## 2022-02-24 DIAGNOSIS — E7849 Other hyperlipidemia: Secondary | ICD-10-CM | POA: Diagnosis not present

## 2022-02-24 DIAGNOSIS — E785 Hyperlipidemia, unspecified: Secondary | ICD-10-CM | POA: Diagnosis not present

## 2022-02-24 DIAGNOSIS — R0902 Hypoxemia: Secondary | ICD-10-CM | POA: Diagnosis not present

## 2022-02-24 DIAGNOSIS — Z7901 Long term (current) use of anticoagulants: Secondary | ICD-10-CM | POA: Diagnosis not present

## 2022-02-24 DIAGNOSIS — Z28310 Unvaccinated for covid-19: Secondary | ICD-10-CM | POA: Diagnosis not present

## 2022-02-24 DIAGNOSIS — Z20822 Contact with and (suspected) exposure to covid-19: Secondary | ICD-10-CM | POA: Diagnosis not present

## 2022-02-24 DIAGNOSIS — E119 Type 2 diabetes mellitus without complications: Secondary | ICD-10-CM | POA: Diagnosis not present

## 2022-02-24 DIAGNOSIS — I249 Acute ischemic heart disease, unspecified: Secondary | ICD-10-CM | POA: Diagnosis not present

## 2022-02-24 DIAGNOSIS — J90 Pleural effusion, not elsewhere classified: Secondary | ICD-10-CM | POA: Diagnosis not present

## 2022-02-24 DIAGNOSIS — J9601 Acute respiratory failure with hypoxia: Secondary | ICD-10-CM | POA: Diagnosis not present

## 2022-02-24 DIAGNOSIS — R0602 Shortness of breath: Secondary | ICD-10-CM | POA: Diagnosis not present

## 2022-02-24 DIAGNOSIS — K8020 Calculus of gallbladder without cholecystitis without obstruction: Secondary | ICD-10-CM | POA: Diagnosis not present

## 2022-02-24 DIAGNOSIS — N179 Acute kidney failure, unspecified: Secondary | ICD-10-CM | POA: Diagnosis not present

## 2022-02-24 DIAGNOSIS — R140 Abdominal distension (gaseous): Secondary | ICD-10-CM | POA: Diagnosis not present

## 2022-02-24 DIAGNOSIS — R079 Chest pain, unspecified: Secondary | ICD-10-CM | POA: Diagnosis not present

## 2022-02-24 NOTE — Telephone Encounter (Signed)
Patient c/o Palpitations:  High priority if patient c/o lightheadedness, shortness of breath, or chest pain  How long have you had palpitations/irregular HR/ Afib? 2-3 days she had been having palpitations. Are you having the symptoms now? yes  Are you currently experiencing lightheadedness, SOB or CP? No   Do you have a history of afib (atrial fibrillation) or irregular heart rhythm? Irregular heart rhythm, she has a pace maker  Have you checked your BP or HR? (document readings if available): no  Are you experiencing any other symptoms? She stated at night for several days her heart would hurt, she would take some tums and it would help.

## 2022-02-24 NOTE — Telephone Encounter (Signed)
Reports having palpitations this morning. Reports 1-2 days ago having chest pain rated 8/10 with dizziness and SOB. Reports she has recently been cleaning out closets that involved lifting and pulling. Took TUMS and says the pain resolved. Denies active chest pain. Has not check BP or HR. First available appointment given to see Dr. Harrington Challenger at the [place] office on 03/12/2022 '@9'$ :00 am. Also scheduled to see Allred on 04/02/2022 '@11'$ :15 am. Advised to contact PCP for appointment with symptoms as well. Advised if symptoms return, to go to the ED for an evaluation. Verbalized understanding of plan.

## 2022-02-25 DIAGNOSIS — K8020 Calculus of gallbladder without cholecystitis without obstruction: Secondary | ICD-10-CM | POA: Diagnosis not present

## 2022-02-25 DIAGNOSIS — R109 Unspecified abdominal pain: Secondary | ICD-10-CM | POA: Diagnosis not present

## 2022-02-25 DIAGNOSIS — U071 COVID-19: Secondary | ICD-10-CM | POA: Diagnosis not present

## 2022-02-26 DIAGNOSIS — R0902 Hypoxemia: Secondary | ICD-10-CM | POA: Diagnosis not present

## 2022-02-26 DIAGNOSIS — J90 Pleural effusion, not elsewhere classified: Secondary | ICD-10-CM | POA: Diagnosis not present

## 2022-03-02 DIAGNOSIS — I4891 Unspecified atrial fibrillation: Secondary | ICD-10-CM | POA: Diagnosis not present

## 2022-03-02 DIAGNOSIS — Z743 Need for continuous supervision: Secondary | ICD-10-CM | POA: Diagnosis not present

## 2022-03-02 DIAGNOSIS — R072 Precordial pain: Secondary | ICD-10-CM | POA: Diagnosis not present

## 2022-03-02 DIAGNOSIS — E119 Type 2 diabetes mellitus without complications: Secondary | ICD-10-CM | POA: Diagnosis not present

## 2022-03-02 DIAGNOSIS — J439 Emphysema, unspecified: Secondary | ICD-10-CM | POA: Diagnosis not present

## 2022-03-02 DIAGNOSIS — R69 Illness, unspecified: Secondary | ICD-10-CM | POA: Diagnosis not present

## 2022-03-02 DIAGNOSIS — N179 Acute kidney failure, unspecified: Secondary | ICD-10-CM | POA: Diagnosis not present

## 2022-03-02 DIAGNOSIS — I10 Essential (primary) hypertension: Secondary | ICD-10-CM | POA: Diagnosis not present

## 2022-03-02 DIAGNOSIS — R5381 Other malaise: Secondary | ICD-10-CM | POA: Diagnosis not present

## 2022-03-04 DIAGNOSIS — I10 Essential (primary) hypertension: Secondary | ICD-10-CM | POA: Diagnosis not present

## 2022-03-04 DIAGNOSIS — J439 Emphysema, unspecified: Secondary | ICD-10-CM | POA: Diagnosis not present

## 2022-03-04 DIAGNOSIS — R072 Precordial pain: Secondary | ICD-10-CM | POA: Diagnosis not present

## 2022-03-04 DIAGNOSIS — N179 Acute kidney failure, unspecified: Secondary | ICD-10-CM | POA: Diagnosis not present

## 2022-03-04 DIAGNOSIS — E119 Type 2 diabetes mellitus without complications: Secondary | ICD-10-CM | POA: Diagnosis not present

## 2022-03-04 DIAGNOSIS — I4891 Unspecified atrial fibrillation: Secondary | ICD-10-CM | POA: Diagnosis not present

## 2022-03-08 DIAGNOSIS — J029 Acute pharyngitis, unspecified: Secondary | ICD-10-CM | POA: Diagnosis not present

## 2022-03-08 DIAGNOSIS — B370 Candidal stomatitis: Secondary | ICD-10-CM | POA: Diagnosis not present

## 2022-03-09 DIAGNOSIS — N179 Acute kidney failure, unspecified: Secondary | ICD-10-CM | POA: Diagnosis not present

## 2022-03-09 DIAGNOSIS — J439 Emphysema, unspecified: Secondary | ICD-10-CM | POA: Diagnosis not present

## 2022-03-09 DIAGNOSIS — R072 Precordial pain: Secondary | ICD-10-CM | POA: Diagnosis not present

## 2022-03-09 DIAGNOSIS — I4891 Unspecified atrial fibrillation: Secondary | ICD-10-CM | POA: Diagnosis not present

## 2022-03-09 DIAGNOSIS — I10 Essential (primary) hypertension: Secondary | ICD-10-CM | POA: Diagnosis not present

## 2022-03-09 DIAGNOSIS — E119 Type 2 diabetes mellitus without complications: Secondary | ICD-10-CM | POA: Diagnosis not present

## 2022-03-11 DIAGNOSIS — I10 Essential (primary) hypertension: Secondary | ICD-10-CM | POA: Diagnosis not present

## 2022-03-11 DIAGNOSIS — E785 Hyperlipidemia, unspecified: Secondary | ICD-10-CM | POA: Diagnosis not present

## 2022-03-11 DIAGNOSIS — R079 Chest pain, unspecified: Secondary | ICD-10-CM | POA: Diagnosis not present

## 2022-03-11 DIAGNOSIS — I482 Chronic atrial fibrillation, unspecified: Secondary | ICD-10-CM | POA: Diagnosis not present

## 2022-03-11 DIAGNOSIS — E611 Iron deficiency: Secondary | ICD-10-CM | POA: Diagnosis not present

## 2022-03-11 DIAGNOSIS — R0902 Hypoxemia: Secondary | ICD-10-CM | POA: Diagnosis not present

## 2022-03-11 DIAGNOSIS — B370 Candidal stomatitis: Secondary | ICD-10-CM | POA: Diagnosis not present

## 2022-03-11 DIAGNOSIS — E039 Hypothyroidism, unspecified: Secondary | ICD-10-CM | POA: Diagnosis not present

## 2022-03-11 DIAGNOSIS — K8020 Calculus of gallbladder without cholecystitis without obstruction: Secondary | ICD-10-CM | POA: Diagnosis not present

## 2022-03-12 ENCOUNTER — Ambulatory Visit: Payer: Medicare Other | Admitting: Internal Medicine

## 2022-03-12 DIAGNOSIS — E119 Type 2 diabetes mellitus without complications: Secondary | ICD-10-CM | POA: Diagnosis not present

## 2022-03-12 DIAGNOSIS — R072 Precordial pain: Secondary | ICD-10-CM | POA: Diagnosis not present

## 2022-03-12 DIAGNOSIS — N179 Acute kidney failure, unspecified: Secondary | ICD-10-CM | POA: Diagnosis not present

## 2022-03-12 DIAGNOSIS — I4891 Unspecified atrial fibrillation: Secondary | ICD-10-CM | POA: Diagnosis not present

## 2022-03-12 DIAGNOSIS — J439 Emphysema, unspecified: Secondary | ICD-10-CM | POA: Diagnosis not present

## 2022-03-12 DIAGNOSIS — I10 Essential (primary) hypertension: Secondary | ICD-10-CM | POA: Diagnosis not present

## 2022-03-15 DIAGNOSIS — N179 Acute kidney failure, unspecified: Secondary | ICD-10-CM | POA: Diagnosis not present

## 2022-03-15 DIAGNOSIS — J439 Emphysema, unspecified: Secondary | ICD-10-CM | POA: Diagnosis not present

## 2022-03-15 DIAGNOSIS — R072 Precordial pain: Secondary | ICD-10-CM | POA: Diagnosis not present

## 2022-03-15 DIAGNOSIS — E119 Type 2 diabetes mellitus without complications: Secondary | ICD-10-CM | POA: Diagnosis not present

## 2022-03-15 DIAGNOSIS — I10 Essential (primary) hypertension: Secondary | ICD-10-CM | POA: Diagnosis not present

## 2022-03-15 DIAGNOSIS — I4891 Unspecified atrial fibrillation: Secondary | ICD-10-CM | POA: Diagnosis not present

## 2022-03-18 ENCOUNTER — Encounter: Payer: Self-pay | Admitting: Internal Medicine

## 2022-03-18 DIAGNOSIS — M6281 Muscle weakness (generalized): Secondary | ICD-10-CM | POA: Diagnosis not present

## 2022-03-18 DIAGNOSIS — G894 Chronic pain syndrome: Secondary | ICD-10-CM | POA: Diagnosis not present

## 2022-03-22 DIAGNOSIS — I4891 Unspecified atrial fibrillation: Secondary | ICD-10-CM | POA: Diagnosis not present

## 2022-03-22 DIAGNOSIS — J439 Emphysema, unspecified: Secondary | ICD-10-CM | POA: Diagnosis not present

## 2022-03-22 DIAGNOSIS — N179 Acute kidney failure, unspecified: Secondary | ICD-10-CM | POA: Diagnosis not present

## 2022-03-22 DIAGNOSIS — E119 Type 2 diabetes mellitus without complications: Secondary | ICD-10-CM | POA: Diagnosis not present

## 2022-03-22 DIAGNOSIS — I10 Essential (primary) hypertension: Secondary | ICD-10-CM | POA: Diagnosis not present

## 2022-03-22 DIAGNOSIS — R072 Precordial pain: Secondary | ICD-10-CM | POA: Diagnosis not present

## 2022-03-24 DIAGNOSIS — I4891 Unspecified atrial fibrillation: Secondary | ICD-10-CM | POA: Diagnosis not present

## 2022-03-24 DIAGNOSIS — R072 Precordial pain: Secondary | ICD-10-CM | POA: Diagnosis not present

## 2022-03-24 DIAGNOSIS — E119 Type 2 diabetes mellitus without complications: Secondary | ICD-10-CM | POA: Diagnosis not present

## 2022-03-24 DIAGNOSIS — I10 Essential (primary) hypertension: Secondary | ICD-10-CM | POA: Diagnosis not present

## 2022-03-24 DIAGNOSIS — N179 Acute kidney failure, unspecified: Secondary | ICD-10-CM | POA: Diagnosis not present

## 2022-03-24 DIAGNOSIS — J439 Emphysema, unspecified: Secondary | ICD-10-CM | POA: Diagnosis not present

## 2022-03-27 DIAGNOSIS — R0902 Hypoxemia: Secondary | ICD-10-CM | POA: Diagnosis not present

## 2022-03-27 DIAGNOSIS — U071 COVID-19: Secondary | ICD-10-CM | POA: Diagnosis not present

## 2022-03-29 DIAGNOSIS — E119 Type 2 diabetes mellitus without complications: Secondary | ICD-10-CM | POA: Diagnosis not present

## 2022-03-29 DIAGNOSIS — I4891 Unspecified atrial fibrillation: Secondary | ICD-10-CM | POA: Diagnosis not present

## 2022-03-29 DIAGNOSIS — N179 Acute kidney failure, unspecified: Secondary | ICD-10-CM | POA: Diagnosis not present

## 2022-03-29 DIAGNOSIS — Z888 Allergy status to other drugs, medicaments and biological substances status: Secondary | ICD-10-CM | POA: Diagnosis not present

## 2022-03-29 DIAGNOSIS — I517 Cardiomegaly: Secondary | ICD-10-CM | POA: Diagnosis not present

## 2022-03-29 DIAGNOSIS — R079 Chest pain, unspecified: Secondary | ICD-10-CM | POA: Diagnosis not present

## 2022-03-29 DIAGNOSIS — E039 Hypothyroidism, unspecified: Secondary | ICD-10-CM | POA: Diagnosis not present

## 2022-03-29 DIAGNOSIS — R0789 Other chest pain: Secondary | ICD-10-CM | POA: Diagnosis not present

## 2022-03-29 DIAGNOSIS — Z7901 Long term (current) use of anticoagulants: Secondary | ICD-10-CM | POA: Diagnosis not present

## 2022-03-29 DIAGNOSIS — I10 Essential (primary) hypertension: Secondary | ICD-10-CM | POA: Diagnosis not present

## 2022-03-29 DIAGNOSIS — I272 Pulmonary hypertension, unspecified: Secondary | ICD-10-CM | POA: Diagnosis not present

## 2022-03-29 DIAGNOSIS — I4510 Unspecified right bundle-branch block: Secondary | ICD-10-CM | POA: Diagnosis not present

## 2022-03-29 DIAGNOSIS — Z881 Allergy status to other antibiotic agents status: Secondary | ICD-10-CM | POA: Diagnosis not present

## 2022-03-29 DIAGNOSIS — J811 Chronic pulmonary edema: Secondary | ICD-10-CM | POA: Diagnosis not present

## 2022-03-29 DIAGNOSIS — Z79899 Other long term (current) drug therapy: Secondary | ICD-10-CM | POA: Diagnosis not present

## 2022-03-29 DIAGNOSIS — R072 Precordial pain: Secondary | ICD-10-CM | POA: Diagnosis not present

## 2022-03-29 DIAGNOSIS — J439 Emphysema, unspecified: Secondary | ICD-10-CM | POA: Diagnosis not present

## 2022-03-29 DIAGNOSIS — Z743 Need for continuous supervision: Secondary | ICD-10-CM | POA: Diagnosis not present

## 2022-04-02 ENCOUNTER — Encounter: Payer: Self-pay | Admitting: Internal Medicine

## 2022-04-02 ENCOUNTER — Ambulatory Visit (INDEPENDENT_AMBULATORY_CARE_PROVIDER_SITE_OTHER): Payer: Medicare Other | Admitting: Internal Medicine

## 2022-04-02 VITALS — BP 126/64 | HR 80 | Ht 63.0 in | Wt 152.8 lb

## 2022-04-02 DIAGNOSIS — I480 Paroxysmal atrial fibrillation: Secondary | ICD-10-CM | POA: Diagnosis not present

## 2022-04-02 DIAGNOSIS — I495 Sick sinus syndrome: Secondary | ICD-10-CM | POA: Diagnosis not present

## 2022-04-02 DIAGNOSIS — I10 Essential (primary) hypertension: Secondary | ICD-10-CM | POA: Diagnosis not present

## 2022-04-02 MED ORDER — AMIODARONE HCL 200 MG PO TABS: ORAL_TABLET | ORAL | 3 refills | Status: DC

## 2022-04-02 NOTE — Progress Notes (Signed)
PCP: [facility]   Primary EP:  Dr Rayann Heman  Melanie [name] is a 88 y.o. female who presents today for routine electrophysiology followup.  Since last being seen in our clinic, the patient reports doing reasonably well.  She is active for her age.  Her primary concern is with chest wall pain.  She has had several ER visits for this.  Her pain is worse with pushing on her chest or laying in bed.  Today, she denies symptoms of palpitations, exertional chest pain, shortness of breath,  lower extremity edema, dizziness, presyncope, or syncope.  The patient is otherwise without complaint today.   Past Medical History:  Diagnosis Date   CAD (coronary artery disease)    Two vessel, quiescent; 70% LAD aretery disease and well collateralized, 100% RCA by cardiac cath in 2004; NL LVF; low risk Cardioloite; EF 74%, 11/10   Dyslipidemia    Hypercalcemia    Hypertension    Hypothyroidism    Paroxysmal atrial fibrillation (HCC)    on coumadin and amiodarone   Sinus node dysfunction (HCC)    s/p Medtronic dual-chamber pacemaker   Vertigo    Past Surgical History:  Procedure Laterality Date   ABDOMINAL HYSTERECTOMY     FRACTURE SURGERY     PACEMAKER GENERATOR CHANGE N/A 02/12/2013   Procedure: PACEMAKER GENERATOR CHANGE;  Surgeon: Deboraha Sprang, MD;  Location: [facility] CATH LAB;  Service: Cardiovascular;  Laterality: N/A;   PACEMAKER INSERTION  05/24/03   MDT Kappa    ROS- all systems are reviewed and negative except as per HPI above  Current Outpatient Medications  Medication Sig Dispense Refill   acetaminophen (TYLENOL) 325 MG tablet Take 650 mg by mouth every 6 (six) hours as needed.     amiodarone (PACERONE) 100 MG tablet Take 1 tablet (100 mg total) by mouth daily.     amLODipine (NORVASC) 10 MG tablet Take 10 mg by mouth daily.     bisacodyl (DULCOLAX) 5 MG EC tablet Take 1 tablet (5 mg total) by mouth 2 (two) times daily as needed for moderate constipation. 14 tablet 0    cetirizine (ZYRTEC) 10 MG tablet Take 10 mg by mouth at bedtime.     clonazePAM (KLONOPIN) 0.5 MG tablet Take 0.5 mg by mouth at bedtime. For restless leg syndrome     DOK 100 MG capsule Take 100 mg by mouth 2 (two) times daily.     Elastic Bandages & Supports (MEDICAL COMPRESSION STOCKINGS) MISC 1 each by Does not apply route as directed. Knee Hi Compression Stockings to be measured prior to order-medium pressure Dx: Lower extremity edema & CHF 1 each 0   ezetimibe (ZETIA) 10 MG tablet Take 10 mg by mouth daily.     furosemide (LASIX) 40 MG tablet Take 40 mg by mouth daily.     HM CRANBERRY 500 MG TABS Take 1 tablet by mouth daily.     isosorbide mononitrate (IMDUR) 30 MG 24 hr tablet Take 30 mg by mouth every morning.      levothyroxine (SYNTHROID) 100 MCG tablet Take 100 mcg by mouth daily before breakfast.     levothyroxine (SYNTHROID) 112 MCG tablet Take 112 mcg by mouth daily.     metoprolol tartrate (LOPRESSOR) 25 MG tablet Take 25 mg by mouth daily.     pantoprazole (PROTONIX) 40 MG tablet Take 40 mg by mouth daily.     polyethylene glycol powder (GLYCOLAX/MIRALAX) 17 GM/SCOOP powder SMARTSIG:1 Capful(s) By Mouth Daily  Probiotic Product (PRO-BIOTIC BLEND PO) Take by mouth as needed.      warfarin (COUMADIN) 2 MG tablet Take 2 mg by mouth. Take '2mg'$  every day except Monday; skip Mondays - or take as directed by MD     No current facility-administered medications for this visit.    Physical Exam: Vitals:   04/02/22 1059  BP: 126/64  Pulse: 80  SpO2: 96%  Weight: 152 lb 12.8 oz (69.3 kg)  Height: '5\' 3"'$  (1.6 m)    GEN- The patient is well appearing, alert and oriented x 3 today.   Head- normocephalic, atraumatic Eyes-  Sclera clear, conjunctiva pink Ears- hearing intact Oropharynx- clear Lungs- Clear to ausculation bilaterally, normal work of breathing Chest- pacemaker pocket is well healed Chest wall is very tender to touch along sternum and costosternal joins  today Heart- Regular rate and rhythm, no murmurs, rubs or gallops, PMI not laterally displaced GI- soft, NT, ND, + BS Extremities- no clubbing, cyanosis, or edema  Pacemaker interrogation- reviewed in detail today,  See PACEART report  ekg tracing ordered today is personally reviewed and shows atrial paced rhythm 80 bpm, PR 324 msec, no ischemic changes  Assessment and Plan:  1. Symptomatic sinus bradycardia  Normal pacemaker function See Pace Art report No changes today she is not device dependant today  2. Paroxysmal atrial fibrillation Afib burden is 11 % (previously 0.4%) Labs 03/29/22 reviewed Chads2vasc score is 5.  She is on coumadin Increase amiodarone to '200mg'$  daily x 4 weeks, then return to '100mg'$  daily given recently increased AF burden.   3. HTN Stable No change required today  4. Chest wall pain Several recent ER visits for chest wall pain.  Does not appear cardiac.  EKG unchanged No cardiac workup currently advised We discussed supportive techniques today  Risks, benefits and potential toxicities for medications prescribed and/or refilled reviewed with patient today.   Return in a year  Thompson Grayer MD, [facility] 04/02/2022 11:29 AM

## 2022-04-02 NOTE — Patient Instructions (Signed)
Medication Instructions:  Increase Amiodarone to '200mg'$  daily x 4 weeks, then decrease to '100mg'$  daily there after.  Continue all other medications.     Labwork: none  Testing/Procedures: none  Follow-Up: 1 year - Dr.  Rayann Heman   Any Other Special Instructions Will Be Listed Below (If Applicable).   If you need a refill on your cardiac medications before your next appointment, please call your pharmacy.

## 2022-04-14 DIAGNOSIS — L719 Rosacea, unspecified: Secondary | ICD-10-CM | POA: Diagnosis not present

## 2022-04-14 DIAGNOSIS — K5900 Constipation, unspecified: Secondary | ICD-10-CM | POA: Diagnosis not present

## 2022-04-14 DIAGNOSIS — D7389 Other diseases of spleen: Secondary | ICD-10-CM | POA: Diagnosis not present

## 2022-04-14 DIAGNOSIS — R030 Elevated blood-pressure reading, without diagnosis of hypertension: Secondary | ICD-10-CM | POA: Diagnosis not present

## 2022-04-14 DIAGNOSIS — K219 Gastro-esophageal reflux disease without esophagitis: Secondary | ICD-10-CM | POA: Diagnosis not present

## 2022-04-14 DIAGNOSIS — Z950 Presence of cardiac pacemaker: Secondary | ICD-10-CM | POA: Diagnosis not present

## 2022-04-14 DIAGNOSIS — I482 Chronic atrial fibrillation, unspecified: Secondary | ICD-10-CM | POA: Diagnosis not present

## 2022-04-14 DIAGNOSIS — N390 Urinary tract infection, site not specified: Secondary | ICD-10-CM | POA: Diagnosis not present

## 2022-04-14 DIAGNOSIS — K649 Unspecified hemorrhoids: Secondary | ICD-10-CM | POA: Diagnosis not present

## 2022-04-15 ENCOUNTER — Telehealth: Payer: Self-pay | Admitting: Internal Medicine

## 2022-04-15 NOTE — Telephone Encounter (Signed)
Pt c/o medication issue:  1. Name of Medication: amiodarone (PACERONE) 200 MG tablet  2. How are you currently taking this medication (dosage and times per day)? She is currently taking '100mg'$  daily.   3. Are you having a reaction (difficulty breathing--STAT)? no  4. What is your medication issue? She states it has a bad reaction if she takes too much at one time, she does well on '100mg'$  of it.   She said she is not going to take the '200mg'$  of Amiodarone.

## 2022-04-15 NOTE — Telephone Encounter (Signed)
Left message to return call 

## 2022-04-16 DIAGNOSIS — U071 COVID-19: Secondary | ICD-10-CM | POA: Diagnosis not present

## 2022-04-19 DIAGNOSIS — M6281 Muscle weakness (generalized): Secondary | ICD-10-CM | POA: Diagnosis not present

## 2022-04-19 DIAGNOSIS — G894 Chronic pain syndrome: Secondary | ICD-10-CM | POA: Diagnosis not present

## 2022-04-19 NOTE — Telephone Encounter (Signed)
I have not seen this patient, would defer to Dr Rayann Heman who had made increase. Looks like plan was just for higher dose for 4 weeks due to some afib noted on her monitor. I would defer to Dr Cira Rue MD

## 2022-04-20 ENCOUNTER — Other Ambulatory Visit (HOSPITAL_COMMUNITY): Payer: Self-pay | Admitting: Physician Assistant

## 2022-04-20 ENCOUNTER — Ambulatory Visit (INDEPENDENT_AMBULATORY_CARE_PROVIDER_SITE_OTHER): Payer: Medicare Other

## 2022-04-20 ENCOUNTER — Encounter: Payer: Self-pay | Admitting: *Deleted

## 2022-04-20 DIAGNOSIS — I495 Sick sinus syndrome: Secondary | ICD-10-CM | POA: Diagnosis not present

## 2022-04-20 DIAGNOSIS — D7389 Other diseases of spleen: Secondary | ICD-10-CM

## 2022-04-20 NOTE — Telephone Encounter (Signed)
This encounter was created in error - please disregard.

## 2022-04-21 LAB — CUP PACEART REMOTE DEVICE CHECK
Battery Impedance: 1015 Ohm
Battery Remaining Longevity: 64 mo
Battery Voltage: 2.78 V
Brady Statistic AP VP Percent: 0 %
Brady Statistic AP VS Percent: 63 %
Brady Statistic AS VP Percent: 1 %
Brady Statistic AS VS Percent: 35 %
Date Time Interrogation Session: 20230726111356
Implantable Lead Implant Date: 20040827
Implantable Lead Implant Date: 20040827
Implantable Lead Location: 753859
Implantable Lead Location: 753860
Implantable Lead Model: 4092
Implantable Pulse Generator Implant Date: 20140519
Lead Channel Impedance Value: 442 Ohm
Lead Channel Impedance Value: 907 Ohm
Lead Channel Pacing Threshold Amplitude: 0.75 V
Lead Channel Pacing Threshold Amplitude: 1.125 V
Lead Channel Pacing Threshold Pulse Width: 0.4 ms
Lead Channel Pacing Threshold Pulse Width: 0.4 ms
Lead Channel Setting Pacing Amplitude: 2 V
Lead Channel Setting Pacing Amplitude: 2.5 V
Lead Channel Setting Pacing Pulse Width: 0.4 ms
Lead Channel Setting Sensing Sensitivity: 4 mV

## 2022-04-21 NOTE — Telephone Encounter (Signed)
Pt returning nurse's call. Please advise

## 2022-04-21 NOTE — Telephone Encounter (Signed)
Called and spoke to patient who states that she is too scared to increase her Amiodarone to '200mg'$  daily, because she has done this once in the past and her HR dropped "real low." "I was in bad shape, I was seeing things, I just won't take it." Pt expresses concerns stating that when the above event occurred, she was taken to the hospital and had a pacemaker put in. I educated patient that the medicine not being able to chemically convert her was why she ended up with the pacemaker, and that it was not a result of the increased dose. Pt still adamant that she won't take the increased dose. Will route to covering RN as Juluis Rainier.  OV note from 04/02/22:  2. Paroxysmal atrial fibrillation Afib burden is 11 % (previously 0.4%) Labs 03/29/22 reviewed Chads2vasc score is 5.  She is on coumadin Increase amiodarone to '200mg'$  daily x 4 weeks, then return to '100mg'$  daily given recently increased AF burden.

## 2022-04-27 ENCOUNTER — Ambulatory Visit (INDEPENDENT_AMBULATORY_CARE_PROVIDER_SITE_OTHER): Payer: Medicare Other | Admitting: Gastroenterology

## 2022-04-27 ENCOUNTER — Encounter: Payer: Self-pay | Admitting: Gastroenterology

## 2022-04-27 VITALS — BP 147/75 | HR 69 | Temp 97.5°F | Ht 60.0 in | Wt 147.8 lb

## 2022-04-27 DIAGNOSIS — R195 Other fecal abnormalities: Secondary | ICD-10-CM | POA: Diagnosis not present

## 2022-04-27 DIAGNOSIS — K5900 Constipation, unspecified: Secondary | ICD-10-CM | POA: Diagnosis not present

## 2022-04-27 DIAGNOSIS — I10 Essential (primary) hypertension: Secondary | ICD-10-CM | POA: Diagnosis not present

## 2022-04-27 DIAGNOSIS — E7849 Other hyperlipidemia: Secondary | ICD-10-CM | POA: Diagnosis not present

## 2022-04-27 DIAGNOSIS — K625 Hemorrhage of anus and rectum: Secondary | ICD-10-CM | POA: Diagnosis not present

## 2022-04-27 DIAGNOSIS — Z0001 Encounter for general adult medical examination with abnormal findings: Secondary | ICD-10-CM | POA: Diagnosis not present

## 2022-04-27 MED ORDER — POLYETHYLENE GLYCOL 3350 17 GM/SCOOP PO POWD: ORAL | 11 refills | Status: DC

## 2022-04-27 MED ORDER — HYDROCORTISONE (PERIANAL) 2.5 % EX CREA: 1.0000 | TOPICAL_CREAM | Freq: Two times a day (BID) | CUTANEOUS | 1 refills | Status: DC | PRN

## 2022-04-27 NOTE — Progress Notes (Signed)
GI Office Note    Referring Provider: Practice, [place] Primary Care Physician:  Practice, [facility]  Primary Gastroenterologist: Elon Alas. Abbey Chatters, DO (previously Dr. Doristine Mango.)   Chief Complaint   Chief Complaint  Patient presents with   Blood In Stools    Last seen 3 to 4 weeks ago, states that she has hemorrhoids bad.     History of Present Illness   Melanie [name] is a 88 y.o. female presenting today at the request of Dr. Quintin Alto for further evaluation of blood in the stool.   Patient resides at [facility] in [place] (assisted living). She states she has "bad" hemorrhoids. She has to strain a lot with BMs. BMs infrequent. She takes miralax on occasions, states it is available to her. Stools have been dark but not black. Previously she was having some abdominal bloating but that resolved. Weight has been stable. She reports last colonoscopy around 10 years ago with Dr. Britta Mccreedy, states she had polyps. No heartburn, vomiting.   Sister who is with her today is concerned about a spleen lesion that was found on imaging last year.  March 29, 2022: Hemoglobin 11.2, hematocrit 35.3, MCV 93.6, platelets 142,000.  Labs from March 11, 2022: Hemoglobin 10.3, hematocrit 31.7.  Right upper quadrant ultrasound June 2023: -Cirrhosis -Cholelithiasis  CT abdomen with contrast September 2022: -Stable sub-cm low-attenuation splenic lesions, most likely benign in  etiology. Recommend continued imaging follow-up with CT or MRI in 6  months. This recommendation follows ACR consensus guidelines: White  Paper of the ACR Incidental Findings Committee II on Splenic and  Nodal Findings. [place] [phone].  -Cirrhosis.  No evidence of hepatic mass.  -Stable tiny hiatal hernia.  -Cholelithiasis. No radiographic evidence of cholecystitis.  -Stable tiny umbilical hernia containing only fat.  -Aortic Atherosclerosis (ICD10-I70.0).     Medications   Current  Outpatient Medications  Medication Sig Dispense Refill   acetaminophen (TYLENOL) 325 MG tablet Take 650 mg by mouth every 6 (six) hours as needed.     amiodarone (PACERONE) 200 MG tablet Take '200mg'$  (1 tab) by mouth daily x 4 weeks, then decrease to '100mg'$  (1/2 tab) by mouth daily thereafter 90 tablet 3   amLODipine (NORVASC) 10 MG tablet Take 10 mg by mouth daily.     clonazePAM (KLONOPIN) 0.5 MG tablet Take 0.5 mg by mouth at bedtime. For restless leg syndrome     Elastic Bandages & Supports (MEDICAL COMPRESSION STOCKINGS) MISC 1 each by Does not apply route as directed. Knee Hi Compression Stockings to be measured prior to order-medium pressure Dx: Lower extremity edema & CHF 1 each 0   ELIQUIS 5 MG TABS tablet Take 5 mg by mouth 2 (two) times daily.     ezetimibe (ZETIA) 10 MG tablet Take 10 mg by mouth daily.     furosemide (LASIX) 40 MG tablet Take 40 mg by mouth daily.     HM CRANBERRY 500 MG TABS Take 1 tablet by mouth daily.     isosorbide mononitrate (IMDUR) 30 MG 24 hr tablet Take 30 mg by mouth every morning.      levothyroxine (SYNTHROID) 112 MCG tablet Take 112 mcg by mouth daily.     metoprolol tartrate (LOPRESSOR) 25 MG tablet Take 25 mg by mouth daily.     nitrofurantoin (MACRODANTIN) 50 MG capsule Take 50 mg by mouth at bedtime.     pantoprazole (PROTONIX) 40 MG tablet Take 40 mg by mouth daily.  No current facility-administered medications for this visit.    Allergies   Allergies as of 04/27/2022 - Review Complete 04/27/2022  Allergen Reaction Noted   Ciprofloxacin Other (See Comments) 07/31/2012   Hctz [hydrochlorothiazide] Other (See Comments) 07/31/2012   Lopid [gemfibrozil] Other (See Comments)      Past Medical History   Past Medical History:  Diagnosis Date   CAD (coronary artery disease)    Two vessel, quiescent; 70% LAD aretery disease and well collateralized, 100% RCA by cardiac cath in 2004; NL LVF; low risk Cardioloite; EF 74%, 11/10   Dyslipidemia     Hypercalcemia    Hypertension    Hypothyroidism    Paroxysmal atrial fibrillation (HCC)    on Eliquis and amiodarone   Sinus node dysfunction (HCC)    s/p Medtronic dual-chamber pacemaker   Vertigo     Past Surgical History   Past Surgical History:  Procedure Laterality Date   ABDOMINAL HYSTERECTOMY     FRACTURE SURGERY     PACEMAKER GENERATOR CHANGE N/A 02/12/2013   Procedure: PACEMAKER GENERATOR CHANGE;  Surgeon: Deboraha Sprang, MD;  Location: [facility] CATH LAB;  Service: Cardiovascular;  Laterality: N/A;   PACEMAKER INSERTION  05/24/03   MDT Kappa    Past Family History   Family History  Problem Relation Age of Onset   Stroke Father    Diabetes Sister    Hypertension Sister    Stroke Brother    Coronary artery disease Neg Hx     Past Social History   Social History   Socioeconomic History   Marital status: Single    Spouse name: Not on file   Number of children: Not on file   Years of education: Not on file   Highest education level: Not on file  Occupational History   Not on file  Tobacco Use   Smoking status: Never   Smokeless tobacco: Never  Substance and Sexual Activity   Alcohol use: No   Drug use: No   Sexual activity: Not Currently  Other Topics Concern   Not on file  Social History Narrative   Not on file   Social Determinants of Health   Financial Resource Strain: Not on file  Food Insecurity: Not on file  Transportation Needs: Not on file  Physical Activity: Not on file  Stress: Not on file  Social Connections: Not on file  Intimate Partner Violence: Not on file    Review of Systems   General: Negative for anorexia, weight loss, fever, chills, fatigue, +weakness. ENT: Negative for hoarseness, difficulty swallowing , nasal congestion. CV: Negative for chest pain, angina, palpitations, dyspnea on exertion, peripheral edema.  Respiratory: Negative for dyspnea at rest, dyspnea on exertion, cough, sputum, wheezing.  GI: See history of  present illness. GU:  Negative for dysuria, hematuria, urinary incontinence, urinary frequency, nocturnal urination.  Endo: Negative for unusual weight change.     Physical Exam   BP (!) 147/75 (BP Location: Left Arm, Patient Position: Sitting, Cuff Size: Normal)   Pulse 69   Temp (!) 97.5 F (36.4 C) (Temporal)   Ht 5' (1.524 m)   Wt 147 lb 12.8 oz (67 kg)   SpO2 94%   BMI 28.87 kg/m    General: Well-nourished, well-developed in no acute distress. Elderly.  Eyes: No icterus. Mouth: Oropharyngeal mucosa moist and pink , no lesions erythema or exudate. Lungs: Clear to auscultation bilaterally.  Heart: Regular rate and rhythm, no murmurs rubs or gallops.  Abdomen: Bowel sounds  are normal, nontender, nondistended, no hepatosplenomegaly or masses,  no abdominal bruits or hernia , no rebound or guarding.  Rectal: small ext hemorrhoids nonthrombosed/nonbleeding. Hemorrhoid palpated inside anal canal. Light brown stool secretions present. No masses in rectal vault. Nontendern. Extremities: No lower extremity edema. No clubbing or deformities. Neuro: Alert and oriented x 4   Skin: Warm and dry, no jaundice.   Psych: Alert and cooperative, normal mood and affect.  Labs   See hpi  Imaging Studies   CUP PACEART REMOTE DEVICE CHECK  Result Date: 04/21/2022 Scheduled remote reviewed. Normal device function.  16 AHR's for AF/flutter, controlled rates 1 AHR showing 1:1 Burden 10.8%, Warfarin, Metoprolol, Amiodarone Next remote 91 days. LA   Assessment   Rectal bleeding: suspect bleeding from hemorrhoid disease in setting of constipation. Cannot exclude underlying malignancy. At this time she is not interested in pursuing invasive testing given her age.   Constipation: add miralax daily instead of prn dose. Start off low dose and titrate as needed.   Splenic lesion:noted on CT in 05/2021, stable sub-cm lesion, likely benign but CT or MRI recommended in six months. Her PCP has already  ordered this but patient did not schedule yet. I am requesting she reach out to PCP to get scheduled since it is already in process.   PLAN   Start miralax 1/2 capful daily to help with stool frequency and avoid straining. She can hold if develops diarrhea.  If miralax dose not effective, she can call and we can adjust the dose.  Anusol 2.5% anorectally bid as needed. Update labs in four weeks to make sure stable (CBC, iron/tibc/ferritin).  She will follow up with PCP to get CT scan of spleen scheduled (it was ordered by Tawni Carnes, PA-C).  Return ov here in 8 weeks.  If blood count drops or she starts seeing black/bloody stools, she may require endoscopic evaluation but for now patient prefers to hold off on invasive testing/sedation.   Laureen Ochs. Bobby Rumpf, [place], [facility]

## 2022-04-27 NOTE — Patient Instructions (Addendum)
Start miralax 1/2 capful daily. Hold for diarrhea.  If your stools continue to be hard and require straining, please let me know so I can give orders to increase your miralax. Use hemorrhoid cream as needed for irritation/bleeding. Anusol cream 2.5% anorectally twice daily as needed.  We will recheck your labs (CBC, iron/tibc/ferritin) in four weeks. Please follow up with PCP to get your CT scan of your spleen scheduled as ordered by [facility] PA-C. Return office visit here in 8 weeks. As discussed today, if your blood count (hemoglobin) drops or you start seeing black/bloody stools, you will need further evaluation with possible upper endoscopy and or colonoscopy but for now would hold off on invasive testing.

## 2022-05-03 NOTE — Addendum Note (Signed)
Addended by: Orland Jarred on: 05/03/2022 12:11 PM   Modules accepted: Orders

## 2022-05-12 ENCOUNTER — Other Ambulatory Visit: Payer: Self-pay

## 2022-05-12 DIAGNOSIS — K625 Hemorrhage of anus and rectum: Secondary | ICD-10-CM

## 2022-05-12 DIAGNOSIS — R195 Other fecal abnormalities: Secondary | ICD-10-CM

## 2022-05-12 DIAGNOSIS — K5900 Constipation, unspecified: Secondary | ICD-10-CM

## 2022-05-17 ENCOUNTER — Ambulatory Visit (HOSPITAL_COMMUNITY): Payer: Medicare Other

## 2022-05-18 DIAGNOSIS — M6281 Muscle weakness (generalized): Secondary | ICD-10-CM | POA: Diagnosis not present

## 2022-05-18 DIAGNOSIS — G894 Chronic pain syndrome: Secondary | ICD-10-CM | POA: Diagnosis not present

## 2022-05-20 NOTE — Progress Notes (Signed)
Remote pacemaker transmission.   

## 2022-05-25 ENCOUNTER — Ambulatory Visit (HOSPITAL_COMMUNITY)
Admission: RE | Admit: 2022-05-25 | Discharge: 2022-05-25 | Disposition: A | Discharge status: Home / Self Care | Payer: Medicare Other | Source: Ambulatory Visit | Attending: Physician Assistant | Admitting: Physician Assistant

## 2022-05-25 ENCOUNTER — Encounter (HOSPITAL_COMMUNITY): Payer: Self-pay

## 2022-05-25 DIAGNOSIS — K551 Chronic vascular disorders of intestine: Secondary | ICD-10-CM | POA: Diagnosis not present

## 2022-05-25 DIAGNOSIS — I358 Other nonrheumatic aortic valve disorders: Secondary | ICD-10-CM | POA: Diagnosis not present

## 2022-05-25 DIAGNOSIS — K7689 Other specified diseases of liver: Secondary | ICD-10-CM | POA: Diagnosis not present

## 2022-05-25 DIAGNOSIS — D7389 Other diseases of spleen: Secondary | ICD-10-CM | POA: Diagnosis not present

## 2022-05-25 LAB — POCT I-STAT CREATININE: Creatinine, Ser: 1.5 mg/dL — ABNORMAL HIGH (ref 0.44–1.00)

## 2022-05-25 MED ORDER — IOHEXOL 300 MG/ML  SOLN
100.0000 mL | Freq: Once | INTRAMUSCULAR | Status: AC | PRN
  Administered 2022-05-25: 75 mL via INTRAVENOUS

## 2022-06-15 DIAGNOSIS — R609 Edema, unspecified: Secondary | ICD-10-CM | POA: Diagnosis not present

## 2022-06-15 DIAGNOSIS — W19XXXA Unspecified fall, initial encounter: Secondary | ICD-10-CM | POA: Diagnosis not present

## 2022-06-15 DIAGNOSIS — Z743 Need for continuous supervision: Secondary | ICD-10-CM | POA: Diagnosis not present

## 2022-06-15 DIAGNOSIS — E119 Type 2 diabetes mellitus without complications: Secondary | ICD-10-CM | POA: Diagnosis not present

## 2022-06-15 DIAGNOSIS — I10 Essential (primary) hypertension: Secondary | ICD-10-CM | POA: Diagnosis not present

## 2022-06-15 DIAGNOSIS — R0902 Hypoxemia: Secondary | ICD-10-CM | POA: Diagnosis not present

## 2022-06-15 DIAGNOSIS — S61412A Laceration without foreign body of left hand, initial encounter: Secondary | ICD-10-CM | POA: Diagnosis not present

## 2022-06-15 DIAGNOSIS — R52 Pain, unspecified: Secondary | ICD-10-CM | POA: Diagnosis not present

## 2022-06-15 DIAGNOSIS — W06XXXA Fall from bed, initial encounter: Secondary | ICD-10-CM | POA: Diagnosis not present

## 2022-06-15 DIAGNOSIS — S0083XA Contusion of other part of head, initial encounter: Secondary | ICD-10-CM | POA: Diagnosis not present

## 2022-06-15 DIAGNOSIS — R6889 Other general symptoms and signs: Secondary | ICD-10-CM | POA: Diagnosis not present

## 2022-06-15 DIAGNOSIS — S0003XA Contusion of scalp, initial encounter: Secondary | ICD-10-CM | POA: Diagnosis not present

## 2022-06-19 DIAGNOSIS — S0993XA Unspecified injury of face, initial encounter: Secondary | ICD-10-CM | POA: Diagnosis not present

## 2022-06-19 DIAGNOSIS — W06XXXA Fall from bed, initial encounter: Secondary | ICD-10-CM | POA: Diagnosis not present

## 2022-06-19 DIAGNOSIS — S0990XA Unspecified injury of head, initial encounter: Secondary | ICD-10-CM | POA: Diagnosis not present

## 2022-06-19 DIAGNOSIS — E119 Type 2 diabetes mellitus without complications: Secondary | ICD-10-CM | POA: Diagnosis not present

## 2022-06-19 DIAGNOSIS — Z7901 Long term (current) use of anticoagulants: Secondary | ICD-10-CM | POA: Diagnosis not present

## 2022-06-19 DIAGNOSIS — S0083XA Contusion of other part of head, initial encounter: Secondary | ICD-10-CM | POA: Diagnosis not present

## 2022-06-19 DIAGNOSIS — Z79899 Other long term (current) drug therapy: Secondary | ICD-10-CM | POA: Diagnosis not present

## 2022-06-19 DIAGNOSIS — I10 Essential (primary) hypertension: Secondary | ICD-10-CM | POA: Diagnosis not present

## 2022-06-19 DIAGNOSIS — E039 Hypothyroidism, unspecified: Secondary | ICD-10-CM | POA: Diagnosis not present

## 2022-06-20 ENCOUNTER — Telehealth: Payer: Self-pay | Admitting: Gastroenterology

## 2022-06-20 NOTE — Telephone Encounter (Signed)
Can we find out if patient had labs I ordered a few weeks ago? I think she is at an assisted living facility.

## 2022-06-21 DIAGNOSIS — M6281 Muscle weakness (generalized): Secondary | ICD-10-CM | POA: Diagnosis not present

## 2022-06-21 DIAGNOSIS — G894 Chronic pain syndrome: Secondary | ICD-10-CM | POA: Diagnosis not present

## 2022-06-21 NOTE — Telephone Encounter (Signed)
No ans at [facility] [phone]) Called pt, but didn't understanding what I was asking. Will try [facility] again later.

## 2022-06-24 NOTE — Telephone Encounter (Signed)
Spoke with Martie Lee at [facility] and she is going to check and see if they were done. She stated that she would get back with me tomorrow (06/25/22)

## 2022-06-28 NOTE — Progress Notes (Deleted)
GI Office Note    Referring Provider: Practice, [place] Primary Care Physician:  [facility]  Primary Gastroenterologist: Elon Alas. Abbey Chatters, DO   Chief Complaint   No chief complaint on file.   History of Present Illness   Melanie [name] is a 88 y.o. female presenting today for follow up. Seen in 04/2022 for rectal bleeding, constipation, splenic lesion. Resides at [facility] in [place].   She reports colonoscopy around 10 years ago by Dr. Britta Mccreedy, states she had polyps.   March 29, 2022: Hemoglobin 11.2, hematocrit 35.3, MCV 93.6, platelets 142,000.  Labs from March 11, 2022: Hemoglobin 10.3, hematocrit 31.7.    Right upper quadrant ultrasound June 2023: -Cirrhosis -Cholelithiasis   CT abdomen with contrast September 2022: -Stable sub-cm low-attenuation splenic lesions, most likely benign in  etiology. Recommend continued imaging follow-up with CT or MRI in 6  months. This recommendation follows ACR consensus guidelines: White  Paper of the ACR Incidental Findings Committee II on Splenic and  Nodal Findings. [place] [phone].  -Cirrhosis.  No evidence of hepatic mass.  -Stable tiny hiatal hernia.  -Cholelithiasis. No radiographic evidence of cholecystitis.  -Stable tiny umbilical hernia containing only fat.  -Aortic Atherosclerosis (ICD10-I70.0).    CT Abd 04/2022: IMPRESSION: 1. The lesion of concern in the spleen is no longer visible. 2. Probable hepatic cirrhosis based on nodularity of the left hepatic lobe contour. 3. New 75% central compression fracture at L2 with some middle column involvement and 2 mm of posterior bony retropulsion. 4. Mild further compression of the T12 vertebral fracture. 5. Please note that vertebral numeration assumes at the S1 vertebral body is transitional. 6. Other imaging findings of potential clinical significance: Fibrosis/interstitial accentuation at the lung bases. Moderate cardiomegaly. Dual lead  pacer. Aortic and mitral valve calcification. Substantial atherosclerosis including coronary and systemic atherosclerosis. Lumbar scoliosis with rotary component.       Medications   Current Outpatient Medications  Medication Sig Dispense Refill   acetaminophen (TYLENOL) 325 MG tablet Take 650 mg by mouth every 6 (six) hours as needed.     amiodarone (PACERONE) 200 MG tablet Take '200mg'$  (1 tab) by mouth daily x 4 weeks, then decrease to '100mg'$  (1/2 tab) by mouth daily thereafter 90 tablet 3   amLODipine (NORVASC) 10 MG tablet Take 10 mg by mouth daily.     clonazePAM (KLONOPIN) 0.5 MG tablet Take 0.5 mg by mouth at bedtime. For restless leg syndrome     Elastic Bandages & Supports (MEDICAL COMPRESSION STOCKINGS) MISC 1 each by Does not apply route as directed. Knee Hi Compression Stockings to be measured prior to order-medium pressure Dx: Lower extremity edema & CHF 1 each 0   ELIQUIS 5 MG TABS tablet Take 5 mg by mouth 2 (two) times daily.     ezetimibe (ZETIA) 10 MG tablet Take 10 mg by mouth daily.     furosemide (LASIX) 40 MG tablet Take 40 mg by mouth daily.     HM CRANBERRY 500 MG TABS Take 1 tablet by mouth daily.     hydrocortisone (ANUSOL-HC) 2.5 % rectal cream Place 1 Application rectally 2 (two) times daily as needed for hemorrhoids or anal itching. 30 g 1   isosorbide mononitrate (IMDUR) 30 MG 24 hr tablet Take 30 mg by mouth every morning.      levothyroxine (SYNTHROID) 112 MCG tablet Take 112 mcg by mouth daily.     metoprolol tartrate (LOPRESSOR) 25 MG tablet Take 25  mg by mouth daily.     nitrofurantoin (MACRODANTIN) 50 MG capsule Take 50 mg by mouth at bedtime.     pantoprazole (PROTONIX) 40 MG tablet Take 40 mg by mouth daily.     polyethylene glycol powder (GLYCOLAX/MIRALAX) 17 GM/SCOOP powder Take 1/2 capfule daily for constipation. Hold for 24 hours if develops diarrhea. 527 g 11   No current facility-administered medications for this visit.    Allergies    Allergies as of 06/29/2022 - Review Complete 05/25/2022  Allergen Reaction Noted   Ciprofloxacin Other (See Comments) 07/31/2012   Hctz [hydrochlorothiazide] Other (See Comments) 07/31/2012   Lopid [gemfibrozil] Other (See Comments)      Past Medical History   Past Medical History:  Diagnosis Date   CAD (coronary artery disease)    Two vessel, quiescent; 70% LAD aretery disease and well collateralized, 100% RCA by cardiac cath in 2004; NL LVF; low risk Cardioloite; EF 74%, 11/10   Dyslipidemia    Hypercalcemia    Hypertension    Hypothyroidism    Paroxysmal atrial fibrillation (HCC)    on Eliquis and amiodarone   Sinus node dysfunction (HCC)    s/p Medtronic dual-chamber pacemaker   Vertigo     Past Surgical History   Past Surgical History:  Procedure Laterality Date   ABDOMINAL HYSTERECTOMY     FRACTURE SURGERY     PACEMAKER GENERATOR CHANGE N/A 02/12/2013   Procedure: PACEMAKER GENERATOR CHANGE;  Surgeon: Deboraha Sprang, MD;  Location: [facility] CATH LAB;  Service: Cardiovascular;  Laterality: N/A;   PACEMAKER INSERTION  05/24/03   MDT [facility]    Past Family History   Family History  Problem Relation Age of Onset   Stroke Father    Diabetes Sister    Hypertension Sister    Stroke Brother    Coronary artery disease Neg Hx    Colon cancer Neg Hx     Past Social History   Social History   Socioeconomic History   Marital status: Single    Spouse name: Not on file   Number of children: Not on file   Years of education: Not on file   Highest education level: Not on file  Occupational History   Not on file  Tobacco Use   Smoking status: Never   Smokeless tobacco: Never  Substance and Sexual Activity   Alcohol use: No   Drug use: No   Sexual activity: Not Currently  Other Topics Concern   Not on file  Social History Narrative   Not on file   Social Determinants of Health   Financial Resource Strain: Not on file  Food Insecurity: Not on file   Transportation Needs: Not on file  Physical Activity: Not on file  Stress: Not on file  Social Connections: Not on file  Intimate Partner Violence: Not on file    Review of Systems   General: Negative for anorexia, weight loss, fever, chills, fatigue, weakness. ENT: Negative for hoarseness, difficulty swallowing , nasal congestion. CV: Negative for chest pain, angina, palpitations, dyspnea on exertion, peripheral edema.  Respiratory: Negative for dyspnea at rest, dyspnea on exertion, cough, sputum, wheezing.  GI: See history of present illness. GU:  Negative for dysuria, hematuria, urinary incontinence, urinary frequency, nocturnal urination.  Endo: Negative for unusual weight change.     Physical Exam   There were no vitals taken for this visit.   General: Well-nourished, well-developed in no acute distress.  Eyes: No icterus. Mouth: Oropharyngeal mucosa moist and  pink , no lesions erythema or exudate. Lungs: Clear to auscultation bilaterally.  Heart: Regular rate and rhythm, no murmurs rubs or gallops.  Abdomen: Bowel sounds are normal, nontender, nondistended, no hepatosplenomegaly or masses,  no abdominal bruits or hernia , no rebound or guarding.  Rectal: ***  Extremities: No lower extremity edema. No clubbing or deformities. Neuro: Alert and oriented x 4   Skin: Warm and dry, no jaundice.   Psych: Alert and cooperative, normal mood and affect.  Labs   *** Imaging Studies   No results found.  Assessment     Rectal bleeding: suspect bleeding from hemorrhoid disease in setting of constipation. Cannot exclude underlying malignancy. At this time she is not interested in pursuing invasive testing given her age.    Constipation: add miralax daily instead of prn dose. Start off low dose and titrate as needed.    Splenic lesion:noted on CT in 05/2021, stable sub-cm lesion, likely benign but CT or MRI recommended in six months. Her PCP has already ordered this but patient  did not schedule yet. I am requesting she reach out to PCP to get scheduled since it is already in process.    PLAN    PLAN   ***   Laureen Ochs. Bobby Rumpf, [place], [facility]

## 2022-06-29 ENCOUNTER — Ambulatory Visit: Payer: Medicare Other | Admitting: Gastroenterology

## 2022-07-06 NOTE — Telephone Encounter (Signed)
No ans at [facility], will try back later

## 2022-07-07 DIAGNOSIS — R030 Elevated blood-pressure reading, without diagnosis of hypertension: Secondary | ICD-10-CM | POA: Diagnosis not present

## 2022-07-07 DIAGNOSIS — K5900 Constipation, unspecified: Secondary | ICD-10-CM | POA: Diagnosis not present

## 2022-07-07 DIAGNOSIS — K219 Gastro-esophageal reflux disease without esophagitis: Secondary | ICD-10-CM | POA: Diagnosis not present

## 2022-07-07 DIAGNOSIS — I482 Chronic atrial fibrillation, unspecified: Secondary | ICD-10-CM | POA: Diagnosis not present

## 2022-07-07 DIAGNOSIS — Z950 Presence of cardiac pacemaker: Secondary | ICD-10-CM | POA: Diagnosis not present

## 2022-07-07 DIAGNOSIS — L719 Rosacea, unspecified: Secondary | ICD-10-CM | POA: Diagnosis not present

## 2022-07-07 DIAGNOSIS — L209 Atopic dermatitis, unspecified: Secondary | ICD-10-CM | POA: Diagnosis not present

## 2022-07-07 DIAGNOSIS — K649 Unspecified hemorrhoids: Secondary | ICD-10-CM | POA: Diagnosis not present

## 2022-07-07 DIAGNOSIS — N390 Urinary tract infection, site not specified: Secondary | ICD-10-CM | POA: Diagnosis not present

## 2022-07-07 DIAGNOSIS — D649 Anemia, unspecified: Secondary | ICD-10-CM | POA: Diagnosis not present

## 2022-07-07 DIAGNOSIS — E782 Mixed hyperlipidemia: Secondary | ICD-10-CM | POA: Diagnosis not present

## 2022-07-07 DIAGNOSIS — D7389 Other diseases of spleen: Secondary | ICD-10-CM | POA: Diagnosis not present

## 2022-07-19 NOTE — Telephone Encounter (Signed)
Pt had labs completed at [facility] [place] on 07/07/2022. Lab results are in your box for review.

## 2022-07-20 ENCOUNTER — Ambulatory Visit: Payer: Medicare Other | Attending: Internal Medicine | Admitting: Cardiology

## 2022-07-20 ENCOUNTER — Encounter: Payer: Self-pay | Admitting: Cardiology

## 2022-07-20 ENCOUNTER — Ambulatory Visit (INDEPENDENT_AMBULATORY_CARE_PROVIDER_SITE_OTHER): Payer: Medicare Other

## 2022-07-20 VITALS — BP 114/56 | HR 77 | Ht 63.0 in | Wt 145.8 lb

## 2022-07-20 DIAGNOSIS — I25119 Atherosclerotic heart disease of native coronary artery with unspecified angina pectoris: Secondary | ICD-10-CM | POA: Diagnosis not present

## 2022-07-20 DIAGNOSIS — I480 Paroxysmal atrial fibrillation: Secondary | ICD-10-CM

## 2022-07-20 DIAGNOSIS — E782 Mixed hyperlipidemia: Secondary | ICD-10-CM

## 2022-07-20 DIAGNOSIS — I495 Sick sinus syndrome: Secondary | ICD-10-CM | POA: Diagnosis not present

## 2022-07-20 MED ORDER — METOPROLOL TARTRATE 25 MG PO TABS: 25.0000 mg | ORAL_TABLET | Freq: Two times a day (BID) | ORAL | 1 refills | Status: DC

## 2022-07-20 NOTE — Progress Notes (Signed)
Cardiology Office Note  Date: 07/20/2022   ID: Cyndee Giammarco, DOB 07-31-1926, MRN [id number]  PCP:  [facility]  Cardiologist:  Rozann Lesches, MD Electrophysiologist:  Thompson Grayer, MD   Chief Complaint  Patient presents with   Cardiac follow-up    History of Present Illness: Melanie [name] is a 88 y.o. female former patient of Dr. Bronson Ing now presenting to establish follow-up with me.  I reviewed her records and updated the chart.  Most recent visit was in September 2023 by Dr. Rayann Heman.  She resides at [facility], here with family member today.  Overall reports no major change in symptoms.  She does mention that her heart rate is elevated in the morning when she gets up based on pulse oximeter checks.  No definite sense of palpitations otherwise.  Medtronic pacemaker in place with follow-up by EP.  Device interrogation in July revealed normal function with approximately 11% AT/AF burden.  When she last saw Dr. Rayann Heman he recommended a small load and amiodarone dosing, however she was hesitant to make any changes and has remained on amiodarone 100 mg daily consistently.  Also on Eliquis for stroke prophylaxis.  I reviewed her interval lab work.  Medications reviewed.  It looks like she has actually been getting Lopressor 25 mg only once daily instead of twice daily, could be a reason her heart rate has been up in the mornings.  Past Medical History:  Diagnosis Date   CAD (coronary artery disease)    Two vessel, quiescent; 70% LAD aretery disease and well collateralized, 100% RCA by cardiac cath in 2004; NL LVF; low risk Cardioloite; EF 74%, 11/10   Dyslipidemia    Hypercalcemia    Hypertension    Hypothyroidism    Paroxysmal atrial fibrillation (HCC)    on Eliquis and amiodarone   Sinus node dysfunction (HCC)    s/p Medtronic dual-chamber pacemaker   Vertigo     Past Surgical History:  Procedure Laterality Date   ABDOMINAL HYSTERECTOMY     FRACTURE  SURGERY     PACEMAKER GENERATOR CHANGE N/A 02/12/2013   Procedure: PACEMAKER GENERATOR CHANGE;  Surgeon: Deboraha Sprang, MD;  Location: [facility] CATH LAB;  Service: Cardiovascular;  Laterality: N/A;   PACEMAKER INSERTION  05/24/03   MDT Kappa    Current Outpatient Medications  Medication Sig Dispense Refill   acetaminophen (TYLENOL) 325 MG tablet Take 650 mg by mouth every 6 (six) hours as needed.     amiodarone (PACERONE) 200 MG tablet Take 257m (1 tab) by mouth daily x 4 weeks, then decrease to 1063m(1/2 tab) by mouth daily thereafter 90 tablet 3   amLODipine (NORVASC) 10 MG tablet Take 10 mg by mouth daily.     clonazePAM (KLONOPIN) 0.5 MG tablet Take 0.5 mg by mouth at bedtime. For restless leg syndrome     Elastic Bandages & Supports (MEDICAL COMPRESSION STOCKINGS) MISC 1 each by Does not apply route as directed. Knee Hi Compression Stockings to be measured prior to order-medium pressure Dx: Lower extremity edema & CHF 1 each 0   ELIQUIS 5 MG TABS tablet Take 5 mg by mouth 2 (two) times daily.     ezetimibe (ZETIA) 10 MG tablet Take 10 mg by mouth daily.     furosemide (LASIX) 40 MG tablet Take 40 mg by mouth daily.     HM CRANBERRY 500 MG TABS Take 1 tablet by mouth daily.     hydrocortisone (ANUSOL-HC) 2.5 % rectal cream Place 1  Application rectally 2 (two) times daily as needed for hemorrhoids or anal itching. 30 g 1   isosorbide mononitrate (IMDUR) 30 MG 24 hr tablet Take 30 mg by mouth every morning.      levothyroxine (SYNTHROID) 112 MCG tablet Take 112 mcg by mouth daily.     nitrofurantoin (MACRODANTIN) 50 MG capsule Take 50 mg by mouth at bedtime.     pantoprazole (PROTONIX) 40 MG tablet Take 40 mg by mouth daily.     polyethylene glycol powder (GLYCOLAX/MIRALAX) 17 GM/SCOOP powder Take 1/2 capfule daily for constipation. Hold for 24 hours if develops diarrhea. 527 g 11   metoprolol tartrate (LOPRESSOR) 25 MG tablet Take 1 tablet (25 mg total) by mouth 2 (two) times daily. 180 tablet  1   No current facility-administered medications for this visit.   Allergies:  Ciprofloxacin, Hctz [hydrochlorothiazide], and Lopid [gemfibrozil]   ROS: No orthopnea or PND.  Physical Exam: VS:  BP (!) 114/56   Pulse 77   Ht 5' 3" (1.6 m)   Wt 145 lb 12.8 oz (66.1 kg)   SpO2 95%   BMI 25.83 kg/m , BMI Body mass index is 25.83 kg/m.  Wt Readings from Last 3 Encounters:  07/20/22 145 lb 12.8 oz (66.1 kg)  04/27/22 147 lb 12.8 oz (67 kg)  04/02/22 152 lb 12.8 oz (69.3 kg)    General: Patient appears comfortable at rest. HEENT: Conjunctiva and lids normal. Neck: Supple, no elevated JVP or carotid bruits. Lungs: Clear to auscultation, nonlabored breathing at rest. Cardiac: Regular rate and rhythm, no S3, 2/6 systolic murmur. Extremities: No pitting edema.  ECG:  An ECG dated 04/02/2022 was personally reviewed today and demonstrated:  Atrial paced rhythm with nonspecific T wave changes.  Recent Labwork: July 2023: Hemoglobin 11.2, platelets 142, high-sensitivity troponin I 12, potassium 3.6, BUN 17, creatinine 1.28 with EGFR 39, AST 18, ALT 17 05/25/2022: Creatinine, Ser 1.50   Other Studies Reviewed Today:  Chest x-ray 06/15/2022: FINDINGS:  Cardiac shadow is stable. Pacing device is again seen. Fullness in  the right paratracheal region is again identified consistent with  tortuous vascularity. Lungs are well aerated with mild vascular  congestion. No sizable effusion or focal infiltrate is noted.   IMPRESSION:  Mild vascular congestion without significant edema.   No new focal infiltrate is seen.   Assessment and Plan:  1.  Paroxysmal atrial fibrillation with CHA2DS2-VASc score of 5.  She is on Eliquis for stroke prophylaxis, interval lab work reviewed, still appropriate for 5 mg twice daily dose.  She had approximately 11% rhythm burden by last device interrogation and remains on amiodarone 100 mg daily for now.  I notice that Lopressor is only been given at 25 mg once  daily, this will be changed to twice daily (may also help with elevated heart rate in the mornings).  Check TSH, CBC, and CMET for next visit.  2.  Sinus node dysfunction with Medtronic pacemaker in place.  Establishing follow-up with Dr. Myles Gip.  3.  Two-vessel obstructive CAD by remote cardiac catheterization, medical therapy continues in the absence of accelerating angina.  She is not on aspirin given use of Eliquis.  Continue Imdur, Lopressor, and Zetia.  Medication Adjustments/Labs and Tests Ordered: Current medicines are reviewed at length with the patient today.  Concerns regarding medicines are outlined above.   Tests Ordered: Orders Placed This Encounter  Procedures   CBC   Comprehensive metabolic panel   TSH    Medication Changes: Meds ordered  this encounter  Medications   metoprolol tartrate (LOPRESSOR) 25 MG tablet    Sig: Take 1 tablet (25 mg total) by mouth 2 (two) times daily.    Dispense:  180 tablet    Refill:  1    07/20/22 Dose Increase    Disposition:  Follow up  6 months.  Signed, Satira Sark, MD, [facility] 07/20/2022 2:28 PM    [facility] [place], [place], [place] [address] Phone: [phone]; Fax: [phone]

## 2022-07-20 NOTE — Patient Instructions (Addendum)
Medication Instructions:  Your physician has recommended you make the following change in your medication:  Start lopressor 25 mg twice a day Continue amiodarone 200 mg once a day Continue all other medications as directed  Labwork: CBC. CMET, TSH (at [facility] before 6 months appointment)  Testing/Procedures: none  Follow-Up:  Your physician recommends that you schedule a follow-up appointment in: 6 months  Any Other Special Instructions Will Be Listed Below (If Applicable).  If you need a refill on your cardiac medications before your next appointment, please call your pharmacy.

## 2022-07-21 ENCOUNTER — Telehealth: Payer: Self-pay

## 2022-07-21 DIAGNOSIS — G894 Chronic pain syndrome: Secondary | ICD-10-CM | POA: Diagnosis not present

## 2022-07-21 DIAGNOSIS — M6281 Muscle weakness (generalized): Secondary | ICD-10-CM | POA: Diagnosis not present

## 2022-07-21 NOTE — Telephone Encounter (Signed)
Error

## 2022-07-21 NOTE — Telephone Encounter (Signed)
Labs completed July 07, 2022: Folate 13.8, B12 253, ferritin 31, iron 52, TIBC 285, iron saturations 18%, white blood cell count 5700, hemoglobin 10.7 (10.3 in June 2023), MCV 88, platelets 193,000, creatinine 1.41 stable, total bilirubin 0.3, alkaline phosphatase 96, AST 15, ALT 9, albumin 4.1, TSH 1.420  Labs stable.  Patient no showed appointment.  OV prn.

## 2022-07-22 LAB — CUP PACEART REMOTE DEVICE CHECK
Battery Impedance: 1121 Ohm
Battery Remaining Longevity: 58 mo
Battery Voltage: 2.78 V
Brady Statistic AP VP Percent: 1 %
Brady Statistic AP VS Percent: 66 %
Brady Statistic AS VP Percent: 7 %
Brady Statistic AS VS Percent: 26 %
Date Time Interrogation Session: 20231025161335
Implantable Lead Connection Status: 753985
Implantable Lead Connection Status: 753985
Implantable Lead Implant Date: 20040827
Implantable Lead Implant Date: 20040827
Implantable Lead Location: 753859
Implantable Lead Location: 753860
Implantable Lead Model: 4092
Implantable Pulse Generator Implant Date: 20140519
Lead Channel Impedance Value: 436 Ohm
Lead Channel Impedance Value: 719 Ohm
Lead Channel Pacing Threshold Amplitude: 0.625 V
Lead Channel Pacing Threshold Amplitude: 1.5 V
Lead Channel Pacing Threshold Pulse Width: 0.4 ms
Lead Channel Pacing Threshold Pulse Width: 0.4 ms
Lead Channel Setting Pacing Amplitude: 2 V
Lead Channel Setting Pacing Amplitude: 2.5 V
Lead Channel Setting Pacing Pulse Width: 0.4 ms
Lead Channel Setting Sensing Sensitivity: 4 mV
Zone Setting Status: 755011
Zone Setting Status: 755011

## 2022-07-22 NOTE — Telephone Encounter (Signed)
Melanie [name] at [facility] was made aware.

## 2022-07-27 ENCOUNTER — Ambulatory Visit: Payer: Medicare Other | Admitting: Internal Medicine

## 2022-07-27 DIAGNOSIS — E039 Hypothyroidism, unspecified: Secondary | ICD-10-CM | POA: Diagnosis not present

## 2022-07-27 DIAGNOSIS — I10 Essential (primary) hypertension: Secondary | ICD-10-CM | POA: Diagnosis not present

## 2022-08-02 DIAGNOSIS — R195 Other fecal abnormalities: Secondary | ICD-10-CM | POA: Diagnosis not present

## 2022-08-02 DIAGNOSIS — Z950 Presence of cardiac pacemaker: Secondary | ICD-10-CM | POA: Diagnosis not present

## 2022-08-02 DIAGNOSIS — N179 Acute kidney failure, unspecified: Secondary | ICD-10-CM | POA: Diagnosis not present

## 2022-08-02 DIAGNOSIS — E785 Hyperlipidemia, unspecified: Secondary | ICD-10-CM | POA: Diagnosis not present

## 2022-08-02 DIAGNOSIS — I2510 Atherosclerotic heart disease of native coronary artery without angina pectoris: Secondary | ICD-10-CM | POA: Diagnosis not present

## 2022-08-02 DIAGNOSIS — R9431 Abnormal electrocardiogram [ECG] [EKG]: Secondary | ICD-10-CM | POA: Diagnosis not present

## 2022-08-02 DIAGNOSIS — K921 Melena: Secondary | ICD-10-CM | POA: Diagnosis not present

## 2022-08-02 DIAGNOSIS — Z1152 Encounter for screening for COVID-19: Secondary | ICD-10-CM | POA: Diagnosis not present

## 2022-08-02 DIAGNOSIS — D649 Anemia, unspecified: Secondary | ICD-10-CM | POA: Diagnosis not present

## 2022-08-02 DIAGNOSIS — R531 Weakness: Secondary | ICD-10-CM | POA: Diagnosis not present

## 2022-08-02 DIAGNOSIS — E079 Disorder of thyroid, unspecified: Secondary | ICD-10-CM | POA: Diagnosis not present

## 2022-08-02 DIAGNOSIS — I10 Essential (primary) hypertension: Secondary | ICD-10-CM | POA: Diagnosis not present

## 2022-08-02 DIAGNOSIS — R54 Age-related physical debility: Secondary | ICD-10-CM | POA: Diagnosis not present

## 2022-08-02 DIAGNOSIS — I4510 Unspecified right bundle-branch block: Secondary | ICD-10-CM | POA: Diagnosis not present

## 2022-08-02 DIAGNOSIS — I4891 Unspecified atrial fibrillation: Secondary | ICD-10-CM | POA: Diagnosis not present

## 2022-08-02 DIAGNOSIS — Z9181 History of falling: Secondary | ICD-10-CM | POA: Diagnosis not present

## 2022-08-02 DIAGNOSIS — T45515A Adverse effect of anticoagulants, initial encounter: Secondary | ICD-10-CM | POA: Diagnosis not present

## 2022-08-02 DIAGNOSIS — E119 Type 2 diabetes mellitus without complications: Secondary | ICD-10-CM | POA: Diagnosis not present

## 2022-08-02 DIAGNOSIS — Z7409 Other reduced mobility: Secondary | ICD-10-CM | POA: Diagnosis not present

## 2022-08-02 DIAGNOSIS — R269 Unspecified abnormalities of gait and mobility: Secondary | ICD-10-CM | POA: Diagnosis not present

## 2022-08-02 DIAGNOSIS — D6832 Hemorrhagic disorder due to extrinsic circulating anticoagulants: Secondary | ICD-10-CM | POA: Diagnosis not present

## 2022-08-02 DIAGNOSIS — Z79899 Other long term (current) drug therapy: Secondary | ICD-10-CM | POA: Diagnosis not present

## 2022-08-02 DIAGNOSIS — Z7901 Long term (current) use of anticoagulants: Secondary | ICD-10-CM | POA: Diagnosis not present

## 2022-08-02 DIAGNOSIS — R0902 Hypoxemia: Secondary | ICD-10-CM | POA: Diagnosis not present

## 2022-08-02 DIAGNOSIS — K5900 Constipation, unspecified: Secondary | ICD-10-CM | POA: Diagnosis not present

## 2022-08-03 DIAGNOSIS — K921 Melena: Secondary | ICD-10-CM | POA: Diagnosis not present

## 2022-08-03 DIAGNOSIS — I4891 Unspecified atrial fibrillation: Secondary | ICD-10-CM | POA: Diagnosis not present

## 2022-08-03 DIAGNOSIS — Z950 Presence of cardiac pacemaker: Secondary | ICD-10-CM | POA: Diagnosis not present

## 2022-08-03 DIAGNOSIS — I10 Essential (primary) hypertension: Secondary | ICD-10-CM | POA: Diagnosis not present

## 2022-08-03 DIAGNOSIS — Z79899 Other long term (current) drug therapy: Secondary | ICD-10-CM | POA: Diagnosis not present

## 2022-08-03 DIAGNOSIS — K5900 Constipation, unspecified: Secondary | ICD-10-CM | POA: Diagnosis not present

## 2022-08-03 DIAGNOSIS — E119 Type 2 diabetes mellitus without complications: Secondary | ICD-10-CM | POA: Diagnosis not present

## 2022-08-03 DIAGNOSIS — E0500 Thyrotoxicosis with diffuse goiter without thyrotoxic crisis or storm: Secondary | ICD-10-CM | POA: Diagnosis not present

## 2022-08-03 DIAGNOSIS — N179 Acute kidney failure, unspecified: Secondary | ICD-10-CM | POA: Diagnosis not present

## 2022-08-03 DIAGNOSIS — R54 Age-related physical debility: Secondary | ICD-10-CM | POA: Diagnosis not present

## 2022-08-04 DIAGNOSIS — I119 Hypertensive heart disease without heart failure: Secondary | ICD-10-CM | POA: Diagnosis not present

## 2022-08-04 DIAGNOSIS — I2510 Atherosclerotic heart disease of native coronary artery without angina pectoris: Secondary | ICD-10-CM | POA: Diagnosis not present

## 2022-08-04 DIAGNOSIS — K921 Melena: Secondary | ICD-10-CM | POA: Diagnosis not present

## 2022-08-04 DIAGNOSIS — I4891 Unspecified atrial fibrillation: Secondary | ICD-10-CM | POA: Diagnosis not present

## 2022-08-04 DIAGNOSIS — E119 Type 2 diabetes mellitus without complications: Secondary | ICD-10-CM | POA: Diagnosis not present

## 2022-08-09 NOTE — Progress Notes (Signed)
Remote pacemaker transmission.   

## 2022-08-10 DIAGNOSIS — I4891 Unspecified atrial fibrillation: Secondary | ICD-10-CM | POA: Diagnosis not present

## 2022-08-10 DIAGNOSIS — E119 Type 2 diabetes mellitus without complications: Secondary | ICD-10-CM | POA: Diagnosis not present

## 2022-08-10 DIAGNOSIS — I2510 Atherosclerotic heart disease of native coronary artery without angina pectoris: Secondary | ICD-10-CM | POA: Diagnosis not present

## 2022-08-10 DIAGNOSIS — K921 Melena: Secondary | ICD-10-CM | POA: Diagnosis not present

## 2022-08-10 DIAGNOSIS — I119 Hypertensive heart disease without heart failure: Secondary | ICD-10-CM | POA: Diagnosis not present

## 2022-08-12 DIAGNOSIS — I4891 Unspecified atrial fibrillation: Secondary | ICD-10-CM | POA: Diagnosis not present

## 2022-08-12 DIAGNOSIS — E119 Type 2 diabetes mellitus without complications: Secondary | ICD-10-CM | POA: Diagnosis not present

## 2022-08-12 DIAGNOSIS — I119 Hypertensive heart disease without heart failure: Secondary | ICD-10-CM | POA: Diagnosis not present

## 2022-08-12 DIAGNOSIS — I2510 Atherosclerotic heart disease of native coronary artery without angina pectoris: Secondary | ICD-10-CM | POA: Diagnosis not present

## 2022-08-12 DIAGNOSIS — K921 Melena: Secondary | ICD-10-CM | POA: Diagnosis not present

## 2022-08-16 DIAGNOSIS — K921 Melena: Secondary | ICD-10-CM | POA: Diagnosis not present

## 2022-08-16 DIAGNOSIS — I119 Hypertensive heart disease without heart failure: Secondary | ICD-10-CM | POA: Diagnosis not present

## 2022-08-16 DIAGNOSIS — I4891 Unspecified atrial fibrillation: Secondary | ICD-10-CM | POA: Diagnosis not present

## 2022-08-16 DIAGNOSIS — I2510 Atherosclerotic heart disease of native coronary artery without angina pectoris: Secondary | ICD-10-CM | POA: Diagnosis not present

## 2022-08-16 DIAGNOSIS — E119 Type 2 diabetes mellitus without complications: Secondary | ICD-10-CM | POA: Diagnosis not present

## 2022-08-17 DIAGNOSIS — G894 Chronic pain syndrome: Secondary | ICD-10-CM | POA: Diagnosis not present

## 2022-08-17 DIAGNOSIS — M6281 Muscle weakness (generalized): Secondary | ICD-10-CM | POA: Diagnosis not present

## 2022-08-18 DIAGNOSIS — I2510 Atherosclerotic heart disease of native coronary artery without angina pectoris: Secondary | ICD-10-CM | POA: Diagnosis not present

## 2022-08-18 DIAGNOSIS — I4891 Unspecified atrial fibrillation: Secondary | ICD-10-CM | POA: Diagnosis not present

## 2022-08-18 DIAGNOSIS — I119 Hypertensive heart disease without heart failure: Secondary | ICD-10-CM | POA: Diagnosis not present

## 2022-08-18 DIAGNOSIS — E119 Type 2 diabetes mellitus without complications: Secondary | ICD-10-CM | POA: Diagnosis not present

## 2022-08-18 DIAGNOSIS — K921 Melena: Secondary | ICD-10-CM | POA: Diagnosis not present

## 2022-08-23 DIAGNOSIS — I4891 Unspecified atrial fibrillation: Secondary | ICD-10-CM | POA: Diagnosis not present

## 2022-08-23 DIAGNOSIS — I119 Hypertensive heart disease without heart failure: Secondary | ICD-10-CM | POA: Diagnosis not present

## 2022-08-23 DIAGNOSIS — E119 Type 2 diabetes mellitus without complications: Secondary | ICD-10-CM | POA: Diagnosis not present

## 2022-08-23 DIAGNOSIS — I2510 Atherosclerotic heart disease of native coronary artery without angina pectoris: Secondary | ICD-10-CM | POA: Diagnosis not present

## 2022-08-23 DIAGNOSIS — K921 Melena: Secondary | ICD-10-CM | POA: Diagnosis not present

## 2022-08-25 DIAGNOSIS — I119 Hypertensive heart disease without heart failure: Secondary | ICD-10-CM | POA: Diagnosis not present

## 2022-08-25 DIAGNOSIS — E119 Type 2 diabetes mellitus without complications: Secondary | ICD-10-CM | POA: Diagnosis not present

## 2022-08-25 DIAGNOSIS — I4891 Unspecified atrial fibrillation: Secondary | ICD-10-CM | POA: Diagnosis not present

## 2022-08-25 DIAGNOSIS — K921 Melena: Secondary | ICD-10-CM | POA: Diagnosis not present

## 2022-08-25 DIAGNOSIS — I2510 Atherosclerotic heart disease of native coronary artery without angina pectoris: Secondary | ICD-10-CM | POA: Diagnosis not present

## 2022-08-27 DIAGNOSIS — K921 Melena: Secondary | ICD-10-CM | POA: Diagnosis not present

## 2022-08-27 DIAGNOSIS — E039 Hypothyroidism, unspecified: Secondary | ICD-10-CM | POA: Diagnosis not present

## 2022-08-27 DIAGNOSIS — I700 Atherosclerosis of aorta: Secondary | ICD-10-CM | POA: Diagnosis not present

## 2022-08-27 DIAGNOSIS — I4891 Unspecified atrial fibrillation: Secondary | ICD-10-CM | POA: Diagnosis not present

## 2022-08-27 DIAGNOSIS — I2510 Atherosclerotic heart disease of native coronary artery without angina pectoris: Secondary | ICD-10-CM | POA: Diagnosis not present

## 2022-08-27 DIAGNOSIS — I119 Hypertensive heart disease without heart failure: Secondary | ICD-10-CM | POA: Diagnosis not present

## 2022-08-27 DIAGNOSIS — E119 Type 2 diabetes mellitus without complications: Secondary | ICD-10-CM | POA: Diagnosis not present

## 2022-08-30 DIAGNOSIS — I4891 Unspecified atrial fibrillation: Secondary | ICD-10-CM | POA: Diagnosis not present

## 2022-08-30 DIAGNOSIS — I2510 Atherosclerotic heart disease of native coronary artery without angina pectoris: Secondary | ICD-10-CM | POA: Diagnosis not present

## 2022-08-30 DIAGNOSIS — K921 Melena: Secondary | ICD-10-CM | POA: Diagnosis not present

## 2022-08-30 DIAGNOSIS — I119 Hypertensive heart disease without heart failure: Secondary | ICD-10-CM | POA: Diagnosis not present

## 2022-08-30 DIAGNOSIS — E119 Type 2 diabetes mellitus without complications: Secondary | ICD-10-CM | POA: Diagnosis not present

## 2022-09-01 ENCOUNTER — Other Ambulatory Visit: Payer: Self-pay

## 2022-09-01 ENCOUNTER — Emergency Department (HOSPITAL_COMMUNITY)
Admission: EM | Admit: 2022-09-01 | Discharge: 2022-09-01 | Disposition: A | Discharge status: Home / Self Care | Payer: Medicare Other | Attending: Emergency Medicine | Admitting: Emergency Medicine

## 2022-09-01 ENCOUNTER — Emergency Department (HOSPITAL_COMMUNITY): Payer: Medicare Other

## 2022-09-01 ENCOUNTER — Encounter (HOSPITAL_COMMUNITY): Payer: Self-pay

## 2022-09-01 DIAGNOSIS — Z79899 Other long term (current) drug therapy: Secondary | ICD-10-CM | POA: Insufficient documentation

## 2022-09-01 DIAGNOSIS — M436 Torticollis: Secondary | ICD-10-CM | POA: Insufficient documentation

## 2022-09-01 DIAGNOSIS — R531 Weakness: Secondary | ICD-10-CM | POA: Diagnosis not present

## 2022-09-01 DIAGNOSIS — I10 Essential (primary) hypertension: Secondary | ICD-10-CM | POA: Insufficient documentation

## 2022-09-01 DIAGNOSIS — Z7901 Long term (current) use of anticoagulants: Secondary | ICD-10-CM | POA: Insufficient documentation

## 2022-09-01 DIAGNOSIS — I2510 Atherosclerotic heart disease of native coronary artery without angina pectoris: Secondary | ICD-10-CM | POA: Insufficient documentation

## 2022-09-01 DIAGNOSIS — M542 Cervicalgia: Secondary | ICD-10-CM | POA: Diagnosis present

## 2022-09-01 DIAGNOSIS — Z8616 Personal history of COVID-19: Secondary | ICD-10-CM | POA: Insufficient documentation

## 2022-09-01 DIAGNOSIS — M4312 Spondylolisthesis, cervical region: Secondary | ICD-10-CM | POA: Diagnosis not present

## 2022-09-01 MED ORDER — TRAMADOL HCL 50 MG PO TABS: 50.0000 mg | ORAL_TABLET | Freq: Once | ORAL | Status: DC

## 2022-09-01 MED ORDER — LIDOCAINE 5 % EX PTCH: 1.0000 | MEDICATED_PATCH | CUTANEOUS | 0 refills | Status: DC

## 2022-09-01 MED ORDER — LIDOCAINE 5 % EX PTCH
1.0000 | MEDICATED_PATCH | CUTANEOUS | Status: DC
Start: 2022-09-01 — End: 2022-09-02
  Administered 2022-09-01: 1 via TRANSDERMAL
  Filled 2022-09-01: qty 1

## 2022-09-01 MED ORDER — ACETAMINOPHEN 500 MG PO TABS
1000.0000 mg | ORAL_TABLET | Freq: Once | ORAL | Status: AC
  Administered 2022-09-01: 1000 mg via ORAL
  Filled 2022-09-01: qty 2

## 2022-09-01 MED ORDER — DICLOFENAC SODIUM 1 % EX GEL: 2.0000 g | Freq: Four times a day (QID) | CUTANEOUS | 1 refills | Status: DC

## 2022-09-01 MED ORDER — ACETAMINOPHEN ER 650 MG PO TBCR: 650.0000 mg | EXTENDED_RELEASE_TABLET | Freq: Four times a day (QID) | ORAL | 0 refills | Status: DC

## 2022-09-01 NOTE — Discharge Instructions (Signed)
Your x-rays are okay today, I suspect your symptoms are related to this muscle spasm in your neck.  You are being prescribed a topical pain reliever which is an anti-inflammatory to rub into this area 4 times daily.  Additionally, you may apply the pain patch daily which we applied here, have also sent a prescription for this if you find that it helps relieve your symptoms.  You may also benefit from alternating ice and heat on your neck.  I recommend 10 minutes with ice intermittently as much is as comfortable throughout the day.  Applying a heating pad to this area can also help relax the muscle, this can be applied for 20 minutes 3-4 times daily.

## 2022-09-01 NOTE — ED Triage Notes (Signed)
Pt presents to ED with complaints of neck pain x 3 weeks. Pt states she went to PT and was doing exercises has been having pain since. Pt states hurts worse with laying down.

## 2022-09-01 NOTE — ED Provider Notes (Signed)
[facility] EMERGENCY DEPARTMENT Provider Note   CSN: [id number] Arrival date & time: 09/01/22  1541     History  Chief Complaint  Patient presents with   Neck Pain    Melanie [name] is a 88 y.o. female with a history of including paroxysmal A-fib, hypertension, hyperlipidemia, CAD and history of vertigo was diagnosed with COVID-19 several weeks ago which caused generalized weakness.  For this reason for provider had ordered general physical therapy for conditioning which she has been receiving at her nursing facility.  She states that since she started the PT she has been having increasing pain in the left side of her neck along with difficulty with movement, she denies specific injury but has had some neck manipulation during her PT sessions.  She denies weakness or numbness in her extremities.  She has been given Tylenol with no significant improvement in her symptoms.  No fevers or chills.  Pain is worse with movement, particularly with head rotation over her left shoulder.  The history is provided by the patient.       Home Medications Prior to Admission medications   Medication Sig Start Date End Date Taking? Authorizing Provider  amiodarone (PACERONE) 200 MG tablet Take '200mg'$  (1 tab) by mouth daily x 4 weeks, then decrease to '100mg'$  (1/2 tab) by mouth daily thereafter Patient taking differently: Take 100 mg by mouth daily. 04/02/22  Yes Allred, Jeneen Rinks, MD  amLODipine (NORVASC) 10 MG tablet Take 10 mg by mouth daily. 12/25/18  Yes [provider]  clonazePAM (KLONOPIN) 0.5 MG tablet Take 0.5 mg by mouth at bedtime. For restless leg syndrome   Yes [provider]  diclofenac Sodium (VOLTAREN ARTHRITIS PAIN) 1 % GEL Apply 2 g topically 4 (four) times daily. 09/01/22  Yes Elisea Khader, Almyra Free, PA-C  lidocaine (LIDODERM) 5 % Place 1 patch onto the skin daily. Remove & Discard patch within 12 hours or as directed by MD 09/01/22  Yes Rupa Lagan, Almyra Free, PA-C  acetaminophen (TYLENOL) 325 MG  tablet Take 650 mg by mouth every 6 (six) hours as needed.    [provider]  Elastic Bandages & Supports (Section) [place] 1 each by Does not apply route as directed. Knee Hi Compression Stockings to be measured prior to order-medium pressure Dx: Lower extremity edema & CHF 01/08/19   Daune Perch, NP  ELIQUIS 5 MG TABS tablet Take 5 mg by mouth 2 (two) times daily. 03/29/22   [provider]  ezetimibe (ZETIA) 10 MG tablet Take 10 mg by mouth daily.    [provider]  furosemide (LASIX) 40 MG tablet Take 40 mg by mouth daily. 12/27/18   [provider]  HM CRANBERRY 500 MG TABS Take 1 tablet by mouth daily. 11/09/19   [provider]  hydrocortisone (ANUSOL-HC) 2.5 % rectal cream Place 1 Application rectally 2 (two) times daily as needed for hemorrhoids or anal itching. 04/27/22   Mahala Menghini, PA-C  isosorbide mononitrate (IMDUR) 30 MG 24 hr tablet Take 30 mg by mouth every morning.     [provider]  levothyroxine (SYNTHROID) 112 MCG tablet Take 112 mcg by mouth daily. 11/21/19   [provider]  metoprolol tartrate (LOPRESSOR) 25 MG tablet Take 1 tablet (25 mg total) by mouth 2 (two) times daily. 07/20/22   Satira Sark, MD  nitrofurantoin (MACRODANTIN) 50 MG capsule Take 50 mg by mouth at bedtime. 03/29/22   [provider]  pantoprazole (PROTONIX) 40 MG tablet Take  40 mg by mouth daily. 11/21/19   [provider]  polyethylene glycol powder (GLYCOLAX/MIRALAX) 17 GM/SCOOP powder Take 1/2 capfule daily for constipation. Hold for 24 hours if develops diarrhea. 04/27/22   Mahala Menghini, PA-C      Allergies    Ciprofloxacin, Hctz [hydrochlorothiazide], and Lopid [gemfibrozil]    Review of Systems   Review of Systems  Constitutional:  Negative for fever.  Musculoskeletal:  Positive for arthralgias and neck pain. Negative for joint swelling and myalgias.  Neurological:  Negative for weakness  and numbness.  All other systems reviewed and are negative.   Physical Exam Updated Vital Signs BP (!) 125/54   Pulse 75   Temp 97.9 F (36.6 C) (Oral)   Resp 16   Ht '5\' 3"'$  (1.6 m)   Wt 59 kg   SpO2 97%   BMI 23.03 kg/m  Physical Exam Constitutional:      Appearance: She is well-developed.  HENT:     Head: Atraumatic.  Cardiovascular:     Comments: Pulses equal bilaterally Musculoskeletal:        General: Tenderness present.     Cervical back: Spasms, torticollis and tenderness present. No deformity. Decreased range of motion.     Comments: Patient has significant tenderness along with spasm along her left trapezius.  She has no midline cervical tenderness, no palpable deformity or edema.  Equal grip strength.  No decrease sensation in her upper extremities.  Skin:    General: Skin is warm and dry.  Neurological:     Mental Status: She is alert.     Sensory: No sensory deficit.     Motor: No weakness.     Deep Tendon Reflexes: Reflexes normal.     ED Results / Procedures / Treatments   Labs (all labs ordered are listed, but only abnormal results are displayed) Labs Reviewed - No data to display  EKG None  Radiology CT Cervical Spine Wo Contrast  Result Date: 09/01/2022 CLINICAL DATA:  Neck trauma. EXAM: CT CERVICAL SPINE WITHOUT CONTRAST TECHNIQUE: Multidetector CT imaging of the cervical spine was performed without intravenous contrast. Multiplanar CT image reconstructions were also generated. RADIATION DOSE REDUCTION: This exam was performed according to the departmental dose-optimization program which includes automated exposure control, adjustment of the mA and/or kV according to patient size and/or use of iterative reconstruction technique. COMPARISON:  None Available. FINDINGS: Alignment: Trace anterolisthesis of C4 on C5. Mild retrolisthesis of C5 on C6. Trace anterolisthesis of C7 on T1. Skull base and vertebrae: Incomplete fusion of the posterior elements of  C1. Soft tissues and spinal canal: No prevertebral fluid or swelling. No visible canal hematoma. Disc levels:  Evidence of high-grade spinal canal stenosis. Upper chest: Negative. Other: Polypoid mucosal thickening right maxillary sinus. IMPRESSION: No evidence of acute fracture or traumatic subluxation. Electronically Signed   By: Marin Roberts M.D.   On: 09/01/2022 17:54    Procedures Procedures    Medications Ordered in ED Medications  lidocaine (LIDODERM) 5 % 1 patch (1 patch Transdermal Patch Applied 09/01/22 1817)    ED Course/ Medical Decision Making/ A&P                           Medical Decision Making Given age and comorbidities, patient underwent CT imaging of her C-spine to rule out acute injury, fracture, subluxation.  This is negative for acute injury.  She has no neurologic deficits on exam.  Her exam is  consistent with acute torticollis and muscle spasm.  She will be treated with anti-inflammatories, topical along with Lidoderm patches, also discussing role of heat and ice.  Plan follow-up with their primary provider if symptoms are not improving over the next week.  Amount and/or Complexity of Data Reviewed Radiology: ordered.    Details: Reviewed and discussed with patient.  Risk Prescription drug management.           Final Clinical Impression(s) / ED Diagnoses Final diagnoses:  Torticollis, acute    Rx / DC Orders ED Discharge Orders          Ordered    diclofenac Sodium (VOLTAREN ARTHRITIS PAIN) 1 % GEL  4 times daily        09/01/22 1849    lidocaine (LIDODERM) 5 %  Every 24 hours        09/01/22 1849              Evalee Jefferson, Hershal Coria 09/01/22 1851    Noemi Chapel, MD 09/02/22 1520

## 2022-09-06 DIAGNOSIS — K921 Melena: Secondary | ICD-10-CM | POA: Diagnosis not present

## 2022-09-06 DIAGNOSIS — I4891 Unspecified atrial fibrillation: Secondary | ICD-10-CM | POA: Diagnosis not present

## 2022-09-06 DIAGNOSIS — I2510 Atherosclerotic heart disease of native coronary artery without angina pectoris: Secondary | ICD-10-CM | POA: Diagnosis not present

## 2022-09-06 DIAGNOSIS — I119 Hypertensive heart disease without heart failure: Secondary | ICD-10-CM | POA: Diagnosis not present

## 2022-09-06 DIAGNOSIS — E119 Type 2 diabetes mellitus without complications: Secondary | ICD-10-CM | POA: Diagnosis not present

## 2022-09-13 DIAGNOSIS — I2510 Atherosclerotic heart disease of native coronary artery without angina pectoris: Secondary | ICD-10-CM | POA: Diagnosis not present

## 2022-09-13 DIAGNOSIS — K921 Melena: Secondary | ICD-10-CM | POA: Diagnosis not present

## 2022-09-13 DIAGNOSIS — E119 Type 2 diabetes mellitus without complications: Secondary | ICD-10-CM | POA: Diagnosis not present

## 2022-09-13 DIAGNOSIS — I119 Hypertensive heart disease without heart failure: Secondary | ICD-10-CM | POA: Diagnosis not present

## 2022-09-13 DIAGNOSIS — I4891 Unspecified atrial fibrillation: Secondary | ICD-10-CM | POA: Diagnosis not present

## 2022-09-16 DIAGNOSIS — G894 Chronic pain syndrome: Secondary | ICD-10-CM | POA: Diagnosis not present

## 2022-09-16 DIAGNOSIS — M6281 Muscle weakness (generalized): Secondary | ICD-10-CM | POA: Diagnosis not present

## 2022-09-30 DIAGNOSIS — Z7401 Bed confinement status: Secondary | ICD-10-CM | POA: Diagnosis not present

## 2022-09-30 DIAGNOSIS — Z20822 Contact with and (suspected) exposure to covid-19: Secondary | ICD-10-CM | POA: Diagnosis not present

## 2022-09-30 DIAGNOSIS — I4891 Unspecified atrial fibrillation: Secondary | ICD-10-CM | POA: Diagnosis not present

## 2022-09-30 DIAGNOSIS — R1111 Vomiting without nausea: Secondary | ICD-10-CM | POA: Diagnosis not present

## 2022-09-30 DIAGNOSIS — R110 Nausea: Secondary | ICD-10-CM | POA: Diagnosis not present

## 2022-09-30 DIAGNOSIS — K529 Noninfective gastroenteritis and colitis, unspecified: Secondary | ICD-10-CM | POA: Diagnosis not present

## 2022-09-30 DIAGNOSIS — E119 Type 2 diabetes mellitus without complications: Secondary | ICD-10-CM | POA: Diagnosis not present

## 2022-09-30 DIAGNOSIS — Z1152 Encounter for screening for COVID-19: Secondary | ICD-10-CM | POA: Diagnosis not present

## 2022-09-30 DIAGNOSIS — Z881 Allergy status to other antibiotic agents status: Secondary | ICD-10-CM | POA: Diagnosis not present

## 2022-09-30 DIAGNOSIS — I10 Essential (primary) hypertension: Secondary | ICD-10-CM | POA: Diagnosis not present

## 2022-09-30 DIAGNOSIS — Z743 Need for continuous supervision: Secondary | ICD-10-CM | POA: Diagnosis not present

## 2022-09-30 DIAGNOSIS — E039 Hypothyroidism, unspecified: Secondary | ICD-10-CM | POA: Diagnosis not present

## 2022-09-30 DIAGNOSIS — Z888 Allergy status to other drugs, medicaments and biological substances status: Secondary | ICD-10-CM | POA: Diagnosis not present

## 2022-10-08 DIAGNOSIS — R300 Dysuria: Secondary | ICD-10-CM | POA: Diagnosis not present

## 2022-10-08 DIAGNOSIS — Z6824 Body mass index (BMI) 24.0-24.9, adult: Secondary | ICD-10-CM | POA: Diagnosis not present

## 2022-10-08 DIAGNOSIS — M542 Cervicalgia: Secondary | ICD-10-CM | POA: Diagnosis not present

## 2022-10-18 DIAGNOSIS — G894 Chronic pain syndrome: Secondary | ICD-10-CM | POA: Diagnosis not present

## 2022-10-18 DIAGNOSIS — M6281 Muscle weakness (generalized): Secondary | ICD-10-CM | POA: Diagnosis not present

## 2022-10-20 DIAGNOSIS — E039 Hypothyroidism, unspecified: Secondary | ICD-10-CM | POA: Diagnosis not present

## 2022-10-26 ENCOUNTER — Encounter: Payer: Self-pay | Admitting: Cardiovascular Disease

## 2022-10-27 DIAGNOSIS — I482 Chronic atrial fibrillation, unspecified: Secondary | ICD-10-CM | POA: Diagnosis not present

## 2022-10-27 DIAGNOSIS — I10 Essential (primary) hypertension: Secondary | ICD-10-CM | POA: Diagnosis not present

## 2022-10-27 DIAGNOSIS — E785 Hyperlipidemia, unspecified: Secondary | ICD-10-CM | POA: Diagnosis not present

## 2022-10-27 DIAGNOSIS — K219 Gastro-esophageal reflux disease without esophagitis: Secondary | ICD-10-CM | POA: Diagnosis not present

## 2022-11-16 DIAGNOSIS — M79675 Pain in left toe(s): Secondary | ICD-10-CM | POA: Diagnosis not present

## 2022-11-16 DIAGNOSIS — B351 Tinea unguium: Secondary | ICD-10-CM | POA: Diagnosis not present

## 2022-11-16 DIAGNOSIS — M79674 Pain in right toe(s): Secondary | ICD-10-CM | POA: Diagnosis not present

## 2022-12-02 DIAGNOSIS — C44319 Basal cell carcinoma of skin of other parts of face: Secondary | ICD-10-CM | POA: Diagnosis not present

## 2022-12-16 DIAGNOSIS — M6281 Muscle weakness (generalized): Secondary | ICD-10-CM | POA: Diagnosis not present

## 2022-12-16 DIAGNOSIS — G894 Chronic pain syndrome: Secondary | ICD-10-CM | POA: Diagnosis not present

## 2022-12-21 DIAGNOSIS — R300 Dysuria: Secondary | ICD-10-CM | POA: Diagnosis not present

## 2022-12-21 DIAGNOSIS — L309 Dermatitis, unspecified: Secondary | ICD-10-CM | POA: Diagnosis not present

## 2022-12-29 DIAGNOSIS — E785 Hyperlipidemia, unspecified: Secondary | ICD-10-CM | POA: Diagnosis not present

## 2022-12-29 DIAGNOSIS — I482 Chronic atrial fibrillation, unspecified: Secondary | ICD-10-CM | POA: Diagnosis not present

## 2022-12-29 DIAGNOSIS — E039 Hypothyroidism, unspecified: Secondary | ICD-10-CM | POA: Diagnosis not present

## 2022-12-29 DIAGNOSIS — D7389 Other diseases of spleen: Secondary | ICD-10-CM | POA: Diagnosis not present

## 2023-01-05 ENCOUNTER — Encounter: Payer: Self-pay | Admitting: *Deleted

## 2023-01-05 NOTE — Progress Notes (Deleted)
    Cardiology Office Note  Date: 01/05/2023   ID: Melanie [name], DOB Jul 26, 1926, MRN [id number]  History of Present Illness: Melanie [name] is a 89 y.o. female last seen in October 2023.  She resides at [facility].  Medtronic pacemaker in place with followed by Dr. Nelly Laurence.  Recent device interrogation revealed normal function with 38% AF burden.  Physical Exam: VS:  There were no vitals taken for this visit., BMI There is no height or weight on file to calculate BMI.  Wt Readings from Last 3 Encounters:  09/01/22 130 lb (59 kg)  07/20/22 145 lb 12.8 oz (66.1 kg)  04/27/22 147 lb 12.8 oz (67 kg)    General: Patient appears comfortable at rest. HEENT: Conjunctiva and lids normal, oropharynx clear with moist mucosa. Neck: Supple, no elevated JVP or carotid bruits, no thyromegaly. Lungs: Clear to auscultation, nonlabored breathing at rest. Cardiac: Regular rate and rhythm, no S3 or significant systolic murmur, no pericardial rub. Abdomen: Soft, nontender, no hepatomegaly, bowel sounds present, no guarding or rebound. Extremities: No pitting edema, distal pulses 2+. Skin: Warm and dry. Musculoskeletal: No kyphosis. Neuropsychiatric: Alert and oriented x3, affect grossly appropriate.  ECG:  An ECG dated 04/02/2022 was personally reviewed today and demonstrated:  Atrial paced rhythm with nonspecific T wave changes.  Labwork: January 2024: Hemoglobin 11.0, platelets 161, potassium 4.1, BUN 16, creatinine 1.5, AST 23, ALT 17 05/25/2022: Creatinine, Ser 1.50   Other Studies Reviewed Today:  No interval cardiac testing for review today.  Assessment and Plan:  1.  Paroxysmal to persistent atrial fibrillation with CHA2DS2-VASc score of 5.  She is on Eliquis for stroke prophylaxis.  Last device interrogation indicated 38% AF burden, increased from previous.  2.  Sinus node dysfunction with Medtronic pacemaker in place.  She follows with Dr. Nelly Laurence.  3.  Two-vessel CAD by cardiac  catheterization in 2004 with moderate LAD stenosis associated with collateralization, also occluded RCA.  She continues on medical therapy.  Disposition:  Follow up {follow up:15908}  Signed, Jonelle Sidle, M.D., F.A.C.C. [facility] at [facility]

## 2023-01-06 ENCOUNTER — Ambulatory Visit: Payer: Medicare Other | Admitting: Cardiology

## 2023-01-06 ENCOUNTER — Encounter: Payer: Self-pay | Admitting: Cardiology

## 2023-01-06 DIAGNOSIS — I25119 Atherosclerotic heart disease of native coronary artery with unspecified angina pectoris: Secondary | ICD-10-CM

## 2023-01-06 DIAGNOSIS — I480 Paroxysmal atrial fibrillation: Secondary | ICD-10-CM

## 2023-01-14 ENCOUNTER — Encounter: Payer: Self-pay | Admitting: Cardiology

## 2023-01-17 ENCOUNTER — Other Ambulatory Visit: Payer: Self-pay | Admitting: Cardiology

## 2023-01-18 ENCOUNTER — Ambulatory Visit (INDEPENDENT_AMBULATORY_CARE_PROVIDER_SITE_OTHER): Payer: 59

## 2023-01-18 DIAGNOSIS — M6281 Muscle weakness (generalized): Secondary | ICD-10-CM | POA: Diagnosis not present

## 2023-01-18 DIAGNOSIS — G894 Chronic pain syndrome: Secondary | ICD-10-CM | POA: Diagnosis not present

## 2023-01-18 DIAGNOSIS — I495 Sick sinus syndrome: Secondary | ICD-10-CM

## 2023-01-19 LAB — CUP PACEART REMOTE DEVICE CHECK
Battery Impedance: 1312 Ohm
Battery Remaining Longevity: 51 mo
Battery Voltage: 2.77 V
Brady Statistic AP VP Percent: 1 %
Brady Statistic AP VS Percent: 76 %
Brady Statistic AS VP Percent: 6 %
Brady Statistic AS VS Percent: 16 %
Date Time Interrogation Session: 20240424102113
Implantable Lead Connection Status: 753985
Implantable Lead Connection Status: 753985
Implantable Lead Implant Date: 20040827
Implantable Lead Implant Date: 20040827
Implantable Lead Location: 753859
Implantable Lead Location: 753860
Implantable Lead Model: 4092
Implantable Pulse Generator Implant Date: 20140519
Lead Channel Impedance Value: 424 Ohm
Lead Channel Impedance Value: 774 Ohm
Lead Channel Pacing Threshold Amplitude: 0.625 V
Lead Channel Pacing Threshold Amplitude: 1.125 V
Lead Channel Pacing Threshold Pulse Width: 0.4 ms
Lead Channel Pacing Threshold Pulse Width: 0.4 ms
Lead Channel Setting Pacing Amplitude: 2 V
Lead Channel Setting Pacing Amplitude: 2.5 V
Lead Channel Setting Pacing Pulse Width: 0.4 ms
Lead Channel Setting Sensing Sensitivity: 5.6 mV
Zone Setting Status: 755011
Zone Setting Status: 755011

## 2023-01-24 ENCOUNTER — Ambulatory Visit: Payer: 59 | Admitting: Cardiology

## 2023-01-24 DIAGNOSIS — I509 Heart failure, unspecified: Secondary | ICD-10-CM | POA: Diagnosis not present

## 2023-01-24 DIAGNOSIS — I5033 Acute on chronic diastolic (congestive) heart failure: Secondary | ICD-10-CM | POA: Diagnosis not present

## 2023-01-24 DIAGNOSIS — Z9981 Dependence on supplemental oxygen: Secondary | ICD-10-CM | POA: Diagnosis not present

## 2023-01-24 DIAGNOSIS — R112 Nausea with vomiting, unspecified: Secondary | ICD-10-CM | POA: Diagnosis not present

## 2023-01-24 DIAGNOSIS — N1832 Chronic kidney disease, stage 3b: Secondary | ICD-10-CM | POA: Diagnosis not present

## 2023-01-24 DIAGNOSIS — E119 Type 2 diabetes mellitus without complications: Secondary | ICD-10-CM | POA: Diagnosis not present

## 2023-01-24 DIAGNOSIS — R0902 Hypoxemia: Secondary | ICD-10-CM | POA: Diagnosis not present

## 2023-01-24 DIAGNOSIS — E079 Disorder of thyroid, unspecified: Secondary | ICD-10-CM | POA: Diagnosis not present

## 2023-01-24 DIAGNOSIS — Z7901 Long term (current) use of anticoagulants: Secondary | ICD-10-CM | POA: Diagnosis not present

## 2023-01-24 DIAGNOSIS — Z881 Allergy status to other antibiotic agents status: Secondary | ICD-10-CM | POA: Diagnosis not present

## 2023-01-24 DIAGNOSIS — Z79899 Other long term (current) drug therapy: Secondary | ICD-10-CM | POA: Diagnosis not present

## 2023-01-24 DIAGNOSIS — R509 Fever, unspecified: Secondary | ICD-10-CM | POA: Diagnosis not present

## 2023-01-24 DIAGNOSIS — N179 Acute kidney failure, unspecified: Secondary | ICD-10-CM | POA: Diagnosis not present

## 2023-01-24 DIAGNOSIS — R54 Age-related physical debility: Secondary | ICD-10-CM | POA: Diagnosis not present

## 2023-01-24 DIAGNOSIS — I480 Paroxysmal atrial fibrillation: Secondary | ICD-10-CM | POA: Diagnosis not present

## 2023-01-24 DIAGNOSIS — E039 Hypothyroidism, unspecified: Secondary | ICD-10-CM | POA: Diagnosis not present

## 2023-01-24 DIAGNOSIS — Z6824 Body mass index (BMI) 24.0-24.9, adult: Secondary | ICD-10-CM | POA: Diagnosis not present

## 2023-01-24 DIAGNOSIS — I130 Hypertensive heart and chronic kidney disease with heart failure and stage 1 through stage 4 chronic kidney disease, or unspecified chronic kidney disease: Secondary | ICD-10-CM | POA: Diagnosis not present

## 2023-01-24 DIAGNOSIS — R079 Chest pain, unspecified: Secondary | ICD-10-CM | POA: Diagnosis not present

## 2023-01-24 DIAGNOSIS — K219 Gastro-esophageal reflux disease without esophagitis: Secondary | ICD-10-CM | POA: Diagnosis not present

## 2023-01-24 DIAGNOSIS — Z950 Presence of cardiac pacemaker: Secondary | ICD-10-CM | POA: Diagnosis not present

## 2023-01-24 DIAGNOSIS — I082 Rheumatic disorders of both aortic and tricuspid valves: Secondary | ICD-10-CM | POA: Diagnosis not present

## 2023-01-24 DIAGNOSIS — I272 Pulmonary hypertension, unspecified: Secondary | ICD-10-CM | POA: Diagnosis not present

## 2023-01-24 DIAGNOSIS — Z794 Long term (current) use of insulin: Secondary | ICD-10-CM | POA: Diagnosis not present

## 2023-01-24 DIAGNOSIS — I4891 Unspecified atrial fibrillation: Secondary | ICD-10-CM | POA: Diagnosis not present

## 2023-01-24 DIAGNOSIS — E1122 Type 2 diabetes mellitus with diabetic chronic kidney disease: Secondary | ICD-10-CM | POA: Diagnosis not present

## 2023-01-24 DIAGNOSIS — I5023 Acute on chronic systolic (congestive) heart failure: Secondary | ICD-10-CM | POA: Diagnosis not present

## 2023-01-24 NOTE — Progress Notes (Deleted)
    Cardiology Office Note  Date: 01/24/2023   ID: Melanie [name], DOB 02/10/1926, MRN [id number]  History of Present Illness: Melanie [name] is a 89 y.o. female that I met in October 2023.  Medtronic pacemaker in place with follow-up by Dr. Nelly Laurence.  Recent device interrogation indicated normal function with 32% AF burden.  Physical Exam: VS:  There were no vitals taken for this visit., BMI There is no height or weight on file to calculate BMI.  Wt Readings from Last 3 Encounters:  09/01/22 130 lb (59 kg)  07/20/22 145 lb 12.8 oz (66.1 kg)  04/27/22 147 lb 12.8 oz (67 kg)    General: Patient appears comfortable at rest. HEENT: Conjunctiva and lids normal, oropharynx clear with moist mucosa. Neck: Supple, no elevated JVP or carotid bruits, no thyromegaly. Lungs: Clear to auscultation, nonlabored breathing at rest. Cardiac: Regular rate and rhythm, no S3 or significant systolic murmur, no pericardial rub. Abdomen: Soft, nontender, no hepatomegaly, bowel sounds present, no guarding or rebound. Extremities: No pitting edema, distal pulses 2+. Skin: Warm and dry. Musculoskeletal: No kyphosis. Neuropsychiatric: Alert and oriented x3, affect grossly appropriate.  ECG:  An ECG dated 08/02/2022 was personally reviewed today and demonstrated:  Atrial paced rhythm with IVCD and ST-T wave abnormalities.  Labwork: 05/25/2022: Creatinine, Ser 1.50  October 2023: BUN 20, creatinine 1.41, potassium 4.5, AST 15, ALT 9, cholesterol 163, glycerides 116, HDL 53, LDL 89, TSH 1.42  Other Studies Reviewed Today:  No interval cardiac testing for review today.  Assessment and Plan:  1.  Paroxysmal atrial fibrillation with CHA2DS2-VASc score of 5.  2.  Sinus node dysfunction with Medtronic pacemaker in place and follow-up by Dr. Nelly Laurence.  3.  CAD with cardiac catheterization 2004 demonstrating moderate LAD disease and occluded RCA associated with collaterals.  She has been managed medically over  time.  4.  Essential hypertension.  Disposition:  Follow up {follow up:15908}  Signed, Jonelle Sidle, M.D., F.A.C.C. [facility]

## 2023-01-29 DIAGNOSIS — R300 Dysuria: Secondary | ICD-10-CM | POA: Diagnosis not present

## 2023-01-29 DIAGNOSIS — I4891 Unspecified atrial fibrillation: Secondary | ICD-10-CM | POA: Diagnosis not present

## 2023-01-29 DIAGNOSIS — K219 Gastro-esophageal reflux disease without esophagitis: Secondary | ICD-10-CM | POA: Diagnosis not present

## 2023-01-29 DIAGNOSIS — R82998 Other abnormal findings in urine: Secondary | ICD-10-CM | POA: Diagnosis not present

## 2023-01-29 DIAGNOSIS — R5381 Other malaise: Secondary | ICD-10-CM | POA: Diagnosis not present

## 2023-01-29 DIAGNOSIS — Z881 Allergy status to other antibiotic agents status: Secondary | ICD-10-CM | POA: Diagnosis not present

## 2023-01-29 DIAGNOSIS — R7989 Other specified abnormal findings of blood chemistry: Secondary | ICD-10-CM | POA: Diagnosis not present

## 2023-01-29 DIAGNOSIS — Z888 Allergy status to other drugs, medicaments and biological substances status: Secondary | ICD-10-CM | POA: Diagnosis not present

## 2023-01-29 DIAGNOSIS — Z743 Need for continuous supervision: Secondary | ICD-10-CM | POA: Diagnosis not present

## 2023-01-29 DIAGNOSIS — E079 Disorder of thyroid, unspecified: Secondary | ICD-10-CM | POA: Diagnosis not present

## 2023-01-29 DIAGNOSIS — E039 Hypothyroidism, unspecified: Secondary | ICD-10-CM | POA: Diagnosis not present

## 2023-01-29 DIAGNOSIS — Z950 Presence of cardiac pacemaker: Secondary | ICD-10-CM | POA: Diagnosis not present

## 2023-01-29 DIAGNOSIS — R339 Retention of urine, unspecified: Secondary | ICD-10-CM | POA: Diagnosis not present

## 2023-01-29 DIAGNOSIS — R319 Hematuria, unspecified: Secondary | ICD-10-CM | POA: Diagnosis not present

## 2023-01-29 DIAGNOSIS — E119 Type 2 diabetes mellitus without complications: Secondary | ICD-10-CM | POA: Diagnosis not present

## 2023-01-29 DIAGNOSIS — I110 Hypertensive heart disease with heart failure: Secondary | ICD-10-CM | POA: Diagnosis not present

## 2023-01-29 DIAGNOSIS — Z79899 Other long term (current) drug therapy: Secondary | ICD-10-CM | POA: Diagnosis not present

## 2023-01-29 DIAGNOSIS — I10 Essential (primary) hypertension: Secondary | ICD-10-CM | POA: Diagnosis not present

## 2023-01-29 DIAGNOSIS — I509 Heart failure, unspecified: Secondary | ICD-10-CM | POA: Diagnosis not present

## 2023-01-30 ENCOUNTER — Encounter (HOSPITAL_COMMUNITY): Payer: Self-pay | Admitting: Emergency Medicine

## 2023-01-30 ENCOUNTER — Other Ambulatory Visit: Payer: Self-pay

## 2023-01-30 ENCOUNTER — Emergency Department (HOSPITAL_COMMUNITY)
Admission: EM | Admit: 2023-01-30 | Discharge: 2023-01-30 | Disposition: A | Discharge status: Home / Self Care | Payer: 59 | Attending: Emergency Medicine | Admitting: Emergency Medicine

## 2023-01-30 DIAGNOSIS — N9089 Other specified noninflammatory disorders of vulva and perineum: Secondary | ICD-10-CM

## 2023-01-30 DIAGNOSIS — R339 Retention of urine, unspecified: Secondary | ICD-10-CM | POA: Diagnosis not present

## 2023-01-30 DIAGNOSIS — Z7901 Long term (current) use of anticoagulants: Secondary | ICD-10-CM | POA: Diagnosis not present

## 2023-01-30 DIAGNOSIS — I10 Essential (primary) hypertension: Secondary | ICD-10-CM | POA: Insufficient documentation

## 2023-01-30 DIAGNOSIS — I2510 Atherosclerotic heart disease of native coronary artery without angina pectoris: Secondary | ICD-10-CM | POA: Insufficient documentation

## 2023-01-30 DIAGNOSIS — E039 Hypothyroidism, unspecified: Secondary | ICD-10-CM | POA: Diagnosis not present

## 2023-01-30 DIAGNOSIS — Z79899 Other long term (current) drug therapy: Secondary | ICD-10-CM | POA: Diagnosis not present

## 2023-01-30 DIAGNOSIS — N3000 Acute cystitis without hematuria: Secondary | ICD-10-CM

## 2023-01-30 LAB — URINALYSIS, ROUTINE W REFLEX MICROSCOPIC
Bacteria, UA: NONE SEEN
Bilirubin Urine: NEGATIVE
Glucose, UA: NEGATIVE mg/dL
Hgb urine dipstick: NEGATIVE
Ketones, ur: NEGATIVE mg/dL
Nitrite: POSITIVE — AB
Protein, ur: NEGATIVE mg/dL
Specific Gravity, Urine: 1.008 (ref 1.005–1.030)
pH: 5 (ref 5.0–8.0)

## 2023-01-30 NOTE — Discharge Instructions (Signed)
You were seen today for with concerns for urinary retention.  You were able to spontaneously urinate.  Continue your medications for your urinary tract infection.  Follow back up with OB/GYN regarding your labial adhesions.  If you have recurrent retention, these will likely make it difficult to place a catheter in the future.  Also follow-up with urology.

## 2023-01-30 NOTE — ED Notes (Signed)
Patient verbalizes understanding of discharge instructions. Opportunity for questioning and answers were provided. Armband removed by staff, pt discharged from ED. Wheeled out to lobby with family, assisted into car

## 2023-01-30 NOTE — ED Triage Notes (Signed)
Pt arrives via [facility] as transfer from [facility], for urinary retention. Per report, pt had a bladder scan of , and the team was unsuccessful with catheter insertion, but able to then void of orange urine. Pt resides at [facility]

## 2023-01-30 NOTE — ED Provider Notes (Signed)
[place] EMERGENCY DEPARTMENT AT [facility] Provider Note   CSN: [id number] Arrival date & time: 01/30/23  0343     History  Chief Complaint  Patient presents with   Urinary Retention    Melanie [name] is a 89 y.o. female.  HPI     This is a 89 year old female who presents as a transfer from [facility] with concerns for an anomaly and urinary retention.  She presented earlier today with inability to urinate.  She was diagnosed with UTI and has started antibiotics already after her visit yesterday.  Upon her arrival here, she is actively urinating in a bedpan.  She has no complaints.  Denies abdominal pain.  Does state that she had difficulty urinating prior to going to the hospital earlier.  I spoke with the ER physician at [facility] and he reported that she did not have a normal-appearing perineum and appeared to have a lack of vaginal introitus.  They were unable to cath her and were concerned that she was retaining up to 3 L of fluid.  Upon arrival here, she has urinated in the bedpan.  She is comfortable.  She only has 100 cc in her bladder.  Patient reports that she has never had sexual intercourse and has not born children.  She has an OB/GYN visit from March 2023 where she was noted to have labial adhesions and significant atrophy.  She was prescribed an estrogen cream.  Home Medications Prior to Admission medications   Medication Sig Start Date End Date Taking? Authorizing Provider  acetaminophen (TYLENOL 8 HOUR) 650 MG CR tablet Take 1 tablet (650 mg total) by mouth every 6 (six) hours. 09/01/22   Burgess Amor, PA-C  amiodarone (PACERONE) 200 MG tablet Take 200mg  (1 tab) by mouth daily x 4 weeks, then decrease to 100mg  (1/2 tab) by mouth daily thereafter Patient taking differently: Take 100 mg by mouth daily. 04/02/22   Allred, Fayrene Fearing, MD  amLODipine (NORVASC) 10 MG tablet Take 10 mg by mouth daily. 12/25/18   [provider]  clonazePAM (KLONOPIN) 0.5  MG tablet Take 0.5 mg by mouth at bedtime. For restless leg syndrome    [provider]  diclofenac Sodium (VOLTAREN ARTHRITIS PAIN) 1 % GEL Apply 2 g topically 4 (four) times daily. 09/01/22   Burgess Amor, PA-C  Elastic Bandages & Supports (MEDICAL COMPRESSION STOCKINGS) MISC 1 each by Does not apply route as directed. Knee Hi Compression Stockings to be measured prior to order-medium pressure Dx: Lower extremity edema & CHF 01/08/19   Berton Bon, NP  ELIQUIS 5 MG TABS tablet Take 5 mg by mouth 2 (two) times daily. 03/29/22   [provider]  ezetimibe (ZETIA) 10 MG tablet Take 10 mg by mouth daily.    [provider]  furosemide (LASIX) 40 MG tablet Take 40 mg by mouth daily. 12/27/18   [provider]  HM CRANBERRY 500 MG TABS Take 1 tablet by mouth daily. 11/09/19   [provider]  hydrocortisone (ANUSOL-HC) 2.5 % rectal cream Place 1 Application rectally 2 (two) times daily as needed for hemorrhoids or anal itching. 04/27/22   Tiffany Kocher, PA-C  isosorbide mononitrate (IMDUR) 30 MG 24 hr tablet Take 30 mg by mouth every morning.     [provider]  levothyroxine (SYNTHROID) 112 MCG tablet Take 112 mcg by mouth daily. 11/21/19   [provider]  lidocaine (LIDODERM) 5 % Place 1 patch onto the skin daily. Remove &  Discard patch within 12 hours or as directed by MD 09/01/22   Burgess Amor, PA-C  metoprolol tartrate (LOPRESSOR) 25 MG tablet TAKE 1 TABLET BY MOUTH TWICE DAILY 01/17/23   Jonelle Sidle, MD  nitrofurantoin (MACRODANTIN) 50 MG capsule Take 50 mg by mouth at bedtime. 03/29/22   [provider]  pantoprazole (PROTONIX) 40 MG tablet Take 40 mg by mouth daily. 11/21/19   [provider]  polyethylene glycol powder (GLYCOLAX/MIRALAX) 17 GM/SCOOP powder Take 1/2 capfule daily for constipation. Hold for 24 hours if develops diarrhea. 04/27/22   Tiffany Kocher, PA-C      Allergies    Ciprofloxacin, Hctz  [hydrochlorothiazide], and Lopid [gemfibrozil]    Review of Systems   Review of Systems  Constitutional:  Negative for fever.  Genitourinary:  Positive for difficulty urinating.  All other systems reviewed and are negative.   Physical Exam Updated Vital Signs BP 136/66   Pulse (!) 59   Temp (!) 97.3 F (36.3 C) (Oral)   Resp 18   Wt 60 kg   SpO2 97%   BMI 23.43 kg/m  Physical Exam Vitals and nursing note reviewed.  Constitutional:      Appearance: She is well-developed.     Comments: Elderly, nontoxic-appearing, no acute distress  HENT:     Head: Normocephalic and atraumatic.  Eyes:     Pupils: Pupils are equal, round, and reactive to light.  Cardiovascular:     Rate and Rhythm: Normal rate and regular rhythm.  Pulmonary:     Effort: Pulmonary effort is normal. No respiratory distress.  Abdominal:     Palpations: Abdomen is soft.  Genitourinary:    Comments: Patient appears to have fused labia anteriorly and atrophic vagina Musculoskeletal:     Cervical back: Neck supple.  Skin:    General: Skin is warm and dry.  Neurological:     Mental Status: She is alert and oriented to person, place, and time.  Psychiatric:        Mood and Affect: Mood normal.     ED Results / Procedures / Treatments   Labs (all labs ordered are listed, but only abnormal results are displayed) Labs Reviewed  URINALYSIS, ROUTINE W REFLEX MICROSCOPIC - Abnormal; Notable for the following components:      Result Value   Color, Urine AMBER (*)    Nitrite POSITIVE (*)    Leukocytes,Ua TRACE (*)    All other components within normal limits    EKG None  Radiology No results found.  Procedures Procedures    Medications Ordered in ED Medications - No data to display  ED Course/ Medical Decision Making/ A&P                             Medical Decision Making Amount and/or Complexity of Data Reviewed Labs: ordered.   This patient presents to the ED for concern of urinary  retention, this involves an extensive number of treatment options, and is a complaint that carries with it a high risk of complications and morbidity.  I considered the following differential and admission for this acute, potentially life threatening condition.  The differential diagnosis includes retention, UTI, anomaly  MDM:    This is a 89 year old female who transferred from [facility] for urology evaluation with concerns for acute retention and difficulty placing Foley catheter.  On my evaluation, she is spontaneously urinating into a bedpan.  She states that she  feels fine.  Postvoid bladder scan with 100 cc.  Patient does have what appear to be significant labial adhesions and vaginal atrophy.  This would likely make placing catheter quite difficult.  At this point she is not acutely retaining.  Repeat urinalysis shows nitrite positive urine with 11-20 white cells.  She is currently on antibiotics.  Her niece is at the bedside and confirms.  Patient does not have any complaints at this time.  I did discuss briefly with Dr. Annabell Howells who was consulted from [facility].  Feel it is reasonable that she should follow-up as an outpatient as if she has recurrent retention she may need to have a plan for possible suprapubic approach.  I also recommend she follow back up with OB/GYN regarding treatment for these adhesions and ongoing vaginal estrogen.  (Labs, imaging, consults)  Labs: I Ordered, and personally interpreted labs.  The pertinent results include: Urinalysis  Imaging Studies ordered: I ordered imaging studies including none I independently visualized and interpreted imaging. I agree with the radiologist interpretation  Additional history obtained from chart review.  External records from outside source obtained and reviewed including recent ED visit, OB/GYN outpatient notes  Cardiac Monitoring: The patient was not maintained on a cardiac monitor.  If on the cardiac monitor, I  personally viewed and interpreted the cardiac monitored which showed an underlying rhythm of: N/A  Reevaluation: After the interventions noted above, I reevaluated the patient and found that they have :resolved  Social Determinants of Health:  lives at [place]  Disposition: Discharge  Co morbidities that complicate the patient evaluation  Past Medical History:  Diagnosis Date   CAD (coronary artery disease)    Two vessel, quiescent; 70% LAD aretery disease and well collateralized, 100% RCA by cardiac cath in 2004; NL LVF; low risk Cardioloite; EF 74%, 11/10   Dyslipidemia    Hypercalcemia    Hypertension    Hypothyroidism    Paroxysmal atrial fibrillation (HCC)    on Eliquis and amiodarone   Sinus node dysfunction (HCC)    s/p Medtronic dual-chamber pacemaker   Vertigo      Medicines No orders of the defined types were placed in this encounter.   I have reviewed the patients home medicines and have made adjustments as needed  Problem List / ED Course: Problem List Items Addressed This Visit   None Visit Diagnoses     Acute cystitis without hematuria    -  Primary   Labial adhesions       Urinary retention                       Final Clinical Impression(s) / ED Diagnoses Final diagnoses:  Acute cystitis without hematuria  Labial adhesions  Urinary retention    Rx / DC Orders ED Discharge Orders     None         Shon Baton, MD 01/30/23 [phone]

## 2023-02-01 DIAGNOSIS — N390 Urinary tract infection, site not specified: Secondary | ICD-10-CM | POA: Diagnosis not present

## 2023-02-01 DIAGNOSIS — K649 Unspecified hemorrhoids: Secondary | ICD-10-CM | POA: Diagnosis not present

## 2023-02-01 DIAGNOSIS — Z6824 Body mass index (BMI) 24.0-24.9, adult: Secondary | ICD-10-CM | POA: Diagnosis not present

## 2023-02-01 DIAGNOSIS — K219 Gastro-esophageal reflux disease without esophagitis: Secondary | ICD-10-CM | POA: Diagnosis not present

## 2023-02-01 DIAGNOSIS — L719 Rosacea, unspecified: Secondary | ICD-10-CM | POA: Diagnosis not present

## 2023-02-01 DIAGNOSIS — I482 Chronic atrial fibrillation, unspecified: Secondary | ICD-10-CM | POA: Diagnosis not present

## 2023-02-01 DIAGNOSIS — K5900 Constipation, unspecified: Secondary | ICD-10-CM | POA: Diagnosis not present

## 2023-02-01 DIAGNOSIS — R339 Retention of urine, unspecified: Secondary | ICD-10-CM | POA: Diagnosis not present

## 2023-02-01 DIAGNOSIS — D7389 Other diseases of spleen: Secondary | ICD-10-CM | POA: Diagnosis not present

## 2023-02-01 DIAGNOSIS — Z950 Presence of cardiac pacemaker: Secondary | ICD-10-CM | POA: Diagnosis not present

## 2023-02-08 DIAGNOSIS — Z888 Allergy status to other drugs, medicaments and biological substances status: Secondary | ICD-10-CM | POA: Diagnosis not present

## 2023-02-08 DIAGNOSIS — R300 Dysuria: Secondary | ICD-10-CM | POA: Diagnosis not present

## 2023-02-08 DIAGNOSIS — Z881 Allergy status to other antibiotic agents status: Secondary | ICD-10-CM | POA: Diagnosis not present

## 2023-02-08 DIAGNOSIS — R5381 Other malaise: Secondary | ICD-10-CM | POA: Diagnosis not present

## 2023-02-08 DIAGNOSIS — E079 Disorder of thyroid, unspecified: Secondary | ICD-10-CM | POA: Diagnosis not present

## 2023-02-08 DIAGNOSIS — Z743 Need for continuous supervision: Secondary | ICD-10-CM | POA: Diagnosis not present

## 2023-02-08 DIAGNOSIS — R339 Retention of urine, unspecified: Secondary | ICD-10-CM | POA: Diagnosis not present

## 2023-02-08 DIAGNOSIS — R279 Unspecified lack of coordination: Secondary | ICD-10-CM | POA: Diagnosis not present

## 2023-02-11 DIAGNOSIS — R339 Retention of urine, unspecified: Secondary | ICD-10-CM | POA: Diagnosis not present

## 2023-02-11 DIAGNOSIS — R109 Unspecified abdominal pain: Secondary | ICD-10-CM | POA: Diagnosis not present

## 2023-02-14 DIAGNOSIS — N390 Urinary tract infection, site not specified: Secondary | ICD-10-CM | POA: Diagnosis not present

## 2023-02-16 DIAGNOSIS — M6281 Muscle weakness (generalized): Secondary | ICD-10-CM | POA: Diagnosis not present

## 2023-02-16 DIAGNOSIS — G894 Chronic pain syndrome: Secondary | ICD-10-CM | POA: Diagnosis not present

## 2023-02-16 NOTE — Progress Notes (Signed)
Remote pacemaker transmission.   

## 2023-02-23 ENCOUNTER — Encounter: Payer: Self-pay | Admitting: Nurse Practitioner

## 2023-02-23 ENCOUNTER — Ambulatory Visit: Payer: 59 | Attending: Cardiology | Admitting: Nurse Practitioner

## 2023-02-23 VITALS — BP 136/70 | HR 59 | Ht 63.0 in | Wt 148.6 lb

## 2023-02-23 DIAGNOSIS — I10 Essential (primary) hypertension: Secondary | ICD-10-CM

## 2023-02-23 DIAGNOSIS — I2510 Atherosclerotic heart disease of native coronary artery without angina pectoris: Secondary | ICD-10-CM | POA: Diagnosis not present

## 2023-02-23 DIAGNOSIS — R600 Localized edema: Secondary | ICD-10-CM | POA: Diagnosis not present

## 2023-02-23 DIAGNOSIS — I350 Nonrheumatic aortic (valve) stenosis: Secondary | ICD-10-CM

## 2023-02-23 DIAGNOSIS — R5381 Other malaise: Secondary | ICD-10-CM

## 2023-02-23 DIAGNOSIS — Z79899 Other long term (current) drug therapy: Secondary | ICD-10-CM

## 2023-02-23 DIAGNOSIS — I480 Paroxysmal atrial fibrillation: Secondary | ICD-10-CM | POA: Diagnosis not present

## 2023-02-23 DIAGNOSIS — I5032 Chronic diastolic (congestive) heart failure: Secondary | ICD-10-CM | POA: Diagnosis not present

## 2023-02-23 DIAGNOSIS — I272 Pulmonary hypertension, unspecified: Secondary | ICD-10-CM

## 2023-02-23 DIAGNOSIS — I38 Endocarditis, valve unspecified: Secondary | ICD-10-CM | POA: Diagnosis not present

## 2023-02-23 DIAGNOSIS — R0789 Other chest pain: Secondary | ICD-10-CM | POA: Diagnosis not present

## 2023-02-23 DIAGNOSIS — E785 Hyperlipidemia, unspecified: Secondary | ICD-10-CM | POA: Diagnosis not present

## 2023-02-23 DIAGNOSIS — R630 Anorexia: Secondary | ICD-10-CM

## 2023-02-23 DIAGNOSIS — Z9189 Other specified personal risk factors, not elsewhere classified: Secondary | ICD-10-CM

## 2023-02-23 NOTE — Patient Instructions (Signed)
Medication Instructions:   Continue all current medications.   Labwork:  CBC, BMET, BNP - orders given Please do in 1-2 weeks Office will contact with results via phone, letter or mychart.     Testing/Procedures:  none  Follow-Up:  2-3 months   Any Other Special Instructions Will Be Listed Below (If Applicable).   If you need a refill on your cardiac medications before your next appointment, please call your pharmacy.

## 2023-02-23 NOTE — Progress Notes (Signed)
Office Visit    Patient Name: Melanie [name] Date of Encounter: 02/23/2023  PCP:  [facility], [facility]   [facility]  Cardiologist:  Nona Dell, MD  Advanced Practice Provider:  No care team member to display Electrophysiologist:  Hillis Range, MD (Inactive)   Chief Complaint    Melanie [name] is a 89 y.o. female with a hx of CAD, dyslipidemia, PAF, hypothyroidism, HFpEF, hypertension, sinus node dysfunction, s/p PPM, who presents today for 22-month follow-up.  Past Medical History    Past Medical History:  Diagnosis Date   CAD (coronary artery disease)    Two vessel, quiescent; 70% LAD aretery disease and well collateralized, 100% RCA by cardiac cath in 2004; NL LVF; low risk Cardioloite; EF 74%, 11/10   Dyslipidemia    Hypercalcemia    Hypertension    Hypothyroidism    Paroxysmal atrial fibrillation (HCC)    on Eliquis and amiodarone   Sinus node dysfunction (HCC)    s/p Medtronic dual-chamber pacemaker   Vertigo    Past Surgical History:  Procedure Laterality Date   ABDOMINAL HYSTERECTOMY     FRACTURE SURGERY     PACEMAKER GENERATOR CHANGE N/A 02/12/2013   Procedure: PACEMAKER GENERATOR CHANGE;  Surgeon: Duke Salvia, MD;  Location: [facility] CATH LAB;  Service: Cardiovascular;  Laterality: N/A;   PACEMAKER INSERTION  05/24/03   MDT [place]    Allergies  Allergies  Allergen Reactions   Ciprofloxacin Other (See Comments)    Unknown; patient states they are unaware of having a reaction   Hctz [Hydrochlorothiazide] Other (See Comments)    Unknown; patient states they are unaware of having a reaction   Lopid [Gemfibrozil] Other (See Comments)    Unknown; patient states they are unaware of having a reaction     History of Present Illness    Melanie [name] is a 89 y.o. female with a PMH as mentioned above.  Previous patient of Dr. Purvis Sheffield.  Last seen by Dr. Diona Browner on July 20, 2022.  Was overall doing well at that time.  In  the interim, she has been in and out of hospital.   Admitted 01/24/2023 - 01/26/2023 for acute on chronic CHF.  Was noted to be slightly volume overloaded based on CXR and clinical exam findings.  Diuresed with IV Lasix.  Did qualify for oxygen use with exertion.  Transported back to [facility].  More recently seen at [facility] ED on Feb 08, 2023 due to reported difficulty urinary at times.  Presented with urinary retention.  Postvoid bladder scan performed did not reveal urinary tension at the time.  Today she presents for 47-month follow-up appointment.  She is a poor historian due to forgetfulness at times, evident while obtaining history.  She states she is overall doing well, does admit to heart rate getting fast recently when getting up in the morning.  Says she has not been sleeping well.  Has "spells" for the past several months, of chest pain, but mainly speaks of left arm pain.  Thinks this is related to her neighbor at SNF running the fan in the room.  States this occurs at night on the left side and left anterior/lateral thoracic side.  States this is a mild achiness.  Denies any alleviating or aggravating factors. Denies any shortness of breath, syncope, presyncope, dizziness, orthopnea, PND, swelling or significant weight changes, acute bleeding, or claudication.  Weight is stable.  She wears 2 L of oxygen via nasal cannula continuously,  says this is new for her.  EKGs/Labs/Other Studies Reviewed:   The following studies were reviewed today:   EKG:  EKG is not ordered today.     Echocardiogram 12/2022 [facility]):  1. The left ventricle is normal in size with mildly increased wall thickness. 2. The left ventricular systolic function is normal, LVEF is visually estimated at 65-70%. 3. The mitral valve leaflets are mildly thickened with normal leaflet mobility. 4. There is mild aortic regurgitation. 5. There is moderate aortic valve stenosis. 6. The left atrium is moderately  dilated in size. 7. The right ventricle is normal in size, with normal systolic function. 8. There is mild to moderate tricuspid regurgitation. 9. There is mild pulmonary hypertension. 10. There is mild to moderate pulmonic regurgitation. Left Ventricle The left ventricle is normal in size with mildly increased wall thickness. The left ventricular systolic function is normal, LVEF is visually estimated at 65-70%. The left ventricular diastolic function is indeterminate. Right Ventricle The right ventricle is normal in size, with normal systolic function. Device lead present in right heart. Left Atrium The left atrium is moderately dilated in size. Right Atrium The right atrium is normal in size. Aortic Valve The aortic valve is trileaflet with moderately thickened leaflets with reduced excursion. There is mild aortic regurgitation. There is moderate aortic valve stenosis. Peak AV transvalvular velocity: 2.1 m/s. Mean gradient: 11 mmHg. Doppler velocity index: 0.46. Estimated aortic valve area (VTI): 1.4 cm2. Estimated aortic valve area (velocity): 1.4 cm2. LVOT diameter: 2.0 cm. LVOT stroke volume index: 37 ml/m2. Mitral Valve The mitral valve leaflets are mildly thickened with normal leaflet mobility. Mitral annular calcification is present. There is no significant mitral valve regurgitation. Tricuspid Valve The tricuspid valve leaflets are normal, with normal leaflet mobility. There is mild to moderate tricuspid regurgitation. There is mild pulmonary hypertension. TR maximum velocity: 3.1 m/s Estimated PASP: 42 mmHg. Pulmonic Valve The pulmonic valve is normal. There is mild to moderate pulmonic regurgitation. There is no evidence of a significant transvalvular gradient. Aorta The aorta is normal in size in the visualized segments. Inferior Vena Cava IVC size and inspiratory change suggest normal right atrial pressure. (0-5 mmHg). Pericardium/Pleural There is no pericardial effusion.   Recent Labs: 05/25/2022:  Creatinine, Ser 1.50  Recent Lipid Panel No results found for: "CHOL", "TRIG", "HDL", "CHOLHDL", "VLDL", "LDLCALC", "LDLDIRECT"  Risk Assessment/Calculations:   CHA2DS2-VASc Score = 5   This indicates a 7.2% annual risk of stroke. The patient's score is based upon: CHF History: 1 HTN History: 1 Diabetes History: 0 Stroke History: 0 Vascular Disease History: 0 Age Score: 2 Gender Score: 1   Home Medications   Current Meds  Medication Sig   acetaminophen (TYLENOL 8 HOUR) 650 MG CR tablet Take 1 tablet (650 mg total) by mouth every 6 (six) hours.   amiodarone (PACERONE) 200 MG tablet Take 200mg  (1 tab) by mouth daily x 4 weeks, then decrease to 100mg  (1/2 tab) by mouth daily thereafter (Patient taking differently: Take 100 mg by mouth daily.)   amLODipine (NORVASC) 10 MG tablet Take 10 mg by mouth daily.   clonazePAM (KLONOPIN) 0.5 MG tablet Take 0.5 mg by mouth at bedtime. For restless leg syndrome   diclofenac Sodium (VOLTAREN ARTHRITIS PAIN) 1 % GEL Apply 2 g topically 4 (four) times daily.   Elastic Bandages & Supports (MEDICAL COMPRESSION STOCKINGS) MISC 1 each by Does not apply route as directed. Knee Hi Compression Stockings to be measured prior to order-medium pressure Dx:  Lower extremity edema & CHF   ELIQUIS 5 MG TABS tablet Take 5 mg by mouth 2 (two) times daily.   ezetimibe (ZETIA) 10 MG tablet Take 10 mg by mouth daily.   furosemide (LASIX) 40 MG tablet Take 40 mg by mouth daily.   HM CRANBERRY 500 MG TABS Take 1 tablet by mouth daily.   hydrocortisone (ANUSOL-HC) 2.5 % rectal cream Place 1 Application rectally 2 (two) times daily as needed for hemorrhoids or anal itching.   isosorbide mononitrate (IMDUR) 30 MG 24 hr tablet Take 30 mg by mouth every morning.    levothyroxine (SYNTHROID) 112 MCG tablet Take 112 mcg by mouth daily.   lidocaine (LIDODERM) 5 % Place 1 patch onto the skin daily. Remove & Discard patch within 12 hours or as directed by MD   metoprolol  tartrate (LOPRESSOR) 25 MG tablet TAKE 1 TABLET BY MOUTH TWICE DAILY   nitrofurantoin (MACRODANTIN) 50 MG capsule Take 50 mg by mouth at bedtime.   pantoprazole (PROTONIX) 40 MG tablet Take 40 mg by mouth daily.   polyethylene glycol powder (GLYCOLAX/MIRALAX) 17 GM/SCOOP powder Take 1/2 capfule daily for constipation. Hold for 24 hours if develops diarrhea.     Review of Systems    All other systems reviewed and are otherwise negative except as noted above.  Physical Exam    VS:  BP 136/70   Pulse (!) 59   Ht 5\' 3"  (1.6 m)   Wt 148 lb 9.6 oz (67.4 kg)   SpO2 94%   BMI 26.32 kg/m  , BMI Body mass index is 26.32 kg/m.  Wt Readings from Last 3 Encounters:  02/23/23 148 lb 9.6 oz (67.4 kg)  01/30/23 132 lb 4.4 oz (60 kg)  09/01/22 130 lb (59 kg)     GEN: Frail, well developed, well nourished 89 y.o. female in no acute distress. HEENT: normal. Neck: Supple, no JVD, carotid bruits, or masses. Cardiac: S1/S2, irregular rhythm and regular rate, grade 2/6 murmur, no rubs, no gallops. No clubbing, cyanosis, edema.  Radials/PT 2+ and equal bilaterally.  Respiratory:  Respirations regular and unlabored, clear to auscultation bilaterally. GI: Soft, nontender, nondistended. MS: Thoracic kyphosis noted, otherwise no deformity or atrophy. Skin: Thin skin, warm and dry, no rash. Neuro:  Strength and sensation are intact. Psych: Normal affect.  Assessment & Plan    HFpEF, hx of leg edema, medication management Stage C, NYHA class II-III symptoms. Echocardiogram 12/2022 revealed EF 65-70%. Euvolemic and well compensated on exam. Continue Lasix, Imdur, and Lopressor. Will obtain labs: CBC, BMET, and proBNP in 1-2 weeks for medication management. Stable with no anginal symptoms. No indication for ischemic evaluation. GDMT limited d/t kidney disease. Unable to start SGLT2i d/t hx of urinary symptoms. Low sodium diet, fluid restriction <2L, and daily weights encouraged. Educated to contact our  office for weight gain of 2 lbs overnight or 5 lbs in one week.  CAD, atypical chest pain Does admit to chest pain, but says this was several week ago. Sounds MSK in etiology. Pt is poor historian. Stable with no recent anginal symptoms. No indication for ischemic evaluation.  Not a surgical candidate.  Not on ASA due to being on Eliquis. Continue Imdur and Lopressor. Heart healthy diet encouraged. Obtaining labs as mentioned above.   HTN BP stable. Discussed to monitor BP at home at least 2 hours after medications and sitting for 5-10 minutes. No medication changes at this time.  Heart healthy diet encouraged.  HLD Lipid profile WNL  06/2022.  Continue Zetia.  Heart healthy diet encouraged.  PAF Does admit to some palpitations/feel like her heart is racing in the morning.  Heart rate well-controlled today.  Will continue to monitor for now.  Continue amiodarone and Eliquis.  Denies any bleeding issues, on appropriate dosage.  If weight gets to be less than 60 kg, will need to lower Eliquis to 2.5 mg twice daily.  Heart healthy diet encouraged.  Valvular insufficiency, moderate aortic valve stenosis, pulmonary HTN Echocardiogram 12/2022 revealed moderate aortic valve stenosis with mean gradient 11 mmHg, mild aortic valve regurgitation.  Mild to moderate tricuspid valve regurgitation with mild pulmonary hypertension.  Mild to moderate pulmonic valve regurgitation also noted.  Continue Lasix.  She is not a surgical candidate, and she confirms to me that she declines surgical intervention.  Recommend updating echocardiogram in 1 year for monitoring. Continue oxygen therapy. Continue to follow with PCP.  Deconditioning, poor appetite, at risk for hospital re-admission Patient has had frequent ED/hospital visits in the last 6 months.  Health has been recently declining.  Discussed/reviewed goals of care.  She is agreeable to palliative care referral, will reach out to Nilsa Nutting with [facility] to see what needs to be done with regarding palliative care referral.  Disposition: Follow up in 2-3 month(s) with Nona Dell, MD or APP.  Signed, Sharlene Dory, NP 02/23/2023, 3:03 PM [facility]

## 2023-02-24 DIAGNOSIS — I4891 Unspecified atrial fibrillation: Secondary | ICD-10-CM | POA: Diagnosis not present

## 2023-02-24 DIAGNOSIS — J9611 Chronic respiratory failure with hypoxia: Secondary | ICD-10-CM | POA: Diagnosis not present

## 2023-02-24 DIAGNOSIS — I110 Hypertensive heart disease with heart failure: Secondary | ICD-10-CM | POA: Diagnosis not present

## 2023-02-24 DIAGNOSIS — I509 Heart failure, unspecified: Secondary | ICD-10-CM | POA: Diagnosis not present

## 2023-02-24 DIAGNOSIS — E785 Hyperlipidemia, unspecified: Secondary | ICD-10-CM | POA: Diagnosis not present

## 2023-02-24 DIAGNOSIS — I10 Essential (primary) hypertension: Secondary | ICD-10-CM | POA: Diagnosis not present

## 2023-02-24 DIAGNOSIS — E079 Disorder of thyroid, unspecified: Secondary | ICD-10-CM | POA: Diagnosis not present

## 2023-02-25 DIAGNOSIS — E785 Hyperlipidemia, unspecified: Secondary | ICD-10-CM | POA: Diagnosis not present

## 2023-02-25 DIAGNOSIS — I10 Essential (primary) hypertension: Secondary | ICD-10-CM | POA: Diagnosis not present

## 2023-02-25 DIAGNOSIS — K219 Gastro-esophageal reflux disease without esophagitis: Secondary | ICD-10-CM | POA: Diagnosis not present

## 2023-02-25 DIAGNOSIS — I482 Chronic atrial fibrillation, unspecified: Secondary | ICD-10-CM | POA: Diagnosis not present

## 2023-02-26 DIAGNOSIS — I5023 Acute on chronic systolic (congestive) heart failure: Secondary | ICD-10-CM | POA: Diagnosis not present

## 2023-02-28 DIAGNOSIS — S6392XA Sprain of unspecified part of left wrist and hand, initial encounter: Secondary | ICD-10-CM | POA: Diagnosis not present

## 2023-02-28 DIAGNOSIS — I110 Hypertensive heart disease with heart failure: Secondary | ICD-10-CM | POA: Diagnosis not present

## 2023-02-28 DIAGNOSIS — E119 Type 2 diabetes mellitus without complications: Secondary | ICD-10-CM | POA: Diagnosis not present

## 2023-02-28 DIAGNOSIS — W19XXXA Unspecified fall, initial encounter: Secondary | ICD-10-CM | POA: Diagnosis not present

## 2023-02-28 DIAGNOSIS — R58 Hemorrhage, not elsewhere classified: Secondary | ICD-10-CM | POA: Diagnosis not present

## 2023-02-28 DIAGNOSIS — Z66 Do not resuscitate: Secondary | ICD-10-CM | POA: Diagnosis not present

## 2023-02-28 DIAGNOSIS — I509 Heart failure, unspecified: Secondary | ICD-10-CM | POA: Diagnosis not present

## 2023-02-28 DIAGNOSIS — I501 Left ventricular failure: Secondary | ICD-10-CM | POA: Diagnosis not present

## 2023-02-28 DIAGNOSIS — J9602 Acute respiratory failure with hypercapnia: Secondary | ICD-10-CM | POA: Diagnosis not present

## 2023-02-28 DIAGNOSIS — N189 Chronic kidney disease, unspecified: Secondary | ICD-10-CM | POA: Diagnosis not present

## 2023-02-28 DIAGNOSIS — I5033 Acute on chronic diastolic (congestive) heart failure: Secondary | ICD-10-CM | POA: Diagnosis not present

## 2023-02-28 DIAGNOSIS — E875 Hyperkalemia: Secondary | ICD-10-CM | POA: Diagnosis not present

## 2023-02-28 DIAGNOSIS — R062 Wheezing: Secondary | ICD-10-CM | POA: Diagnosis not present

## 2023-02-28 DIAGNOSIS — S6992XA Unspecified injury of left wrist, hand and finger(s), initial encounter: Secondary | ICD-10-CM | POA: Diagnosis not present

## 2023-02-28 DIAGNOSIS — W228XXA Striking against or struck by other objects, initial encounter: Secondary | ICD-10-CM | POA: Diagnosis not present

## 2023-02-28 DIAGNOSIS — G894 Chronic pain syndrome: Secondary | ICD-10-CM | POA: Diagnosis not present

## 2023-02-28 DIAGNOSIS — J9601 Acute respiratory failure with hypoxia: Secondary | ICD-10-CM | POA: Diagnosis not present

## 2023-02-28 DIAGNOSIS — S81012A Laceration without foreign body, left knee, initial encounter: Secondary | ICD-10-CM | POA: Diagnosis not present

## 2023-02-28 DIAGNOSIS — Z950 Presence of cardiac pacemaker: Secondary | ICD-10-CM | POA: Diagnosis not present

## 2023-02-28 DIAGNOSIS — I517 Cardiomegaly: Secondary | ICD-10-CM | POA: Diagnosis not present

## 2023-02-28 DIAGNOSIS — I130 Hypertensive heart and chronic kidney disease with heart failure and stage 1 through stage 4 chronic kidney disease, or unspecified chronic kidney disease: Secondary | ICD-10-CM | POA: Diagnosis not present

## 2023-02-28 DIAGNOSIS — K219 Gastro-esophageal reflux disease without esophagitis: Secondary | ICD-10-CM | POA: Diagnosis not present

## 2023-02-28 DIAGNOSIS — N179 Acute kidney failure, unspecified: Secondary | ICD-10-CM | POA: Diagnosis not present

## 2023-02-28 DIAGNOSIS — R918 Other nonspecific abnormal finding of lung field: Secondary | ICD-10-CM | POA: Diagnosis not present

## 2023-02-28 DIAGNOSIS — E1122 Type 2 diabetes mellitus with diabetic chronic kidney disease: Secondary | ICD-10-CM | POA: Diagnosis not present

## 2023-02-28 DIAGNOSIS — Z9981 Dependence on supplemental oxygen: Secondary | ICD-10-CM | POA: Diagnosis not present

## 2023-02-28 DIAGNOSIS — R5383 Other fatigue: Secondary | ICD-10-CM | POA: Diagnosis not present

## 2023-02-28 DIAGNOSIS — Z515 Encounter for palliative care: Secondary | ICD-10-CM | POA: Diagnosis not present

## 2023-02-28 DIAGNOSIS — S66912A Strain of unspecified muscle, fascia and tendon at wrist and hand level, left hand, initial encounter: Secondary | ICD-10-CM | POA: Diagnosis not present

## 2023-02-28 DIAGNOSIS — S20219D Contusion of unspecified front wall of thorax, subsequent encounter: Secondary | ICD-10-CM | POA: Diagnosis not present

## 2023-02-28 DIAGNOSIS — Z743 Need for continuous supervision: Secondary | ICD-10-CM | POA: Diagnosis not present

## 2023-02-28 DIAGNOSIS — S0512XA Contusion of eyeball and orbital tissues, left eye, initial encounter: Secondary | ICD-10-CM | POA: Diagnosis not present

## 2023-02-28 DIAGNOSIS — I4891 Unspecified atrial fibrillation: Secondary | ICD-10-CM | POA: Diagnosis not present

## 2023-02-28 DIAGNOSIS — R0789 Other chest pain: Secondary | ICD-10-CM | POA: Diagnosis not present

## 2023-02-28 DIAGNOSIS — R079 Chest pain, unspecified: Secondary | ICD-10-CM | POA: Diagnosis not present

## 2023-02-28 DIAGNOSIS — S0990XA Unspecified injury of head, initial encounter: Secondary | ICD-10-CM | POA: Diagnosis not present

## 2023-02-28 DIAGNOSIS — J9809 Other diseases of bronchus, not elsewhere classified: Secondary | ICD-10-CM | POA: Diagnosis not present

## 2023-02-28 DIAGNOSIS — R54 Age-related physical debility: Secondary | ICD-10-CM | POA: Diagnosis not present

## 2023-02-28 DIAGNOSIS — E876 Hypokalemia: Secondary | ICD-10-CM | POA: Diagnosis not present

## 2023-02-28 DIAGNOSIS — W19XXXD Unspecified fall, subsequent encounter: Secondary | ICD-10-CM | POA: Diagnosis not present

## 2023-02-28 DIAGNOSIS — E079 Disorder of thyroid, unspecified: Secondary | ICD-10-CM | POA: Diagnosis not present

## 2023-02-28 DIAGNOSIS — Z79899 Other long term (current) drug therapy: Secondary | ICD-10-CM | POA: Diagnosis not present

## 2023-02-28 DIAGNOSIS — B370 Candidal stomatitis: Secondary | ICD-10-CM | POA: Diagnosis not present

## 2023-02-28 DIAGNOSIS — R6889 Other general symptoms and signs: Secondary | ICD-10-CM | POA: Diagnosis not present

## 2023-02-28 DIAGNOSIS — M7989 Other specified soft tissue disorders: Secondary | ICD-10-CM | POA: Diagnosis not present

## 2023-02-28 DIAGNOSIS — M25532 Pain in left wrist: Secondary | ICD-10-CM | POA: Diagnosis not present

## 2023-02-28 DIAGNOSIS — Z8679 Personal history of other diseases of the circulatory system: Secondary | ICD-10-CM | POA: Diagnosis not present

## 2023-02-28 DIAGNOSIS — Z881 Allergy status to other antibiotic agents status: Secondary | ICD-10-CM | POA: Diagnosis not present

## 2023-02-28 DIAGNOSIS — R609 Edema, unspecified: Secondary | ICD-10-CM | POA: Diagnosis not present

## 2023-02-28 DIAGNOSIS — S299XXA Unspecified injury of thorax, initial encounter: Secondary | ICD-10-CM | POA: Diagnosis not present

## 2023-03-02 DIAGNOSIS — S20219D Contusion of unspecified front wall of thorax, subsequent encounter: Secondary | ICD-10-CM | POA: Diagnosis not present

## 2023-03-02 DIAGNOSIS — W19XXXD Unspecified fall, subsequent encounter: Secondary | ICD-10-CM | POA: Diagnosis not present

## 2023-03-02 DIAGNOSIS — I4891 Unspecified atrial fibrillation: Secondary | ICD-10-CM | POA: Diagnosis not present

## 2023-03-02 DIAGNOSIS — N179 Acute kidney failure, unspecified: Secondary | ICD-10-CM | POA: Diagnosis not present

## 2023-03-02 DIAGNOSIS — R54 Age-related physical debility: Secondary | ICD-10-CM | POA: Diagnosis not present

## 2023-03-02 DIAGNOSIS — E875 Hyperkalemia: Secondary | ICD-10-CM | POA: Diagnosis not present

## 2023-03-03 DIAGNOSIS — Z8679 Personal history of other diseases of the circulatory system: Secondary | ICD-10-CM | POA: Diagnosis not present

## 2023-03-03 DIAGNOSIS — E875 Hyperkalemia: Secondary | ICD-10-CM | POA: Diagnosis not present

## 2023-03-03 DIAGNOSIS — I509 Heart failure, unspecified: Secondary | ICD-10-CM | POA: Diagnosis not present

## 2023-03-03 DIAGNOSIS — J9602 Acute respiratory failure with hypercapnia: Secondary | ICD-10-CM | POA: Diagnosis not present

## 2023-03-03 DIAGNOSIS — N189 Chronic kidney disease, unspecified: Secondary | ICD-10-CM | POA: Diagnosis not present

## 2023-03-04 DIAGNOSIS — I4891 Unspecified atrial fibrillation: Secondary | ICD-10-CM | POA: Diagnosis not present

## 2023-03-04 DIAGNOSIS — N189 Chronic kidney disease, unspecified: Secondary | ICD-10-CM | POA: Diagnosis not present

## 2023-03-04 DIAGNOSIS — E875 Hyperkalemia: Secondary | ICD-10-CM | POA: Diagnosis not present

## 2023-03-04 DIAGNOSIS — W19XXXD Unspecified fall, subsequent encounter: Secondary | ICD-10-CM | POA: Diagnosis not present

## 2023-03-04 DIAGNOSIS — J9602 Acute respiratory failure with hypercapnia: Secondary | ICD-10-CM | POA: Diagnosis not present

## 2023-03-05 DIAGNOSIS — N189 Chronic kidney disease, unspecified: Secondary | ICD-10-CM | POA: Diagnosis not present

## 2023-03-05 DIAGNOSIS — Z79899 Other long term (current) drug therapy: Secondary | ICD-10-CM | POA: Diagnosis not present

## 2023-03-05 DIAGNOSIS — W19XXXD Unspecified fall, subsequent encounter: Secondary | ICD-10-CM | POA: Diagnosis not present

## 2023-03-05 DIAGNOSIS — E876 Hypokalemia: Secondary | ICD-10-CM | POA: Diagnosis not present

## 2023-03-05 DIAGNOSIS — I4891 Unspecified atrial fibrillation: Secondary | ICD-10-CM | POA: Diagnosis not present

## 2023-03-05 DIAGNOSIS — J9602 Acute respiratory failure with hypercapnia: Secondary | ICD-10-CM | POA: Diagnosis not present

## 2023-03-06 DIAGNOSIS — W19XXXD Unspecified fall, subsequent encounter: Secondary | ICD-10-CM | POA: Diagnosis not present

## 2023-03-06 DIAGNOSIS — J9602 Acute respiratory failure with hypercapnia: Secondary | ICD-10-CM | POA: Diagnosis not present

## 2023-03-06 DIAGNOSIS — I4891 Unspecified atrial fibrillation: Secondary | ICD-10-CM | POA: Diagnosis not present

## 2023-03-06 DIAGNOSIS — Z9981 Dependence on supplemental oxygen: Secondary | ICD-10-CM | POA: Diagnosis not present

## 2023-03-06 DIAGNOSIS — N189 Chronic kidney disease, unspecified: Secondary | ICD-10-CM | POA: Diagnosis not present

## 2023-03-07 DIAGNOSIS — I5033 Acute on chronic diastolic (congestive) heart failure: Secondary | ICD-10-CM | POA: Diagnosis not present

## 2023-03-07 DIAGNOSIS — I110 Hypertensive heart disease with heart failure: Secondary | ICD-10-CM | POA: Diagnosis not present

## 2023-03-07 DIAGNOSIS — J9601 Acute respiratory failure with hypoxia: Secondary | ICD-10-CM | POA: Diagnosis not present

## 2023-03-07 DIAGNOSIS — I4891 Unspecified atrial fibrillation: Secondary | ICD-10-CM | POA: Diagnosis not present

## 2023-03-07 DIAGNOSIS — J9602 Acute respiratory failure with hypercapnia: Secondary | ICD-10-CM | POA: Diagnosis not present

## 2023-03-07 DIAGNOSIS — N179 Acute kidney failure, unspecified: Secondary | ICD-10-CM | POA: Diagnosis not present

## 2023-03-07 DIAGNOSIS — R54 Age-related physical debility: Secondary | ICD-10-CM | POA: Diagnosis not present

## 2023-03-07 DIAGNOSIS — E119 Type 2 diabetes mellitus without complications: Secondary | ICD-10-CM | POA: Diagnosis not present

## 2023-03-08 DIAGNOSIS — N1832 Chronic kidney disease, stage 3b: Secondary | ICD-10-CM | POA: Diagnosis not present

## 2023-03-08 DIAGNOSIS — I130 Hypertensive heart and chronic kidney disease with heart failure and stage 1 through stage 4 chronic kidney disease, or unspecified chronic kidney disease: Secondary | ICD-10-CM | POA: Diagnosis not present

## 2023-03-08 DIAGNOSIS — E1122 Type 2 diabetes mellitus with diabetic chronic kidney disease: Secondary | ICD-10-CM | POA: Diagnosis not present

## 2023-03-08 DIAGNOSIS — N172 Acute kidney failure with medullary necrosis: Secondary | ICD-10-CM | POA: Diagnosis not present

## 2023-03-08 DIAGNOSIS — I5033 Acute on chronic diastolic (congestive) heart failure: Secondary | ICD-10-CM | POA: Diagnosis not present

## 2023-03-10 DIAGNOSIS — I509 Heart failure, unspecified: Secondary | ICD-10-CM | POA: Diagnosis not present

## 2023-03-10 DIAGNOSIS — K5900 Constipation, unspecified: Secondary | ICD-10-CM | POA: Diagnosis not present

## 2023-03-10 DIAGNOSIS — M6281 Muscle weakness (generalized): Secondary | ICD-10-CM | POA: Diagnosis not present

## 2023-03-10 DIAGNOSIS — I110 Hypertensive heart disease with heart failure: Secondary | ICD-10-CM | POA: Diagnosis not present

## 2023-03-10 DIAGNOSIS — I4891 Unspecified atrial fibrillation: Secondary | ICD-10-CM | POA: Diagnosis not present

## 2023-03-10 DIAGNOSIS — J9611 Chronic respiratory failure with hypoxia: Secondary | ICD-10-CM | POA: Diagnosis not present

## 2023-03-15 ENCOUNTER — Telehealth: Payer: Self-pay

## 2023-03-15 ENCOUNTER — Other Ambulatory Visit: Payer: Self-pay

## 2023-03-15 DIAGNOSIS — I2510 Atherosclerotic heart disease of native coronary artery without angina pectoris: Secondary | ICD-10-CM

## 2023-03-15 NOTE — Patient Outreach (Signed)
[facility] office called with questions about assisted facility and sent over a faxed referral. There is already a referral open for care management RN and SW. I added faxed referral to media in pt chart.  Vanice Sarah [facility] Care Management Assistant [phone]

## 2023-03-18 DIAGNOSIS — I5033 Acute on chronic diastolic (congestive) heart failure: Secondary | ICD-10-CM | POA: Diagnosis not present

## 2023-03-18 DIAGNOSIS — E1122 Type 2 diabetes mellitus with diabetic chronic kidney disease: Secondary | ICD-10-CM | POA: Diagnosis not present

## 2023-03-18 DIAGNOSIS — N172 Acute kidney failure with medullary necrosis: Secondary | ICD-10-CM | POA: Diagnosis not present

## 2023-03-18 DIAGNOSIS — N1832 Chronic kidney disease, stage 3b: Secondary | ICD-10-CM | POA: Diagnosis not present

## 2023-03-18 DIAGNOSIS — I130 Hypertensive heart and chronic kidney disease with heart failure and stage 1 through stage 4 chronic kidney disease, or unspecified chronic kidney disease: Secondary | ICD-10-CM | POA: Diagnosis not present

## 2023-03-21 ENCOUNTER — Telehealth: Payer: Self-pay | Admitting: *Deleted

## 2023-03-21 DIAGNOSIS — G894 Chronic pain syndrome: Secondary | ICD-10-CM | POA: Diagnosis not present

## 2023-03-21 DIAGNOSIS — M6281 Muscle weakness (generalized): Secondary | ICD-10-CM | POA: Diagnosis not present

## 2023-03-21 NOTE — Progress Notes (Unsigned)
  Care Coordination  Outreach Note  03/21/2023 Name: Melanie [name] MRN: [id number] DOB: October 31, 1925   Care Coordination Outreach Attempts: An unsuccessful telephone outreach was attempted today to offer the patient information about available care coordination services.  Follow Up Plan:  Additional outreach attempts will be made to offer the patient care coordination information and services.   Encounter Outcome:  No Answer  Christie Nottingham  Care Coordination Care Guide  Direct Dial: [phone]

## 2023-03-22 NOTE — Progress Notes (Unsigned)
  Care Coordination  Outreach Note  03/22/2023 Name: Melanie [name] MRN: [id number] DOB: 03/20/1926   Care Coordination Outreach Attempts: A second unsuccessful outreach was attempted today to offer the patient with information about available care coordination services.  Follow Up Plan:  Additional outreach attempts will be made to offer the patient care coordination information and services.   Encounter Outcome:  No Answer  Christie Nottingham  Care Coordination Care Guide  Direct Dial: [phone]

## 2023-03-23 ENCOUNTER — Ambulatory Visit: Payer: 59

## 2023-03-23 NOTE — Patient Outreach (Signed)
  Care Coordination   Initial Visit Note   03/23/2023 Name: Melanie [name] MRN: [id number] DOB: 1926/04/20  Melanie [name] is a 89 y.o. year old female who sees [facility] for primary care. I spoke with  Melanie [name] POA/sister Melanie [name] by phone today.  What matters to the patients health and wellness today?  Melanie [name] feels that patient is not receiving the best care at [facility].  Melanie [name] reports that she has to feed, change and monitor patient while in the facility.  Patient is not able to swallow and is not being fed.  Melanie [name] declines to contact an Ombudsman to file a complaint.  Melanie [name] wants patient transferred to a SNF and agreed to continue to monitor patient until the transfer is completed.  Melanie [name] declines authorization for SW to speak to staff at [facility] at this time.  Melanie [name] is undecided at the moment on [facility], but agreed to allow SW to speak to [facility] the Nurse Case Manager.    Goals Addressed             This Visit's Progress    Better quality of care       -Patients sister feels ALF is not appropriate for patients level of need -Patients sister request to transfer patient into a SNF -Patient needs FL2 completed  -Patient needs options for available SNF beds        SDOH assessments and interventions completed:  Yes    Interventions Today    Flowsheet Row Most Recent Value  Chronic Disease   Chronic disease during today's visit Hypertension (HTN), Other  General Interventions   General Interventions Discussed/Reviewed General Interventions Discussed  [Sister wants patient moved into a SNF, confusion about what to do to complete the move]        Care Coordination Interventions:  Yes, provided  Care Coordination Interventions: SW agreed to communicate with [facility] and Dr. Raquel Sarna on options SW explained FL-2   Active listening / Reflection utilized  Emotional Support  Provided  Follow up plan:  SW will follow up once more information has been obtained.    Encounter Outcome:  Pt. Visit Completed

## 2023-03-23 NOTE — Patient Instructions (Signed)
Visit Information  Thank you for taking time to visit with me today. Please don't hesitate to contact me if I can be of assistance to you.   Following are the goals we discussed today:   Goals Addressed             This Visit's Progress    Better quality of care       -Patients sister feels ALF is not appropriate for patients level of need -Patients sister request to transfer patient into a SNF -Patient needs FL2 completed  -Patient needs options for available SNF beds        If you are experiencing a Mental Health or Behavioral Health Crisis or need someone to talk to, please call 911  Patient verbalizes understanding of instructions and care plan provided today and agrees to view in MyChart. Active MyChart status and patient understanding of how to access instructions and care plan via MyChart confirmed with patient.     Follow up will be made once more information is obtained. Lysle Morales, BSW Social Worker [facility] Care Management  [phone]

## 2023-03-23 NOTE — Progress Notes (Signed)
  Care Coordination   Note   03/23/2023 Name: Dakiya Puopolo MRN: [id number] DOB: 08/21/1926  Braidyn Peace is a 89 y.o. year old female who sees [facility] for primary care. I reached out to Waldon Merl by phone today to offer care coordination services.  Ms. Zwilling was given information about Care Coordination services today including:   The Care Coordination services include support from the care team which includes your Nurse Coordinator, Clinical Social Worker, or Pharmacist.  The Care Coordination team is here to help remove barriers to the health concerns and goals most important to you. Care Coordination services are voluntary, and the patient may decline or stop services at any time by request to their care team member.   Care Coordination Consent Status: Patient sister Thayer Dallas DPR on file  agreed to services and verbal consent obtained.   Follow up plan:  Telephone appointment with care coordination team member scheduled for:  03/23/23  Encounter Outcome:  Pt. Scheduled  [facility] Coordination Care Guide  Direct Dial: [phone]

## 2023-03-23 NOTE — Progress Notes (Signed)
  Care Coordination  Outreach Note  03/23/2023 Name: Shaquitta Burbridge MRN: [id number] DOB: 06/23/1926   Care Coordination Outreach Attempts: A third unsuccessful outreach was attempted today to offer the patient with information about available care coordination services.  Follow Up Plan:  No further outreach attempts will be made at this time. We have been unable to contact the patient to offer or enroll patient in care coordination services  Encounter Outcome:  No Answer  Christie Nottingham  Care Coordination Care Guide  Direct Dial: [phone]

## 2023-03-25 ENCOUNTER — Encounter: Payer: Self-pay | Admitting: *Deleted

## 2023-03-28 DEATH — deceased

## 2023-04-01 ENCOUNTER — Encounter: Payer: 59 | Admitting: *Deleted

## 2023-04-01 ENCOUNTER — Encounter: Payer: Medicare Other | Admitting: Internal Medicine

## 2023-04-26 ENCOUNTER — Ambulatory Visit: Payer: 59 | Admitting: Nurse Practitioner

## 2023-04-29 ENCOUNTER — Encounter: Payer: Medicare Other | Admitting: Cardiovascular Disease
